# Patient Record
Sex: Female | Born: 1955
Health system: Southern US, Community
[De-identification: ages and names within clinical notes are randomized; demographics above are authoritative.]

## PROBLEM LIST (undated history)

## (undated) DIAGNOSIS — J40 Bronchitis, not specified as acute or chronic: Secondary | ICD-10-CM

## (undated) DIAGNOSIS — I10 Essential (primary) hypertension: Secondary | ICD-10-CM

## (undated) DIAGNOSIS — C3490 Malignant neoplasm of unspecified part of unspecified bronchus or lung: Secondary | ICD-10-CM

## (undated) DIAGNOSIS — M19042 Primary osteoarthritis, left hand: Secondary | ICD-10-CM

---

## 2001-12-25 ENCOUNTER — Ambulatory Visit (HOSPITAL_COMMUNITY): Admission: RE | Admit: 2001-12-25 | Discharge: 2001-12-25 | Payer: Self-pay | Admitting: Obstetrics and Gynecology

## 2001-12-25 ENCOUNTER — Encounter: Payer: Self-pay | Admitting: Obstetrics and Gynecology

## 2001-12-27 ENCOUNTER — Encounter: Admission: RE | Admit: 2001-12-27 | Discharge: 2001-12-27 | Payer: Self-pay | Admitting: Obstetrics and Gynecology

## 2001-12-27 ENCOUNTER — Encounter: Payer: Self-pay | Admitting: Obstetrics and Gynecology

## 2002-01-23 ENCOUNTER — Other Ambulatory Visit: Admission: RE | Admit: 2002-01-23 | Discharge: 2002-01-23 | Payer: Self-pay | Admitting: Obstetrics and Gynecology

## 2003-02-20 ENCOUNTER — Other Ambulatory Visit: Admission: RE | Admit: 2003-02-20 | Discharge: 2003-02-20 | Payer: Self-pay | Admitting: Obstetrics and Gynecology

## 2003-04-28 ENCOUNTER — Encounter: Admission: RE | Admit: 2003-04-28 | Discharge: 2003-04-28 | Payer: Self-pay | Admitting: Obstetrics and Gynecology

## 2004-06-16 ENCOUNTER — Other Ambulatory Visit: Admission: RE | Admit: 2004-06-16 | Discharge: 2004-06-16 | Payer: Self-pay | Admitting: Obstetrics and Gynecology

## 2004-06-17 ENCOUNTER — Ambulatory Visit: Payer: Self-pay | Admitting: Family Medicine

## 2004-06-24 ENCOUNTER — Ambulatory Visit: Payer: Self-pay | Admitting: Family Medicine

## 2004-08-06 ENCOUNTER — Encounter: Admission: RE | Admit: 2004-08-06 | Discharge: 2004-08-06 | Payer: Self-pay | Admitting: Obstetrics and Gynecology

## 2005-07-04 ENCOUNTER — Other Ambulatory Visit: Admission: RE | Admit: 2005-07-04 | Discharge: 2005-07-04 | Payer: Self-pay | Admitting: Obstetrics and Gynecology

## 2005-07-21 ENCOUNTER — Ambulatory Visit: Payer: Self-pay | Admitting: Cardiology

## 2005-08-02 ENCOUNTER — Ambulatory Visit: Payer: Self-pay | Admitting: Cardiology

## 2005-09-15 ENCOUNTER — Ambulatory Visit: Payer: Self-pay | Admitting: Cardiology

## 2005-11-15 ENCOUNTER — Ambulatory Visit: Payer: Self-pay | Admitting: Cardiology

## 2008-05-29 ENCOUNTER — Ambulatory Visit: Payer: Self-pay | Admitting: Cardiology

## 2008-05-29 LAB — CONVERTED CEMR LAB
ALT: 20 units/L (ref 0–35)
AST: 24 units/L (ref 0–37)
Albumin: 4 g/dL (ref 3.5–5.2)
Alkaline Phosphatase: 95 units/L (ref 39–117)
BUN: 13 mg/dL (ref 6–23)
Bilirubin, Direct: 0.1 mg/dL (ref 0.0–0.3)
CO2: 0 meq/L — CL (ref 19–32)
Calcium: 9.7 mg/dL (ref 8.4–10.5)
Chloride: 103 meq/L (ref 96–112)
Cholesterol: 266 mg/dL (ref 0–200)
Creatinine, Ser: 0.6 mg/dL (ref 0.4–1.2)
Direct LDL: 130.1 mg/dL
GFR calc Af Amer: 135 mL/min
GFR calc non Af Amer: 112 mL/min
Glucose, Bld: 98 mg/dL (ref 70–99)
HDL: 120.2 mg/dL (ref >39.0)
Potassium: 4.1 meq/L (ref 3.5–5.1)
Sodium: 140 meq/L (ref 135–145)
TSH: 1.07 microintl units/mL (ref 0.35–5.50)
Total Bilirubin: 0.6 mg/dL (ref 0.3–1.2)
Total CHOL/HDL Ratio: 2.2
Total Protein: 7.3 g/dL (ref 6.0–8.3)
Triglycerides: 70 mg/dL (ref 0–149)
VLDL: 14 mg/dL (ref 0–40)

## 2008-08-07 ENCOUNTER — Telehealth (INDEPENDENT_AMBULATORY_CARE_PROVIDER_SITE_OTHER): Payer: Self-pay | Admitting: *Deleted

## 2008-11-12 ENCOUNTER — Encounter: Payer: Self-pay | Admitting: Cardiology

## 2008-11-20 DIAGNOSIS — F17200 Nicotine dependence, unspecified, uncomplicated: Secondary | ICD-10-CM | POA: Insufficient documentation

## 2009-04-08 ENCOUNTER — Encounter (INDEPENDENT_AMBULATORY_CARE_PROVIDER_SITE_OTHER): Payer: Self-pay | Admitting: *Deleted

## 2009-04-08 DIAGNOSIS — E7800 Pure hypercholesterolemia, unspecified: Secondary | ICD-10-CM | POA: Insufficient documentation

## 2009-04-08 DIAGNOSIS — I10 Essential (primary) hypertension: Secondary | ICD-10-CM | POA: Insufficient documentation

## 2009-04-14 ENCOUNTER — Ambulatory Visit: Payer: Self-pay | Admitting: Cardiology

## 2010-05-25 NOTE — Progress Notes (Signed)
   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

## 2010-05-25 NOTE — Letter (Signed)
Summary: [facility] Note  [facility] Note   Imported By: Roderic Ovens 12/11/2008 12:10:18  _____________________________________________________________________  External Attachment:    Type:   Image     Comment:   External Document  Appended Document: [facility] Note  Reviewed Juanito Doom, MD

## 2010-09-07 NOTE — Assessment & Plan Note (Signed)
[facility]                            CARDIOLOGY OFFICE NOTE   [name], [name]                        MRN:          [id number]  DATE:05/29/2008                            DOB:          09/21/1955    [name] returns today for followup.   CANCELED DICTATION.     Thomas C. Daleen Squibb, MD, [facility]     TCW/MedQ  DD: 05/29/2008  DT: 05/30/2008  Job #: [id number]

## 2010-09-07 NOTE — Assessment & Plan Note (Signed)
[facility]                            CARDIOLOGY OFFICE NOTE   EDITH, GROLEAU                        MRN:          [id number]  DATE:05/29/2008                            DOB:          1955-12-01    Ms. Fujikawa returns to the office today for followup.  The last time I  saw her was on November 15, 2005.   She was seen initially by Dr. Arelia Sneddon at [facility] for  hypertension and cardiovascular risk factors.   She says that her blood pressure usually run around 120/80 by her  husband who checks it with a manual cuff.  Unfortunately every time she  comes in here and goes into the office, it is elevated.  I am concerned  that other stresses in her life are doing the same thing.   She continues to smoke about a half a pack of cigarettes a day.  She  does stay active walking and fishing.   She also has hyperlipidemia though her HDL is very high at 91.  Her  total cholesterol in the past has been 261, triglycerides were normal,  LDL 151.  With her total to HDL ratio being so low, I chose not to treat  her back in 2007.   He is currently on calcium each day, multivitamin daily, Norvasc 10 mg  per day.   She had an intolerance to HYDROCHLOROTHIAZIDE causing back pain and  cramping in the past.  This was combined with lisinopril which I doubt  was the culprit.  She may need this in the future.   PHYSICAL EXAMINATION:  VITAL SIGNS:  Her blood pressure is 170/102 in  the left arm.  I rechecked it and it was 160 both by auscultation and by  palpation.  Her diastolic is running about 80-90.  Her weight is 136.  Heart rate is 68.  EKG is normal.  HEENT:  Sclerae are injected.  She looks tired.  She has a tan from a  tanning bed.  Rest of her HEENT is unremarkable.  NECK:  Carotid upstrokes were equal bilaterally without bruits.  Thyroid  is not enlarged.  Trachea is midline.  NECK:  Supple.  CHEST:  Lungs are clear to auscultation and  percussion.  HEART:  Normal S1 and S2.  PMI is nondisplaced.  ABDOMEN:  Soft.  No midline bruit.  No pulsatile mass.  EXTREMITIES:  There were no cyanosis, clubbing, or edema.  Pulses are  present.  NEUROLOGIC:  Intact.  SKIN:  Unremarkable.   I have had a long talk with Ms. Scheiderer today.  I am very concerned that  she has labile hypertension and that everyday stresses are causing a  significant variation in her systolic pressure not to mention her  diastolic pressure.  She is on Norvasc 10 mg a day and seems to be  compliant.  She took it this morning.  I have also talked to her at  length about how nicotine will drive up her blood pressure acutely and  she really needs to stop smoking.  I spent at  least 5-10 minutes on  this.   We will repeat her lipids, TSH, as well as a CMP.  If her HDL is still  very high, she is blessed and we will not treat her.   She will call me if her blood pressure is running consistently above 130-  140 systolic or greater than 90 diastolic.  I will plan on seeing her  back again in a year.     Thomas C. Daleen Squibb, MD, [facility]  Electronically Signed    TCW/MedQ  DD: 05/29/2008  DT: 05/29/2008  Job #: [id number]   cc:   Juluis Mire, M.D.

## 2010-09-10 NOTE — Assessment & Plan Note (Signed)
[facility]                              CARDIOLOGY OFFICE NOTE   EMUNAH, TEXIDOR                        MRN:          [id number]  DATE:11/15/2005                            DOB:          16-Apr-1956    Ms. Verdis Frederickson returns today for management of her hypertension.  Her blood  pressure has been running about 130/80.  She just got back from [facility] for 2 weeks.  She has been smoking a little bit, but has cut way  back.   She is on Norvasc or amlodipine 10 mg p.o. q. day.  She was intolerant of  the diuretic because of back pain and cramping.  It was probably the  diuretic component.   PHYSICAL EXAMINATION:  Her blood pressure is 140/90, pulse is 63, weight is  139.  The rest of the exam is unchanged.  She has no edema.   I have asked Ms. Lipton to continue with the Norvasc 10 mg a day.  I wrote  her a prescription for a year's worth of refills.  She checks her blood  pressure, and I told her that her goal was 130/80.  If it starts to  increase, she is to let us know.  We will see her back in a year.                               Thomas C. Daleen Squibb, MD, [facility]    TCW/MedQ  DD:  11/15/2005  DT:  11/16/2005  Job #:  [id number]   cc:   Juluis Mire, MD

## 2011-01-20 ENCOUNTER — Other Ambulatory Visit: Payer: Self-pay | Admitting: *Deleted

## 2011-01-20 MED ORDER — AMLODIPINE BESYLATE 10 MG PO TABS: 10.0000 mg | ORAL_TABLET | Freq: Every day | ORAL | Status: DC

## 2011-02-23 ENCOUNTER — Other Ambulatory Visit: Payer: Self-pay | Admitting: Cardiology

## 2011-08-07 ENCOUNTER — Emergency Department (HOSPITAL_BASED_OUTPATIENT_CLINIC_OR_DEPARTMENT_OTHER)
Admission: EM | Admit: 2011-08-07 | Discharge: 2011-08-07 | Disposition: A | Discharge status: Home / Self Care | Payer: BC Managed Care – PPO | Attending: Emergency Medicine | Admitting: Emergency Medicine

## 2011-08-07 ENCOUNTER — Encounter (HOSPITAL_BASED_OUTPATIENT_CLINIC_OR_DEPARTMENT_OTHER): Payer: Self-pay | Admitting: *Deleted

## 2011-08-07 DIAGNOSIS — J029 Acute pharyngitis, unspecified: Secondary | ICD-10-CM

## 2011-08-07 DIAGNOSIS — I10 Essential (primary) hypertension: Secondary | ICD-10-CM | POA: Insufficient documentation

## 2011-08-07 DIAGNOSIS — R070 Pain in throat: Secondary | ICD-10-CM | POA: Insufficient documentation

## 2011-08-07 DIAGNOSIS — F17200 Nicotine dependence, unspecified, uncomplicated: Secondary | ICD-10-CM | POA: Insufficient documentation

## 2011-08-07 HISTORY — DX: Essential (primary) hypertension: I10

## 2011-08-07 LAB — RAPID STREP SCREEN (MED CTR MEBANE ONLY): Streptococcus, Group A Screen (Direct): NEGATIVE

## 2011-08-07 MED ORDER — OXYCODONE-ACETAMINOPHEN 5-325 MG PO TABS
1.0000 | ORAL_TABLET | ORAL | Status: AC | PRN
Start: 2011-08-07 — End: 2011-08-17

## 2011-08-07 NOTE — Discharge Instructions (Signed)
Antibiotic Nonuse  Your caregiver felt that the infection or problem was not one that would be helped with an antibiotic. Infections may be caused by viruses or bacteria. Only a caregiver can tell which one of these is the likely cause of an illness. A cold is the most common cause of infection in both adults and children. A cold is a virus. Antibiotic treatment will have no effect on a viral infection. Viruses can lead to many lost days of work caring for sick children and many missed days of school. Children may catch as many as 10 "colds" or "flus" per year during which they can be tearful, cranky, and uncomfortable. The goal of treating a virus is aimed at keeping the ill person comfortable. Antibiotics are medications used to help the body fight bacterial infections. There are relatively few types of bacteria that cause infections but there are hundreds of viruses. While both viruses and bacteria cause infection they are very different types of germs. A viral infection will typically go away by itself within 7 to 10 days. Bacterial infections may spread or get worse without antibiotic treatment. Examples of bacterial infections are:  Sore throats (like strep throat or tonsillitis).   Infection in the lung (pneumonia).   Ear and skin infections.  Examples of viral infections are:  Colds or flus.   Most coughs and bronchitis.   Sore throats not caused by Strep.   Runny noses.  It is often best not to take an antibiotic when a viral infection is the cause of the problem. Antibiotics can kill off the helpful bacteria that we have inside our body and allow harmful bacteria to start growing. Antibiotics can cause side effects such as allergies, nausea, and diarrhea without helping to improve the symptoms of the viral infection. Additionally, repeated uses of antibiotics can cause bacteria inside of our body to become resistant. That resistance can be passed onto harmful bacterial. The next time  you have an infection it may be harder to treat if antibiotics are used when they are not needed. Not treating with antibiotics allows our own immune system to develop and take care of infections more efficiently. Also, antibiotics will work better for [place] when they are prescribed for bacterial infections. Treatments for a child that is ill may include:  Give extra fluids throughout the day to stay hydrated.   Get plenty of rest.   Only give your child over-the-counter or prescription medicines for pain, discomfort, or fever as directed by your caregiver.   The use of a cool mist humidifier may help stuffy noses.   Cold medications if suggested by your caregiver.  Your caregiver may decide to start you on an antibiotic if:  The problem you were seen for today continues for a longer length of time than expected.   You develop a secondary bacterial infection.  SEEK MEDICAL CARE IF:  Fever lasts longer than 5 days.   Symptoms continue to get worse after 5 to 7 days or become severe.   Difficulty in breathing develops.   Signs of dehydration develop (poor drinking, rare urinating, dark colored urine).   Changes in behavior or worsening tiredness (listlessness or lethargy).  Document Released: 06/20/2001 Document Revised: 03/31/2011 Document Reviewed: 12/17/2008 [facility] Patient Information 2012 [place], [place].Sore Throat Sore throats may be caused by bacteria and viruses. They may also be caused by:  Smoking.   Pollution.   Allergies.  If a sore throat is due to strep infection (a bacterial infection),  you may need:  A throat swab.   A culture test to verify the strep infection.  You will need one of these:  An antibiotic shot.   Oral medicine for a full 10 days.  Strep infection is very contagious. A doctor should check any close contacts who have a sore throat or fever. A sore throat caused by a virus infection will usually last only 3-4 days. Antibiotics will not treat a  viral sore throat.  Infectious mononucleosis (a viral disease), however, can cause a sore throat that lasts for up to 3 weeks. Mononucleosis can be diagnosed with blood tests. You must have been sick for at least 1 week in order for the test to give accurate results. HOME CARE INSTRUCTIONS   To treat a sore throat, take mild pain medicine.   Increase your fluids.   Eat a soft diet.   Do not smoke.   Gargling with warm water or salt water (1 tsp. salt in 8 oz. water) can be helpful.   Try throat sprays or lozenges or sucking on hard candy to ease the symptoms.  Call your doctor if your sore throat lasts longer than 1 week.  SEEK IMMEDIATE MEDICAL CARE IF:  You have difficulty breathing.   You have increased swelling in the throat.   You have pain so severe that you are unable to swallow fluids or your saliva.   You have a severe headache, a high fever, vomiting, or a red rash.  Document Released: 05/19/2004 Document Revised: 03/31/2011 Document Reviewed: 03/29/2007 [facility] Patient Information 2012 [place], [place].

## 2011-08-07 NOTE — ED Notes (Signed)
Patient states that she has had a sore throat since last night.

## 2011-08-07 NOTE — ED Provider Notes (Signed)
History     CSN: [id number]  Arrival date & time 08/07/11  5284   First MD Initiated Contact with Patient 08/07/11 [phone]      Chief Complaint  Patient presents with  . Sore Throat    (Consider location/radiation/quality/duration/timing/severity/associated sxs/prior treatment) HPI Comments: Patient with sore throat for several days.  Subjective fever yesterday.  Some congest she thinks is secondary to seasonal allergies.  No cough, vomiting or diarrhea.    Patient is a 56 y.o. female presenting with pharyngitis. The history is provided by the patient.  Sore Throat This is a new problem. The current episode started 2 days ago. The problem occurs constantly. The problem has not changed since onset.Pertinent negatives include no chest pain, no abdominal pain, no headaches and no shortness of breath. The symptoms are aggravated by swallowing. The symptoms are relieved by nothing. The treatment provided no relief.    Past Medical History  Diagnosis Date  . Hypertension     History reviewed. No pertinent past surgical history.  No family history on file.  History  Substance Use Topics  . Smoking status: Current Everyday Smoker  . Smokeless tobacco: Not on file  . Alcohol Use:     OB History    Grav Para Term Preterm Abortions TAB SAB Ect Mult Living                  Review of Systems  Respiratory: Negative for shortness of breath.   Cardiovascular: Negative for chest pain.  Gastrointestinal: Negative for abdominal pain.  Neurological: Negative for headaches.  All other systems reviewed and are negative.    Allergies  Codeine; Hydrochlorothiazide; and Lisinopril  Home Medications   Current Outpatient Rx  Name Route Sig Dispense Refill  . AMLODIPINE BESYLATE 10 MG PO TABS  TAKE 1 TABLET (10 MG TOTAL) BY MOUTH DAILY. 30 tablet 11    BP 156/89  Pulse 84  Temp(Src) 98.3 F (36.8 C) (Oral)  Resp 18  SpO2 98%  Physical Exam  Nursing note and vitals  reviewed. Constitutional: She is oriented to person, place, and time. She appears well-developed and well-nourished.  HENT:  Head: Normocephalic and atraumatic.  Right Ear: External ear normal.  Left Ear: External ear normal.  Nose: Nose normal.       Moderate erythema of pharynx, no swelling or exudate  Eyes: Conjunctivae and EOM are normal. Pupils are equal, round, and reactive to light.  Neck: Normal range of motion. Neck supple.  Cardiovascular: Normal rate, regular rhythm, normal heart sounds and intact distal pulses.   Pulmonary/Chest: Effort normal and breath sounds normal.  Abdominal: Soft. Bowel sounds are normal.  Musculoskeletal: Normal range of motion.  Neurological: She is alert and oriented to person, place, and time. She has normal reflexes.  Skin: Skin is warm and dry.  Psychiatric: She has a normal mood and affect. Her behavior is normal. Thought content normal.    ED Course  Procedures (including critical care time)   Labs Reviewed  RAPID STREP SCREEN   No results found.   No diagnosis found.    MDM  Strep negative.  Patient states she can take percocet.  She will be given rx for strep-discussed that she is able to take percocet but not codeine.       Hilario Quarry, MD 08/07/11 [phone]

## 2012-04-13 ENCOUNTER — Other Ambulatory Visit: Payer: Self-pay

## 2012-05-19 ENCOUNTER — Emergency Department (HOSPITAL_BASED_OUTPATIENT_CLINIC_OR_DEPARTMENT_OTHER)
Admission: EM | Admit: 2012-05-19 | Discharge: 2012-05-19 | Disposition: A | Discharge status: Home / Self Care | Payer: BC Managed Care – PPO | Attending: Emergency Medicine | Admitting: Emergency Medicine

## 2012-05-19 ENCOUNTER — Encounter (HOSPITAL_BASED_OUTPATIENT_CLINIC_OR_DEPARTMENT_OTHER): Payer: Self-pay | Admitting: *Deleted

## 2012-05-19 ENCOUNTER — Emergency Department (HOSPITAL_BASED_OUTPATIENT_CLINIC_OR_DEPARTMENT_OTHER): Payer: BC Managed Care – PPO

## 2012-05-19 DIAGNOSIS — Z79899 Other long term (current) drug therapy: Secondary | ICD-10-CM | POA: Insufficient documentation

## 2012-05-19 DIAGNOSIS — M7918 Myalgia, other site: Secondary | ICD-10-CM

## 2012-05-19 DIAGNOSIS — Y9389 Activity, other specified: Secondary | ICD-10-CM | POA: Insufficient documentation

## 2012-05-19 DIAGNOSIS — S298XXA Other specified injuries of thorax, initial encounter: Secondary | ICD-10-CM | POA: Insufficient documentation

## 2012-05-19 DIAGNOSIS — I10 Essential (primary) hypertension: Secondary | ICD-10-CM | POA: Insufficient documentation

## 2012-05-19 DIAGNOSIS — F17200 Nicotine dependence, unspecified, uncomplicated: Secondary | ICD-10-CM | POA: Insufficient documentation

## 2012-05-19 DIAGNOSIS — IMO0002 Reserved for concepts with insufficient information to code with codable children: Secondary | ICD-10-CM | POA: Insufficient documentation

## 2012-05-19 DIAGNOSIS — Y929 Unspecified place or not applicable: Secondary | ICD-10-CM | POA: Insufficient documentation

## 2012-05-19 MED ORDER — DIAZEPAM 5 MG PO TABS: 5.0000 mg | ORAL_TABLET | Freq: Two times a day (BID) | ORAL | Status: DC

## 2012-05-19 MED ORDER — CYCLOBENZAPRINE HCL 10 MG PO TABS: 10.0000 mg | ORAL_TABLET | Freq: Two times a day (BID) | ORAL | Status: DC | PRN

## 2012-05-19 MED ORDER — KETOROLAC TROMETHAMINE 60 MG/2ML IM SOLN
60.0000 mg | Freq: Once | INTRAMUSCULAR | Status: AC
  Administered 2012-05-19: 60 mg via INTRAMUSCULAR
  Filled 2012-05-19: qty 2

## 2012-05-19 NOTE — ED Provider Notes (Signed)
History/physical exam/procedure(s) were performed by non-physician practitioner and as supervising physician I was immediately available for consultation/collaboration. I have reviewed all notes and am in agreement with care and plan.   Elo Marmolejos S Nell Schrack, MD 05/19/12 1509 

## 2012-05-19 NOTE — ED Notes (Signed)
Patient transported to X-ray 

## 2012-05-19 NOTE — ED Notes (Signed)
Patient fell Tuesday and hit the trash can catching her in the ribs in her right side. Not feeling any better.

## 2012-05-19 NOTE — ED Provider Notes (Signed)
History     CSN: [id number]  Arrival date & time 05/19/12  1149   First MD Initiated Contact with Patient 05/19/12 1205      Chief Complaint  Patient presents with  . Muscle Pain    (Consider location/radiation/quality/duration/timing/severity/associated sxs/prior treatment) HPI Comments: Patient is a 57 year old female who presents with right rib pain for the past 5 days. The pain started suddenly after leaning into an industrial trash can to get something she accidentally threw away. She felt her right ribs "catch" on the edge of the trash can. She reports aching and severe pain that does not radiate. She has tried ibuprofen for pain which does no provide any relief. Pain is worse with movement and palpation. No associated SOB or chest pain.    Past Medical History  Diagnosis Date  . Hypertension     History reviewed. No pertinent past surgical history.  No family history on file.  History  Substance Use Topics  . Smoking status: Current Every Day Smoker  . Smokeless tobacco: Not on file  . Alcohol Use: No    OB History    Grav Para Term Preterm Abortions TAB SAB Ect Mult Living                  Review of Systems  Musculoskeletal: Positive for myalgias.  All other systems reviewed and are negative.    Allergies  Codeine; Hydrochlorothiazide; and Lisinopril  Home Medications   Current Outpatient Rx  Name  Route  Sig  Dispense  Refill  . AMLODIPINE BESYLATE 10 MG PO TABS      TAKE 1 TABLET (10 MG TOTAL) BY MOUTH DAILY.   30 tablet   11     BP 161/102  Pulse 73  Temp 98.1 F (36.7 C) (Oral)  Resp 18  Ht 5\' 6"  (1.676 m)  Wt 140 lb (63.504 kg)  BMI 22.60 kg/m2  SpO2 100%  Physical Exam  Nursing note and vitals reviewed. Constitutional: She is oriented to person, place, and time. She appears well-developed and well-nourished. No distress.  HENT:  Head: Normocephalic and atraumatic.  Eyes: Conjunctivae normal and EOM are normal. Pupils are equal,  round, and reactive to light.  Neck: Normal range of motion. Neck supple.  Cardiovascular: Normal rate and regular rhythm.  Exam reveals no gallop and no friction rub.   No murmur heard. Pulmonary/Chest: Effort normal and breath sounds normal. She has no wheezes. She has no rales. She exhibits no tenderness.  Abdominal: Soft. There is no tenderness.  Musculoskeletal: Normal range of motion.       Tender to palpation of anterior right lower ribs.   Neurological: She is alert and oriented to person, place, and time.       Speech is goal-oriented. Moves limbs without ataxia.   Skin: Skin is warm and dry.  Psychiatric: She has a normal mood and affect. Her behavior is normal.    ED Course  Procedures (including critical care time)  Labs Reviewed - No data to display Dg Ribs Unilateral W/chest Right  05/19/2012  *RADIOLOGY REPORT*  Clinical Data: Pain post fall  RIGHT RIBS AND CHEST - 3+ VIEW  Comparison: None.  Findings: Three views right ribs submitted.  The cardiomediastinal silhouette is unremarkable.  No acute infiltrate or pulmonary edema.  No right rib fracture is identified.  No diagnostic pneumothorax.  IMPRESSION: .  No right rib fracture.  No diagnostic pneumothorax.   Original Report Authenticated By: Lang Snow  Pop, M.D.      1. Musculoskeletal pain       MDM  12:42 PM Chest xray pending. Patient will have toradol for pain here.   1:41 PM Xray unremarkale. Patient likely has musculoskeletal pain. I will discharge her with Flexeril and valium. No further evaluation needed at this time. Vitals stable for discharge.     Emilia Beck, PA-C 05/19/12 1431

## 2013-01-07 ENCOUNTER — Encounter (HOSPITAL_BASED_OUTPATIENT_CLINIC_OR_DEPARTMENT_OTHER): Payer: Self-pay | Admitting: *Deleted

## 2013-01-07 ENCOUNTER — Emergency Department (HOSPITAL_BASED_OUTPATIENT_CLINIC_OR_DEPARTMENT_OTHER)
Admission: EM | Admit: 2013-01-07 | Discharge: 2013-01-07 | Disposition: A | Discharge status: Home / Self Care | Payer: BC Managed Care – PPO | Attending: Emergency Medicine | Admitting: Emergency Medicine

## 2013-01-07 ENCOUNTER — Emergency Department (HOSPITAL_BASED_OUTPATIENT_CLINIC_OR_DEPARTMENT_OTHER): Payer: BC Managed Care – PPO

## 2013-01-07 DIAGNOSIS — I10 Essential (primary) hypertension: Secondary | ICD-10-CM | POA: Insufficient documentation

## 2013-01-07 DIAGNOSIS — R079 Chest pain, unspecified: Secondary | ICD-10-CM

## 2013-01-07 DIAGNOSIS — R0789 Other chest pain: Secondary | ICD-10-CM | POA: Insufficient documentation

## 2013-01-07 DIAGNOSIS — Z79899 Other long term (current) drug therapy: Secondary | ICD-10-CM | POA: Insufficient documentation

## 2013-01-07 DIAGNOSIS — R0602 Shortness of breath: Secondary | ICD-10-CM | POA: Insufficient documentation

## 2013-01-07 DIAGNOSIS — F17200 Nicotine dependence, unspecified, uncomplicated: Secondary | ICD-10-CM | POA: Insufficient documentation

## 2013-01-07 LAB — CBC WITH DIFFERENTIAL/PLATELET
Basophils Absolute: 0 10*3/uL (ref 0.0–0.1)
Basophils Relative: 0 % (ref 0–1)
Eosinophils Absolute: 0 10*3/uL (ref 0.0–0.7)
Eosinophils Relative: 0 % (ref 0–5)
HCT: 44.6 % (ref 36.0–46.0)
Hemoglobin: 15.5 g/dL — ABNORMAL HIGH (ref 12.0–15.0)
Lymphocytes Relative: 27 % (ref 12–46)
Lymphs Abs: 2.1 10*3/uL (ref 0.7–4.0)
MCH: 32.7 pg (ref 26.0–34.0)
MCHC: 34.8 g/dL (ref 30.0–36.0)
MCV: 94.1 fL (ref 78.0–100.0)
Monocytes Absolute: 0.5 10*3/uL (ref 0.1–1.0)
Monocytes Relative: 7 % (ref 3–12)
Neutro Abs: 4.9 10*3/uL (ref 1.7–7.7)
Neutrophils Relative %: 65 % (ref 43–77)
Platelets: 269 10*3/uL (ref 150–400)
RBC: 4.74 MIL/uL (ref 3.87–5.11)
RDW: 12.1 % (ref 11.5–15.5)
WBC: 7.6 10*3/uL (ref 4.0–10.5)

## 2013-01-07 LAB — BASIC METABOLIC PANEL
BUN: 12 mg/dL (ref 6–23)
CO2: 27 mEq/L (ref 19–32)
Calcium: 10.2 mg/dL (ref 8.4–10.5)
Chloride: 102 mEq/L (ref 96–112)
Creatinine, Ser: 0.6 mg/dL (ref 0.50–1.10)
GFR calc Af Amer: >90 mL/min (ref >90)
GFR calc non Af Amer: >90 mL/min (ref >90)
Glucose, Bld: 98 mg/dL (ref 70–99)
Potassium: 4.2 mEq/L (ref 3.5–5.1)
Sodium: 141 mEq/L (ref 135–145)

## 2013-01-07 LAB — TROPONIN I
Troponin I: <0.3 ng/mL (ref <0.30)
Troponin I: <0.3 ng/mL (ref <0.30)

## 2013-01-07 LAB — D-DIMER, QUANTITATIVE: D-Dimer, Quant: <0.27 ug/mL-FEU (ref 0.00–0.48)

## 2013-01-07 NOTE — ED Provider Notes (Signed)
CSN: [id number]     Arrival date & time 01/07/13  0900 History   First MD Initiated Contact with Patient 01/07/13 0913     Chief Complaint  Patient presents with  . chest heaviness x 2 weeks   . Shortness of Breath   (Consider location/radiation/quality/duration/timing/severity/associated sxs/prior Treatment) HPI Comments: Pt states that she has had left sided chest pressure and sob over the last 3 weeks with it worsening in the last couple of VHQ:IONGEXBM are worse with movement and any type of activity:pt has not taken anything for the sensation:denies n/v/d, fever, cough, diaphoresis:pt states that she has not been taking her bp in the last 10 days because she has been traveling:pt states that she saw cardiology a couple of years ago for sob and nothing was found:pt states that this is very different then the last episode  The history is provided by the patient. No language interpreter was used.    Past Medical History  Diagnosis Date  . Hypertension    History reviewed. No pertinent past surgical history. History reviewed. No pertinent family history. History  Substance Use Topics  . Smoking status: Current Every Day Smoker  . Smokeless tobacco: Not on file  . Alcohol Use: 1.2 oz/week    2 Cans of beer per week     Comment: 2 beers per day   OB History   Grav Para Term Preterm Abortions TAB SAB Ect Mult Living                 Review of Systems  Constitutional: Negative.   Respiratory: Negative.     Allergies  Codeine; Hydrochlorothiazide; and Lisinopril  Home Medications   Current Outpatient Rx  Name  Route  Sig  Dispense  Refill  . amLODipine (NORVASC) 10 MG tablet      TAKE 1 TABLET (10 MG TOTAL) BY MOUTH DAILY.   30 tablet   11   . cyclobenzaprine (FLEXERIL) 10 MG tablet   Oral   Take 1 tablet (10 mg total) by mouth 2 (two) times daily as needed for muscle spasms.   20 tablet   0   . diazepam (VALIUM) 5 MG tablet   Oral   Take 1 tablet (5 mg total) by  mouth 2 (two) times daily.   10 tablet   0    BP 180/87  Pulse 72  Temp(Src) 98 F (36.7 C) (Oral)  Resp 20  SpO2 98% Physical Exam  Nursing note and vitals reviewed. Constitutional: She is oriented to person, place, and time. She appears well-developed and well-nourished.  HENT:  Head: Normocephalic and atraumatic.  Eyes: Conjunctivae and EOM are normal.  Neck: Neck supple.  Cardiovascular: Normal rate and regular rhythm.   Pulmonary/Chest: Effort normal and breath sounds normal. She exhibits tenderness.  Abdominal: Soft. Bowel sounds are normal.  Musculoskeletal: Normal range of motion.  Neurological: She is alert and oriented to person, place, and time.  Skin: Skin is warm and dry.  Psychiatric: She has a normal mood and affect.    ED Course  Procedures (including critical care time) Labs Review Labs Reviewed  CBC WITH DIFFERENTIAL - Abnormal; Notable for the following:    Hemoglobin 15.5 (*)    All other components within normal limits  BASIC METABOLIC PANEL  TROPONIN I  D-DIMER, QUANTITATIVE    Date: 01/07/2013  Rate: 65  Rhythm: normal sinus rhythm  QRS Axis: normal  Intervals: normal  ST/T Wave abnormalities: normal  Conduction Disutrbances: none  Narrative Interpretation:   Old EKG Reviewed: no old ekg    Imaging Review Dg Chest 2 View  01/07/2013   *RADIOLOGY REPORT*  Clinical Data: Chest pain and shortness of breath  CHEST - 2 VIEW  Comparison: 05/19/2012  Findings: Chronically prominent interstitial lung markings compatible with emphysema again noted, with hyperinflation.  Heart size is normal.  No pleural effusion.  No pneumothorax.  No acute osseous finding.  IMPRESSION: Hyperinflation suggestive of emphysema again noted.  No acute cardiopulmonary process.   Original Report Authenticated By: Christiana Pellant, M.D.    MDM   1. Chest pain   2. Hypertension    Second set of cardiac markers normal:pt has appointment with [facility] in 4 days:pr is  comfortable at this time:pt instructed to continue her bp medication    Teressa Lower, NP 01/07/13 1410

## 2013-01-07 NOTE — ED Provider Notes (Signed)
[place] of anterior chest pressure for 2 weeks, constant. Worse with exertion, improved with rest, pleuritic in nature, discomfort is minimal at present. Symptoms accompanied by shortness of breath no nausea no sweatiness presently discomfort is minimal. Cardiac risk factors hypertension smoking, father had open heart surgery in his 26s. Eye exam no distress lungs clear auscultation heart regular rate and rhythm abdomen nondistended nontender extremities no edema.  Doug Sou, MD 01/07/13 1510

## 2013-01-07 NOTE — ED Notes (Signed)
Pt reports 2 week history of chest feeling heavy in the last 3-4 days has also been having shortness of breath that gets worse with activity.

## 2013-01-07 NOTE — ED Provider Notes (Signed)
Medical screening examination/treatment/procedure(s) were conducted as a shared visit with non-physician practitioner(s) and myself.  I personally evaluated the patient during the encounter  Doug Sou, MD 01/07/13 1511

## 2013-01-11 ENCOUNTER — Encounter: Payer: Self-pay | Admitting: Internal Medicine

## 2013-01-11 ENCOUNTER — Ambulatory Visit (INDEPENDENT_AMBULATORY_CARE_PROVIDER_SITE_OTHER): Payer: BC Managed Care – PPO | Admitting: Internal Medicine

## 2013-01-11 VITALS — BP 168/96 | HR 69 | Ht 66.0 in | Wt 141.2 lb

## 2013-01-11 DIAGNOSIS — R079 Chest pain, unspecified: Secondary | ICD-10-CM

## 2013-01-11 DIAGNOSIS — F17200 Nicotine dependence, unspecified, uncomplicated: Secondary | ICD-10-CM

## 2013-01-11 DIAGNOSIS — R0989 Other specified symptoms and signs involving the circulatory and respiratory systems: Secondary | ICD-10-CM

## 2013-01-11 DIAGNOSIS — R0600 Dyspnea, unspecified: Secondary | ICD-10-CM

## 2013-01-11 DIAGNOSIS — O162 Unspecified maternal hypertension, second trimester: Secondary | ICD-10-CM

## 2013-01-11 DIAGNOSIS — Z789 Other specified health status: Secondary | ICD-10-CM

## 2013-01-11 DIAGNOSIS — Z7289 Other problems related to lifestyle: Secondary | ICD-10-CM

## 2013-01-11 DIAGNOSIS — I10 Essential (primary) hypertension: Secondary | ICD-10-CM

## 2013-01-11 DIAGNOSIS — E7800 Pure hypercholesterolemia, unspecified: Secondary | ICD-10-CM

## 2013-01-11 DIAGNOSIS — R0609 Other forms of dyspnea: Secondary | ICD-10-CM

## 2013-01-11 MED ORDER — IRBESARTAN-HYDROCHLOROTHIAZIDE 150-12.5 MG PO TABS: 1.0000 | ORAL_TABLET | Freq: Every day | ORAL | Status: DC

## 2013-01-11 NOTE — Progress Notes (Signed)
OFFICE NOTE  Chief Complaint:  Chest pain, DOE, HTN  Primary Care Physician: Judy Mire, MD  HPI:  [name] is a pleasant 57 year old female with little past medical history other than hypertension. She was seen by [name] in 2010 and placed on Norvasc. This is generally worked well with her blood pressure control recently her blood pressures were noted to be much higher in the 160s systolic. Over the past few weeks she's been under increasing amount of stress and has noted increased shortness of breath with exertion that is relieved at rest. This is associated with chest heaviness and prompted an emergency department visit. In the emergency department she ruled out for MI and was encouraged to followup with a cardiologist. Since [name] is not seeing general cardiology patient's at this time, she presented to our practice. Of note, there is a strong family history of heart disease with her father who has heart disease, however the onset was in his 63s and he had bypass surgery 4 years ago. She also is a smoker but just a few cigarettes a day.  Her shortness of breath has become significant enough that it is difficult for her to walk several hundred feet without becoming short of breath.  PMHx:  Past Medical History  Diagnosis Date  . Hypertension     History reviewed. No pertinent past surgical history.  FAMHx:  Family History  Problem Relation Age of Onset  . CAD Father     CABG  . Cancer Brother     lymphoma    SOCHx:   reports that she has been smoking.  She has never used smokeless tobacco. She reports that she does not drink alcohol or use illicit drugs.  ALLERGIES:  Allergies  Allergen Reactions  . Codeine     ROS: A comprehensive review of systems was negative except for: Respiratory: positive for dyspnea on exertion Cardiovascular: positive for chest pain  HOME MEDS: Current Outpatient Prescriptions  Medication Sig Dispense Refill  . amLODipine  (NORVASC) 10 MG tablet TAKE 1 TABLET (10 MG TOTAL) BY MOUTH DAILY.  30 tablet  11  . aspirin 81 MG tablet Take 81 mg by mouth daily.      . irbesartan-hydrochlorothiazide (AVALIDE) 150-12.5 MG per tablet Take 1 tablet by mouth daily.  30 tablet  11   No current facility-administered medications for this visit.    LABS/IMAGING: No results found for this or any previous visit (from the past 48 hour(s)). No results found.  VITALS: BP 168/96  Pulse 69  Ht 5\' 6"  (1.676 m)  Wt 141 lb 3.2 oz (64.048 kg)  BMI 22.8 kg/m2  EXAM: General appearance: alert and no distress Neck: no adenopathy, no carotid bruit, no JVD, supple, symmetrical, trachea midline and thyroid not enlarged, symmetric, no tenderness/mass/nodules Lungs: clear to auscultation bilaterally Heart: regular rate and rhythm, S1, S2 normal, no murmur, click, rub or gallop Abdomen: soft, non-tender; bowel sounds normal; no masses,  no organomegaly Extremities: extremities normal, atraumatic, no cyanosis or edema Pulses: 2+ and symmetric Skin: Skin color, texture, turgor normal. No rashes or lesions Neurologic: Grossly normal  EKG: Normal sinus rhythm at 69, no ischemic changes  ASSESSMENT: 1. Chest heaviness and dyspnea on exertion, concerning for unstable angina 2. Hypertension-uncontrolled 3. Family history of coronary disease 4. Unable to exercise on treadmill  PLAN: 1.   [name] has been having pressure in her chest and shortness of breath with exertion over the past several weeks. This  is limited her activities and is entirely new for her. She's also been under a lot more stress which may be contributing. Her blood pressure is uncontrolled and I think we should work on that first. I recommend we start her on a low-dose ARB/HCTZ combination. She can continue this in addition to amlodipine. I would also recommend starting low-dose aspirin 81 mg daily. In addition I will schedule her for a LexiScan nuclear stress test, as  she is unable to exercise on a treadmill due to marked exertional dyspnea.  I've asked to keep track of her blood pressures on a daily basis and bring the record. We'll plan to see her back in a few weeks to discuss the results of her stress test.  Judy Nose, MD, [facility] Attending Cardiologist [facility]  [name] 01/11/2013, 3:24 PM

## 2013-01-11 NOTE — Patient Instructions (Addendum)
Start Irbesartan-HCT 150-12.5mg  daily.  Start aspirin 81mg  daily.  Your physician has requested that you have a lexiscan myoview. For further information please visit [email or web address] Please follow instruction sheet, as given.  Please schedule a follow up visit with Dr. Rennis Golden after your stress test.

## 2013-01-16 ENCOUNTER — Telehealth: Payer: Self-pay | Admitting: Internal Medicine

## 2013-01-16 NOTE — Telephone Encounter (Signed)
Returned call.  Pt stated Dr. Rennis Golden rx'd her a new BP med last Friday.  Stated she has been taking it since then and it is not working.  Stated it has been making her back hurt bad.  Stated she stopped taking it this morning and took Norvasc.  Stated she hasn't had any pain w/o taking the new med.  Pt wants to know if she should continue taking Norvasc or if she needs something else.  Stated her previous doctor rx'd the same medication and she didn't realize it, but she had the exact same SEs.  Stated the pain comes within a hour or so of taking it and takes several hours to go away.  Pt informed Dr. Rennis Golden will be notified for further instructions.  Pt verbalized understanding and agreed w/ plan.  Message forwarded to Dr. Rennis Golden.

## 2013-01-16 NOTE — Telephone Encounter (Signed)
Patient has question about the new blood pressure medication that she was given on Friday of last week.

## 2013-01-20 NOTE — Telephone Encounter (Signed)
The blood pressure medication does not cause back pain. We could consider another blood pressure medicine in addition to Norvasc. Have her take Norvasc for now, get her stress test and we'll discuss an alternative when she comes back to discuss the stress test results.  -Dr. Rennis Golden

## 2013-01-21 NOTE — Telephone Encounter (Signed)
Returned call.  Left message w/ advice on pt-identified voicemail and to call back before 4pm if questions.

## 2013-01-22 ENCOUNTER — Ambulatory Visit (HOSPITAL_COMMUNITY)
Admission: RE | Admit: 2013-01-22 | Discharge: 2013-01-22 | Disposition: A | Discharge status: Home / Self Care | Payer: BC Managed Care – PPO | Source: Ambulatory Visit | Attending: Internal Medicine | Admitting: Internal Medicine

## 2013-01-22 ENCOUNTER — Other Ambulatory Visit: Payer: Self-pay | Admitting: *Deleted

## 2013-01-22 DIAGNOSIS — R0609 Other forms of dyspnea: Secondary | ICD-10-CM | POA: Insufficient documentation

## 2013-01-22 DIAGNOSIS — R0600 Dyspnea, unspecified: Secondary | ICD-10-CM

## 2013-01-22 DIAGNOSIS — R079 Chest pain, unspecified: Secondary | ICD-10-CM | POA: Insufficient documentation

## 2013-01-22 DIAGNOSIS — I10 Essential (primary) hypertension: Secondary | ICD-10-CM | POA: Insufficient documentation

## 2013-01-22 DIAGNOSIS — R0989 Other specified symptoms and signs involving the circulatory and respiratory systems: Secondary | ICD-10-CM | POA: Insufficient documentation

## 2013-01-22 DIAGNOSIS — Z789 Other specified health status: Secondary | ICD-10-CM

## 2013-01-22 MED ORDER — REGADENOSON 0.4 MG/5ML IV SOLN: 0.4000 mg | Freq: Once | INTRAVENOUS | Status: DC

## 2013-01-22 MED ORDER — REGADENOSON 0.4 MG/5ML IV SOLN
0.4000 mg | Freq: Once | INTRAVENOUS | Status: AC
  Administered 2013-01-22: 0.4 mg via INTRAVENOUS

## 2013-01-22 MED ORDER — AMINOPHYLLINE 25 MG/ML IV SOLN
125.0000 mg | Freq: Once | INTRAVENOUS | Status: AC
  Administered 2013-01-22: 125 mg via INTRAVENOUS

## 2013-01-22 MED ORDER — AMLODIPINE BESYLATE 10 MG PO TABS: ORAL_TABLET | ORAL | Status: DC

## 2013-01-22 MED ORDER — TECHNETIUM TC 99M SESTAMIBI GENERIC - CARDIOLITE: 30.0000 | Freq: Once | INTRAVENOUS | Status: AC | PRN

## 2013-01-22 MED ORDER — TECHNETIUM TC 99M SESTAMIBI GENERIC - CARDIOLITE
31.0000 | Freq: Once | INTRAVENOUS | Status: AC | PRN
  Administered 2013-01-22: 31 via INTRAVENOUS

## 2013-01-22 MED ORDER — TECHNETIUM TC 99M SESTAMIBI GENERIC - CARDIOLITE
10.5000 | Freq: Once | INTRAVENOUS | Status: AC | PRN
  Administered 2013-01-22: 11 via INTRAVENOUS

## 2013-01-22 MED ORDER — TECHNETIUM TC 99M SESTAMIBI GENERIC - CARDIOLITE: 10.0000 | Freq: Once | INTRAVENOUS | Status: AC | PRN

## 2013-01-22 NOTE — Procedures (Addendum)
[facility] [address] [place] [address] [phone]  Cardiology Nuclear Med Study  [name] is [name] 57 y.o. female     MRN : [id number]     DOB: 1955/05/16  Procedure Date: 01/22/2013  Nuclear Med Background Indication for Stress Test:  Evaluation for Ischemia History:  No prior history reported. Cardiac Risk Factors: Family History - CAD, Hypertension and Smoker  Symptoms:  Chest Pain and DOE   Nuclear Pre-Procedure Caffeine/Decaff Intake:  7:00pm NPO After: 5:00am   IV Site: R Hand  IV 0.9% NS with Angio Cath:  22g  Chest Size (in):  n/[name] IV Started by: Emmit Pomfret, RN  Height: 5\' 6"  (1.676 m)  Cup Size: B  BMI:  Body mass index is 22.77 kg/(m^2). Weight:  141 lb (63.957 kg)   Tech Comments:  n/[name]    Nuclear Med Study 1 or 2 day study: 1 day  Stress Test Type:  Lexiscan  Order Authorizing Provider:  Zoila Shutter, MD    Resting Radionuclide: Technetium 78m Sestamibi  Resting Radionuclide Dose: 10.5 mCi   Stress Radionuclide:  Technetium 79m Sestamibi  Stress Radionuclide Dose: 31.0 mCi           Stress Protocol Rest HR: 64 Stress HR: 110  Rest BP: 181/103 Stress BP: 171/107  Exercise Time (min): n/[name] METS: n/[name]   Predicted Max HR: 163 bpm % Max HR: 67.48 bpm Rate Pressure Product: 25366  Dose of Adenosine (mg):  n/[name] Dose of Lexiscan: 0.4 mg  Dose of Atropine (mg): n/[name] Dose of Dobutamine: n/[name] mcg/kg/min (at max HR)  Stress Test Technologist: Esperanza Sheets, CCT Nuclear Technologist: Gonzella Lex, CNMT   Rest Procedure:  Myocardial perfusion imaging was performed at rest 45 minutes following the intravenous administration of Technetium 42m Sestamibi. Stress Procedure:  The patient received IV Lexiscan 0.4 mg over 15-seconds.  Technetium 50m Sestamibi injected at 30-seconds.  The patient experienced SOB and Panic, 125 mg IV Aminophylline was given with resolution of symptoms.  There were no significant  changes with Lexiscan.  Quantitative spect images were obtained after [name] 45 minute delay.  Transient Ischemic Dilatation (Normal <1.22):  1.06 Lung/Heart Ratio (Normal <0.45):  0.23 QGS EDV:  78 ml QGS ESV:  27 ml LV Ejection Fraction: 65%  Signed by      Rest ECG: NSR - Normal EKG; small nondiagnostic Q waves  Stress ECG: No significant change from baseline ECG  QPS Raw Data Images:  Normal; no motion artifact; normal heart/lung ratio. Stress Images:  Normal homogeneous uptake in all areas of the myocardium. Rest Images:  Normal homogeneous uptake in all areas of the myocardium. Subtraction (SDS):  Normal  Impression Exercise Capacity:  Lexiscan with no exercise. BP Response:  Hypertensive at rest Clinical Symptoms:  Shortness of breath ECG Impression:  No significant ST segment change suggestive of ischemia. Comparison with Prior Nuclear Study: No images to compare  Overall Impression:  Normal stress nuclear study.  LV Wall Motion:  NL LV Function; NL Wall Motion   Judy Guadalajara A, MD  01/22/2013 1:47 PM

## 2013-01-22 NOTE — Telephone Encounter (Signed)
Rx was sent to pharmacy electronically. 

## 2013-01-31 ENCOUNTER — Ambulatory Visit (INDEPENDENT_AMBULATORY_CARE_PROVIDER_SITE_OTHER): Payer: BC Managed Care – PPO | Admitting: Internal Medicine

## 2013-01-31 ENCOUNTER — Encounter: Payer: Self-pay | Admitting: Internal Medicine

## 2013-01-31 VITALS — BP 170/92 | HR 76 | Ht 67.0 in | Wt 143.6 lb

## 2013-01-31 DIAGNOSIS — I10 Essential (primary) hypertension: Secondary | ICD-10-CM

## 2013-01-31 DIAGNOSIS — F17200 Nicotine dependence, unspecified, uncomplicated: Secondary | ICD-10-CM

## 2013-01-31 DIAGNOSIS — E7800 Pure hypercholesterolemia, unspecified: Secondary | ICD-10-CM

## 2013-01-31 DIAGNOSIS — R079 Chest pain, unspecified: Secondary | ICD-10-CM

## 2013-01-31 NOTE — Patient Instructions (Signed)
Keep track of your blood pressures at least twice daily. Return in 2-3 weeks for bp follow-up.

## 2013-01-31 NOTE — Progress Notes (Signed)
OFFICE NOTE  Chief Complaint:  Chest pain, DOE, HTN  Primary Care Physician: Juluis Mire, MD  HPI:  [name] is a pleasant 57 year old female with little past medical history other than hypertension. She was seen by Dr. wall in 2010 and placed on Norvasc. This is generally worked well with her blood pressure control recently her blood pressures were noted to be much higher in the 160s systolic. Over the past few weeks she's been under increasing amount of stress and has noted increased shortness of breath with exertion that is relieved at rest. This is associated with chest heaviness and prompted an emergency department visit. In the emergency department she ruled out for MI and was encouraged to followup with a cardiologist. Since Dr. wall is not seeing general cardiology patient's at this time, she presented to our practice. Of note, there is a strong family history of heart disease with her father who has heart disease, however the onset was in his 5s and he had bypass surgery 4 years ago. She also is a smoker but just a few cigarettes a day.  Her shortness of breath has become significant enough that it is difficult for her to walk several hundred feet without becoming short of breath.  Mrs. Dade underwent a nuclear stress test on 01/22/2013.  This was negative for ischemia and showed a preserved ejection fraction. At her last visit I started her on her irbesartan HCTZ. She took 3 doses of the medication and noted after each dose had extreme back pain across her back and both sides. She then discontinued the medication and the symptoms improved. She said she had a similar reaction with lisinopril HCTZ in the past. Not sure if this is due to the thiazide component or not.  I will list this is a medication intolerance.   PMHx:  Past Medical History  Diagnosis Date  . Hypertension     History reviewed. No pertinent past surgical history.  FAMHx:  Family History  Problem  Relation Age of Onset  . CAD Father     CABG  . Cancer Brother     lymphoma    SOCHx:   reports that she has been smoking.  She has never used smokeless tobacco. She reports that she does not drink alcohol or use illicit drugs.  ALLERGIES:  Allergies  Allergen Reactions  . Codeine     ROS: A comprehensive review of systems was negative except for: Respiratory: positive for dyspnea on exertion Cardiovascular: positive for chest pain  HOME MEDS: Current Outpatient Prescriptions  Medication Sig Dispense Refill  . amLODipine (NORVASC) 10 MG tablet TAKE 1 TABLET (10 MG TOTAL) BY MOUTH DAILY.  30 tablet  2  . aspirin 81 MG tablet Take 81 mg by mouth daily.       No current facility-administered medications for this visit.    LABS/IMAGING: No results found for this or any previous visit (from the past 48 hour(s)). No results found.  VITALS: BP 170/92  Pulse 76  Ht 5\' 7"  (1.702 m)  Wt 143 lb 9.6 oz (65.137 kg)  BMI 22.49 kg/m2  EXAM: General appearance: alert and no distress Neck: no adenopathy, no carotid bruit, no JVD, supple, symmetrical, trachea midline and thyroid not enlarged, symmetric, no tenderness/mass/nodules Lungs: clear to auscultation bilaterally Heart: regular rate and rhythm, S1, S2 normal, no murmur, click, rub or gallop Abdomen: soft, non-tender; bowel sounds normal; no masses,  no organomegaly Extremities: extremities normal, atraumatic, no cyanosis  or edema Pulses: 2+ and symmetric Skin: Skin color, texture, turgor normal. No rashes or lesions Neurologic: Grossly normal  EKG: Normal sinus rhythm at 69, no ischemic changes  ASSESSMENT: 1. Chest heaviness and dyspnea on exertion, concerning for unstable angina 2. Hypertension-uncontrolled 3. Family history of coronary disease 4. Unable to exercise on treadmill  PLAN: 1.   Mrs. Kuba had a negative nuclear stress test with no evidence for ischemia. This is reassuring, and her symptoms have  improved  Her blood pressure again today is markedly elevated at 170/92. I suspect that there may be some element of whitecoat hypertension as she reports her blood pressures are generally better control at home. Therefore recommended she take twice-daily blood pressures over the next couple of weeks and bring that record back to me. If her blood pressure continues to be elevated there may be a neuropathic component to her elevated blood pressure, and I would recommend starting a nonselective beta blocker in addition to amlodipine. I'll plan to see her back in a few weeks to review results of her blood pressure readings.  Chrystie Nose, MD, [facility] Attending Cardiologist [facility]  Reshaun Briseno C 01/31/2013, 6:13 PM

## 2013-02-21 ENCOUNTER — Ambulatory Visit (INDEPENDENT_AMBULATORY_CARE_PROVIDER_SITE_OTHER): Payer: BC Managed Care – PPO | Admitting: Internal Medicine

## 2013-02-21 ENCOUNTER — Encounter: Payer: Self-pay | Admitting: Internal Medicine

## 2013-02-21 VITALS — BP 150/100 | HR 76 | Ht 67.0 in | Wt 143.4 lb

## 2013-02-21 DIAGNOSIS — I10 Essential (primary) hypertension: Secondary | ICD-10-CM

## 2013-02-21 MED ORDER — NEBIVOLOL HCL 5 MG PO TABS: 5.0000 mg | ORAL_TABLET | Freq: Every day | ORAL | Status: DC

## 2013-02-21 NOTE — Patient Instructions (Signed)
Start Bystolic 5mg  daily.  Please schedule a blood pressure check with [name] in about 2-3 weeks.

## 2013-02-21 NOTE — Progress Notes (Signed)
OFFICE NOTE  Chief Complaint:  Chest pain, DOE, HTN  Primary Care Physician: Judy Mire, MD  HPI:  [name] is a pleasant 57 year old female with little past medical history other than hypertension. She was seen by [name] in 2010 and placed on Norvasc. This is generally worked well with her blood pressure control recently her blood pressures were noted to be much higher in the 160s systolic. Over the past few weeks she's been under increasing amount of stress and has noted increased shortness of breath with exertion that is relieved at rest. This is associated with chest heaviness and prompted an emergency department visit. In the emergency department she ruled out for MI and was encouraged to followup with a cardiologist. Since [name] is not seeing general cardiology patient's at this time, she presented to our practice. Of note, there is a strong family history of heart disease with her father who has heart disease, however the onset was in his 69s and he had bypass surgery 4 years ago. She also is a smoker but just a few cigarettes a day.  Her shortness of breath has become significant enough that it is difficult for her to walk several hundred feet without becoming short of breath.  [name] underwent a nuclear stress test on 01/22/2013.  This was negative for ischemia and showed a preserved ejection fraction. At her last visit I started her on her irbesartan HCTZ. She took 3 doses of the medication and noted after each dose had extreme back pain across her back and both sides. She then discontinued the medication and the symptoms improved. She said she had a similar reaction with lisinopril HCTZ in the past. Not sure if this is due to the thiazide component or not.  I will list this is a medication intolerance.   I had her keep track of her blood pressures at home and she noted that in the mornings typically after taking her amlodipine at 6 in the morning, her blood pressure  was running fairly well in the 130s over 80s. However she noted later in the afternoon the blood pressure started creeping up to the 150-160 systolic range. It seems like she may be having waning effect of the amlodipine. We did discuss an option or possibly a beta blocker at her last visit given the fact that she was intolerant to lisinopril HCTZ.    PMHx:  Past Medical History  Diagnosis Date  . Hypertension     History reviewed. No pertinent past surgical history.  FAMHx:  Family History  Problem Relation Age of Onset  . CAD Father     CABG  . Cancer Brother     lymphoma    SOCHx:   reports that she has been smoking.  She has never used smokeless tobacco. She reports that she does not drink alcohol or use illicit drugs.  ALLERGIES:  Allergies  Allergen Reactions  . Codeine     ROS: A comprehensive review of systems was negative except for: Respiratory: positive for dyspnea on exertion Cardiovascular: positive for chest pain  HOME MEDS: Current Outpatient Prescriptions  Medication Sig Dispense Refill  . amLODipine (NORVASC) 10 MG tablet TAKE 1 TABLET (10 MG TOTAL) BY MOUTH DAILY.  30 tablet  2  . aspirin 81 MG tablet Take 81 mg by mouth daily.      . nebivolol (BYSTOLIC) 5 MG tablet Take 1 tablet (5 mg total) by mouth daily.  30 tablet  6  No current facility-administered medications for this visit.    LABS/IMAGING: No results found for this or any previous visit (from the past 48 hour(s)). No results found.  VITALS: BP 150/100  Pulse 76  Ht 5\' 7"  (1.702 m)  Wt 143 lb 6.4 oz (65.046 kg)  BMI 22.45 kg/m2  EXAM: deferred  EKG: deferred  ASSESSMENT: 1. Chest heaviness and dyspnea on exertion - resolved. Low risk stress test. 2. Hypertension-uncontrolled 3. Family history of coronary disease 4. Unable to exercise on treadmill  PLAN: 1.   [name] continues to have uncontrolled hypertension, mostly later in the day. This may have to do with the  waning effect of the amlodipine. Unfortunately she was intolerant to lisinopril and HCTZ which causes dose-dependent back pain. I listened this is an intolerance and recommended trying bystolic, medication with low likelihood of side effects. She can take this in the afternoon or early evening to minimize any fatigue and maybe associated with it.  I will have her followup with [name] our pharmacists in 2-3 weeks with adjustments in her medications as necessary.  Judy Nose, MD, [facility] Attending Cardiologist [facility]  [name],[name] 02/21/2013, 3:17 PM

## 2013-03-25 ENCOUNTER — Ambulatory Visit (INDEPENDENT_AMBULATORY_CARE_PROVIDER_SITE_OTHER): Payer: BC Managed Care – PPO | Admitting: Pharmacist Clinician (PhC)/ Clinical Pharmacy Specialist

## 2013-03-25 ENCOUNTER — Encounter: Payer: Self-pay | Admitting: Pharmacist Clinician (PhC)/ Clinical Pharmacy Specialist

## 2013-03-25 VITALS — BP 156/88 | HR 56

## 2013-03-25 DIAGNOSIS — I10 Essential (primary) hypertension: Secondary | ICD-10-CM

## 2013-03-25 NOTE — Progress Notes (Signed)
     03/25/2013 [name] 1955/09/05 [id number]   HPI:  [name] is a 57 y.o. female patient of Dr [name], with a PMH below who presents today for a blood pressure check.  She has been on amlodipine for many years, and has tried both irbesartan hctz and lisinopril hctz, both caused her to have severe lower back pains.  It is not known which of the ingredients caused her the problem.  She saw Dr. Rennis [name] in October and was started on Bystolic 5mg  each evening in addition to her morning amlodipine.   She is a current smoker, aproximately 1 pack per week.  She states this is a huge improvement from her past, when she would smoke 1-2 ppd.  She does not drink alcohol.  She only consumes a small amount of caffeine, and does add salt to her food while cooking.  She states no problems with the addition of Bystolic.  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  She has checked her blood pressure at home a few times and states that it is always in the 110-120/70s    Current Outpatient Prescriptions  Medication Sig Dispense Refill  . amLODipine (NORVASC) 10 MG tablet TAKE 1 TABLET (10 MG TOTAL) BY MOUTH DAILY.  30 tablet  2  . aspirin 81 MG tablet Take 81 mg by mouth daily.      . nebivolol (BYSTOLIC) 5 MG tablet Take 1 tablet (5 mg total) by mouth daily.  28 tablet  0   No current facility-administered medications for this visit.    Allergies  Allergen Reactions  . Codeine   . Lisinopril-Hydrochlorothiazide Other (See Comments)    Back pain    Past Medical History  Diagnosis Date  . Hypertension     Blood pressure 156/88, pulse 56.   ASSESSMENT AND PLAN:  Although she believes her BP is good at home, it is still elevated in the office.  Her heartrate has decreased some with the addition of Bystolic 5mg , going from 70's to 56.  I have asked her to check her home BP several times per week and keep a log of the  readings.  She will need to bring the BP cuff with her to her next appointment as well as the readings.  I have asked, also, that she record the heartrates, so we can determine if today's heartrate is lower due to the addition of Bystolic or if it still runs in the 60-70 range.  I have asked her to return in another 6 weeks for follow up.  If at that time her pressures are still elevated, we might have to try the addition of an ARB without HCTZ or low dose spironolactone.  I'm not sure we would be able to increase the Bystolic, unless her home heart rates are higher.    [name] PharmD CPP [facility]

## 2013-03-25 NOTE — Patient Instructions (Signed)
Return for BP follow up in 6 weeks  Your blood pressure today is elevated at 156/88  Check your blood pressure at home every other day and keep record of the readings.  Take your BP meds as follows:  AM: amlodipine 10mg    PM: Bystolic 5mg   Bring all of your meds, your BP cuff and your record of home blood pressures to your next appointment.  Exercise as you're able, try to walk approximately 30 minutes per day.  Keep salt intake to a minimum, especially watch canned and prepared boxed foods.  Eat more fresh fruits and vegetables and fewer canned items.  Avoid eating in fast food restaurants.   HOW TO TAKE YOUR BLOOD PRESSURE:   Rest 5 minutes before taking your blood pressure.    Don't smoke or drink caffeinated beverages for at least 30 minutes before.   Take your blood pressure before (not after) you eat.   Sit comfortably with your back supported and both feet on the floor (don't cross your legs).   Elevate your arm to heart level on a table or a desk.   Use the proper sized cuff. It should fit smoothly and snugly around your bare upper arm. There should be enough room to slip a fingertip under the cuff. The bottom edge of the cuff should be 1 inch above the crease of the elbow.   Ideally, take 3 measurements at one sitting and record the average.

## 2013-04-09 ENCOUNTER — Encounter: Payer: Self-pay | Admitting: Pharmacist Clinician (PhC)/ Clinical Pharmacy Specialist

## 2013-04-19 ENCOUNTER — Other Ambulatory Visit: Payer: Self-pay | Admitting: Internal Medicine

## 2013-04-22 NOTE — Telephone Encounter (Signed)
Rx was sent to pharmacy electronically. 

## 2013-05-06 ENCOUNTER — Encounter: Payer: Self-pay | Admitting: Pharmacist Clinician (PhC)/ Clinical Pharmacy Specialist

## 2013-05-06 ENCOUNTER — Ambulatory Visit (INDEPENDENT_AMBULATORY_CARE_PROVIDER_SITE_OTHER): Payer: BC Managed Care – PPO | Admitting: Pharmacist Clinician (PhC)/ Clinical Pharmacy Specialist

## 2013-05-06 VITALS — BP 164/90 | HR 64 | Ht 67.0 in | Wt 144.2 lb

## 2013-05-06 DIAGNOSIS — I10 Essential (primary) hypertension: Secondary | ICD-10-CM

## 2013-05-06 MED ORDER — NEBIVOLOL HCL 10 MG PO TABS: 10.0000 mg | ORAL_TABLET | Freq: Every day | ORAL | Status: DC

## 2013-05-06 NOTE — Patient Instructions (Signed)
Come back in 1 month for a blood pressure check  Your blood pressure today is elevated at 164/90  Check your blood pressure at home as able and keep record of the readings.  Take your BP meds as follows:  AM: amlodipine 10mg    PM: Bystolic 10mg  (take 2 of the 5mg  tabs until gone)  Exercise as you're able, try to walk approximately 30 minutes per day.  Keep salt intake to a minimum, especially watch canned and prepared boxed foods.  Eat more fresh fruits and vegetables and fewer canned items.  Avoid eating in fast food restaurants.   HOW TO TAKE YOUR BLOOD PRESSURE:   Rest 5 minutes before taking your blood pressure.    Don't smoke or drink caffeinated beverages for at least 30 minutes before.   Take your blood pressure before (not after) you eat.   Sit comfortably with your back supported and both feet on the floor (don't cross your legs).   Elevate your arm to heart level on a table or a desk.   Use the proper sized cuff. It should fit smoothly and snugly around your bare upper arm. There should be enough room to slip a fingertip under the cuff. The bottom edge of the cuff should be 1 inch above the crease of the elbow.   Ideally, take 3 measurements at one sitting and record the average.

## 2013-05-06 NOTE — Progress Notes (Signed)
     05/06/2013 [name] [birth year] [id number]    HPI:  [name] is a 58 y.o. female patient of Dr Debara Pickett, with a PMH below who presents today for a blood pressure check.  I last saw her about 6 weeks ago and at that time her pressure was at 156/88.  At that time she reported that her home readings were mostly 903-833X systolic.  She currently takes amlodipine 10mg  and Bystolic 5mg .  She has an intolerance to lisinopril hct that caused her to have severe back pains, but as it was a combination med, I don't know which of the 2 products she actually reacted to.  She currently smokes 1 ppw which she states is an improvement from her past, when she would smoke 1-2 ppd. She does not drink alcohol. She only consumes a small amount of caffeine, and does add salt to her food while cooking.  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  Today she comes in with BP readings still in the 120s and low 832N [id number]/04-59X diastolic.  She came without her BP cuff for the appointment, but actually went home and came back later in the afternoon so we could confirm her BP cuff accuracy.     Current Outpatient Prescriptions  Medication Sig Dispense Refill  . amLODipine (NORVASC) 10 MG tablet TAKE 1 TABLET EVERY DAY  30 tablet  9  . aspirin 81 MG tablet Take 81 mg by mouth daily.      . nebivolol (BYSTOLIC) 5 MG tablet Take 1 tablet (5 mg total) by mouth daily.  28 tablet  0   No current facility-administered medications for this visit.    Allergies  Allergen Reactions  . Codeine   . Lisinopril-Hydrochlorothiazide Other (See Comments)    Back pain    Past Medical History  Diagnosis Date  . Hypertension     Blood pressure 164/90, pulse 64, height 5\' 7"  (1.702 m), weight 144 lb 3.2 oz (65.409 kg).   ASSESSMENT AND PLAN:  [name] returned this afternoon with her BP cuff so that we could verify its accuracy before  making any medication changes.  Her cuff is a sphygmomanometer, not electronic, and her husband does the checks for her.  Unfortunately he is much larger than she is and the cuff is too large for her arms.  This is why her home readings are looking so much better than office ones.  At this point we will increase her Bystolic to 10mg  daily and I have asked her to check her pressure at the pharmacy when she is able.  As she just filled the 5mg  prescription, I will have her double that until the tablets are gone, then start on the 10mg  tablets.  I will see her back in one month for a follow up.    Tommy Medal PharmD CPP [facility]

## 2013-06-06 ENCOUNTER — Encounter: Payer: BC Managed Care – PPO | Admitting: Pharmacist Clinician (PhC)/ Clinical Pharmacy Specialist

## 2013-06-10 ENCOUNTER — Encounter: Payer: Self-pay | Admitting: Pharmacist Clinician (PhC)/ Clinical Pharmacy Specialist

## 2013-06-10 ENCOUNTER — Ambulatory Visit (INDEPENDENT_AMBULATORY_CARE_PROVIDER_SITE_OTHER): Payer: BC Managed Care – PPO | Admitting: Pharmacist Clinician (PhC)/ Clinical Pharmacy Specialist

## 2013-06-10 ENCOUNTER — Encounter: Payer: BC Managed Care – PPO | Admitting: Pharmacist Clinician (PhC)/ Clinical Pharmacy Specialist

## 2013-06-10 VITALS — BP 156/84 | HR 64 | Wt 144.6 lb

## 2013-06-10 DIAGNOSIS — I10 Essential (primary) hypertension: Secondary | ICD-10-CM

## 2013-06-10 MED ORDER — NEBIVOLOL HCL 10 MG PO TABS: 10.0000 mg | ORAL_TABLET | Freq: Every day | ORAL | Status: DC

## 2013-06-10 NOTE — Progress Notes (Signed)
     06/10/2013 [name] 12-26-1955 [id number]   HPI:  [name] is a 58 y.o. female patient of Dr Debara Pickett, with a PMH below who presents today for a blood pressure check. I  last saw her about 6 weeks ago and at that time her pressure was still elevated at 164/90.  She currently takes amlodipine 10mg  and Bystolic 10mg . She has an intolerance to lisinopril hct that caused her to have severe back pains, but as it was a combination med, I don't know which of the 2 products she actually reacted to. She currently smokes 1 ppw which she states is an improvement from her past, when she would smoke 1-2 ppd. She does not drink alcohol. She only consumes a small amount of caffeine, and does add salt to her food while cooking. 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 Today she states her home readings are still running in the 125-130/74-82.  I asked if she had bought an appropriate sized cuff, but she is still using the extra large one that fits her husband.  So the fact that her home readings are good doesn't really mean anything to [place].      Current Outpatient Prescriptions  Medication Sig Dispense Refill  . amLODipine (NORVASC) 10 MG tablet TAKE 1 TABLET EVERY DAY  30 tablet  9  . aspirin 81 MG tablet Take 81 mg by mouth daily.      . nebivolol (BYSTOLIC) 10 MG tablet Take 1 tablet (10 mg total) by mouth daily.  30 tablet  6   No current facility-administered medications for this visit.    Allergies  Allergen Reactions  . Codeine   . Lisinopril-Hydrochlorothiazide Other (See Comments)    Back pain    Past Medical History  Diagnosis Date  . Hypertension     Blood pressure 156/84, pulse 64, weight 144 lb 9.6 oz (65.59 kg).   ASSESSMENT AND PLAN:  Today her blood pressure is essentially unchanged from the last visit.  I will increase her Bystolic to 20mg  daily and have given her some samples to help out.   I will contact the manufacturer for a copay assistance card to help further bring down her costs.  She has tried both ACE and ARB with HCTZ and had reactions, which leaves a lot of medications out.  If her pressure is still elevated at her next visit, I would probably add hydralazine.  I will see her again in another 6 weeks.Tommy Medal PharmD CPP [place]

## 2013-06-10 NOTE — Patient Instructions (Signed)
Your blood pressure today is high at 003/49  Increase Bystolic to 20mg  each night  Check your blood pressure at home daily (if able) and keep record of the readings.  Take your BP meds as follows:  AM: amlodipine 10mg    PM: Bystolic 20mg    Bring all of your meds, your BP cuff and your record of home blood pressures to your next appointment.  Exercise as you're able, try to walk approximately 30 minutes per day.  Keep salt intake to a minimum, especially watch canned and prepared boxed foods.  Eat more fresh fruits and vegetables and fewer canned items.  Avoid eating in fast food restaurants.     HOW TO TAKE YOUR BLOOD PRESSURE:   Rest 5 minutes before taking your blood pressure.    Don't smoke or drink caffeinated beverages for at least 30 minutes before.   Take your blood pressure before (not after) you eat.   Sit comfortably with your back supported and both feet on the floor (don't cross your legs).   Elevate your arm to heart level on a table or a desk.   Use the proper sized cuff. It should fit smoothly and snugly around your bare upper arm. There should be enough room to slip a fingertip under the cuff. The bottom edge of the cuff should be 1 inch above the crease of the elbow.   Ideally, take 3 measurements at one sitting and record the average.

## 2013-06-25 ENCOUNTER — Telehealth: Payer: Self-pay | Admitting: Pharmacist Clinician (PhC)/ Clinical Pharmacy Specialist

## 2013-06-25 NOTE — Telephone Encounter (Signed)
Pt LMOM to return call, has question about Bystolic

## 2013-07-03 NOTE — Telephone Encounter (Signed)
Pt quit bystolic last week after 10 pound weight gain.  Has been bruising on stomach with itching.  Bruising starting to resolve, itching gone, now back down 6 lbs.  Checked BP 2 days ago 120/70, HR 59.  Was checked at [facility], home cuff is too large for pt.  Also pt double amlodipine to 10mg  bid.  Advised her to drop amlodipine back to 10mg  qd.  Continue daily or every other day BP checks at [facility], keep appt on 3/30.  Pt voiced understanding

## 2013-07-22 ENCOUNTER — Ambulatory Visit (INDEPENDENT_AMBULATORY_CARE_PROVIDER_SITE_OTHER): Payer: BC Managed Care – PPO | Admitting: Pharmacist Clinician (PhC)/ Clinical Pharmacy Specialist

## 2013-07-22 VITALS — BP 168/92 | Ht 67.0 in | Wt 144.0 lb

## 2013-07-22 DIAGNOSIS — I10 Essential (primary) hypertension: Secondary | ICD-10-CM

## 2013-07-22 MED ORDER — HYDRALAZINE HCL 10 MG PO TABS: 10.0000 mg | ORAL_TABLET | Freq: Two times a day (BID) | ORAL | Status: DC

## 2013-07-22 NOTE — Patient Instructions (Signed)
Return for a a follow up appointment in 6 wks   Your blood pressure today is 168/92   Check your blood pressure at home daily (if able) and keep record of the readings.  Take your BP meds as follows - continue amlodipine 10mg  and start hydralazine 10mg  twice daily  Bring all of your meds, your BP cuff and your record of home blood pressures to your next appointment.  Exercise as you're able, try to walk approximately 30 minutes per day.  Keep salt intake to a minimum, especially watch canned and prepared boxed foods.  Eat more fresh fruits and vegetables and fewer canned items.  Avoid eating in fast food restaurants.    HOW TO TAKE YOUR BLOOD PRESSURE:   Rest 5 minutes before taking your blood pressure.    Don't smoke or drink caffeinated beverages for at least 30 minutes before.   Take your blood pressure before (not after) you eat.   Sit comfortably with your back supported and both feet on the floor (don't cross your legs).   Elevate your arm to heart level on a table or a desk.   Use the proper sized cuff. It should fit smoothly and snugly around your bare upper arm. There should be enough room to slip a fingertip under the cuff. The bottom edge of the cuff should be 1 inch above the crease of the elbow.   Ideally, take 3 measurements at one sitting and record the average.  BP cuff brands - Omron is best, but many store brands work well.

## 2013-07-23 ENCOUNTER — Encounter: Payer: Self-pay | Admitting: Pharmacist Clinician (PhC)/ Clinical Pharmacy Specialist

## 2013-07-23 NOTE — Progress Notes (Signed)
     07/23/2013 [name] 1956/02/13 [id number]   HPI:  [name] is a 58 y.o. female patient of Dr Debara Pickett, with a PMH below who presents today for a blood pressure check. I  last saw her about 6 weeks ago and at that time her pressure was still consistently elevated at 160/94  At that visit I increased her Bystolic from 10 to 20mg  daily.  After about 1 week, pt called reporting a 10 lb weight gain, itching and bruising.   The Bystolic was held for several days and her weight dropped by 6 pounds and the itching resolved.  At that time Bystolic was discontinued.  She continued with amlodipine 10mg .  She has an intolerance to lisinopril hct that caused her to have severe back pains, but as it was a combination med, I don't know which of the 2 products she actually reacted to. She currently smokes 1 ppw which she states is an improvement from her past, when she would smoke 1-2 ppd. She does not drink alcohol. She only consumes a small amount of caffeine, and does add salt to her food while cooking. 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 Today she states her home readings (taken at the nearby [facility]) are still running in the 120-130/70-90 range.  She had previously used a home cuff belonging to her husband, but as the cuff was too large for her arm the readings were all inaccurate.     Current Outpatient Prescriptions  Medication Sig Dispense Refill  . amLODipine (NORVASC) 10 MG tablet TAKE 1 TABLET EVERY DAY  30 tablet  9  . aspirin 81 MG tablet Take 81 mg by mouth daily.      . hydrALAZINE (APRESOLINE) 10 MG tablet Take 1 tablet (10 mg total) by mouth 2 (two) times daily.  60 tablet  3   No current facility-administered medications for this visit.    Allergies  Allergen Reactions  . Codeine   . Lisinopril-Hydrochlorothiazide Other (See Comments)    Back pain    Past Medical History  Diagnosis  Date  . Hypertension     Blood pressure 168/92, height 5\' 7"  (1.702 m), weight 144 lb (65.318 kg). Standing 160/94   ASSESSMENT AND PLAN:  Unfortunately she always seems to have good numbers at home that we cannot duplicate or verify.  Today I suggested that we needed to either put her on a 24 hour monitor or have her buy a home meter of the correct size for herself.  After discussing her options, she has decided to purchase a home meter.  I am also going to start her on hydralazine 10mg  twice daily.  I have asked that she check her BP daily and keep record of the readings.  I will see her back in 1 month, with her cuff.  Hopefully she will be able to tolerate the hydralazine and we can increase the dose without problem   Tommy Medal PharmD CPP [place]

## 2013-09-02 ENCOUNTER — Encounter: Payer: Self-pay | Admitting: Pharmacist Clinician (PhC)/ Clinical Pharmacy Specialist

## 2013-09-02 ENCOUNTER — Ambulatory Visit (INDEPENDENT_AMBULATORY_CARE_PROVIDER_SITE_OTHER): Payer: BC Managed Care – PPO | Admitting: Pharmacist Clinician (PhC)/ Clinical Pharmacy Specialist

## 2013-09-02 VITALS — BP 140/80 | HR 64 | Ht 67.0 in | Wt 144.0 lb

## 2013-09-02 DIAGNOSIS — I10 Essential (primary) hypertension: Secondary | ICD-10-CM

## 2013-09-02 MED ORDER — HYDRALAZINE HCL 25 MG PO TABS: 25.0000 mg | ORAL_TABLET | Freq: Two times a day (BID) | ORAL | Status: DC

## 2013-09-02 NOTE — Progress Notes (Signed)
        09/02/2013 [name] 05-Sep-1955 [id number]   HPI:  [name] is a 58 y.o. female patient of Dr Debara Pickett, with a PMH below who presents today for a blood pressure check. I  last saw her about 6 weeks ago.  Her BP has been very slow to respond, but for the past 6 weeks has been mostly in the 150s/90s, with only one reading >409 systolic.  Her diastolic however, still runs from 80-110.  At her last visit I added hydralazine 10mg , a low dose, but I was concerned about her apparent sensitivities to other BP meds.   Yesterday, in frustration at her readings, she took a 10mg  bystolic tablet that she found in her medicine cabinet.  Today her BP was 1286/83 on her home machine before coming into the office.  She had previously stopped the Bystolic due to weight gain and itching. She has also previously had problems with  Lisinopril hct that caused her to have back pains, but as it was a combination med, I don't know which of the 2 products she actually reacted to. She currently smokes 1 ppw which she states is an improvement from her past, when she would smoke 1-2 ppd. She does not drink alcohol. She only consumes a small amount of caffeine, and does add salt to her food while cooking. 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 She bought a new BP cuff about 1 month ago, this with a cuff that is the proper size for her arm.  She brings it in with her today for comparison.     Current Outpatient Prescriptions  Medication Sig Dispense Refill  . amLODipine (NORVASC) 10 MG tablet TAKE 1 TABLET EVERY DAY  30 tablet  9  . aspirin 81 MG tablet Take 81 mg by mouth daily.      . hydrALAZINE (APRESOLINE) 10 MG tablet Take 1 tablet (10 mg total) by mouth 2 (two) times daily.  60 tablet  3   No current facility-administered medications for this visit.    Allergies  Allergen Reactions  . Codeine   .  Lisinopril-Hydrochlorothiazide Other (See Comments)    Back pain    Past Medical History  Diagnosis Date  . Hypertension     Blood pressure 140/80, pulse 64, height 5\' 7"  (1.702 m), weight 144 lb (65.318 kg). Standing 160/94   ASSESSMENT AND PLAN: Her BP is much better controlled today, although I don't know how much of that is the Bystolic dose from last night.  Overall her home readings are improving, but still not quite to goal.  I have asked that she increase the hydralazine to 25mg  twice daily and call me in about 2 weeks with her readings.  At that time, if she is still hypertensive, I will consider adding the Bystolic back in, as she has tablets at home and is willing to re-try the medication.   Her home BP cuff, a new Omron, read to within 10 points.    Tommy Medal PharmD CPP [facility]

## 2013-09-02 NOTE — Patient Instructions (Signed)
Your blood pressure today is better at 140/80  Check your blood pressure at home daily (if able) and keep record of the readings.  Take your BP meds as follows - continue with amlodipine 10mg  each morning, increase hydralazine to 2 tablets (20mg ) twice daily until bottle empty, then get new prescription for 25mg  tablets  Call in about 2 weeks to let me know how your BP is doing.  Bring all of your meds, your BP cuff and your record of home blood pressures to your next appointment.  Exercise as you're able, try to walk approximately 30 minutes per day.  Keep salt intake to a minimum, especially watch canned and prepared boxed foods.  Eat more fresh fruits and vegetables and fewer canned items.  Avoid eating in fast food restaurants.    HOW TO TAKE YOUR BLOOD PRESSURE:   Rest 5 minutes before taking your blood pressure.    Don't smoke or drink caffeinated beverages for at least 30 minutes before.   Take your blood pressure before (not after) you eat.   Sit comfortably with your back supported and both feet on the floor (don't cross your legs).   Elevate your arm to heart level on a table or a desk.   Use the proper sized cuff. It should fit smoothly and snugly around your bare upper arm. There should be enough room to slip a fingertip under the cuff. The bottom edge of the cuff should be 1 inch above the crease of the elbow.   Ideally, take 3 measurements at one sitting and record the average.

## 2013-10-10 DIAGNOSIS — M79669 Pain in unspecified lower leg: Secondary | ICD-10-CM | POA: Insufficient documentation

## 2013-10-10 DIAGNOSIS — M79673 Pain in unspecified foot: Secondary | ICD-10-CM | POA: Insufficient documentation

## 2014-02-04 ENCOUNTER — Other Ambulatory Visit: Payer: Self-pay | Admitting: Internal Medicine

## 2014-02-04 NOTE — Telephone Encounter (Signed)
Rx was sent to pharmacy electronically. 

## 2014-03-07 ENCOUNTER — Other Ambulatory Visit: Payer: Self-pay | Admitting: Internal Medicine

## 2014-03-07 NOTE — Telephone Encounter (Signed)
Rx refill sent to patient pharmacy   

## 2014-04-03 ENCOUNTER — Other Ambulatory Visit: Payer: Self-pay | Admitting: Internal Medicine

## 2014-04-03 NOTE — Telephone Encounter (Signed)
Rx was sent to pharmacy electronically. 

## 2014-04-15 ENCOUNTER — Other Ambulatory Visit: Payer: Self-pay | Admitting: Internal Medicine

## 2014-04-15 NOTE — Telephone Encounter (Signed)
Rx(s) sent to pharmacy electronically. OV 05/20/2014

## 2014-05-18 ENCOUNTER — Other Ambulatory Visit: Payer: Self-pay | Admitting: Internal Medicine

## 2014-05-20 ENCOUNTER — Ambulatory Visit: Payer: BC Managed Care – PPO | Admitting: Internal Medicine

## 2014-05-20 NOTE — Telephone Encounter (Signed)
Rx(s) sent to pharmacy electronically. OV 06/05/14

## 2014-06-05 ENCOUNTER — Encounter: Payer: Self-pay | Admitting: Internal Medicine

## 2014-06-05 ENCOUNTER — Ambulatory Visit (INDEPENDENT_AMBULATORY_CARE_PROVIDER_SITE_OTHER): Payer: BLUE CROSS/BLUE SHIELD | Admitting: Internal Medicine

## 2014-06-05 VITALS — BP 138/80 | HR 71 | Ht 66.0 in | Wt 145.1 lb

## 2014-06-05 DIAGNOSIS — R0789 Other chest pain: Secondary | ICD-10-CM

## 2014-06-05 DIAGNOSIS — E7800 Pure hypercholesterolemia, unspecified: Secondary | ICD-10-CM

## 2014-06-05 DIAGNOSIS — I10 Essential (primary) hypertension: Secondary | ICD-10-CM

## 2014-06-05 MED ORDER — HYDRALAZINE HCL 25 MG PO TABS: 25.0000 mg | ORAL_TABLET | Freq: Two times a day (BID) | ORAL | Status: DC

## 2014-06-05 MED ORDER — AMLODIPINE BESYLATE 10 MG PO TABS: 10.0000 mg | ORAL_TABLET | Freq: Every day | ORAL | Status: DC

## 2014-06-05 NOTE — Patient Instructions (Signed)
Your physician wants you to follow-up in: 1 Year. You will receive a reminder letter in the mail two months in advance. If you don't receive a letter, please call our office to schedule the follow-up appointment.  

## 2014-06-05 NOTE — Progress Notes (Signed)
OFFICE NOTE  Chief Complaint:  No complaints  Primary Care Physician: Darlyn Chamber, MD  HPI:  [name] is a pleasant 59 year old female with little past medical history other than hypertension. She was seen by Dr. wall in 2010 and placed on Norvasc. This is generally worked well with her blood pressure control recently her blood pressures were noted to be much higher in the 034V systolic. Over the past few weeks she's been under increasing amount of stress and has noted increased shortness of breath with exertion that is relieved at rest. This is associated with chest heaviness and prompted an emergency department visit. In the emergency department she ruled out for MI and was encouraged to followup with a cardiologist. Since Dr. wall is not seeing general cardiology patient's at this time, she presented to our practice. Of note, there is a strong family history of heart disease with her father who has heart disease, however the onset was in his 41s and he had bypass surgery 4 years ago. She also is a smoker but just a few cigarettes a day.  Her shortness of breath has become significant enough that it is difficult for her to walk several hundred feet without becoming short of breath.  [name] underwent a nuclear stress test on 01/22/2013.  This was negative for ischemia and showed a preserved ejection fraction. At her last visit I started her on her irbesartan HCTZ. She took 3 doses of the medication and noted after each dose had extreme back pain across her back and both sides. She then discontinued the medication and the symptoms improved. She said she had a similar reaction with lisinopril HCTZ in the past. Not sure if this is due to the thiazide component or not.  I will list this is a medication intolerance.   I had her keep track of her blood pressures at home and she noted that in the mornings typically after taking her amlodipine at 6 in the morning, her blood pressure was  running fairly well in the 130s over 80s. However she noted later in the afternoon the blood pressure started creeping up to the 425-956 systolic range. It seems like she may be having waning effect of the amlodipine. We did discuss an option or possibly a beta blocker at her last visit given the fact that she was intolerant to lisinopril HCTZ.  I saw [name] back in the office today. It seems that we've finally been able to get her blood pressures under control. She's currently on amlodipine 10 mg daily and hydralazine 25 mg twice daily. She had significant intolerance to lisinopril and those listed is an intolerance. She denies any chest pain or worsening shortness of breath.  PMHx:  Past Medical History  Diagnosis Date  . Hypertension     No past surgical history on file.  FAMHx:  Family History  Problem Relation Age of Onset  . CAD Father     CABG  . Cancer Brother     lymphoma    SOCHx:   reports that she has been smoking Cigarettes.  She has a 12.5 pack-year smoking history. She has never used smokeless tobacco. She reports that she does not drink alcohol or use illicit drugs.  ALLERGIES:  Allergies  Allergen Reactions  . Codeine   . Lisinopril-Hydrochlorothiazide Other (See Comments)    Back pain    ROS: A comprehensive review of systems was negative.  HOME MEDS: Current Outpatient Prescriptions  Medication Sig  Dispense Refill  . amLODipine (NORVASC) 10 MG tablet Take 1 tablet (10 mg total) by mouth daily. 90 tablet 3  . aspirin 81 MG tablet Take 81 mg by mouth daily.    . hydrALAZINE (APRESOLINE) 25 MG tablet Take 1 tablet (25 mg total) by mouth 2 (two) times daily. 180 tablet 3   No current facility-administered medications for this visit.    LABS/IMAGING: No results found for this or any previous visit (from the past 48 hour(s)). No results found.  VITALS: BP 138/80 mmHg  Pulse 71  Ht 5\' 6"  (1.676 m)  Wt 145 lb 1.6 oz (65.817 kg)  BMI 23.43  kg/m2  EXAM: deferred  EKG: deferred  ASSESSMENT: 1. Hypertension-at goal 2. Family history of coronary disease 3. Negative nuclear stress test 12/2012  PLAN: 1.   [name] is doing really well. Her blood pressures finally controlled and she feels well. She has no side effects from the amlodipine. She needs to continue work on exercise and physical activity. Overall her stress test last year was negative. We'll continue risk factor modification. Plan to see her back annually or sooner as necessary.  Pixie Casino, MD, [facility] Attending Cardiologist [facility] [place] [facility] 06/05/2014, 4:35 PM

## 2015-05-26 DIAGNOSIS — M1812 Unilateral primary osteoarthritis of first carpometacarpal joint, left hand: Secondary | ICD-10-CM | POA: Insufficient documentation

## 2015-06-05 ENCOUNTER — Ambulatory Visit (INDEPENDENT_AMBULATORY_CARE_PROVIDER_SITE_OTHER): Payer: BLUE CROSS/BLUE SHIELD | Admitting: Internal Medicine

## 2015-06-05 ENCOUNTER — Encounter: Payer: Self-pay | Admitting: Internal Medicine

## 2015-06-05 VITALS — BP 136/84 | HR 67 | Ht 65.0 in | Wt 142.6 lb

## 2015-06-05 DIAGNOSIS — F17200 Nicotine dependence, unspecified, uncomplicated: Secondary | ICD-10-CM

## 2015-06-05 DIAGNOSIS — I10 Essential (primary) hypertension: Secondary | ICD-10-CM | POA: Diagnosis not present

## 2015-06-05 DIAGNOSIS — E7800 Pure hypercholesterolemia, unspecified: Secondary | ICD-10-CM

## 2015-06-05 NOTE — Progress Notes (Signed)
OFFICE NOTE  Chief Complaint:  No complaints  Primary Care Physician: Darlyn Chamber, MD  HPI:  [name] is a pleasant 60 year old female with little past medical history other than hypertension. She was seen by Dr. wall in 2010 and placed on Norvasc. This is generally worked well with her blood pressure control recently her blood pressures were noted to be much higher in the 960A systolic. Over the past few weeks she's been under increasing amount of stress and has noted increased shortness of breath with exertion that is relieved at rest. This is associated with chest heaviness and prompted an emergency department visit. In the emergency department she ruled out for MI and was encouraged to followup with a cardiologist. Since Dr. wall is not seeing general cardiology patient's at this time, she presented to our practice. Of note, there is a strong family history of heart disease with her father who has heart disease, however the onset was in his 38s and he had bypass surgery 4 years ago. She also is a smoker but just a few cigarettes a day.  Her shortness of breath has become significant enough that it is difficult for her to walk several hundred feet without becoming short of breath.  [name] underwent a nuclear stress test on 01/22/2013.  This was negative for ischemia and showed a preserved ejection fraction. At her last visit I started her on her irbesartan HCTZ. She took 3 doses of the medication and noted after each dose had extreme back pain across her back and both sides. She then discontinued the medication and the symptoms improved. She said she had a similar reaction with lisinopril HCTZ in the past. Not sure if this is due to the thiazide component or not.  I will list this is a medication intolerance.   I had her keep track of her blood pressures at home and she noted that in the mornings typically after taking her amlodipine at 6 in the morning, her blood pressure was  running fairly well in the 130s over 80s. However she noted later in the afternoon the blood pressure started creeping up to the 540-981 systolic range. It seems like she may be having waning effect of the amlodipine. We did discuss an option or possibly a beta blocker at her last visit given the fact that she was intolerant to lisinopril HCTZ.  I saw [name] back in the office today. It seems that we've finally been able to get her blood pressures under control. She's currently on amlodipine 10 mg daily and hydralazine 25 mg twice daily. She had significant intolerance to lisinopril and those listed is an intolerance. She denies any chest pain or worsening shortness of breath.   [name] returns today for follow-up. She denies any worsening shortness of breath or chest pain.  Blood pressure is well-controlled on her current regimen. She is on daily aspirin. As mentioned she had a stress test in 2014 which was negative for ischemia. A comprehensive review systems was negative.  PMHx:  Past Medical History  Diagnosis Date  . Hypertension     No past surgical history on file.  FAMHx:  Family History  Problem Relation Age of Onset  . CAD Father     CABG  . Cancer Brother     lymphoma    SOCHx:   reports that she has been smoking Cigarettes.  She has a 12.5 pack-year smoking history. She has never used smokeless tobacco. She reports  that she does not drink alcohol or use illicit drugs.  ALLERGIES:  Allergies  Allergen Reactions  . Codeine   . Lisinopril-Hydrochlorothiazide Other (See Comments)    Back pain    ROS: A comprehensive review of systems was negative.  HOME MEDS: Current Outpatient Prescriptions  Medication Sig Dispense Refill  . amLODipine (NORVASC) 10 MG tablet Take 1 tablet (10 mg total) by mouth daily. 90 tablet 3  . aspirin 81 MG tablet Take 81 mg by mouth daily.    . hydrALAZINE (APRESOLINE) 25 MG tablet Take 1 tablet (25 mg total) by mouth 2 (two) times  daily. 180 tablet 3   No current facility-administered medications for this visit.    LABS/IMAGING: No results found for this or any previous visit (from the past 48 hour(s)). No results found.  VITALS: BP 136/84 mmHg  Pulse 67  Ht '5\' 5"'$  (1.651 m)  Wt 142 lb 9.6 oz (64.683 kg)  BMI 23.73 kg/m2  EXAM:  GEN: Awake, NAD HEENT : PERRLA, EOMI Cardio vascular:  Regular rate and rhythm, no murmurs Abdomen: soft, nontender  extremities: No edema  neurologic: grossly nonfocal  psych: Pleasant  EKG: deferred  ASSESSMENT: 1. Hypertension-at goal 2. Family history of coronary disease 3. Negative nuclear stress test 12/2012  PLAN: 1.   [name] is doing really well. Her blood pressures finally controlled and she feels well. She has no side effects from the amlodipine. She needs to continue work on exercise and physical activity. Overall her stress test in 2014 was negative. We'll continue risk factor modification. Plan to see her back annually or sooner as necessary.  Pixie Casino, MD, [facility] Attending Cardiologist [place] C Alishah Schulte 06/05/2015, 9:53 AM

## 2015-06-05 NOTE — Patient Instructions (Signed)
Your physician wants you to follow-up in:  1 year with Dr. Debara Pickett.  You will receive a reminder letter in the mail two months in advance. If you don't receive a letter, please call our office to schedule the follow-up appointment.  Your physician recommends that you continue on your current medications as directed. Please refer to the Current Medication list given to you today.

## 2015-06-25 ENCOUNTER — Other Ambulatory Visit: Payer: Self-pay | Admitting: Internal Medicine

## 2015-06-25 NOTE — Telephone Encounter (Signed)
Rx request sent to pharmacy.  

## 2016-05-24 ENCOUNTER — Other Ambulatory Visit: Payer: Self-pay | Admitting: *Deleted

## 2016-05-24 MED ORDER — HYDRALAZINE HCL 25 MG PO TABS: 25.0000 mg | ORAL_TABLET | Freq: Two times a day (BID) | ORAL | 0 refills | Status: DC

## 2016-06-13 ENCOUNTER — Encounter: Payer: Self-pay | Admitting: Internal Medicine

## 2016-06-13 ENCOUNTER — Ambulatory Visit (INDEPENDENT_AMBULATORY_CARE_PROVIDER_SITE_OTHER): Payer: Self-pay | Admitting: Internal Medicine

## 2016-06-13 VITALS — BP 146/80 | HR 80 | Ht 65.0 in | Wt 137.0 lb

## 2016-06-13 DIAGNOSIS — F6381 Intermittent explosive disorder: Secondary | ICD-10-CM | POA: Insufficient documentation

## 2016-06-13 DIAGNOSIS — I10 Essential (primary) hypertension: Secondary | ICD-10-CM

## 2016-06-13 MED ORDER — FLUOXETINE HCL 20 MG PO TABS: 20.0000 mg | ORAL_TABLET | Freq: Every day | ORAL | 3 refills | Status: DC

## 2016-06-13 NOTE — Progress Notes (Signed)
OFFICE NOTE  Chief Complaint:  "Moody, frequent outbursts"  Primary Care Physician: Darlyn Chamber, MD  HPI:  [name] is a pleasant 61 year old female with little past medical history other than hypertension. She was seen by Dr. wall in 2010 and placed on Norvasc. This is generally worked well with her blood pressure control recently her blood pressures were noted to be much higher in the 253G systolic. Over the past few weeks she's been under increasing amount of stress and has noted increased shortness of breath with exertion that is relieved at rest. This is associated with chest heaviness and prompted an emergency department visit. In the emergency department she ruled out for MI and was encouraged to followup with a cardiologist. Since Dr. wall is not seeing general cardiology patient's at this time, she presented to our practice. Of note, there is a strong family history of heart disease with her father who has heart disease, however the onset was in his 66s and he had bypass surgery 4 years ago. She also is a smoker but just a few cigarettes a day.  Her shortness of breath has become significant enough that it is difficult for her to walk several hundred feet without becoming short of breath.  [name] underwent a nuclear stress test on 01/22/2013.  This was negative for ischemia and showed a preserved ejection fraction. At her last visit I started her on her irbesartan HCTZ. She took 3 doses of the medication and noted after each dose had extreme back pain across her back and both sides. She then discontinued the medication and the symptoms improved. She said she had a similar reaction with lisinopril HCTZ in the past. Not sure if this is due to the thiazide component or not.  I will list this is a medication intolerance.   I had her keep track of her blood pressures at home and she noted that in the mornings typically after taking her amlodipine at 6 in the morning, her blood  pressure was running fairly well in the 130s over 80s. However she noted later in the afternoon the blood pressure started creeping up to the 644-034 systolic range. It seems like she may be having waning effect of the amlodipine. We did discuss an option or possibly a beta blocker at her last visit given the fact that she was intolerant to lisinopril HCTZ.  I saw [name] back in the office today. It seems that we've finally been able to get her blood pressures under control. She's currently on amlodipine 10 mg daily and hydralazine 25 mg twice daily. She had significant intolerance to lisinopril and those listed is an intolerance. She denies any chest pain or worsening shortness of breath.   [name] returns today for follow-up. She denies any worsening shortness of breath or chest pain.  Blood pressure is well-controlled on her current regimen. She is on daily aspirin. As mentioned she had a stress test in 2014 which was negative for ischemia. A comprehensive review systems was negative.  05/26/2016  [name] was seen today in follow-up. Blood pressure appears to be well-controlled. She is on amlodipine and hydralazine 25 twice a day. She denies any chest pain or worsening shortness of breath. EKG shows normal sinus rhythm at 80. She did mention today that she was concerned about recent moodiness. She reported her husband noted that she often is very edgy and can outbursts without any good reason. She is also very irritable to her  colleagues, which she considers his friends and her coworkers for which she is very "short" with. She recognizes this is abnormal but recently has been told by other family members including her husband that this is becoming somewhat of a problem. She had an issue with this in the past and was treated with Xanax and ultimately it resolved. She denies any depressive symptoms or worsening anxiety.  PMHx:  Past Medical History:  Diagnosis Date  . Hypertension      History reviewed. No pertinent surgical history.  FAMHx:  Family History  Problem Relation Age of Onset  . CAD Father     CABG  . Cancer Brother     lymphoma    SOCHx:   reports that she has been smoking Cigarettes.  She has a 12.50 pack-year smoking history. She has never used smokeless tobacco. She reports that she does not drink alcohol or use drugs.  ALLERGIES:  Allergies  Allergen Reactions  . Codeine   . Lisinopril-Hydrochlorothiazide Other (See Comments)    Back pain    ROS: Pertinent items noted in HPI and remainder of comprehensive ROS otherwise negative.  HOME MEDS: Current Outpatient Prescriptions  Medication Sig Dispense Refill  . amLODipine (NORVASC) 10 MG tablet TAKE 1 TABLET BY MOUTH EVERY DAY 90 tablet 3  . aspirin 81 MG tablet Take 81 mg by mouth daily.    . hydrALAZINE (APRESOLINE) 25 MG tablet Take 1 tablet (25 mg total) by mouth 2 (two) times daily. 60 tablet 0  . FLUoxetine (PROZAC) 20 MG tablet Take 1 tablet (20 mg total) by mouth daily. 30 tablet 3   No current facility-administered medications for this visit.     LABS/IMAGING: No results found for this or any previous visit (from the past 48 hour(s)). No results found.  VITALS: BP (!) 146/80   Pulse 80   Ht '5\' 5"'$  (1.651 m)   Wt 137 lb (62.1 kg)   BMI 22.80 kg/m   EXAM:  GEN: Awake, NAD HEENT : PERRLA, EOMI Cardio vascular:  Regular rate and rhythm, no murmurs Abdomen: soft, nontender  extremities: No edema  neurologic: grossly nonfocal  psych: Pleasant  EKG: Sinus rhythm at 80.  ASSESSMENT: 1. Intermittent explosive disorder 2. Hypertension-at goal 3. Family history of coronary disease 4. Negative nuclear stress test 12/2012  PLAN: 1.   [name] is good control of her blood pressure and no cardiac findings. Unfortunately recently she's had some self-described irritability and intermittent symptoms concerning for intermittent explosive disorder. She's had some similar  symptoms in the past however it seemed to resolved with treatment on benzodiazepines. More appropriate treatment would be using Prozac. There is data on low-dose 20 mg treatment in this population. I would start with that and see her back in 3-4 weeks to see if we need to further titrate the dose based on response.  Pixie Casino, MD, [facility] Attending Cardiologist [place] 06/13/2016, 7:12 PM

## 2016-06-13 NOTE — Patient Instructions (Addendum)
Your physician recommends that you schedule a follow-up appointment in: [place] with Dr. Debara Pickett  Your physician has recommended you make the following change in your medication: START fluoxetine (Prozac) 20 mg daily  [place]: [facility]: [phone]

## 2016-06-22 ENCOUNTER — Other Ambulatory Visit: Payer: Self-pay | Admitting: Internal Medicine

## 2016-07-13 ENCOUNTER — Other Ambulatory Visit: Payer: Self-pay | Admitting: Internal Medicine

## 2016-09-23 DIAGNOSIS — S32810A Multiple fractures of pelvis with stable disruption of pelvic ring, initial encounter for closed fracture: Secondary | ICD-10-CM

## 2016-09-23 HISTORY — DX: Multiple fractures of pelvis with stable disruption of pelvic ring, initial encounter for closed fracture: S32.810A

## 2016-10-17 DIAGNOSIS — S32810A Multiple fractures of pelvis with stable disruption of pelvic ring, initial encounter for closed fracture: Secondary | ICD-10-CM | POA: Insufficient documentation

## 2017-08-11 ENCOUNTER — Other Ambulatory Visit: Payer: Self-pay | Admitting: Internal Medicine

## 2017-09-03 ENCOUNTER — Other Ambulatory Visit: Payer: Self-pay | Admitting: Internal Medicine

## 2017-09-04 NOTE — Telephone Encounter (Signed)
REFILL 

## 2017-09-10 ENCOUNTER — Other Ambulatory Visit: Payer: Self-pay | Admitting: Internal Medicine

## 2017-09-11 NOTE — Telephone Encounter (Signed)
Rx has been sent to the pharmacy electronically. ° °

## 2017-12-12 ENCOUNTER — Other Ambulatory Visit: Payer: Self-pay | Admitting: Internal Medicine

## 2018-03-09 ENCOUNTER — Other Ambulatory Visit: Payer: Self-pay | Admitting: Internal Medicine

## 2018-04-24 DIAGNOSIS — L728 Other follicular cysts of the skin and subcutaneous tissue: Secondary | ICD-10-CM | POA: Diagnosis not present

## 2018-05-20 ENCOUNTER — Other Ambulatory Visit: Payer: Self-pay | Admitting: Internal Medicine

## 2018-06-08 ENCOUNTER — Other Ambulatory Visit: Payer: Self-pay | Admitting: Internal Medicine

## 2018-06-13 ENCOUNTER — Other Ambulatory Visit: Payer: Self-pay | Admitting: Internal Medicine

## 2018-07-07 ENCOUNTER — Other Ambulatory Visit: Payer: Self-pay | Admitting: Internal Medicine

## 2018-07-13 ENCOUNTER — Other Ambulatory Visit: Payer: Self-pay | Admitting: Internal Medicine

## 2018-07-28 ENCOUNTER — Other Ambulatory Visit: Payer: Self-pay | Admitting: Internal Medicine

## 2018-07-30 NOTE — Telephone Encounter (Signed)
Amlodipine refilled.

## 2018-08-06 ENCOUNTER — Other Ambulatory Visit: Payer: Self-pay | Admitting: Internal Medicine

## 2018-08-17 ENCOUNTER — Other Ambulatory Visit: Payer: Self-pay | Admitting: Internal Medicine

## 2018-08-30 ENCOUNTER — Other Ambulatory Visit: Payer: Self-pay | Admitting: Internal Medicine

## 2018-08-31 MED ORDER — AMLODIPINE BESYLATE 10 MG PO TABS: ORAL_TABLET | ORAL | 0 refills | Status: DC

## 2018-08-31 NOTE — Addendum Note (Signed)
Addended byBarry Brunner on: 08/31/2018 10:40 AM   Modules accepted: Orders

## 2018-09-01 ENCOUNTER — Other Ambulatory Visit: Payer: Self-pay | Admitting: Internal Medicine

## 2018-09-02 NOTE — Telephone Encounter (Signed)
Hydralazine refilled.

## 2018-09-04 ENCOUNTER — Telehealth: Payer: Self-pay | Admitting: Internal Medicine

## 2018-09-04 NOTE — Telephone Encounter (Signed)
Due to the recent COVID-19 pandemic, we are transitioning in-person office visits to tele-medicine visits in an effort to decrease unnecessary exposure to our patients, their families, and staff. These visits are billed to your insurance just like a normal visit is. We also encourage you to sign up for MyChart if you have not already done so. You will need a smartphone if possible. For patients that do not have this, we can still complete the visit using a regular telephone but do prefer a smartphone to enable video when possible. You may have a family member that lives with you that can help. If possible, we also ask that you have a blood pressure cuff and scale at home to measure your blood pressure, heart rate and weight prior to your scheduled appointment. Patients with clinical needs that need an in-person evaluation and testing will still be able to come to the office if absolutely necessary. If you have any questions, feel free to call our office.    YOUR PROVIDER WILL BE USING DOXIMITY or [email or web address] - The staff will give you instructions on receiving your link to join the meeting the day of your visit.    THE DAY OF YOUR APPOINTMENT  Approximately 15 minutes prior to your scheduled appointment, you will receive a telephone call from one of [facility] team - your caller ID may say "Unknown caller."  Our staff will confirm medications, vital signs for the day and any symptoms you may be experiencing. Please have this information available prior to the time of visit start. It may also be helpful for you to have a pad of paper and pen handy for any instructions given during your visit. They will also walk you through joining the smartphone meeting if this is a video visit.    CONSENT FOR TELE-HEALTH VISIT - PLEASE REVIEW  I hereby voluntarily request, consent and authorize [facility] and its employed or contracted physicians, physician assistants, nurse practitioners or other licensed health care  professionals (the Practitioner), to provide me with telemedicine health care services (the "Services") as deemed necessary by the treating Practitioner. I acknowledge and consent to receive the Services by the Practitioner via telemedicine. I understand that the telemedicine visit will involve communicating with the Practitioner through live audiovisual communication technology and the disclosure of certain medical information by electronic transmission. I acknowledge that I have been given the opportunity to request an in-person assessment or other available alternative prior to the telemedicine visit and am voluntarily participating in the telemedicine visit.   I understand that I have the right to withhold or withdraw my consent to the use of telemedicine in the course of my care at any time, without affecting my right to future care or treatment, and that the Practitioner or I may terminate the telemedicine visit at any time. I understand that I have the right to inspect all information obtained and/or recorded in the course of the telemedicine visit and may receive copies of available information for a reasonable fee.  I understand that some of the potential risks of receiving the Services via telemedicine include:   Delay or interruption in medical evaluation due to technological equipment failure or disruption;  Information transmitted may not be sufficient (e.g. poor resolution of images) to allow for appropriate medical decision making by the Practitioner; and/or  In rare instances, security protocols could fail, causing a breach of personal health information.   Furthermore, I acknowledge that it is my responsibility to provide information about my  medical history, conditions and care that is complete and accurate to the best of my ability. I acknowledge that Practitioner's advice, recommendations, and/or decision may be based on factors not within their control, such as incomplete or  inaccurate data provided by me or distortions of diagnostic images or specimens that may result from electronic transmissions. I understand that the practice of medicine is not an exact science and that Practitioner makes no warranties or guarantees regarding treatment outcomes. I acknowledge that I will receive a copy of this consent concurrently upon execution via email to the email address I last provided but may also request a printed copy by calling the office of [place].     I understand that my insurance will be billed for this visit.   I have read or had this consent read to me.  I understand the contents of this consent, which adequately explains the benefits and risks of the Services being provided via telemedicine.  I have been provided ample opportunity to ask questions regarding this consent and the Services and have had my questions answered to my satisfaction.  I give my informed consent for the services to be provided through the use of telemedicine in my medical care   By participating in this telemedicine visit I agree to the above.

## 2018-09-04 NOTE — Telephone Encounter (Signed)
Mychart, smartphone, consent, pre reg complete 09/04/18 AF

## 2018-09-05 ENCOUNTER — Telehealth (INDEPENDENT_AMBULATORY_CARE_PROVIDER_SITE_OTHER): Payer: BLUE CROSS/BLUE SHIELD | Admitting: Internal Medicine

## 2018-09-05 ENCOUNTER — Encounter: Payer: Self-pay | Admitting: Internal Medicine

## 2018-09-05 VITALS — BP 150/84 | Ht 66.0 in | Wt 140.0 lb

## 2018-09-05 DIAGNOSIS — I10 Essential (primary) hypertension: Secondary | ICD-10-CM

## 2018-09-05 DIAGNOSIS — E7800 Pure hypercholesterolemia, unspecified: Secondary | ICD-10-CM

## 2018-09-05 DIAGNOSIS — E785 Hyperlipidemia, unspecified: Secondary | ICD-10-CM | POA: Diagnosis not present

## 2018-09-05 MED ORDER — HYDRALAZINE HCL 25 MG PO TABS: 25.0000 mg | ORAL_TABLET | Freq: Three times a day (TID) | ORAL | 1 refills | Status: DC

## 2018-09-05 NOTE — Progress Notes (Signed)
Virtual Visit via Video Note   This visit type was conducted due to national recommendations for restrictions regarding the COVID-19 Pandemic (e.g. social distancing) in an effort to limit this patient's exposure and mitigate transmission in our community.  Due to her co-morbid illnesses, this patient is at least at moderate risk for complications without adequate follow up.  This format is felt to be most appropriate for this patient at this time.  All issues noted in this document were discussed and addressed.  A limited physical exam was performed with this format.  Please refer to the patient's chart for her consent to telehealth for [facility].   Evaluation Performed: Doximity video visit  Date:  09/05/2018   ID:  [name], DOB 06-21-1955, MRN [id number]  Patient Location:  [place] [address]  Provider location:   [address] [place], [place] [address]  PCP:  [name], MD  Cardiologist:  No primary care provider on file. Electrophysiologist:  None   Chief Complaint: Stress  History of Present Illness:    [name] is a 63 y.o. female who presents via audio/video conferencing for a telehealth visit today.  [name] was seen today in video visit follow-up.  She reports being under stress but otherwise denies any chest pain or worsening shortness of breath.  She was concerned about intermittent explosive episodes and outbursts of her personality a year and a half ago and I had recommended adding low-dose Prozac.  She said it did not seem to help after taking it for about a month and then discontinued it.  She is still on amlodipine and hydralazine 25 mg twice daily for hypertension.  Her blood pressure generally runs between 829 and 562 systolic which it was today.  She has not had any recent lipid testing.  There is a strong family history of heart disease.  The patient does not have symptoms concerning for COVID-19 infection (fever, chills,  cough, or new SHORTNESS OF BREATH).    Prior CV studies:   The following studies were reviewed today:  Chart review Lab work  PMHx:  Past Medical History:  Diagnosis Date  . Hypertension     No past surgical history on file.  FAMHx:  Family History  Problem Relation Age of Onset  . CAD Father        CABG  . Cancer Brother        lymphoma    SOCHx:   reports that she has been smoking cigarettes. She has a 12.50 pack-year smoking history. She has never used smokeless tobacco. She reports that she does not drink alcohol or use drugs.  ALLERGIES:  Allergies  Allergen Reactions  . Codeine   . Lisinopril-Hydrochlorothiazide Other (See Comments)    Back pain    MEDS:  Current Meds  Medication Sig  . amLODipine (NORVASC) 10 MG tablet TAKE 1 TABLET (10 MG TOTAL) BY MOUTH DAILY. NEED OFFICE VISIT  . aspirin 81 MG tablet Take 81 mg by mouth daily.  . hydrALAZINE (APRESOLINE) 25 MG tablet TAKE 1 TABLET BY MOUTH TWICE A DAY     ROS: Pertinent items noted in HPI and remainder of comprehensive ROS otherwise negative.  Labs/Other Tests and Data Reviewed:    Recent Labs: No results found for requested labs within last 8760 hours.   Recent Lipid Panel Lab Results  Component Value Date/Time   CHOL 266 (HH) 05/29/2008 11:15 AM   TRIG 70 05/29/2008 11:15 AM  HDL 120.2 05/29/2008 11:15 AM   CHOLHDL 2.2 CALC 05/29/2008 11:15 AM   LDLDIRECT 130.1 05/29/2008 11:15 AM    Wt Readings from Last 3 Encounters:  09/05/18 140 lb (63.5 kg)  06/13/16 137 lb (62.1 kg)  06/05/15 142 lb 9.6 oz (64.7 kg)     Exam:    Vital Signs:  BP (!) 150/84   Ht 5\' 6"  (1.676 m)   Wt 140 lb (63.5 kg)   BMI 22.60 kg/m    General appearance: alert and no distress Lungs: clear to auscultation bilaterally Abdomen: Scaphoid Extremities: extremities normal, atraumatic, no cyanosis or edema Skin: Skin color, texture, turgor normal. No rashes or lesions Neurologic: Mental status: Alert,  oriented, thought content appropriate Pleasant  ASSESSMENT & PLAN:    1. Hypertension 2. Family history of coronary disease 3. Negative nuclear stress test 2014  [name] has suboptimal control of her blood pressure.  She notes some midday elevations in blood pressure which could be due to waning of her hydralazine.  I recommended we increase it to 25 mg 3 times daily.  She will remain on amlodipine 10 mg daily and low-dose aspirin.  We will also need to repeat lab work including metabolic profile and lipid profile which is overdue.  I recommend treatment to at least an LDL less than 100.  COVID-19 Education: The signs and symptoms of COVID-19 were discussed with the patient and how to seek care for testing (follow up with PCP or arrange E-visit).  The importance of social distancing was discussed today.  Patient Risk:   After full review of this patients clinical status, I feel that they are at least moderate risk at this time.  Time:   Today, I have spent 25 minutes with the patient with telehealth technology discussing blood pressure, mood, family history.     Medication Adjustments/Labs and Tests Ordered: Current medicines are reviewed at length with the patient today.  Concerns regarding medicines are outlined above.   Tests Ordered: No orders of the defined types were placed in this encounter.   Medication Changes: No orders of the defined types were placed in this encounter.   Disposition:  in 1 year(s)  Pixie Casino, MD, [facility], [place] Director of the [facility] Lipid [facility] &  Cardiovascular [facility] Diplomate of the American Board of Clinical Lipidology Attending Cardiologist  Direct Dial: [phone]  Fax: [phone]  Website:  [email or web address]  Pixie Casino, MD  09/05/2018 1:50 PM

## 2018-09-05 NOTE — Patient Instructions (Addendum)
Medication Instructions:   INCREASE hydralazine to 25 mg 3 times a day   If you need a refill on your cardiac medications before your next appointment, please call your pharmacy.   Lab work:  You will need to come into the office this week for labs (blood work) to be drawn: CMET Fasting Lipid  If you have labs (blood work) drawn today and your tests are completely normal, you will receive your results only by: Marland Kitchen MyChart Message (if you have MyChart) OR . A paper copy in the mail If you have any lab test that is abnormal or we need to change your treatment, we will call you to review the results.  Testing/Procedures:  NONE ordered at this time of appointment  Follow-Up: At [facility], you and your health needs are our priority.  As part of our continuing mission to provide you with exceptional heart care, we have created designated Provider Care Teams.  These Care Teams include your primary Cardiologist (physician) and Advanced Practice Providers (APPs -  Physician Assistants and Nurse Practitioners) who all work together to provide you with the care you need, when you need it. You will need a follow up appointment in 12 months.  Please call our office 2 months in advance to schedule this appointment.  You may see Pixie Casino, MD or one of the following Advanced Practice Providers on your designated Care Team: [place], [place] . Fabian Sharp, PA-C  Any Other Special Instructions Will Be Listed Below (If Applicable).

## 2018-12-11 DIAGNOSIS — N644 Mastodynia: Secondary | ICD-10-CM | POA: Diagnosis not present

## 2018-12-12 ENCOUNTER — Other Ambulatory Visit: Payer: Self-pay | Admitting: Obstetrics and Gynecology

## 2018-12-12 DIAGNOSIS — N644 Mastodynia: Secondary | ICD-10-CM

## 2018-12-18 ENCOUNTER — Ambulatory Visit
Admission: RE | Admit: 2018-12-18 | Discharge: 2018-12-18 | Disposition: A | Discharge status: Home / Self Care | Payer: BLUE CROSS/BLUE SHIELD | Source: Ambulatory Visit | Attending: Obstetrics and Gynecology | Admitting: Obstetrics and Gynecology

## 2018-12-18 ENCOUNTER — Other Ambulatory Visit: Payer: Self-pay

## 2018-12-18 DIAGNOSIS — N644 Mastodynia: Secondary | ICD-10-CM | POA: Diagnosis not present

## 2018-12-18 DIAGNOSIS — R928 Other abnormal and inconclusive findings on diagnostic imaging of breast: Secondary | ICD-10-CM | POA: Diagnosis not present

## 2019-01-14 DIAGNOSIS — M25551 Pain in right hip: Secondary | ICD-10-CM | POA: Diagnosis not present

## 2019-01-24 DIAGNOSIS — M25551 Pain in right hip: Secondary | ICD-10-CM | POA: Diagnosis not present

## 2019-01-28 DIAGNOSIS — M25551 Pain in right hip: Secondary | ICD-10-CM | POA: Diagnosis not present

## 2019-02-06 ENCOUNTER — Other Ambulatory Visit: Payer: Self-pay | Admitting: Internal Medicine

## 2019-02-18 DIAGNOSIS — M25551 Pain in right hip: Secondary | ICD-10-CM | POA: Diagnosis not present

## 2019-02-28 ENCOUNTER — Other Ambulatory Visit: Payer: Self-pay | Admitting: Internal Medicine

## 2019-03-12 DIAGNOSIS — M546 Pain in thoracic spine: Secondary | ICD-10-CM | POA: Diagnosis not present

## 2019-03-12 DIAGNOSIS — M549 Dorsalgia, unspecified: Secondary | ICD-10-CM | POA: Diagnosis not present

## 2019-03-12 DIAGNOSIS — M791 Myalgia, unspecified site: Secondary | ICD-10-CM | POA: Diagnosis not present

## 2019-03-18 DIAGNOSIS — M25512 Pain in left shoulder: Secondary | ICD-10-CM | POA: Diagnosis not present

## 2019-03-19 DIAGNOSIS — M5414 Radiculopathy, thoracic region: Secondary | ICD-10-CM | POA: Diagnosis not present

## 2019-03-19 DIAGNOSIS — M545 Low back pain: Secondary | ICD-10-CM | POA: Diagnosis not present

## 2019-03-19 DIAGNOSIS — M47894 Other spondylosis, thoracic region: Secondary | ICD-10-CM | POA: Diagnosis not present

## 2019-04-01 ENCOUNTER — Encounter (HOSPITAL_BASED_OUTPATIENT_CLINIC_OR_DEPARTMENT_OTHER): Payer: Self-pay

## 2019-04-01 ENCOUNTER — Other Ambulatory Visit: Payer: Self-pay

## 2019-04-01 ENCOUNTER — Emergency Department (HOSPITAL_BASED_OUTPATIENT_CLINIC_OR_DEPARTMENT_OTHER): Payer: BC Managed Care – PPO

## 2019-04-01 ENCOUNTER — Telehealth: Payer: Self-pay

## 2019-04-01 ENCOUNTER — Inpatient Hospital Stay (HOSPITAL_BASED_OUTPATIENT_CLINIC_OR_DEPARTMENT_OTHER)
Admission: EM | Admit: 2019-04-01 | Discharge: 2019-04-13 | DRG: 025 | Disposition: A | Discharge status: Rehabilitation Facility | Payer: BC Managed Care – PPO | Attending: Neurosurgery | Admitting: Neurosurgery

## 2019-04-01 DIAGNOSIS — I10 Essential (primary) hypertension: Secondary | ICD-10-CM | POA: Diagnosis present

## 2019-04-01 DIAGNOSIS — Z7901 Long term (current) use of anticoagulants: Secondary | ICD-10-CM | POA: Diagnosis not present

## 2019-04-01 DIAGNOSIS — Z7189 Other specified counseling: Secondary | ICD-10-CM | POA: Diagnosis not present

## 2019-04-01 DIAGNOSIS — C7931 Secondary malignant neoplasm of brain: Principal | ICD-10-CM | POA: Diagnosis present

## 2019-04-01 DIAGNOSIS — G8191 Hemiplegia, unspecified affecting right dominant side: Secondary | ICD-10-CM | POA: Diagnosis present

## 2019-04-01 DIAGNOSIS — C7951 Secondary malignant neoplasm of bone: Secondary | ICD-10-CM | POA: Diagnosis present

## 2019-04-01 DIAGNOSIS — R269 Unspecified abnormalities of gait and mobility: Secondary | ICD-10-CM | POA: Diagnosis not present

## 2019-04-01 DIAGNOSIS — Z86718 Personal history of other venous thrombosis and embolism: Secondary | ICD-10-CM | POA: Diagnosis not present

## 2019-04-01 DIAGNOSIS — F17210 Nicotine dependence, cigarettes, uncomplicated: Secondary | ICD-10-CM | POA: Diagnosis not present

## 2019-04-01 DIAGNOSIS — E7800 Pure hypercholesterolemia, unspecified: Secondary | ICD-10-CM | POA: Diagnosis not present

## 2019-04-01 DIAGNOSIS — G9389 Other specified disorders of brain: Secondary | ICD-10-CM | POA: Diagnosis not present

## 2019-04-01 DIAGNOSIS — R252 Cramp and spasm: Secondary | ICD-10-CM

## 2019-04-01 DIAGNOSIS — Z8249 Family history of ischemic heart disease and other diseases of the circulatory system: Secondary | ICD-10-CM | POA: Diagnosis not present

## 2019-04-01 DIAGNOSIS — G939 Disorder of brain, unspecified: Secondary | ICD-10-CM | POA: Diagnosis not present

## 2019-04-01 DIAGNOSIS — I82401 Acute embolism and thrombosis of unspecified deep veins of right lower extremity: Secondary | ICD-10-CM | POA: Diagnosis not present

## 2019-04-01 DIAGNOSIS — I82551 Chronic embolism and thrombosis of right peroneal vein: Secondary | ICD-10-CM

## 2019-04-01 DIAGNOSIS — Z7982 Long term (current) use of aspirin: Secondary | ICD-10-CM | POA: Diagnosis not present

## 2019-04-01 DIAGNOSIS — G8918 Other acute postprocedural pain: Secondary | ICD-10-CM | POA: Diagnosis not present

## 2019-04-01 DIAGNOSIS — Z9889 Other specified postprocedural states: Secondary | ICD-10-CM | POA: Diagnosis not present

## 2019-04-01 DIAGNOSIS — I82461 Acute embolism and thrombosis of right calf muscular vein: Secondary | ICD-10-CM | POA: Diagnosis not present

## 2019-04-01 DIAGNOSIS — M7989 Other specified soft tissue disorders: Secondary | ICD-10-CM | POA: Diagnosis not present

## 2019-04-01 DIAGNOSIS — Z888 Allergy status to other drugs, medicaments and biological substances status: Secondary | ICD-10-CM

## 2019-04-01 DIAGNOSIS — Z515 Encounter for palliative care: Secondary | ICD-10-CM | POA: Diagnosis not present

## 2019-04-01 DIAGNOSIS — K551 Chronic vascular disorders of intestine: Secondary | ICD-10-CM | POA: Diagnosis present

## 2019-04-01 DIAGNOSIS — D72823 Leukemoid reaction: Secondary | ICD-10-CM | POA: Diagnosis not present

## 2019-04-01 DIAGNOSIS — R609 Edema, unspecified: Secondary | ICD-10-CM | POA: Diagnosis not present

## 2019-04-01 DIAGNOSIS — C719 Malignant neoplasm of brain, unspecified: Secondary | ICD-10-CM | POA: Diagnosis not present

## 2019-04-01 DIAGNOSIS — I82411 Acute embolism and thrombosis of right femoral vein: Secondary | ICD-10-CM | POA: Diagnosis not present

## 2019-04-01 DIAGNOSIS — R29898 Other symptoms and signs involving the musculoskeletal system: Secondary | ICD-10-CM | POA: Diagnosis not present

## 2019-04-01 DIAGNOSIS — Z85118 Personal history of other malignant neoplasm of bronchus and lung: Secondary | ICD-10-CM | POA: Diagnosis not present

## 2019-04-01 DIAGNOSIS — G893 Neoplasm related pain (acute) (chronic): Secondary | ICD-10-CM | POA: Diagnosis not present

## 2019-04-01 DIAGNOSIS — R531 Weakness: Secondary | ICD-10-CM | POA: Diagnosis not present

## 2019-04-01 DIAGNOSIS — M21371 Foot drop, right foot: Secondary | ICD-10-CM | POA: Diagnosis not present

## 2019-04-01 DIAGNOSIS — E46 Unspecified protein-calorie malnutrition: Secondary | ICD-10-CM | POA: Diagnosis not present

## 2019-04-01 DIAGNOSIS — Q043 Other reduction deformities of brain: Secondary | ICD-10-CM | POA: Diagnosis not present

## 2019-04-01 DIAGNOSIS — D72828 Other elevated white blood cell count: Secondary | ICD-10-CM | POA: Diagnosis not present

## 2019-04-01 DIAGNOSIS — C801 Malignant (primary) neoplasm, unspecified: Secondary | ICD-10-CM | POA: Diagnosis not present

## 2019-04-01 DIAGNOSIS — R2689 Other abnormalities of gait and mobility: Secondary | ICD-10-CM | POA: Diagnosis not present

## 2019-04-01 DIAGNOSIS — C778 Secondary and unspecified malignant neoplasm of lymph nodes of multiple regions: Secondary | ICD-10-CM | POA: Diagnosis not present

## 2019-04-01 DIAGNOSIS — C3411 Malignant neoplasm of upper lobe, right bronchus or lung: Secondary | ICD-10-CM | POA: Diagnosis not present

## 2019-04-01 DIAGNOSIS — M419 Scoliosis, unspecified: Secondary | ICD-10-CM | POA: Diagnosis not present

## 2019-04-01 DIAGNOSIS — C3490 Malignant neoplasm of unspecified part of unspecified bronchus or lung: Secondary | ICD-10-CM | POA: Diagnosis not present

## 2019-04-01 DIAGNOSIS — G936 Cerebral edema: Secondary | ICD-10-CM | POA: Diagnosis present

## 2019-04-01 DIAGNOSIS — R52 Pain, unspecified: Secondary | ICD-10-CM | POA: Diagnosis not present

## 2019-04-01 DIAGNOSIS — Z20828 Contact with and (suspected) exposure to other viral communicable diseases: Secondary | ICD-10-CM | POA: Diagnosis not present

## 2019-04-01 DIAGNOSIS — C7802 Secondary malignant neoplasm of left lung: Secondary | ICD-10-CM | POA: Diagnosis not present

## 2019-04-01 DIAGNOSIS — Z885 Allergy status to narcotic agent status: Secondary | ICD-10-CM

## 2019-04-01 DIAGNOSIS — K5900 Constipation, unspecified: Secondary | ICD-10-CM | POA: Diagnosis not present

## 2019-04-01 DIAGNOSIS — D496 Neoplasm of unspecified behavior of brain: Secondary | ICD-10-CM | POA: Diagnosis not present

## 2019-04-01 DIAGNOSIS — R4182 Altered mental status, unspecified: Secondary | ICD-10-CM | POA: Diagnosis not present

## 2019-04-01 DIAGNOSIS — Z807 Family history of other malignant neoplasms of lymphoid, hematopoietic and related tissues: Secondary | ICD-10-CM | POA: Diagnosis not present

## 2019-04-01 DIAGNOSIS — M6281 Muscle weakness (generalized): Secondary | ICD-10-CM | POA: Diagnosis not present

## 2019-04-01 DIAGNOSIS — M25512 Pain in left shoulder: Secondary | ICD-10-CM | POA: Diagnosis not present

## 2019-04-01 DIAGNOSIS — J9811 Atelectasis: Secondary | ICD-10-CM | POA: Diagnosis not present

## 2019-04-01 DIAGNOSIS — C7801 Secondary malignant neoplasm of right lung: Secondary | ICD-10-CM | POA: Diagnosis not present

## 2019-04-01 DIAGNOSIS — E8809 Other disorders of plasma-protein metabolism, not elsewhere classified: Secondary | ICD-10-CM | POA: Diagnosis not present

## 2019-04-01 DIAGNOSIS — Z79899 Other long term (current) drug therapy: Secondary | ICD-10-CM | POA: Diagnosis not present

## 2019-04-01 DIAGNOSIS — K7689 Other specified diseases of liver: Secondary | ICD-10-CM | POA: Diagnosis not present

## 2019-04-01 HISTORY — DX: Bronchitis, not specified as acute or chronic: J40

## 2019-04-01 HISTORY — DX: Primary osteoarthritis, left hand: M19.042

## 2019-04-01 LAB — DIFFERENTIAL
Abs Immature Granulocytes: 0.04 10*3/uL (ref 0.00–0.07)
Basophils Absolute: 0 10*3/uL (ref 0.0–0.1)
Basophils Relative: 0 %
Eosinophils Absolute: 0.1 10*3/uL (ref 0.0–0.5)
Eosinophils Relative: 1 %
Immature Granulocytes: 0 %
Lymphocytes Relative: 20 %
Lymphs Abs: 1.8 10*3/uL (ref 0.7–4.0)
Monocytes Absolute: 0.5 10*3/uL (ref 0.1–1.0)
Monocytes Relative: 6 %
Neutro Abs: 6.8 10*3/uL (ref 1.7–7.7)
Neutrophils Relative %: 73 %

## 2019-04-01 LAB — RAPID URINE DRUG SCREEN, HOSP PERFORMED
Amphetamines: NOT DETECTED
Barbiturates: NOT DETECTED
Benzodiazepines: NOT DETECTED
Cocaine: NOT DETECTED
Opiates: NOT DETECTED
Tetrahydrocannabinol: NOT DETECTED

## 2019-04-01 LAB — URINALYSIS, ROUTINE W REFLEX MICROSCOPIC
Bilirubin Urine: NEGATIVE
Glucose, UA: NEGATIVE mg/dL
Hgb urine dipstick: NEGATIVE
Ketones, ur: NEGATIVE mg/dL
Nitrite: NEGATIVE
Protein, ur: NEGATIVE mg/dL
Specific Gravity, Urine: <1.005 — ABNORMAL LOW (ref 1.005–1.030)
pH: 6 (ref 5.0–8.0)

## 2019-04-01 LAB — CBC
HCT: 41 % (ref 36.0–46.0)
Hemoglobin: 13.6 g/dL (ref 12.0–15.0)
MCH: 30.8 pg (ref 26.0–34.0)
MCHC: 33.2 g/dL (ref 30.0–36.0)
MCV: 92.8 fL (ref 80.0–100.0)
Platelets: 407 10*3/uL — ABNORMAL HIGH (ref 150–400)
RBC: 4.42 MIL/uL (ref 3.87–5.11)
RDW: 12.1 % (ref 11.5–15.5)
WBC: 9.3 10*3/uL (ref 4.0–10.5)
nRBC: 0 % (ref 0.0–0.2)

## 2019-04-01 LAB — COMPREHENSIVE METABOLIC PANEL
ALT: 12 U/L (ref 0–44)
AST: 15 U/L (ref 15–41)
Albumin: 3.4 g/dL — ABNORMAL LOW (ref 3.5–5.0)
Alkaline Phosphatase: 108 U/L (ref 38–126)
Anion gap: 11 (ref 5–15)
BUN: 16 mg/dL (ref 8–23)
CO2: 23 mmol/L (ref 22–32)
Calcium: 9.3 mg/dL (ref 8.9–10.3)
Chloride: 107 mmol/L (ref 98–111)
Creatinine, Ser: 0.76 mg/dL (ref 0.44–1.00)
GFR calc Af Amer: >60 mL/min (ref >60)
GFR calc non Af Amer: >60 mL/min (ref >60)
Glucose, Bld: 118 mg/dL — ABNORMAL HIGH (ref 70–99)
Potassium: 3.8 mmol/L (ref 3.5–5.1)
Sodium: 141 mmol/L (ref 135–145)
Total Bilirubin: 0.3 mg/dL (ref 0.3–1.2)
Total Protein: 7 g/dL (ref 6.5–8.1)

## 2019-04-01 LAB — PROTIME-INR
INR: 1 (ref 0.8–1.2)
Prothrombin Time: 12.9 seconds (ref 11.4–15.2)

## 2019-04-01 LAB — URINALYSIS, MICROSCOPIC (REFLEX): RBC / HPF: NONE SEEN RBC/hpf (ref 0–5)

## 2019-04-01 LAB — ETHANOL: Alcohol, Ethyl (B): <10 mg/dL (ref <10)

## 2019-04-01 LAB — APTT: aPTT: 27 seconds (ref 24–36)

## 2019-04-01 LAB — SARS CORONAVIRUS 2 (TAT 6-24 HRS): SARS Coronavirus 2: NEGATIVE

## 2019-04-01 MED ORDER — ASPIRIN EC 81 MG PO TBEC
81.0000 mg | DELAYED_RELEASE_TABLET | Freq: Every day | ORAL | Status: DC
  Administered 2019-04-02 – 2019-04-04 (×3): 81 mg via ORAL
  Filled 2019-04-01 (×3): qty 1

## 2019-04-01 MED ORDER — GABAPENTIN 300 MG PO CAPS
300.0000 mg | ORAL_CAPSULE | Freq: Three times a day (TID) | ORAL | Status: DC | PRN
  Administered 2019-04-02 – 2019-04-11 (×5): 300 mg via ORAL
  Filled 2019-04-01 (×6): qty 1

## 2019-04-01 MED ORDER — AMLODIPINE BESYLATE 10 MG PO TABS
10.0000 mg | ORAL_TABLET | Freq: Every day | ORAL | Status: DC
  Administered 2019-04-02 – 2019-04-04 (×3): 10 mg via ORAL
  Filled 2019-04-01 (×3): qty 1

## 2019-04-01 MED ORDER — LEVETIRACETAM 500 MG PO TABS
500.0000 mg | ORAL_TABLET | Freq: Two times a day (BID) | ORAL | Status: DC
  Administered 2019-04-01 – 2019-04-04 (×8): 500 mg via ORAL
  Filled 2019-04-01 (×8): qty 1

## 2019-04-01 MED ORDER — HYDRALAZINE HCL 25 MG PO TABS
25.0000 mg | ORAL_TABLET | Freq: Two times a day (BID) | ORAL | Status: DC
  Administered 2019-04-01 – 2019-04-04 (×7): 25 mg via ORAL
  Filled 2019-04-01 (×7): qty 1

## 2019-04-01 NOTE — ED Notes (Signed)
[facility] notified (Tara) - patient ready for transport 

## 2019-04-01 NOTE — ED Triage Notes (Addendum)
Pt c/o decreased movement to right UE and right LE x 1 week-denies injury-states today she was prompted to come to ED because she wrote a check that looked like a child's hand writing-pt NAD-steady gait

## 2019-04-01 NOTE — ED Provider Notes (Signed)
[facility] EMERGENCY DEPARTMENT Provider Note   CSN: [id number] Arrival date & time: 04/01/19  1331     History   Chief Complaint Chief Complaint  Patient presents with  . Leg Problem  . Arm Problem    HPI [name] is a 63 y.o. female.  She has no significant past medical history.  She is complaining of weakness in her right arm and her right leg that began about a week ago although worsened today.  She is noticed that she is dragging her right foot when she is walking and her writing looks like a child her handwriting.  No visual symptoms no facial droop no trouble swallowing or speaking.  No recent illness symptoms.  No headache or chest pain.     The history is provided by the patient.  Cerebrovascular Accident This is a new problem. The current episode started more than 2 days ago. The problem occurs constantly. The problem has been gradually worsening. Pertinent negatives include no chest pain, no abdominal pain, no headaches and no shortness of breath. Nothing aggravates the symptoms. Nothing relieves the symptoms. She has tried nothing for the symptoms. The treatment provided no relief.    Past Medical History:  Diagnosis Date  . Hypertension     Patient Active Problem List   Diagnosis Date Noted  . Intermittent explosive disorder 06/13/2016  . Chest pain 01/11/2013  . HYPERCHOLESTEROLEMIA WITH HIGH HDL 04/08/2009  . Essential hypertension 04/08/2009  . TOBACCO ABUSE 11/20/2008    History reviewed. No pertinent surgical history.   OB History   No obstetric history on file.      Home Medications    Prior to Admission medications   Medication Sig Start Date End Date Taking? Authorizing Provider  amLODipine (NORVASC) 10 MG tablet TAKE 1 TABLET (10 MG TOTAL) BY MOUTH DAILY. NEED OFFICE VISIT 02/06/19   Pixie Casino, MD  aspirin 81 MG tablet Take 81 mg by mouth daily.    [provider]  hydrALAZINE (APRESOLINE) 25 MG tablet TAKE 1  TABLET BY MOUTH TWICE A DAY 02/28/19   Hilty, Nadean Corwin, MD    Family History Family History  Problem Relation Age of Onset  . CAD Father        CABG  . Cancer Brother        lymphoma    Social History Social History   Tobacco Use  . Smoking status: Current Every Day Smoker    Packs/day: 0.50    Years: 25.00    Pack years: 12.50    Types: Cigarettes  . Smokeless tobacco: Never Used  Substance Use Topics  . Alcohol use: No    Alcohol/week: 0.0 standard drinks  . Drug use: No     Allergies   Codeine and Lisinopril-hydrochlorothiazide   Review of Systems Review of Systems  Constitutional: Negative for fever.  HENT: Negative for sore throat.   Eyes: Negative for visual disturbance.  Respiratory: Negative for shortness of breath.   Cardiovascular: Negative for chest pain.  Gastrointestinal: Negative for abdominal pain.  Genitourinary: Negative for dysuria.  Musculoskeletal: Negative for neck pain.  Skin: Negative for rash.  Neurological: Positive for weakness. Negative for syncope, speech difficulty, numbness and headaches.     Physical Exam Updated Vital Signs BP 127/83 (BP Location: Left Arm)   Pulse 86   Temp 98.6 F (37 C) (Oral)   Resp 18   Ht 5' 7" (1.702 m)   Wt 60.8 kg  SpO2 98%   BMI 20.99 kg/m   Physical Exam Vitals signs and nursing note reviewed.  Constitutional:      General: She is not in acute distress.    Appearance: She is well-developed.  HENT:     Head: Normocephalic and atraumatic.  Eyes:     Conjunctiva/sclera: Conjunctivae normal.  Neck:     Musculoskeletal: Neck supple.  Cardiovascular:     Rate and Rhythm: Normal rate and regular rhythm.     Heart sounds: No murmur.  Pulmonary:     Effort: Pulmonary effort is normal. No respiratory distress.     Breath sounds: Normal breath sounds.  Abdominal:     Palpations: Abdomen is soft.     Tenderness: There is no abdominal tenderness.  Musculoskeletal: Normal range of motion.         General: No deformity or signs of injury.  Skin:    General: Skin is warm and dry.     Capillary Refill: Capillary refill takes less than 2 seconds.  Neurological:     Mental Status: She is alert and oriented to person, place, and time.     Cranial Nerves: No cranial nerve deficit.     Sensory: No sensory deficit.     Motor: Weakness present.     Comments: She has 4-5 strength of her right upper and lower extremity.  She is got her right pronator drift.  She is slower with rapid alternating movements on the right side.      ED Treatments / Results  Labs (all labs ordered are listed, but only abnormal results are displayed) Labs Reviewed  CBC - Abnormal; Notable for the following components:      Result Value   Platelets 407 (*)    All other components within normal limits  COMPREHENSIVE METABOLIC PANEL - Abnormal; Notable for the following components:   Glucose, Bld 118 (*)    Albumin 3.4 (*)    All other components within normal limits  URINALYSIS, ROUTINE W REFLEX MICROSCOPIC - Abnormal; Notable for the following components:   Specific Gravity, Urine <1.005 (*)    Leukocytes,Ua TRACE (*)    All other components within normal limits  URINALYSIS, MICROSCOPIC (REFLEX) - Abnormal; Notable for the following components:   Bacteria, UA FEW (*)    All other components within normal limits  SARS CORONAVIRUS 2 (TAT 6-24 HRS)  ETHANOL  PROTIME-INR  APTT  DIFFERENTIAL  RAPID URINE DRUG SCREEN, HOSP PERFORMED  HIV ANTIBODY (ROUTINE TESTING W REFLEX)    EKG EKG Interpretation  Date/Time:  Monday April 01 2019 15:33:23 EST Ventricular Rate:  78 PR Interval:    QRS Duration: 92 QT Interval:  361 QTC Calculation: 412 R Axis:   40 Text Interpretation: Sinus rhythm similar to prior 9/14 Confirmed by Aletta Edouard [phone]) on 04/01/2019 3:37:17 PM   Radiology Ct Head Wo Contrast  Result Date: 04/01/2019 CLINICAL DATA:  Onset decreased movement in the right upper and  lower extremities 1 week ago. Altered handwriting. EXAM: CT HEAD WITHOUT CONTRAST TECHNIQUE: Contiguous axial images were obtained from the base of the skull through the vertex without intravenous contrast. COMPARISON:  None. FINDINGS: Brain: There is a large area vasogenic edema in the high left frontal lobe. Hypoattenuation is seen in the basal ganglia bilaterally. No hemorrhage, midline shift or evidence of acute infarct is identified. Vascular: Atherosclerosis noted.  No hyperdense vessel. Skull: Intact.  No focal lesion. Sinuses/Orbits: Negative. Other: None. IMPRESSION: Large area vasogenic edema in  the high left frontal lobe highly suspicious for neoplasm, likely metastatic disease. Brain MRI with and without contrast is recommended for further evaluation. Chest x-ray is also recommended. Hypoattenuation in the basal ganglia bilaterally is likely due to prior lacunar infarctions. These results were called by telephone at the time of interpretation on 04/01/2019 at 3:52 pm to provider [facility] , who verbally acknowledged these results. Electronically Signed   By: Inge Rise M.D.   On: 04/01/2019 15:56   Dg Chest Port 1 View  Result Date: 04/01/2019 CLINICAL DATA:  Decreased right upper extremity and lower extremity movement EXAM: PORTABLE CHEST 1 VIEW COMPARISON:  2014 FINDINGS: Chronic interstitial prominence. Patchy perihilar densities. No pleural effusion or pneumothorax. Normal heart size. Increased right paratracheal density. IMPRESSION: Mild patchy perihilar atelectasis/consolidation. Increased right paratracheal density, which could reflect mediastinal soft tissue. Electronically Signed   By: Macy Mis M.D.   On: 04/01/2019 16:32    Procedures .Critical Care Performed by: Hayden Rasmussen, MD Authorized by: Hayden Rasmussen, MD   Critical care provider statement:    Critical care time (minutes):  45   Critical care time was exclusive of:  Separately billable procedures  and treating other patients   Critical care was necessary to treat or prevent imminent or life-threatening deterioration of the following conditions:  CNS failure or compromise   Critical care was time spent personally by me on the following activities:  Discussions with consultants, evaluation of patient's response to treatment, examination of patient, ordering and performing treatments and interventions, ordering and review of laboratory studies, ordering and review of radiographic studies, pulse oximetry, re-evaluation of patient's condition, obtaining history from patient or surrogate, review of old charts and development of treatment plan with patient or surrogate   I assumed direction of critical care for this patient from another provider in my specialty: no     (including critical care time)  Medications Ordered in ED Medications  levETIRAcetam (KEPPRA) tablet 500 mg (has no administration in time range)     Initial Impression / Assessment and Plan / ED Course  I have reviewed the triage vital signs and the nursing notes.  Pertinent labs & imaging results that were available during my care of the patient were reviewed by me and considered in my medical decision making (see chart for details).  Clinical Course as of Apr 01 1703  Mon Apr 01, 7015  6626 63 year old female no medical history here with right arm and right leg deficits started over a week ago although have worsened today.  Differential includes stroke, demyelinating disease, bleed, peripheral neuropathy.  I have ordered a telestroke consult along with CT head.   [MB]  0940 Patient CT interpreted by me as acute stroke.  Awaiting radiology reading.   [MB]  7680 Received call from radiology that they are calling her CT abnormal, they feel this is a brain tumor possible met.   [MB]  1608 I informed the patient of the results and the CT.  Placed a call into neurosurgery for consult.   [MB]  8811 I interpreted the patient's  chest x-ray as 2 nodules noted.  Awaiting radiology reading.   [MB]  1636 Discussed with Dr. Annette Stable from neurosurgery.  He asked if I could admit the patient to his service at [facility] on a MedSurg bed.   [MB]  0315 Patient's husband in the room and patient asked that I updated him on the test results.  He said that she has been  complaining of some lumps on her chest wall for 1 month plus and the doctor's been slow on ordering her any testing for this.   [MB]  1700 Teleneuro evaluated the patient and are recommending the patient go on Keppra 500 mg twice daily.  Agree with current plan of neurosurgical evaluation for MRI.   [MB]  1701 Patient's lab work fairly unrevealing other than a mild elevation of platelets at 407.  Chest x-ray commenting upon some patchy hilar atelectasis versus consolidation.   [MB]    Clinical Course User Index [MB] Hayden Rasmussen, MD        Final Clinical Impressions(s) / ED Diagnoses   Final diagnoses:  Brain mass  Right arm weakness  Right leg weakness    ED Discharge Orders    None       Hayden Rasmussen, MD 04/02/19 1120

## 2019-04-01 NOTE — ED Notes (Signed)
ED Provider at bedside. 

## 2019-04-01 NOTE — Consult Note (Signed)
[facility] TeleNeurology Consult Services  Stat Consult  Date of Service:   04/01/2019 15:37:18  Impression:     .  C71.1 - Frontal lobe  Comments/Sign-Out: Patient presenting with right sided weakness with CT head showing L front lobe edema with concern for underlying metastatic lesion. Recommend seizure prophylaxis and neurosurgical consult.  CT HEAD: Reviewed Left frontal vasogenic edema concerning for underlying mass  Metrics: [facility] Notification Time: 04/01/2019 15:35:18 Stamp Time: 04/01/2019 15:37:18 Callback Response Time: 04/01/2019 15:38:05 Video Start Time: 04/01/2019 16:45:32  Our recommendations are outlined below.  Recommendations:     .  Start Keppra 500 mg BID     .  Neurosurgical evaluation  Imaging Studies:     .  MRI Head with and Without Contrast  Disposition: Sign Off  Sign Out:     .  Discussed with Emergency Department Provider  ----------------------------------------------------------------------------------------------------  Chief Complaint: right sided weakness  History of Present Illness: Patient is a 63 year old Female.  63 yo F who is presenting with 1 week of RUE and RLE weakness with worsening today. Patient states that her right side got numb a week ago and she started having some trouble with handwriting. She can't lift her right leg anymore to get in the car either. Patient had a stress fracture in her hip a month ago. She has been waiting to get an MRI due to "swelling" of her ribs. She has been on gabapentin for pain. No episodes of loss of consciousness.   Past Medical History:     . Hypertension  Anticoagulant use:  No  Antiplatelet use: No    Examination: BP(117/77), Pulse(73), Blood Glucose(118)  Neuro Exam:  General: Alert,Awake, Oriented to Time, Place, Person  Speech: Fluent:  Language: Intact:  Face: Symmetric:  Facial Sensation: Intact:  Visual Fields:  Intact:  Extraocular Movements: Intact:  Motor Exam: Drift: RUE, RLE  Sensation: Intact:  Coordination: Intact:    Patient/Family was informed the Neurology Consult would happen via TeleHealth consult by way of interactive audio and video telecommunications and consented to receiving care in this manner.  Due to the immediate potential for life-threatening deterioration due to underlying acute neurologic illness, I spent 25 minutes providing critical care. This time includes time for face to face visit via telemedicine, review of medical records, imaging studies and discussion of findings with providers, the patient and/or family.   Dr Carolin Sicks   [facility] [phone]   Case [id number]

## 2019-04-01 NOTE — ED Notes (Signed)
Urine completed per chart.

## 2019-04-01 NOTE — Telephone Encounter (Signed)
Copied from [place] [id number]. Topic: Appointment Scheduling - New Patient >> Apr 01, 2019  9:08 AM Loma [place] wrote: New patient has been scheduled for your office. Provider: Nani Ravens Date of Appointment: 12/18  Route to department's PEC pool.

## 2019-04-01 NOTE — ED Notes (Signed)
Family at bedside. 

## 2019-04-02 ENCOUNTER — Inpatient Hospital Stay (HOSPITAL_COMMUNITY): Payer: BC Managed Care – PPO

## 2019-04-02 ENCOUNTER — Encounter (HOSPITAL_COMMUNITY): Payer: Self-pay | Admitting: Oncology

## 2019-04-02 LAB — HIV ANTIBODY (ROUTINE TESTING W REFLEX): HIV Screen 4th Generation wRfx: NONREACTIVE

## 2019-04-02 MED ORDER — ACETAMINOPHEN 325 MG PO TABS: 650.0000 mg | ORAL_TABLET | Freq: Four times a day (QID) | ORAL | Status: DC | PRN

## 2019-04-02 MED ORDER — MORPHINE SULFATE (PF) 4 MG/ML IV SOLN
4.0000 mg | Freq: Once | INTRAVENOUS | Status: AC
  Administered 2019-04-02: 4 mg via INTRAVENOUS
  Filled 2019-04-02: qty 1

## 2019-04-02 MED ORDER — TRAMADOL HCL 50 MG PO TABS
50.0000 mg | ORAL_TABLET | Freq: Four times a day (QID) | ORAL | Status: DC | PRN
  Administered 2019-04-02 (×2): 50 mg via ORAL
  Filled 2019-04-02 (×2): qty 1

## 2019-04-02 MED ORDER — ONDANSETRON HCL 4 MG PO TABS: 4.0000 mg | ORAL_TABLET | Freq: Three times a day (TID) | ORAL | Status: DC | PRN

## 2019-04-02 MED ORDER — OXYCODONE-ACETAMINOPHEN 5-325 MG PO TABS
1.0000 | ORAL_TABLET | Freq: Four times a day (QID) | ORAL | Status: DC | PRN
  Administered 2019-04-02 – 2019-04-04 (×8): 2 via ORAL
  Filled 2019-04-02 (×8): qty 2

## 2019-04-02 MED ORDER — ONDANSETRON HCL 4 MG/2ML IJ SOLN
4.0000 mg | Freq: Three times a day (TID) | INTRAMUSCULAR | Status: DC | PRN
  Administered 2019-04-05: 4 mg via INTRAVENOUS

## 2019-04-02 MED ORDER — IOHEXOL 300 MG/ML  SOLN
100.0000 mL | Freq: Once | INTRAMUSCULAR | Status: AC | PRN
  Administered 2019-04-02: 100 mL via INTRAVENOUS

## 2019-04-02 MED ORDER — DEXAMETHASONE SODIUM PHOSPHATE 10 MG/ML IJ SOLN
10.0000 mg | Freq: Once | INTRAMUSCULAR | Status: AC
  Administered 2019-04-02: 10 mg via INTRAVENOUS
  Filled 2019-04-02: qty 1

## 2019-04-02 MED ORDER — ACETAMINOPHEN 650 MG RE SUPP: 650.0000 mg | Freq: Four times a day (QID) | RECTAL | Status: DC | PRN

## 2019-04-02 MED ORDER — FENTANYL CITRATE (PF) 100 MCG/2ML IJ SOLN
50.0000 ug | Freq: Once | INTRAMUSCULAR | Status: AC
  Administered 2019-04-02: 50 ug via INTRAVENOUS
  Filled 2019-04-02: qty 2

## 2019-04-02 MED ORDER — POLYETHYLENE GLYCOL 3350 17 G PO PACK: 17.0000 g | PACK | Freq: Every day | ORAL | Status: DC | PRN

## 2019-04-02 MED ORDER — DEXAMETHASONE SODIUM PHOSPHATE 4 MG/ML IJ SOLN
4.0000 mg | Freq: Four times a day (QID) | INTRAMUSCULAR | Status: DC
  Administered 2019-04-02 – 2019-04-05 (×12): 4 mg via INTRAVENOUS
  Filled 2019-04-02 (×12): qty 1

## 2019-04-02 MED ORDER — ONDANSETRON HCL 4 MG/2ML IJ SOLN
4.0000 mg | Freq: Once | INTRAMUSCULAR | Status: AC
  Administered 2019-04-02: 4 mg via INTRAVENOUS
  Filled 2019-04-02: qty 2

## 2019-04-02 NOTE — ED Provider Notes (Signed)
3:00 AM  Pt is a 63 year old female complaining of right arm and leg weakness that started a week ago.  CT of the head concerning for metastatic brain lesion.  No known primary source.  Admitted by Dr. Trenton Gammon with neurosurgery.  It does not appear patient has received Decadron.  Will give IV steroids in the emergency department.  She is on Keppra currently.  Patient complaining of pain in her ribs and shoulder that has been ongoing for weeks.  Provided with fentanyl without much relief.  Will give morphine.  Has a bed available at [facility] but awaiting transport at this time.   Lida Berkery, Delice Bison, DO 04/02/19 [phone]

## 2019-04-02 NOTE — Plan of Care (Signed)
  Problem: Activity: Goal: Risk for activity intolerance will decrease Outcome: Progressing   

## 2019-04-02 NOTE — Consult Note (Addendum)
[place]  [phone] [phone]   MEDICAL ONCOLOGY - INITIAL CONSULTATION  Referral MD: Dr. Earnie Larsson  Reason for Referral: Vasogenic edema in the left frontal lobe of the brain highly suspicious for neoplasm, right medial lobe lung mass, and L3 bone lesion  HPI: Ms. Harlacher is a 63 year old female with a past medical history significant for hypertension.  She presented to the emergency room with complaints of right-sided hemiparesis x1 week.  She has been dragging her right foot when walking and having difficulty lifting her right arm.  He is also having difficulty with fine motor skills in the right hand.  The patient had a CT of the head without contrast which showed a large area of vasogenic edema in the high left frontal lobe highly suspicious for neoplasm, likely metastatic disease.  She had a CT of the chest, abdomen, pelvis with contrast performed earlier today which showed a 3.6 cm spiculated mass in the medial right upper lobe, compatible with primary bronchogenic neoplasm, bilateral pulmonary metastases, associated thoracic nodal metastases including a 10 mm short axis left supraclavicular nodal metastasis which may be amenable to tissue sampling/resection, destructive lytic osseous metastasis along the anterior aspect of the L3 vertebral body.  The patient has been started on dexamethasone 4 mg every 6 hours for vasogenic edema.  An MRI of the brain with and without contrast has been ordered to follow-up on the CT of the head and is currently pending.  When seen today, the patient reports that she is waiting for MRI.  She is hoping to go home later tonight after the MRI results.  She reports ongoing right-sided weakness.  It is really unchanged from admission.  She tells me that she has been having pain in her left breast and left shoulder for several months.  She has noticed some thickening in her left breast.  She had a diagnostic mammogram and  ultrasound of the left breast performed on 12/18/2018.  There was no mammographic evidence for malignancy in either breast.  The tender area of concern was felt to be in the left upper abdomen/lower rib cage and was not within the field of view on the mammogram.  Her gynecologist then referred her to a spine specialist.  Further work-up was planned, but she developed worsening right-sided weakness and presented to the emergency room at [facility].  The patient has not been having any issues with headaches or vision changes.  Denies confusion and seizure-like activity.  No falls.  Denies fevers and chills.  She has not had any anorexia, weight loss, or night sweats.  She denies chest pain, shortness of breath, cough, hemoptysis.  Denies abdominal pain, nausea, vomiting, constipation, diarrhea.  She reports ongoing left shoulder pain, but no back or hip pain.  Denies lower extremity edema.  Denies bleeding.  The patient is married and lives in [place], [place].  She has no children.  She takes care of both of her elderly parents who live with her.  Denies alcohol use.  She currently smokes 1/2 pack of cigarettes per day and reports that she has smoked for 25 to 30 years.  Reports family history of a father with CAD and a brother who died from [place].  Medical oncology was asked see the patient to make recommendations regarding her lung mass, bone lesions, and probable brain metastasis.   Past Medical History:  Diagnosis Date  . Hypertension   :  History reviewed. No pertinent surgical  history.:  Current Facility-Administered Medications  Medication Dose Route Frequency Provider Last Rate Last Dose  . acetaminophen (TYLENOL) tablet 650 mg  650 mg Oral Q6H PRN Viona [name] D, NP       Or  . acetaminophen (TYLENOL) suppository 650 mg  650 mg Rectal Q6H PRN Bergman, Meghan D, NP      . amLODipine (NORVASC) tablet 10 mg  10 mg Oral Daily Bergman, Meghan D, NP   10 mg at 04/02/19 0902   . aspirin EC tablet 81 mg  81 mg Oral Daily Viona [name] D, NP   81 mg at 04/02/19 0903  . dexamethasone (DECADRON) injection 4 mg  4 mg Intravenous Q6H Bergman, Meghan D, NP   4 mg at 04/02/19 1233  . gabapentin (NEURONTIN) capsule 300 mg  300 mg Oral TID PRN Viona [name] D, NP      . hydrALAZINE (APRESOLINE) tablet 25 mg  25 mg Oral BID Viona [name] D, NP   25 mg at 04/02/19 0903  . levETIRAcetam (KEPPRA) tablet 500 mg  500 mg Oral BID Viona [name] D, NP   500 mg at 04/02/19 0903  . ondansetron (ZOFRAN) injection 4 mg  4 mg Intravenous Q8H PRN Bergman, Meghan D, NP       Or  . ondansetron (ZOFRAN) tablet 4 mg  4 mg Oral Q8H PRN Bergman, Meghan D, NP      . oxyCODONE-acetaminophen (PERCOCET/ROXICET) 5-325 MG per tablet 1-2 tablet  1-2 tablet Oral Q6H PRN Viona [name] D, NP   2 tablet at 04/02/19 1009  . polyethylene glycol (MIRALAX / GLYCOLAX) packet 17 g  17 g Oral Daily PRN Bergman, Meghan D, NP      . traMADol (ULTRAM) tablet 50 mg  50 mg Oral Q6H PRN Viona [name] D, NP   50 mg at 04/02/19 0903     Allergies  Allergen Reactions  . Codeine   . Lisinopril-Hydrochlorothiazide Other (See Comments)    Back pain  :  Family History  Problem Relation Age of Onset  . CAD Father        CABG  . Cancer Brother        lymphoma  :  Social History   Socioeconomic History  . Marital status: Married    Spouse name: Not on file  . Number of children: Not on file  . Years of education: Not on file  . Highest education level: Not on file  Occupational History  . Not on file  Social Needs  . Financial resource strain: Not on file  . Food insecurity    Worry: Not on file    Inability: Not on file  . Transportation needs    Medical: Not on file    Non-medical: Not on file  Tobacco Use  . Smoking status: Current Every Day Smoker    Packs/day: 0.50    Years: 25.00    Pack years: 12.50    Types: Cigarettes  . Smokeless tobacco: Never Used  Substance and Sexual  Activity  . Alcohol use: No    Alcohol/week: 0.0 standard drinks  . Drug use: No  . Sexual activity: Yes    Birth control/protection: Post-menopausal  Lifestyle  . Physical activity    Days per week: Not on file    Minutes per session: Not on file  . Stress: Not on file  Relationships  . Social Herbalist on phone: Not on file    Gets together: Not on  file    Attends religious service: Not on file    Active member of club or organization: Not on file    Attends meetings of clubs or organizations: Not on file    Relationship status: Not on file  . Intimate partner violence    Fear of current or ex partner: Not on file    Emotionally abused: Not on file    Physically abused: Not on file    Forced sexual activity: Not on file  Other Topics Concern  . Not on file  Social History Narrative  . Not on file  :  Review of Systems: A comprehensive 14 point review of systems was negative except as noted in the HPI.  Exam: Patient Vitals for the past 24 hrs:  BP Temp Temp src Pulse Resp SpO2 Height Weight  04/02/19 1235 134/82 (!) 97.4 F (36.3 C) Oral - 14 100 % - -  04/02/19 0858 126/83 (!) 97.4 F (36.3 C) Oral 73 12 97 % - -  04/02/19 0558 129/76 97.7 F (36.5 C) Oral 66 16 97 % - -  04/02/19 0430 - - - 61 12 93 % - -  04/02/19 0400 118/66 - - 61 11 90 % - -  04/02/19 0200 113/73 - - 65 17 93 % - -  04/02/19 0142 (!) 118/96 - - 71 (!) 24 95 % - -  04/01/19 2300 115/89 - - 63 19 92 % - -  04/01/19 2200 125/73 - - 68 16 93 % - -  04/01/19 2142 114/73 98.4 F (36.9 C) Oral 68 19 98 % - -  04/01/19 1907 - - - 77 18 94 % - -  04/01/19 1600 - - - 81 18 95 % - -  04/01/19 1546 118/79 - - - - - - -  04/01/19 1535 117/77 - - 73 11 97 % - -  04/01/19 1350 127/83 98.6 F (37 C) Oral 86 18 98 % 5' 7" (1.702 m) 134 lb (60.8 kg)    General:  well-nourished in no acute distress.   Eyes: EOMI.  PERRL.  No scleral icterus.   ENT:  There were no oropharyngeal lesions.    Neck was without thyromegaly.   Lymphatics:  Negative cervical, supraclavicular or axillary adenopathy.  Breasts: No palpable masses in the right breast.  Left breast with thickening lower half of the breast, ?  Tender area on rib cage Respiratory: lungs were clear bilaterally without wheezing or crackles.   Cardiovascular:  Regular rate and rhythm, S1/S2, without murmur, rub or gallop.  There was no pedal edema.   GI:  abdomen was soft, flat, nontender, nondistended, without organomegaly.   Musculoskeletal: Tenderness noted to the left scapula.  No spinal tenderness with palpation. Skin exam was without echymosis, petichae.   Neuro: Patient was alert and oriented.  Attention was good.   Language was appropriate.  Mood was normal without depression.  Speech was not pressured.  Thought content was not tangential.  The patient had decreased general strength in the right upper and right lower extremity.  Sensation intact.   Lab Results  Component Value Date   WBC 9.3 04/01/2019   HGB 13.6 04/01/2019   HCT 41.0 04/01/2019   PLT 407 (H) 04/01/2019   GLUCOSE 118 (H) 04/01/2019   CHOL 266 (HH) 05/29/2008   TRIG 70 05/29/2008   HDL 120.2 05/29/2008   LDLDIRECT 130.1 05/29/2008   ALT 12 04/01/2019   AST 15 04/01/2019  NA 141 04/01/2019   K 3.8 04/01/2019   CL 107 04/01/2019   CREATININE 0.76 04/01/2019   BUN 16 04/01/2019   CO2 23 04/01/2019    Ct Head Wo Contrast  Result Date: 04/01/2019 CLINICAL DATA:  Onset decreased movement in the right upper and lower extremities 1 week ago. Altered handwriting. EXAM: CT HEAD WITHOUT CONTRAST TECHNIQUE: Contiguous axial images were obtained from the base of the skull through the vertex without intravenous contrast. COMPARISON:  None. FINDINGS: Brain: There is a large area vasogenic edema in the high left frontal lobe. Hypoattenuation is seen in the basal ganglia bilaterally. No hemorrhage, midline shift or evidence of acute infarct is identified.  Vascular: Atherosclerosis noted.  No hyperdense vessel. Skull: Intact.  No focal lesion. Sinuses/Orbits: Negative. Other: None. IMPRESSION: Large area vasogenic edema in the high left frontal lobe highly suspicious for neoplasm, likely metastatic disease. Brain MRI with and without contrast is recommended for further evaluation. Chest x-ray is also recommended. Hypoattenuation in the basal ganglia bilaterally is likely due to prior lacunar infarctions. These results were called by telephone at the time of interpretation on 04/01/2019 at 3:52 pm to provider [facility] , who verbally acknowledged these results. Electronically Signed   By: Inge Rise M.D.   On: 04/01/2019 15:56   Ct Chest W Contrast  Result Date: 04/02/2019 CLINICAL DATA:  Cancer screening EXAM: CT CHEST, ABDOMEN, AND PELVIS WITH CONTRAST TECHNIQUE: Multidetector CT imaging of the chest, abdomen and pelvis was performed following the standard protocol during bolus administration of intravenous contrast. CONTRAST:  173m OMNIPAQUE IOHEXOL 300 MG/ML  SOLN COMPARISON:  Chest radiograph dated 04/01/2019 FINDINGS: CT CHEST FINDINGS Cardiovascular: The heart is normal in size. No pericardial effusion. No evidence of thoracic aortic aneurysm. Atherosclerotic calcifications of the aortic arch. Three vessel coronary atherosclerosis. Mediastinum/Nodes: Thoracic lymphadenopathy, including: --10 mm short axis left supraclavicular node (series 3/image 12) --3.3 cm short axis high right paratracheal node (series 3/image 18) --2.1 cm short axis anterior mediastinal node (series 3/image 20) --2.9 cm short axis low right paratracheal node (series 3/image 23) --1.7 cm short axis mid parasymphyseal node (series 3/image 32) Visualized thyroid is unremarkable. Lungs/Pleura: 3.0 x 2.4 x 3.6 cm spiculated mass in the medial right upper lobe (series 5/image 38), compatible with primary bronchogenic neoplasm. Additional bilateral pulmonary metastases in the left  upper lobe and bilateral lower lobes, approximately 20-25 in number, including: --17 mm left upper lobe nodule (series 5/image 69) --15 mm right upper lobe nodule (series 5/image 75) --16 mm medial left lower lobe nodule (series 5/image 102) Mild centrilobular and paraseptal emphysematous changes, upper lung predominant. No focal consolidation. No pleural effusion or pneumothorax. Musculoskeletal: Subtle sclerosis involving the superior endplate of TF75and TZ02(sagittal images 82 and 86), nonspecific. CT ABDOMEN PELVIS FINDINGS Hepatobiliary: 8 mm cyst in segment 8 (series 3/image 51). Additional 7 mm hypoenhancing lesion in segment 8 (series 3/image 84), indeterminate. 15 mm lesion at the junction of segment 5/6 (series 3/image 89), with suspected peripheral nodular discontinuous enhancement, suggesting a benign hemangioma. Technically, this remains incompletely characterized. Gallbladder is unremarkable. No intrahepatic or extrahepatic ductal dilatation. Pancreas: Within normal limits. Spleen: Within normal limits. Adrenals/Urinary Tract: Mild thickening of the left adrenal gland, without discrete mass. Right adrenal gland is within normal limits. 8 mm medial left upper pole renal cyst (series 8/image 7). Right kidney is within normal limits. No hydronephrosis. Bladder is within normal limits. Stomach/Bowel: Stomach is within normal limits. No evidence of bowel obstruction.  Appendix is not discretely visualized. Vascular/Lymphatic: No evidence of abdominal aortic aneurysm. Atherosclerotic calcifications of the abdominal aorta and branch vessels. No suspicious abdominopelvic lymphadenopathy. Reproductive: Uterine fibroids. Bilateral ovaries are within normal limits. Other: No abdominopelvic ascites. Musculoskeletal: Destructive lytic metastasis along the anterior aspect of the L3 vertebral body (sagittal image 77). IMPRESSION: 3.6 cm spiculated mass in the medial right upper lobe, compatible with primary  bronchogenic neoplasm. Bilateral pulmonary metastases. Associated thoracic nodal metastases, including a 10 mm short axis left supraclavicular nodal metastasis which may be amenable to tissue sampling/resection. Destructive lytic osseous metastasis along the anterior aspect of the L3 vertebral body. Electronically Signed   By: Julian Hy M.D.   On: 04/02/2019 12:18   Ct Abdomen Pelvis W Contrast  Result Date: 04/02/2019 CLINICAL DATA:  Cancer screening EXAM: CT CHEST, ABDOMEN, AND PELVIS WITH CONTRAST TECHNIQUE: Multidetector CT imaging of the chest, abdomen and pelvis was performed following the standard protocol during bolus administration of intravenous contrast. CONTRAST:  169m OMNIPAQUE IOHEXOL 300 MG/ML  SOLN COMPARISON:  Chest radiograph dated 04/01/2019 FINDINGS: CT CHEST FINDINGS Cardiovascular: The heart is normal in size. No pericardial effusion. No evidence of thoracic aortic aneurysm. Atherosclerotic calcifications of the aortic arch. Three vessel coronary atherosclerosis. Mediastinum/Nodes: Thoracic lymphadenopathy, including: --10 mm short axis left supraclavicular node (series 3/image 12) --3.3 cm short axis high right paratracheal node (series 3/image 18) --2.1 cm short axis anterior mediastinal node (series 3/image 20) --2.9 cm short axis low right paratracheal node (series 3/image 23) --1.7 cm short axis mid parasymphyseal node (series 3/image 32) Visualized thyroid is unremarkable. Lungs/Pleura: 3.0 x 2.4 x 3.6 cm spiculated mass in the medial right upper lobe (series 5/image 38), compatible with primary bronchogenic neoplasm. Additional bilateral pulmonary metastases in the left upper lobe and bilateral lower lobes, approximately 20-25 in number, including: --17 mm left upper lobe nodule (series 5/image 69) --15 mm right upper lobe nodule (series 5/image 75) --16 mm medial left lower lobe nodule (series 5/image 102) Mild centrilobular and paraseptal emphysematous changes, upper lung  predominant. No focal consolidation. No pleural effusion or pneumothorax. Musculoskeletal: Subtle sclerosis involving the superior endplate of TE07and TH21(sagittal images 82 and 86), nonspecific. CT ABDOMEN PELVIS FINDINGS Hepatobiliary: 8 mm cyst in segment 8 (series 3/image 51). Additional 7 mm hypoenhancing lesion in segment 8 (series 3/image 84), indeterminate. 15 mm lesion at the junction of segment 5/6 (series 3/image 89), with suspected peripheral nodular discontinuous enhancement, suggesting a benign hemangioma. Technically, this remains incompletely characterized. Gallbladder is unremarkable. No intrahepatic or extrahepatic ductal dilatation. Pancreas: Within normal limits. Spleen: Within normal limits. Adrenals/Urinary Tract: Mild thickening of the left adrenal gland, without discrete mass. Right adrenal gland is within normal limits. 8 mm medial left upper pole renal cyst (series 8/image 7). Right kidney is within normal limits. No hydronephrosis. Bladder is within normal limits. Stomach/Bowel: Stomach is within normal limits. No evidence of bowel obstruction. Appendix is not discretely visualized. Vascular/Lymphatic: No evidence of abdominal aortic aneurysm. Atherosclerotic calcifications of the abdominal aorta and branch vessels. No suspicious abdominopelvic lymphadenopathy. Reproductive: Uterine fibroids. Bilateral ovaries are within normal limits. Other: No abdominopelvic ascites. Musculoskeletal: Destructive lytic metastasis along the anterior aspect of the L3 vertebral body (sagittal image 77). IMPRESSION: 3.6 cm spiculated mass in the medial right upper lobe, compatible with primary bronchogenic neoplasm. Bilateral pulmonary metastases. Associated thoracic nodal metastases, including a 10 mm short axis left supraclavicular nodal metastasis which may be amenable to tissue sampling/resection. Destructive lytic osseous metastasis along  the anterior aspect of the L3 vertebral body. Electronically  Signed   By: Julian Hy M.D.   On: 04/02/2019 12:18   Dg Chest Port 1 View  Result Date: 04/01/2019 CLINICAL DATA:  Decreased right upper extremity and lower extremity movement EXAM: PORTABLE CHEST 1 VIEW COMPARISON:  2014 FINDINGS: Chronic interstitial prominence. Patchy perihilar densities. No pleural effusion or pneumothorax. Normal heart size. Increased right paratracheal density. IMPRESSION: Mild patchy perihilar atelectasis/consolidation. Increased right paratracheal density, which could reflect mediastinal soft tissue. Electronically Signed   By: Macy Mis M.D.   On: 04/01/2019 16:32     Ct Head Wo Contrast  Result Date: 04/01/2019 CLINICAL DATA:  Onset decreased movement in the right upper and lower extremities 1 week ago. Altered handwriting. EXAM: CT HEAD WITHOUT CONTRAST TECHNIQUE: Contiguous axial images were obtained from the base of the skull through the vertex without intravenous contrast. COMPARISON:  None. FINDINGS: Brain: There is a large area vasogenic edema in the high left frontal lobe. Hypoattenuation is seen in the basal ganglia bilaterally. No hemorrhage, midline shift or evidence of acute infarct is identified. Vascular: Atherosclerosis noted.  No hyperdense vessel. Skull: Intact.  No focal lesion. Sinuses/Orbits: Negative. Other: None. IMPRESSION: Large area vasogenic edema in the high left frontal lobe highly suspicious for neoplasm, likely metastatic disease. Brain MRI with and without contrast is recommended for further evaluation. Chest x-ray is also recommended. Hypoattenuation in the basal ganglia bilaterally is likely due to prior lacunar infarctions. These results were called by telephone at the time of interpretation on 04/01/2019 at 3:52 pm to provider [facility] , who verbally acknowledged these results. Electronically Signed   By: Inge Rise M.D.   On: 04/01/2019 15:56   Ct Chest W Contrast  Result Date: 04/02/2019 CLINICAL DATA:  Cancer  screening EXAM: CT CHEST, ABDOMEN, AND PELVIS WITH CONTRAST TECHNIQUE: Multidetector CT imaging of the chest, abdomen and pelvis was performed following the standard protocol during bolus administration of intravenous contrast. CONTRAST:  141m OMNIPAQUE IOHEXOL 300 MG/ML  SOLN COMPARISON:  Chest radiograph dated 04/01/2019 FINDINGS: CT CHEST FINDINGS Cardiovascular: The heart is normal in size. No pericardial effusion. No evidence of thoracic aortic aneurysm. Atherosclerotic calcifications of the aortic arch. Three vessel coronary atherosclerosis. Mediastinum/Nodes: Thoracic lymphadenopathy, including: --10 mm short axis left supraclavicular node (series 3/image 12) --3.3 cm short axis high right paratracheal node (series 3/image 18) --2.1 cm short axis anterior mediastinal node (series 3/image 20) --2.9 cm short axis low right paratracheal node (series 3/image 23) --1.7 cm short axis mid parasymphyseal node (series 3/image 32) Visualized thyroid is unremarkable. Lungs/Pleura: 3.0 x 2.4 x 3.6 cm spiculated mass in the medial right upper lobe (series 5/image 38), compatible with primary bronchogenic neoplasm. Additional bilateral pulmonary metastases in the left upper lobe and bilateral lower lobes, approximately 20-25 in number, including: --17 mm left upper lobe nodule (series 5/image 69) --15 mm right upper lobe nodule (series 5/image 75) --16 mm medial left lower lobe nodule (series 5/image 102) Mild centrilobular and paraseptal emphysematous changes, upper lung predominant. No focal consolidation. No pleural effusion or pneumothorax. Musculoskeletal: Subtle sclerosis involving the superior endplate of TZ12and TW58(sagittal images 82 and 86), nonspecific. CT ABDOMEN PELVIS FINDINGS Hepatobiliary: 8 mm cyst in segment 8 (series 3/image 51). Additional 7 mm hypoenhancing lesion in segment 8 (series 3/image 84), indeterminate. 15 mm lesion at the junction of segment 5/6 (series 3/image 89), with suspected  peripheral nodular discontinuous enhancement, suggesting a benign hemangioma. Technically, this  remains incompletely characterized. Gallbladder is unremarkable. No intrahepatic or extrahepatic ductal dilatation. Pancreas: Within normal limits. Spleen: Within normal limits. Adrenals/Urinary Tract: Mild thickening of the left adrenal gland, without discrete mass. Right adrenal gland is within normal limits. 8 mm medial left upper pole renal cyst (series 8/image 7). Right kidney is within normal limits. No hydronephrosis. Bladder is within normal limits. Stomach/Bowel: Stomach is within normal limits. No evidence of bowel obstruction. Appendix is not discretely visualized. Vascular/Lymphatic: No evidence of abdominal aortic aneurysm. Atherosclerotic calcifications of the abdominal aorta and branch vessels. No suspicious abdominopelvic lymphadenopathy. Reproductive: Uterine fibroids. Bilateral ovaries are within normal limits. Other: No abdominopelvic ascites. Musculoskeletal: Destructive lytic metastasis along the anterior aspect of the L3 vertebral body (sagittal image 77). IMPRESSION: 3.6 cm spiculated mass in the medial right upper lobe, compatible with primary bronchogenic neoplasm. Bilateral pulmonary metastases. Associated thoracic nodal metastases, including a 10 mm short axis left supraclavicular nodal metastasis which may be amenable to tissue sampling/resection. Destructive lytic osseous metastasis along the anterior aspect of the L3 vertebral body. Electronically Signed   By: Julian Hy M.D.   On: 04/02/2019 12:18   Ct Abdomen Pelvis W Contrast  Result Date: 04/02/2019 CLINICAL DATA:  Cancer screening EXAM: CT CHEST, ABDOMEN, AND PELVIS WITH CONTRAST TECHNIQUE: Multidetector CT imaging of the chest, abdomen and pelvis was performed following the standard protocol during bolus administration of intravenous contrast. CONTRAST:  155m OMNIPAQUE IOHEXOL 300 MG/ML  SOLN COMPARISON:  Chest radiograph  dated 04/01/2019 FINDINGS: CT CHEST FINDINGS Cardiovascular: The heart is normal in size. No pericardial effusion. No evidence of thoracic aortic aneurysm. Atherosclerotic calcifications of the aortic arch. Three vessel coronary atherosclerosis. Mediastinum/Nodes: Thoracic lymphadenopathy, including: --10 mm short axis left supraclavicular node (series 3/image 12) --3.3 cm short axis high right paratracheal node (series 3/image 18) --2.1 cm short axis anterior mediastinal node (series 3/image 20) --2.9 cm short axis low right paratracheal node (series 3/image 23) --1.7 cm short axis mid parasymphyseal node (series 3/image 32) Visualized thyroid is unremarkable. Lungs/Pleura: 3.0 x 2.4 x 3.6 cm spiculated mass in the medial right upper lobe (series 5/image 38), compatible with primary bronchogenic neoplasm. Additional bilateral pulmonary metastases in the left upper lobe and bilateral lower lobes, approximately 20-25 in number, including: --17 mm left upper lobe nodule (series 5/image 69) --15 mm right upper lobe nodule (series 5/image 75) --16 mm medial left lower lobe nodule (series 5/image 102) Mild centrilobular and paraseptal emphysematous changes, upper lung predominant. No focal consolidation. No pleural effusion or pneumothorax. Musculoskeletal: Subtle sclerosis involving the superior endplate of TY92and TK46(sagittal images 82 and 86), nonspecific. CT ABDOMEN PELVIS FINDINGS Hepatobiliary: 8 mm cyst in segment 8 (series 3/image 51). Additional 7 mm hypoenhancing lesion in segment 8 (series 3/image 84), indeterminate. 15 mm lesion at the junction of segment 5/6 (series 3/image 89), with suspected peripheral nodular discontinuous enhancement, suggesting a benign hemangioma. Technically, this remains incompletely characterized. Gallbladder is unremarkable. No intrahepatic or extrahepatic ductal dilatation. Pancreas: Within normal limits. Spleen: Within normal limits. Adrenals/Urinary Tract: Mild thickening of  the left adrenal gland, without discrete mass. Right adrenal gland is within normal limits. 8 mm medial left upper pole renal cyst (series 8/image 7). Right kidney is within normal limits. No hydronephrosis. Bladder is within normal limits. Stomach/Bowel: Stomach is within normal limits. No evidence of bowel obstruction. Appendix is not discretely visualized. Vascular/Lymphatic: No evidence of abdominal aortic aneurysm. Atherosclerotic calcifications of the abdominal aorta and branch vessels. No suspicious abdominopelvic lymphadenopathy.  Reproductive: Uterine fibroids. Bilateral ovaries are within normal limits. Other: No abdominopelvic ascites. Musculoskeletal: Destructive lytic metastasis along the anterior aspect of the L3 vertebral body (sagittal image 77). IMPRESSION: 3.6 cm spiculated mass in the medial right upper lobe, compatible with primary bronchogenic neoplasm. Bilateral pulmonary metastases. Associated thoracic nodal metastases, including a 10 mm short axis left supraclavicular nodal metastasis which may be amenable to tissue sampling/resection. Destructive lytic osseous metastasis along the anterior aspect of the L3 vertebral body. Electronically Signed   By: Julian Hy M.D.   On: 04/02/2019 12:18   Dg Chest Port 1 View  Result Date: 04/01/2019 CLINICAL DATA:  Decreased right upper extremity and lower extremity movement EXAM: PORTABLE CHEST 1 VIEW COMPARISON:  2014 FINDINGS: Chronic interstitial prominence. Patchy perihilar densities. No pleural effusion or pneumothorax. Normal heart size. Increased right paratracheal density. IMPRESSION: Mild patchy perihilar atelectasis/consolidation. Increased right paratracheal density, which could reflect mediastinal soft tissue. Electronically Signed   By: Macy Mis M.D.   On: 04/01/2019 16:32   Assessment and Plan:  Vasogenic edema in the brain suspicious for brain metastasis, right upper lobe lung mass, and L3 bone lesion highly suspicious  for metastatic lung cancer (non-small cell versus small cell) - I have discussed the CT findings with the patient and that her results are concerning for metastatic lung cancer.  We discussed that we need additional information including the MRI of the brain with and without contrast which will hopefully be performed later today.  We also need to obtain a biopsy to confirm the diagnosis.   -We are awaiting further recommendations from neurosurgery as to whether surgery is indicated.  If they do not plan to perform any surgical intervention, we can ask interventional radiology to biopsy the left supraclavicular nodal metastasis.   -Once we have a formal diagnosis, we will have a more detailed discussion with the patient regarding treatment options.   -We will also need to get radiation oncology involved for consideration of SRS versus whole brain radiation. -Continue dexamethasone 4 mg every 6 hours. -The patient lives in [place], [place] and prefers to be followed at [facility] which is very close to her home.  We will make these arrangements.  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   Left breast pain and thickening, ? rib lesion  -No breast abnormality noted on recent mammogram in August 2020. -Unsure if this area is related to her metastatic cancer  Hypertension -Blood pressure currently controlled with amlodipine and hydralazine  Thank you for this referral.   Mikey Bussing, DNP, AGPCNP-BC, AOCNP  ADDENDUM: I saw and examined Ms. Riche.  She is very nice.  It was certainly wonderful talking with her.  This clearly is stage IV bronchogenic carcinoma.  We just do not know the histology of the carcinoma.  I would favor, if possible, removing the solitary brain met.  This is causing a lot of symptoms for her.  She has a lot of edema because of this.  If we get out the brain metastasis, we  would have a lot of material to be able to run our molecular studies which is critical for bronchogenic carcinoma.  By the appearance on the CT scan of the chest, this might be small cell lung cancer.  I do not think we need to do any other radiologic studies.  A PET scan would certainly be helpful but this can only be done  as an outpatient.  I think if surgery for removal of the solitary brain metastasis is not possible, then bronchoscopy with biopsies would certainly be reasonable and should be able to get material for our molecular studies.  I talked to her at length.  I think she had a good understanding of what we were talking about.  I told her that we are dealing with stage IV lung cancer.  This is treatable but not curable.  I think that if she is not a candidate for surgery for the brain, then I would think stereotactic radiosurgery would be a reasonable way to go.  We will follow her along.  Again, the issue is tissue at this point.  We need to get something so that we can know what we are dealing with.  If this is not small cell lung cancer, then I would think squamous cell carcinoma would be the next most likely possibility.    This is all about quality of life at this point.  She has to take care of her elderly parents.  She does not have any children.  She does have siblings but I am not sure of the living this area.  She is originally from [place], [place].  Lattie Haw, MD  Oswaldo Milian 7:14

## 2019-04-02 NOTE — ED Notes (Signed)
Pt removed all monitoring equipment.

## 2019-04-02 NOTE — ED Notes (Signed)
Pt's O2 sat noted to be 91% on RA w uniform pleth.  2L O2 via Ruth placed on pt w/ improvement in O2 sat to 96%.

## 2019-04-02 NOTE — H&P (Signed)
[name] is an 63 y.o. female.   Chief Complaint: Right-sided hemiparesis HPI: Patient with a history of  Hypertension. She reports a one week history of significant right-sided weakness. She has been dragging her right foot when walking and having difficulty lifting her right arm. She notes difficulty with fine motor skills on her right hand. She denies difficulty with speech, changes in vision, or facial droop. She reports she is being followed by a spine doctor for scoliosis. She also has had an issue of left-sided pain and inflammation just below her left breast. Her provider was going to order imaging for this.  Past Medical History:  Diagnosis Date  . Hypertension     History reviewed. No pertinent surgical history.  Family History  Problem Relation Age of Onset  . CAD Father        CABG  . Cancer Brother        lymphoma   Social History:  reports that she has been smoking cigarettes. She has a 12.50 pack-year smoking history. She has never used smokeless tobacco. She reports that she does not drink alcohol or use drugs.  Allergies:  Allergies  Allergen Reactions  . Codeine   . Lisinopril-Hydrochlorothiazide Other (See Comments)    Back pain    Medications Prior to Admission  Medication Sig Dispense Refill  . amLODipine (NORVASC) 10 MG tablet TAKE 1 TABLET (10 MG TOTAL) BY MOUTH DAILY. NEED OFFICE VISIT 90 tablet 0  . aspirin 81 MG tablet Take 81 mg by mouth daily.    Marland Kitchen gabapentin (NEURONTIN) 300 MG capsule Take 300 mg by mouth 3 (three) times daily as needed.    . hydrALAZINE (APRESOLINE) 25 MG tablet TAKE 1 TABLET BY MOUTH TWICE A DAY 60 tablet 12    Results for orders placed or performed during the hospital encounter of 04/01/19 (from the past 48 hour(s))  Protime-INR     Status: None   Collection Time: 04/01/19  3:33 PM  Result Value Ref Range   Prothrombin Time 12.9 11.4 - 15.2 seconds   INR 1.0 0.8 - 1.2    Comment: (NOTE) INR goal varies based on device  and disease states. Performed at [facility], [address] [place], [place] [address]   APTT     Status: None   Collection Time: 04/01/19  3:33 PM  Result Value Ref Range   aPTT 27 24 - 36 seconds    Comment: Performed at [facility], [address] [place], [place] [address]  CBC     Status: Abnormal   Collection Time: 04/01/19  3:33 PM  Result Value Ref Range   WBC 9.3 4.0 - 10.5 K/uL   RBC 4.42 3.87 - 5.11 MIL/uL   Hemoglobin 13.6 12.0 - 15.0 g/dL   HCT 41.0 36.0 - 46.0 %   MCV 92.8 80.0 - 100.0 fL   MCH 30.8 26.0 - 34.0 pg   MCHC 33.2 30.0 - 36.0 g/dL   RDW 12.1 11.5 - 15.5 %   Platelets 407 (H) 150 - 400 K/uL   nRBC 0.0 0.0 - 0.2 %    Comment: Performed at [facility], [address] [place], [place] [address]  Differential     Status: None   Collection Time: 04/01/19  3:33 PM  Result Value Ref Range   Neutrophils Relative % 73 %   Neutro Abs 6.8 1.7 - 7.7 K/uL   Lymphocytes Relative 20 %  Lymphs Abs 1.8 0.7 - 4.0 K/uL   Monocytes Relative 6 %   Monocytes Absolute 0.5 0.1 - 1.0 K/uL   Eosinophils Relative 1 %   Eosinophils Absolute 0.1 0.0 - 0.5 K/uL   Basophils Relative 0 %   Basophils Absolute 0.0 0.0 - 0.1 K/uL   Immature Granulocytes 0 %   Abs Immature Granulocytes 0.04 0.00 - 0.07 K/uL    Comment: Performed at [facility], [address] [place], [place] [address]  Comprehensive metabolic panel     Status: Abnormal   Collection Time: 04/01/19  3:33 PM  Result Value Ref Range   Sodium 141 135 - 145 mmol/L   Potassium 3.8 3.5 - 5.1 mmol/L   Chloride 107 98 - 111 mmol/L   CO2 23 22 - 32 mmol/L   Glucose, Bld 118 (H) 70 - 99 mg/dL   BUN 16 8 - 23 mg/dL   Creatinine, Ser 0.76 0.44 - 1.00 mg/dL   Calcium 9.3 8.9 - 10.3 mg/dL   Total Protein 7.0 6.5 - 8.1 g/dL   Albumin 3.4 (L) 3.5 - 5.0 g/dL   AST 15 15 - 41 U/L   ALT 12 0 - 44 U/L   Alkaline Phosphatase 108 38 - 126 U/L   Total Bilirubin 0.3 0.3 - 1.2  mg/dL   GFR calc non Af Amer >60 >60 mL/min   GFR calc Af Amer >60 >60 mL/min   Anion gap 11 5 - 15    Comment: Performed at [facility], [address] [place], [place] [address]  Ethanol     Status: None   Collection Time: 04/01/19  4:15 PM  Result Value Ref Range   Alcohol, Ethyl (B) <10 <10 mg/dL    Comment: (NOTE) Lowest detectable limit for serum alcohol is 10 mg/dL. For medical purposes only. Performed at [facility], [address] [place], [place] [address]   SARS CORONAVIRUS 2 (TAT 6-24 HRS) Nasopharyngeal Nasopharyngeal Swab     Status: None   Collection Time: 04/01/19  4:15 PM   Specimen: Nasopharyngeal Swab  Result Value Ref Range   SARS Coronavirus 2 NEGATIVE NEGATIVE    Comment: (NOTE) SARS-CoV-2 target nucleic acids are NOT DETECTED. The SARS-CoV-2 RNA is generally detectable in upper and lower respiratory specimens during the acute phase of infection. Negative results do not preclude SARS-CoV-2 infection, do not rule out co-infections with other pathogens, and should not be used as the sole basis for treatment or other patient management decisions. Negative results must be combined with clinical observations, patient history, and epidemiological information. The expected result is Negative. Fact Sheet for Patients: SugarRoll.be Fact Sheet for Healthcare Providers: https://www.woods-mathews.com/ This test is not yet approved or cleared by the [place] FDA and  has been authorized for detection and/or diagnosis of SARS-CoV-2 by FDA under an Emergency Use Authorization (EUA). This EUA will remain  in effect (meaning this test can be used) for the duration of the COVID-19 declaration under Section 56 4(b)(1) of the Act, 21 U.S.C. section 360bbb-3(b)(1), unless the authorization is terminated or revoked sooner. Performed at [facility], [place] [address], [place], [place] [address]    Urine rapid drug screen (hosp performed)     Status: None   Collection Time: 04/01/19  5:10 PM  Result Value Ref Range   Opiates NONE DETECTED NONE DETECTED   Cocaine NONE DETECTED NONE DETECTED   Benzodiazepines NONE DETECTED NONE DETECTED  Amphetamines NONE DETECTED NONE DETECTED   Tetrahydrocannabinol NONE DETECTED NONE DETECTED   Barbiturates NONE DETECTED NONE DETECTED    Comment: (NOTE) DRUG SCREEN FOR MEDICAL PURPOSES ONLY.  IF CONFIRMATION IS NEEDED FOR ANY PURPOSE, NOTIFY LAB WITHIN 5 DAYS. LOWEST DETECTABLE LIMITS FOR URINE DRUG SCREEN Drug Class                     Cutoff (ng/mL) Amphetamine and metabolites    1000 Barbiturate and metabolites    200 Benzodiazepine                 798 Tricyclics and metabolites     300 Opiates and metabolites        300 Cocaine and metabolites        300 THC                            50 Performed at [facility], [address] [place], [place] [address]   Urinalysis, Routine w reflex microscopic     Status: Abnormal   Collection Time: 04/01/19  5:10 PM  Result Value Ref Range   Color, Urine YELLOW YELLOW   APPearance CLEAR CLEAR   Specific Gravity, Urine <1.005 (L) 1.005 - 1.030   pH 6.0 5.0 - 8.0   Glucose, UA NEGATIVE NEGATIVE mg/dL   Hgb urine dipstick NEGATIVE NEGATIVE   Bilirubin Urine NEGATIVE NEGATIVE   Ketones, ur NEGATIVE NEGATIVE mg/dL   Protein, ur NEGATIVE NEGATIVE mg/dL   Nitrite NEGATIVE NEGATIVE   Leukocytes,Ua TRACE (A) NEGATIVE    Comment: Performed at [facility], [address] [place], [place] [address]  Urinalysis, Microscopic (reflex)     Status: Abnormal   Collection Time: 04/01/19  5:10 PM  Result Value Ref Range   RBC / HPF NONE SEEN 0 - 5 RBC/hpf   WBC, UA 6-10 0 - 5 WBC/hpf   Bacteria, UA FEW (A) NONE SEEN   Squamous Epithelial / LPF 0-5 0 - 5    Comment: Performed at [facility], [address] [place], [place] [address]   Ct Head Wo Contrast  Result  Date: 04/01/2019 CLINICAL DATA:  Onset decreased movement in the right upper and lower extremities 1 week ago. Altered handwriting. EXAM: CT HEAD WITHOUT CONTRAST TECHNIQUE: Contiguous axial images were obtained from the base of the skull through the vertex without intravenous contrast. COMPARISON:  None. FINDINGS: Brain: There is a large area vasogenic edema in the high left frontal lobe. Hypoattenuation is seen in the basal ganglia bilaterally. No hemorrhage, midline shift or evidence of acute infarct is identified. Vascular: Atherosclerosis noted.  No hyperdense vessel. Skull: Intact.  No focal lesion. Sinuses/Orbits: Negative. Other: None. IMPRESSION: Large area vasogenic edema in the high left frontal lobe highly suspicious for neoplasm, likely metastatic disease. Brain MRI with and without contrast is recommended for further evaluation. Chest x-ray is also recommended. Hypoattenuation in the basal ganglia bilaterally is likely due to prior lacunar infarctions. These results were called by telephone at the time of interpretation on 04/01/2019 at 3:52 pm to provider [facility] , who verbally acknowledged these results. Electronically Signed   By: Inge Rise M.D.   On: 04/01/2019 15:56   Dg Chest Port 1 View  Result Date: 04/01/2019 CLINICAL DATA:  Decreased right upper extremity and lower extremity movement EXAM: PORTABLE CHEST 1 VIEW COMPARISON:  2014 FINDINGS: Chronic  interstitial prominence. Patchy perihilar densities. No pleural effusion or pneumothorax. Normal heart size. Increased right paratracheal density. IMPRESSION: Mild patchy perihilar atelectasis/consolidation. Increased right paratracheal density, which could reflect mediastinal soft tissue. Electronically Signed   By: Macy Mis M.D.   On: 04/01/2019 16:32    Review of Systems  Constitutional: Negative for chills, fever, malaise/fatigue and weight loss.  HENT: Negative for congestion, ear pain, hearing loss and sinus pain.    Eyes: Negative for blurred vision, double vision and photophobia.  Respiratory: Negative for cough, hemoptysis, sputum production, shortness of breath and wheezing.   Cardiovascular: Negative for chest pain, palpitations, orthopnea, claudication and leg swelling.  Gastrointestinal: Negative for abdominal pain, blood in stool, constipation, diarrhea, heartburn, melena, nausea and vomiting.  Genitourinary: Negative for dysuria, flank pain, frequency, hematuria and urgency.  Musculoskeletal: Positive for back pain. Negative for falls, joint pain, myalgias and neck pain.  Skin: Negative.   Neurological: Positive for focal weakness. Negative for dizziness, tingling, tremors, sensory change, speech change, seizures, loss of consciousness and headaches.  Endo/Heme/Allergies: Negative for environmental allergies and polydipsia. Does not bruise/bleed easily.  Psychiatric/Behavioral: Negative for depression, hallucinations, memory loss, substance abuse and suicidal ideas. The patient is not nervous/anxious and does not have insomnia.     Blood pressure 129/76, pulse 66, temperature 97.7 F (36.5 C), temperature source Oral, resp. rate 16, height 5\' 7"  (1.702 m), weight 60.8 kg, SpO2 97 %. Physical Exam  Constitutional: She is oriented to person, place, and time. She appears well-developed and well-nourished.  HENT:  Head: Normocephalic and atraumatic.  Eyes: Pupils are equal, round, and reactive to light. Conjunctivae and EOM are normal.  Neck: Normal range of motion. Neck supple.  Cardiovascular: Normal rate and regular rhythm.  Respiratory: Effort normal and breath sounds normal. No respiratory distress.  GI: Soft. Bowel sounds are normal. She exhibits no distension.    Musculoskeletal:     Right shoulder: She exhibits decreased strength.     Right hip: She exhibits decreased strength.  Neurological: She is alert and oriented to person, place, and time. No sensory deficit. GCS eye subscore is 4.  GCS verbal subscore is 5. GCS motor subscore is 6.  Patient with decreased generalized strength in RUE and RLE. Decreased fine motor in RUE. Sensation intact.  Speech fluent  Skin: Skin is warm and dry.  Psychiatric: She has a normal mood and affect. Her speech is normal and behavior is normal. Judgment and thought content normal. Cognition and memory are normal.     Assessment/Plan Patient with brain imaging concerning for metastatic disease. MRI brain with and without contrast, CT scan of chest, and CT scan of abdomen ordered for further work-up. Decadron 4 mg ordered. Patient updated on plan of care.  Patricia Nettle, NP 04/02/2019, 7:37 AM

## 2019-04-03 ENCOUNTER — Ambulatory Visit
Admit: 2019-04-03 | Discharge: 2019-04-03 | Disposition: A | Discharge status: Home / Self Care | Payer: BC Managed Care – PPO | Attending: Radiation Oncology | Admitting: Radiation Oncology

## 2019-04-03 ENCOUNTER — Inpatient Hospital Stay (HOSPITAL_COMMUNITY): Payer: BC Managed Care – PPO

## 2019-04-03 ENCOUNTER — Other Ambulatory Visit: Payer: Self-pay | Admitting: Neurosurgery

## 2019-04-03 ENCOUNTER — Other Ambulatory Visit: Payer: Self-pay | Admitting: Radiation Therapy

## 2019-04-03 ENCOUNTER — Encounter (HOSPITAL_COMMUNITY): Payer: Self-pay | Admitting: *Deleted

## 2019-04-03 DIAGNOSIS — I10 Essential (primary) hypertension: Secondary | ICD-10-CM

## 2019-04-03 DIAGNOSIS — R2689 Other abnormalities of gait and mobility: Secondary | ICD-10-CM

## 2019-04-03 DIAGNOSIS — C7931 Secondary malignant neoplasm of brain: Principal | ICD-10-CM

## 2019-04-03 DIAGNOSIS — Z79899 Other long term (current) drug therapy: Secondary | ICD-10-CM

## 2019-04-03 DIAGNOSIS — C7951 Secondary malignant neoplasm of bone: Secondary | ICD-10-CM | POA: Insufficient documentation

## 2019-04-03 DIAGNOSIS — G8191 Hemiplegia, unspecified affecting right dominant side: Secondary | ICD-10-CM

## 2019-04-03 DIAGNOSIS — F17210 Nicotine dependence, cigarettes, uncomplicated: Secondary | ICD-10-CM

## 2019-04-03 DIAGNOSIS — G9389 Other specified disorders of brain: Secondary | ICD-10-CM

## 2019-04-03 DIAGNOSIS — C3411 Malignant neoplasm of upper lobe, right bronchus or lung: Secondary | ICD-10-CM

## 2019-04-03 DIAGNOSIS — G936 Cerebral edema: Secondary | ICD-10-CM

## 2019-04-03 DIAGNOSIS — Z807 Family history of other malignant neoplasms of lymphoid, hematopoietic and related tissues: Secondary | ICD-10-CM

## 2019-04-03 MED ORDER — GADOBUTROL 1 MMOL/ML IV SOLN
6.0000 mL | Freq: Once | INTRAVENOUS | Status: AC | PRN
  Administered 2019-04-03: 6 mL via INTRAVENOUS

## 2019-04-03 NOTE — Progress Notes (Signed)
Studies reviewed.  MRI scan done early this morning reveals solitary focus of almost certainly metastatic disease in her left parasagittal posterior frontal region.  The tumor itself is reasonably small about 1 cm in diameter.  It extends to the surface.  There is marked vasogenic edema surrounding it.  There is no other areas of suspicious findings for metastatic disease elsewhere.  CT scan of her chest demonstrate a likely primary bronchogenic carcinoma in her right lung field.  There are multiple areas of lymphadenopathy.  There is also a metastatic focus within her left T5 transverse process and proximal rib which is likely causing her neuropathic pain into her left chest wall.  There is no obvious disease in her abdomen aside from some equivocal liver findings.  Overall patient still has significant right-sided weakness albeit somewhat improved with IV steroids.  Vital signs are stable otherwise.  She has no other complaints.  Patient with newly discovered stage IV cancer likely bronchogenic in origin.  At this point I think her best option is to move forward with stereotactically guided craniotomy and resection of her posterior frontal tumor both for symptom improvement and for tissue diagnosis.  Dr. Marin Olp on board for medical oncology.  We will consult radiation oncology for treatment of her chest wall lesion to improve with neuropathic pain and for adjunctive SRS treatment to her operative bed postoperatively.  Tentatively plan for surgery on Friday.

## 2019-04-03 NOTE — Consult Note (Signed)
Radiation Oncology         [phone] ________________________________  Initial inpatient Consultation - Conducted via telephone due to current COVID-19 concerns for limiting patient exposure  Name: [name] MRN: [id number]  Date of Service: 04/01/2019 DOB: 02/28/1956  CC:Judy Nigh, MD  Earnie Larsson, MD  Burney Gauze, MD  REFERRING PHYSICIAN: Dr. Earnie [name]  DIAGNOSIS: 63 y.o. female with widely metastatic disease involving the lungs, brain and bones, felt most likely secondary to primary small cell lung cancer but tissue diagnosis is pending.    ICD-10-CM   1. Brain mass  G93.89   2. Right arm weakness  R29.898   3. Right leg weakness  R29.898     HISTORY OF PRESENT ILLNESS: [name] is a 63 y.o. female seen at the request of Dr. Annette [name].  She recently presented to the ED at [facility] due to progressive right sided weakness in the upper and lower extremities ongoing for the past week as well as severe left-sided chest wall pain.  A CT head on admission showed a large area of vasogenic edema in the left frontal lobe, highly suspicious for neoplasm, likely metastatic disease.  She was started on dexamethasone 4 mg p.o. every 6 hours to help manage the vasogenic edema and she reports noticing some improvement in the right sided weakness since starting this medication.  A CT C/A/P was performed on 04/02/19 for further evaluation and potential identification of a primary lesion and this did show a 3.6 cm spiculated mass in the medial right upper lobe, compatible with primary bronchogenic neoplasm.  Additionally, there were multiple bilateral pulmonary nodules, associated thoracic nodal metastases including a 10 mm left supraclavicular node which may be an amenable to biopsy/tissue sampling, and destructive lytic osseous metastasis along the anterior aspect of the L3 vertebral body, as well as lesions in the transverse process of T5 and a left proximal rib.  An MRI  of the brain with and without contrast was performed today and confirms a solitary 1.2 cm high left posterior frontal enhancing mass along the cortex with associated moderate vasogenic edema. Neurosurgery was consulted and Dr. Annette [name] met with the patient on 04/02/2019 to discuss treatment options and the current plan is to proceed with a stereotactic guided craniotomy and resection of the posterior frontal brain tumor for both therapeutic and diagnostic purposes.  This procedure is tentatively scheduled for Friday, 04/05/2019.  She has also met with Dr. Marin [name] who will be following her from a medical oncology standpoint and will make recommendations for systemic management once we have tissue confirmation.  We have been asked to consult the patient today for consideration of palliative radiotherapy to the left chest wall to help with her pain management as well as discussion regarding adjuvant radiotherapy to the brain postoperatively.  PREVIOUS RADIATION THERAPY: No  PAST MEDICAL HISTORY:  Past Medical History:  Diagnosis Date   Hypertension       PAST SURGICAL HISTORY:History reviewed. No pertinent surgical history.  FAMILY HISTORY:  Family History  Problem Relation Age of Onset   CAD Father        CABG   Cancer Brother        lymphoma    SOCIAL HISTORY:  Social History   Socioeconomic History   Marital status: Married    Spouse name: Not on file   Number of children: Not on file   Years of education: Not on file   Highest education level: Not on  file  Occupational History   Not on file  Social Needs   Financial resource strain: Not on file   Food insecurity    Worry: Not on file    Inability: Not on file   Transportation needs    Medical: Not on file    Non-medical: Not on file  Tobacco Use   Smoking status: Current Every Day Smoker    Packs/day: 0.50    Years: 25.00    Pack years: 12.50    Types: Cigarettes   Smokeless tobacco: Never Used  Substance  and Sexual Activity   Alcohol use: No    Alcohol/week: 0.0 standard drinks   Drug use: No   Sexual activity: Yes    Birth control/protection: Post-menopausal  Lifestyle   Physical activity    Days per week: Not on file    Minutes per session: Not on file   Stress: Not on file  Relationships   Social connections    Talks on phone: Not on file    Gets together: Not on file    Attends religious service: Not on file    Active member of club or organization: Not on file    Attends meetings of clubs or organizations: Not on file    Relationship status: Not on file   Intimate partner violence    Fear of current or ex partner: Not on file    Emotionally abused: Not on file    Physically abused: Not on file    Forced sexual activity: Not on file  Other Topics Concern   Not on file  Social History Narrative   Not on file    ALLERGIES: Codeine and Lisinopril-hydrochlorothiazide  MEDICATIONS:  Current Facility-Administered Medications  Medication Dose Route Frequency Provider Last Rate Last Dose   acetaminophen (TYLENOL) tablet 650 mg  650 mg Oral Q6H PRN Viona [name] D, NP       Or   acetaminophen (TYLENOL) suppository 650 mg  650 mg Rectal Q6H PRN Viona [name] D, NP       amLODipine (NORVASC) tablet 10 mg  10 mg Oral Daily Bergman, Meghan D, NP   10 mg at 04/03/19 0916   aspirin EC tablet 81 mg  81 mg Oral Daily Bergman, Meghan D, NP   81 mg at 04/03/19 0916   dexamethasone (DECADRON) injection 4 mg  4 mg Intravenous Q6H Bergman, Meghan D, NP   4 mg at 04/03/19 1718   gabapentin (NEURONTIN) capsule 300 mg  300 mg Oral TID PRN Viona [name] D, NP   300 mg at 04/02/19 1523   hydrALAZINE (APRESOLINE) tablet 25 mg  25 mg Oral BID Viona [name] D, NP   25 mg at 04/03/19 0916   levETIRAcetam (KEPPRA) tablet 500 mg  500 mg Oral BID Viona [name] D, NP   500 mg at 04/03/19 0916   ondansetron (ZOFRAN) injection 4 mg  4 mg Intravenous Q8H PRN Bergman, Meghan D,  NP       Or   ondansetron (ZOFRAN) tablet 4 mg  4 mg Oral Q8H PRN Bergman, Meghan D, NP       oxyCODONE-acetaminophen (PERCOCET/ROXICET) 5-325 MG per tablet 1-2 tablet  1-2 tablet Oral Q6H PRN Viona [name] D, NP   2 tablet at 04/03/19 1726   polyethylene glycol (MIRALAX / GLYCOLAX) packet 17 g  17 g Oral Daily PRN Viona [name] D, NP       traMADol (ULTRAM) tablet 50 mg  50 mg Oral Q6H PRN Bergman,  Meghan D, NP   50 mg at 04/02/19 1524    REVIEW OF SYSTEMS:  On review of systems, the patient reports that she is doing well overall and has noted some slight improvement in the right sided weakness since the time of admission and starting oral steroids.  Prior to admission, she reports noticing a change in her handwriting resembling that of a child as well as difficulty with fine motor skill in the right hand.  She also felt like she was dragging her right foot when walking.  She continues with significant left chest wall pain which requires narcotic pain medication for management.  The pain does improve with medication but only temporarily.  She denies shortness of breath, cough, fevers, chills, night sweats, or unintended weight changes.  She denies any bowel or bladder disturbances, and denies abdominal pain, nausea or vomiting.  She reports having pain in the left shoulder and chest wall for several months now and progressively worsening.  She denies any recent injuries or trauma and has not noted any numbness or tingling in the right upper or lower extremities.  She denies recent headaches, visual changes, dysphasia, difficulty with word finding, tinnitus or seizure activity.  She denies back pain or other sites of focal musculoskeletal pain.  A complete review of systems is obtained and is otherwise negative.  PHYSICAL EXAM:  Wt Readings from Last 3 Encounters:  04/01/19 134 lb (60.8 kg)  09/05/18 140 lb (63.5 kg)  06/13/16 137 lb (62.1 kg)   Temp Readings from Last 3 Encounters:    04/03/19 (!) 97.5 F (36.4 C) (Oral)  01/07/13 98 F (36.7 C) (Oral)  05/19/12 98.1 F (36.7 C) (Oral)   BP Readings from Last 3 Encounters:  04/03/19 120/73  09/05/18 (!) 150/84  06/13/16 (!) 146/80   Pulse Readings from Last 3 Encounters:  04/03/19 67  06/13/16 80  06/05/15 67   Pain Assessment Pain Score: 6 /10 Unable to assess due to telephone consult visit format.  KPS = 70  100 - Normal; no complaints; no evidence of disease. 90   - Able to carry on normal activity; minor signs or symptoms of disease. 80   - Normal activity with effort; some signs or symptoms of disease. 51   - Cares for self; unable to carry on normal activity or to do active work. 60   - Requires occasional assistance, but is able to care for most of his personal needs. 50   - Requires considerable assistance and frequent medical care. 41   - Disabled; requires special care and assistance. 58   - Severely disabled; hospital admission is indicated although death not imminent. 29   - Very sick; hospital admission necessary; active supportive treatment necessary. 10   - Moribund; fatal processes progressing rapidly. 0     - Dead  Karnofsky DA, Abelmann [place], Craver LS and Burchenal [facility] [phone]) The use of the nitrogen mustards in the palliative treatment of carcinoma: with particular reference to bronchogenic carcinoma Cancer 1 [id number]  LABORATORY DATA:  Lab Results  Component Value Date   WBC 9.3 04/01/2019   HGB 13.6 04/01/2019   HCT 41.0 04/01/2019   MCV 92.8 04/01/2019   PLT 407 (H) 04/01/2019   Lab Results  Component Value Date   NA 141 04/01/2019   K 3.8 04/01/2019   CL 107 04/01/2019   CO2 23 04/01/2019   Lab Results  Component Value Date   ALT 12 04/01/2019   AST 15  04/01/2019   ALKPHOS 108 04/01/2019   BILITOT 0.3 04/01/2019     RADIOGRAPHY: Ct Head Wo Contrast  Result Date: 04/01/2019 CLINICAL DATA:  Onset decreased movement in the right upper and lower extremities 1 week ago.  Altered handwriting. EXAM: CT HEAD WITHOUT CONTRAST TECHNIQUE: Contiguous axial images were obtained from the base of the skull through the vertex without intravenous contrast. COMPARISON:  None. FINDINGS: Brain: There is a large area vasogenic edema in the high left frontal lobe. Hypoattenuation is seen in the basal ganglia bilaterally. No hemorrhage, midline shift or evidence of acute infarct is identified. Vascular: Atherosclerosis noted.  No hyperdense vessel. Skull: Intact.  No focal lesion. Sinuses/Orbits: Negative. Other: None. IMPRESSION: Large area vasogenic edema in the high left frontal lobe highly suspicious for neoplasm, likely metastatic disease. Brain MRI with and without contrast is recommended for further evaluation. Chest x-ray is also recommended. Hypoattenuation in the basal ganglia bilaterally is likely due to prior lacunar infarctions. These results were called by telephone at the time of interpretation on 04/01/2019 at 3:52 pm to provider [facility] , who verbally acknowledged these results. Electronically Signed   By: Inge Rise M.D.   On: 04/01/2019 15:56   Ct Chest W Contrast  Result Date: 04/02/2019 CLINICAL DATA:  Cancer screening EXAM: CT CHEST, ABDOMEN, AND PELVIS WITH CONTRAST TECHNIQUE: Multidetector CT imaging of the chest, abdomen and pelvis was performed following the standard protocol during bolus administration of intravenous contrast. CONTRAST:  172m OMNIPAQUE IOHEXOL 300 MG/ML  SOLN COMPARISON:  Chest radiograph dated 04/01/2019 FINDINGS: CT CHEST FINDINGS Cardiovascular: The heart is normal in size. No pericardial effusion. No evidence of thoracic aortic aneurysm. Atherosclerotic calcifications of the aortic arch. Three vessel coronary atherosclerosis. Mediastinum/Nodes: Thoracic lymphadenopathy, including: --10 mm short axis left supraclavicular node (series 3/image 12) --3.3 cm short axis high right paratracheal node (series 3/image 18) --2.1 cm short axis  anterior mediastinal node (series 3/image 20) --2.9 cm short axis low right paratracheal node (series 3/image 23) --1.7 cm short axis mid parasymphyseal node (series 3/image 32) Visualized thyroid is unremarkable. Lungs/Pleura: 3.0 x 2.4 x 3.6 cm spiculated mass in the medial right upper lobe (series 5/image 38), compatible with primary bronchogenic neoplasm. Additional bilateral pulmonary metastases in the left upper lobe and bilateral lower lobes, approximately 20-25 in number, including: --17 mm left upper lobe nodule (series 5/image 69) --15 mm right upper lobe nodule (series 5/image 75) --16 mm medial left lower lobe nodule (series 5/image 102) Mild centrilobular and paraseptal emphysematous changes, upper lung predominant. No focal consolidation. No pleural effusion or pneumothorax. Musculoskeletal: Subtle sclerosis involving the superior endplate of TQ30and TS92(sagittal images 82 and 86), nonspecific. CT ABDOMEN PELVIS FINDINGS Hepatobiliary: 8 mm cyst in segment 8 (series 3/image 51). Additional 7 mm hypoenhancing lesion in segment 8 (series 3/image 84), indeterminate. 15 mm lesion at the junction of segment 5/6 (series 3/image 89), with suspected peripheral nodular discontinuous enhancement, suggesting a benign hemangioma. Technically, this remains incompletely characterized. Gallbladder is unremarkable. No intrahepatic or extrahepatic ductal dilatation. Pancreas: Within normal limits. Spleen: Within normal limits. Adrenals/Urinary Tract: Mild thickening of the left adrenal gland, without discrete mass. Right adrenal gland is within normal limits. 8 mm medial left upper pole renal cyst (series 8/image 7). Right kidney is within normal limits. No hydronephrosis. Bladder is within normal limits. Stomach/Bowel: Stomach is within normal limits. No evidence of bowel obstruction. Appendix is not discretely visualized. Vascular/Lymphatic: No evidence of abdominal aortic aneurysm. Atherosclerotic calcifications of  the abdominal aorta and branch vessels. No suspicious abdominopelvic lymphadenopathy. Reproductive: Uterine fibroids. Bilateral ovaries are within normal limits. Other: No abdominopelvic ascites. Musculoskeletal: Destructive lytic metastasis along the anterior aspect of the L3 vertebral body (sagittal image 77). IMPRESSION: 3.6 cm spiculated mass in the medial right upper lobe, compatible with primary bronchogenic neoplasm. Bilateral pulmonary metastases. Associated thoracic nodal metastases, including a 10 mm short axis left supraclavicular nodal metastasis which may be amenable to tissue sampling/resection. Destructive lytic osseous metastasis along the anterior aspect of the L3 vertebral body. Electronically Signed   By: Julian Hy M.D.   On: 04/02/2019 12:18   Mr Jeri Cos WU Contrast  Result Date: 04/03/2019 CLINICAL DATA:  Metastatic neoplasm suspected. Right-sided hemiparesis EXAM: MRI HEAD WITHOUT AND WITH CONTRAST TECHNIQUE: Multiplanar, multiecho pulse sequences of the brain and surrounding structures were obtained without and with intravenous contrast. CONTRAST:  69m GADAVIST GADOBUTROL 1 MMOL/ML IV SOLN COMPARISON:  Head CT from 2 days ago FINDINGS: Brain: 1.2 Cm high left posterior frontal enhancing mass along the cortex with moderate vasogenic edema. Chronic small vessel ischemia throughout the cerebral white matter with remote lacunar infarcts in the bilateral basal ganglia and brainstem. Scattered chronic blood products could be related to small vessel disease but are fairly peripheral. No acute infarct, acute hemorrhage, hydrocephalus, or collection. Vascular: Normal flow voids and vascular enhancements Skull and upper cervical spine: Normal marrow signal. Sinuses/Orbits: Negative IMPRESSION: 1. Solitary presumed metastasis in the posterior left frontal lobe with moderate vasogenic edema. 2. Extensive chronic small vessel ischemia. Electronically Signed   By: JMonte FantasiaM.D.   On:  04/03/2019 06:48   Ct Abdomen Pelvis W Contrast  Result Date: 04/02/2019 CLINICAL DATA:  Cancer screening EXAM: CT CHEST, ABDOMEN, AND PELVIS WITH CONTRAST TECHNIQUE: Multidetector CT imaging of the chest, abdomen and pelvis was performed following the standard protocol during bolus administration of intravenous contrast. CONTRAST:  1058mOMNIPAQUE IOHEXOL 300 MG/ML  SOLN COMPARISON:  Chest radiograph dated 04/01/2019 FINDINGS: CT CHEST FINDINGS Cardiovascular: The heart is normal in size. No pericardial effusion. No evidence of thoracic aortic aneurysm. Atherosclerotic calcifications of the aortic arch. Three vessel coronary atherosclerosis. Mediastinum/Nodes: Thoracic lymphadenopathy, including: --10 mm short axis left supraclavicular node (series 3/image 12) --3.3 cm short axis high right paratracheal node (series 3/image 18) --2.1 cm short axis anterior mediastinal node (series 3/image 20) --2.9 cm short axis low right paratracheal node (series 3/image 23) --1.7 cm short axis mid parasymphyseal node (series 3/image 32) Visualized thyroid is unremarkable. Lungs/Pleura: 3.0 x 2.4 x 3.6 cm spiculated mass in the medial right upper lobe (series 5/image 38), compatible with primary bronchogenic neoplasm. Additional bilateral pulmonary metastases in the left upper lobe and bilateral lower lobes, approximately 20-25 in number, including: --17 mm left upper lobe nodule (series 5/image 69) --15 mm right upper lobe nodule (series 5/image 75) --16 mm medial left lower lobe nodule (series 5/image 102) Mild centrilobular and paraseptal emphysematous changes, upper lung predominant. No focal consolidation. No pleural effusion or pneumothorax. Musculoskeletal: Subtle sclerosis involving the superior endplate of T1J81nd T1X91sagittal images 82 and 86), nonspecific. CT ABDOMEN PELVIS FINDINGS Hepatobiliary: 8 mm cyst in segment 8 (series 3/image 51). Additional 7 mm hypoenhancing lesion in segment 8 (series 3/image 84),  indeterminate. 15 mm lesion at the junction of segment 5/6 (series 3/image 89), with suspected peripheral nodular discontinuous enhancement, suggesting a benign hemangioma. Technically, this remains incompletely characterized. Gallbladder is unremarkable. No intrahepatic or extrahepatic ductal dilatation. Pancreas: Within normal  limits. Spleen: Within normal limits. Adrenals/Urinary Tract: Mild thickening of the left adrenal gland, without discrete mass. Right adrenal gland is within normal limits. 8 mm medial left upper pole renal cyst (series 8/image 7). Right kidney is within normal limits. No hydronephrosis. Bladder is within normal limits. Stomach/Bowel: Stomach is within normal limits. No evidence of bowel obstruction. Appendix is not discretely visualized. Vascular/Lymphatic: No evidence of abdominal aortic aneurysm. Atherosclerotic calcifications of the abdominal aorta and branch vessels. No suspicious abdominopelvic lymphadenopathy. Reproductive: Uterine fibroids. Bilateral ovaries are within normal limits. Other: No abdominopelvic ascites. Musculoskeletal: Destructive lytic metastasis along the anterior aspect of the L3 vertebral body (sagittal image 77). IMPRESSION: 3.6 cm spiculated mass in the medial right upper lobe, compatible with primary bronchogenic neoplasm. Bilateral pulmonary metastases. Associated thoracic nodal metastases, including a 10 mm short axis left supraclavicular nodal metastasis which may be amenable to tissue sampling/resection. Destructive lytic osseous metastasis along the anterior aspect of the L3 vertebral body. Electronically Signed   By: Julian Hy M.D.   On: 04/02/2019 12:18   Dg Chest Port 1 View  Result Date: 04/01/2019 CLINICAL DATA:  Decreased right upper extremity and lower extremity movement EXAM: PORTABLE CHEST 1 VIEW COMPARISON:  2014 FINDINGS: Chronic interstitial prominence. Patchy perihilar densities. No pleural effusion or pneumothorax. Normal heart  size. Increased right paratracheal density. IMPRESSION: Mild patchy perihilar atelectasis/consolidation. Increased right paratracheal density, which could reflect mediastinal soft tissue. Electronically Signed   By: Macy Mis M.D.   On: 04/01/2019 16:32      IMPRESSION/PLAN:  This visit was conducted via telephone to spare the patient unnecessary potential exposure in the healthcare setting during the current COVID-19 pandemic.   11. 63 y.o. female with widely metastatic disease involving the lungs, brain and bones, felt most likely secondary to primary small cell lung cancer but tissue diagnosis is pending. Her case has been discussed with and imaging personally reviewed by Dr. Tammi Klippel. Today, I talked to the patient about the findings and workup thus far. We discussed the natural history of metastatic carcinoma and general treatment, highlighting the role of radiotherapy in the management. We discussed the need for tissue diagnosis to help inform more formal treatment recommendations and we briefly reviewed the available radiation techniques.  If biopsy proves small cell carcinoma, our recommendation would be to proceed with WBRT approximately 2-3 weeks postoperative to allow for healing.  If this is proven to be a NSCLC, we would likely offer post-operative SRS in a single fraction, approximately 2 weeks postoperative.  We would also offer a 2 week course of palliative radiotherapy to the painful sites of metastases in the upper left chest wall.rib and transverse process of T5.  The patient was encouraged to ask questions that were answered to her satisfaction.  She understands that we plan to reconnect with her following review of final pathology and will confirm at that time our more formal treatment recommendations.  She has requested that I call and share this information with her husband, [name] which I am of course, more than happy to do.  She suggested that I call him in the morning on his cell  phone [phone]) which is my plan.   In a visit lasting 40 minutes, greater than 50% of that time was spent in telephone conversation, discussing her case and coordinating her care.   Nicholos Johns, PA-C    Tyler Pita, MD  [facility] Direct Dial: [phone]   Fax: [phone] [email or web address]  Skype   LinkedIn

## 2019-04-04 ENCOUNTER — Inpatient Hospital Stay (HOSPITAL_COMMUNITY): Payer: BC Managed Care – PPO

## 2019-04-04 DIAGNOSIS — G936 Cerebral edema: Secondary | ICD-10-CM | POA: Diagnosis not present

## 2019-04-04 DIAGNOSIS — C3490 Malignant neoplasm of unspecified part of unspecified bronchus or lung: Secondary | ICD-10-CM

## 2019-04-04 DIAGNOSIS — R531 Weakness: Secondary | ICD-10-CM

## 2019-04-04 DIAGNOSIS — K5900 Constipation, unspecified: Secondary | ICD-10-CM | POA: Diagnosis not present

## 2019-04-04 DIAGNOSIS — C7931 Secondary malignant neoplasm of brain: Secondary | ICD-10-CM | POA: Diagnosis not present

## 2019-04-04 LAB — MRSA PCR SCREENING: MRSA by PCR: NEGATIVE

## 2019-04-04 LAB — LACTATE DEHYDROGENASE: LDH: 152 U/L (ref 98–192)

## 2019-04-04 MED ORDER — LORAZEPAM 2 MG/ML IJ SOLN
1.0000 mg | Freq: Once | INTRAMUSCULAR | Status: AC
  Administered 2019-04-04: 1 mg via INTRAVENOUS

## 2019-04-04 MED ORDER — LACTULOSE 10 GM/15ML PO SOLN
20.0000 g | Freq: Three times a day (TID) | ORAL | Status: DC
  Administered 2019-04-04 (×2): 20 g via ORAL
  Filled 2019-04-04 (×3): qty 30

## 2019-04-04 MED ORDER — LORAZEPAM 2 MG/ML IJ SOLN
INTRAMUSCULAR | Status: AC
  Filled 2019-04-04: qty 1

## 2019-04-04 MED ORDER — GADOBUTROL 1 MMOL/ML IV SOLN
6.5000 mL | Freq: Once | INTRAVENOUS | Status: AC | PRN
  Administered 2019-04-04: 6.5 mL via INTRAVENOUS

## 2019-04-04 MED ORDER — GADOBUTROL 1 MMOL/ML IV SOLN: 6.5000 mL | Freq: Once | INTRAVENOUS | Status: DC | PRN

## 2019-04-04 NOTE — Progress Notes (Signed)
Looks like [name] is going to have surgery to resect the solitary brain met from the left parietal frontal region tomorrow.  I greatly appreciate Dr. Marchelle Folks assistance with this.  As always, I am sure that his technical expertise will easily be able to resect out this tumor.  By the MRI, it certainly is not a large tumor.  There is a lot of swelling associated with this.  I really do think that removing it will help with her symptoms since we are dealing with quality of life issues.  She has not had a bowel movement since admission.  I will have her on lactulose so that she will go.  She has had no problems with nausea or vomiting.  She still has weakness with the right side.  Her LDH is minimally elevated.  This sent off a neuron-specific enolase study which could help [place] if this is small cell lung cancer.  I think she was seen by radiation oncology.  I am sure they will be on board for radiation therapy post resection of the brain met.  I would make sure we get some lab studies on her tomorrow.  As far systemic therapy is concerned, we really have to await the pathology report.  If this is small cell lung cancer, the treatment options are relatively straightforward.  If this is a nonsmall cell lung cancer, we will have to see if this is squamous cell carcinoma or adenocarcinoma and then send off molecular studies to see if there are any actionable mutations.  She is in good spirits.  She is worried about her parents.  She wants to go home today for a couple hours just to talk to her parents.  I am unsure of she will be allowed to go home today.  I do appreciate everybody's help with Ms. Shimon.  She is in great shape.  Hopefully, she will do well with systemic therapy.  I know she will recover quickly from surgery and may be even go home this weekend.  Lattie Haw, MD  Oswaldo Milian 9:6

## 2019-04-04 NOTE — Progress Notes (Signed)
Overall stable.  No headache.  She states that her right lower extremity is a little bit more easy to control but she still having difficulty with the right hand.  She is having no speech or language problems.  She is ambulating with assistance reasonably well.  Patient is awake and alert.  She is oriented and appropriate.  Her speech is fluent.  Judgment insight are intact.  She has mild right-sided hemiparesis with pronator drift on the right side and some clumsiness with fine motor movement in the right hand.  She has some mild weakness of her right lower extremity grading out of 4+/5.  She is hyperreflexic on the right side.  Patient with a small focus of metastatic disease in her left posterior frontal parasagittal region.  Plan stereotactic craniotomy and resection tomorrow.  Risks and benefits of been explained.  Patient wishes to proceed.

## 2019-04-05 ENCOUNTER — Encounter (HOSPITAL_COMMUNITY): Payer: Self-pay | Admitting: Neurosurgery

## 2019-04-05 ENCOUNTER — Encounter (HOSPITAL_COMMUNITY)
Admission: EM | Disposition: A | Discharge status: Rehabilitation Facility | Payer: Self-pay | Source: Home / Self Care | Attending: Neurosurgery

## 2019-04-05 ENCOUNTER — Other Ambulatory Visit: Payer: Self-pay

## 2019-04-05 ENCOUNTER — Inpatient Hospital Stay (HOSPITAL_COMMUNITY): Payer: BC Managed Care – PPO

## 2019-04-05 DIAGNOSIS — Z85118 Personal history of other malignant neoplasm of bronchus and lung: Secondary | ICD-10-CM | POA: Diagnosis not present

## 2019-04-05 DIAGNOSIS — Z9889 Other specified postprocedural states: Secondary | ICD-10-CM

## 2019-04-05 DIAGNOSIS — E7800 Pure hypercholesterolemia, unspecified: Secondary | ICD-10-CM | POA: Diagnosis not present

## 2019-04-05 DIAGNOSIS — C7931 Secondary malignant neoplasm of brain: Secondary | ICD-10-CM | POA: Diagnosis not present

## 2019-04-05 DIAGNOSIS — D496 Neoplasm of unspecified behavior of brain: Secondary | ICD-10-CM | POA: Diagnosis not present

## 2019-04-05 DIAGNOSIS — C3490 Malignant neoplasm of unspecified part of unspecified bronchus or lung: Secondary | ICD-10-CM | POA: Diagnosis not present

## 2019-04-05 DIAGNOSIS — G8191 Hemiplegia, unspecified affecting right dominant side: Secondary | ICD-10-CM | POA: Diagnosis not present

## 2019-04-05 DIAGNOSIS — C719 Malignant neoplasm of brain, unspecified: Secondary | ICD-10-CM | POA: Diagnosis not present

## 2019-04-05 DIAGNOSIS — I10 Essential (primary) hypertension: Secondary | ICD-10-CM | POA: Diagnosis not present

## 2019-04-05 HISTORY — PX: CRANIOTOMY: SHX93

## 2019-04-05 HISTORY — PX: APPLICATION OF CRANIAL NAVIGATION: SHX6578

## 2019-04-05 LAB — BASIC METABOLIC PANEL
Anion gap: 11 (ref 5–15)
BUN: 13 mg/dL (ref 8–23)
CO2: 25 mmol/L (ref 22–32)
Calcium: 9.4 mg/dL (ref 8.9–10.3)
Chloride: 106 mmol/L (ref 98–111)
Creatinine, Ser: 0.76 mg/dL (ref 0.44–1.00)
GFR calc Af Amer: >60 mL/min (ref >60)
GFR calc non Af Amer: >60 mL/min (ref >60)
Glucose, Bld: 104 mg/dL — ABNORMAL HIGH (ref 70–99)
Potassium: 4.4 mmol/L (ref 3.5–5.1)
Sodium: 142 mmol/L (ref 135–145)

## 2019-04-05 LAB — CBC WITH DIFFERENTIAL/PLATELET
Abs Immature Granulocytes: 0.11 10*3/uL — ABNORMAL HIGH (ref 0.00–0.07)
Abs Immature Granulocytes: 0.05 10*3/uL (ref 0.00–0.07)
Basophils Absolute: 0 10*3/uL (ref 0.0–0.1)
Basophils Absolute: 0 10*3/uL (ref 0.0–0.1)
Basophils Relative: 0 %
Basophils Relative: 0 %
Eosinophils Absolute: 0 10*3/uL (ref 0.0–0.5)
Eosinophils Absolute: 0 10*3/uL (ref 0.0–0.5)
Eosinophils Relative: 0 %
Eosinophils Relative: 0 %
HCT: 41.6 % (ref 36.0–46.0)
HCT: 43.1 % (ref 36.0–46.0)
Hemoglobin: 13.9 g/dL (ref 12.0–15.0)
Hemoglobin: 14.3 g/dL (ref 12.0–15.0)
Immature Granulocytes: 1 %
Immature Granulocytes: 1 %
Lymphocytes Relative: 11 %
Lymphocytes Relative: 11 %
Lymphs Abs: 1.5 10*3/uL (ref 0.7–4.0)
Lymphs Abs: 1.2 10*3/uL (ref 0.7–4.0)
MCH: 30.6 pg (ref 26.0–34.0)
MCH: 30.7 pg (ref 26.0–34.0)
MCHC: 33.4 g/dL (ref 30.0–36.0)
MCHC: 33.2 g/dL (ref 30.0–36.0)
MCV: 91.6 fL (ref 80.0–100.0)
MCV: 92.5 fL (ref 80.0–100.0)
Monocytes Absolute: 0.8 10*3/uL (ref 0.1–1.0)
Monocytes Absolute: 0.4 10*3/uL (ref 0.1–1.0)
Monocytes Relative: 6 %
Monocytes Relative: 4 %
Neutro Abs: 11.3 10*3/uL — ABNORMAL HIGH (ref 1.7–7.7)
Neutro Abs: 9.2 10*3/uL — ABNORMAL HIGH (ref 1.7–7.7)
Neutrophils Relative %: 82 %
Neutrophils Relative %: 84 %
Platelets: 458 10*3/uL — ABNORMAL HIGH (ref 150–400)
Platelets: 443 10*3/uL — ABNORMAL HIGH (ref 150–400)
RBC: 4.54 MIL/uL (ref 3.87–5.11)
RBC: 4.66 MIL/uL (ref 3.87–5.11)
RDW: 12.2 % (ref 11.5–15.5)
RDW: 12.3 % (ref 11.5–15.5)
WBC: 13.8 10*3/uL — ABNORMAL HIGH (ref 4.0–10.5)
WBC: 10.8 10*3/uL — ABNORMAL HIGH (ref 4.0–10.5)
nRBC: 0 % (ref 0.0–0.2)
nRBC: 0 % (ref 0.0–0.2)

## 2019-04-05 LAB — COMPREHENSIVE METABOLIC PANEL
ALT: 12 U/L (ref 0–44)
AST: 11 U/L — ABNORMAL LOW (ref 15–41)
Albumin: 2.9 g/dL — ABNORMAL LOW (ref 3.5–5.0)
Alkaline Phosphatase: 90 U/L (ref 38–126)
Anion gap: 8 (ref 5–15)
BUN: 13 mg/dL (ref 8–23)
CO2: 25 mmol/L (ref 22–32)
Calcium: 9.2 mg/dL (ref 8.9–10.3)
Chloride: 106 mmol/L (ref 98–111)
Creatinine, Ser: 0.63 mg/dL (ref 0.44–1.00)
GFR calc Af Amer: >60 mL/min (ref >60)
GFR calc non Af Amer: >60 mL/min (ref >60)
Glucose, Bld: 110 mg/dL — ABNORMAL HIGH (ref 70–99)
Potassium: 3.8 mmol/L (ref 3.5–5.1)
Sodium: 139 mmol/L (ref 135–145)
Total Bilirubin: 0.3 mg/dL (ref 0.3–1.2)
Total Protein: 6.1 g/dL — ABNORMAL LOW (ref 6.5–8.1)

## 2019-04-05 LAB — TYPE AND SCREEN
ABO/RH(D): B NEG
Antibody Screen: NEGATIVE

## 2019-04-05 LAB — LACTATE DEHYDROGENASE: LDH: 166 U/L (ref 98–192)

## 2019-04-05 LAB — ABO/RH: ABO/RH(D): B NEG

## 2019-04-05 SURGERY — CRANIOTOMY TUMOR EXCISION
Anesthesia: General | Laterality: Left

## 2019-04-05 MED ORDER — LIDOCAINE-EPINEPHRINE 1 %-1:100000 IJ SOLN
INTRAMUSCULAR | Status: DC | PRN
  Administered 2019-04-05: 10 mL

## 2019-04-05 MED ORDER — PROPOFOL 10 MG/ML IV BOLUS
INTRAVENOUS | Status: DC | PRN
  Administered 2019-04-05: 200 mg via INTRAVENOUS
  Administered 2019-04-05: 50 mg via INTRAVENOUS

## 2019-04-05 MED ORDER — FENTANYL CITRATE (PF) 250 MCG/5ML IJ SOLN
INTRAMUSCULAR | Status: AC
  Filled 2019-04-05: qty 5

## 2019-04-05 MED ORDER — LEVETIRACETAM IN NACL 500 MG/100ML IV SOLN
500.0000 mg | Freq: Two times a day (BID) | INTRAVENOUS | Status: DC
  Administered 2019-04-05 – 2019-04-06 (×2): 500 mg via INTRAVENOUS
  Filled 2019-04-05 (×2): qty 100

## 2019-04-05 MED ORDER — PANTOPRAZOLE SODIUM 40 MG IV SOLR
40.0000 mg | Freq: Every day | INTRAVENOUS | Status: DC
  Administered 2019-04-05: 40 mg via INTRAVENOUS
  Filled 2019-04-05: qty 40

## 2019-04-05 MED ORDER — MIDAZOLAM HCL 2 MG/2ML IJ SOLN
INTRAMUSCULAR | Status: AC
  Filled 2019-04-05: qty 2

## 2019-04-05 MED ORDER — HYDROCODONE-ACETAMINOPHEN 5-325 MG PO TABS
1.0000 | ORAL_TABLET | ORAL | Status: DC | PRN
  Administered 2019-04-05 – 2019-04-13 (×22): 1 via ORAL
  Filled 2019-04-05 (×23): qty 1

## 2019-04-05 MED ORDER — SODIUM CHLORIDE 0.9 % IV SOLN
INTRAVENOUS | Status: DC
  Administered 2019-04-05: 11:00:00 via INTRAVENOUS

## 2019-04-05 MED ORDER — ONDANSETRON HCL 4 MG/2ML IJ SOLN
4.0000 mg | INTRAMUSCULAR | Status: DC | PRN
  Administered 2019-04-06: 4 mg via INTRAVENOUS
  Filled 2019-04-05: qty 2

## 2019-04-05 MED ORDER — ACETAMINOPHEN 325 MG PO TABS
650.0000 mg | ORAL_TABLET | ORAL | Status: DC | PRN
  Administered 2019-04-06 – 2019-04-07 (×3): 650 mg via ORAL
  Filled 2019-04-05 (×3): qty 2

## 2019-04-05 MED ORDER — THROMBIN 20000 UNITS EX SOLR
CUTANEOUS | Status: AC
  Filled 2019-04-05: qty 20000

## 2019-04-05 MED ORDER — PROPOFOL 10 MG/ML IV BOLUS
INTRAVENOUS | Status: AC
  Filled 2019-04-05: qty 20

## 2019-04-05 MED ORDER — MIDAZOLAM HCL 2 MG/2ML IJ SOLN
INTRAMUSCULAR | Status: DC | PRN
  Administered 2019-04-05: 2 mg via INTRAVENOUS

## 2019-04-05 MED ORDER — DEXAMETHASONE 4 MG PO TABS
4.0000 mg | ORAL_TABLET | Freq: Four times a day (QID) | ORAL | Status: AC
  Administered 2019-04-06 – 2019-04-07 (×4): 4 mg via ORAL
  Filled 2019-04-05 (×5): qty 1

## 2019-04-05 MED ORDER — CEFAZOLIN SODIUM-DEXTROSE 2-4 GM/100ML-% IV SOLN
2.0000 g | INTRAVENOUS | Status: AC
  Administered 2019-04-05: 2 g via INTRAVENOUS
  Filled 2019-04-05 (×2): qty 100

## 2019-04-05 MED ORDER — SODIUM CHLORIDE 0.9 % IV SOLN
INTRAVENOUS | Status: DC
  Administered 2019-04-05: 16:00:00 via INTRAVENOUS

## 2019-04-05 MED ORDER — PHENYLEPHRINE HCL-NACL 10-0.9 MG/250ML-% IV SOLN
INTRAVENOUS | Status: DC | PRN
  Administered 2019-04-05: 25 ug/min via INTRAVENOUS

## 2019-04-05 MED ORDER — SODIUM CHLORIDE 0.9 % IV SOLN
INTRAVENOUS | Status: DC | PRN
  Administered 2019-04-05: 1000 mg via INTRAVENOUS

## 2019-04-05 MED ORDER — DEXAMETHASONE 4 MG PO TABS
4.0000 mg | ORAL_TABLET | Freq: Three times a day (TID) | ORAL | Status: DC
  Administered 2019-04-07 – 2019-04-10 (×8): 4 mg via ORAL
  Filled 2019-04-05 (×9): qty 1

## 2019-04-05 MED ORDER — SENNOSIDES-DOCUSATE SODIUM 8.6-50 MG PO TABS: 1.0000 | ORAL_TABLET | Freq: Every evening | ORAL | Status: DC | PRN

## 2019-04-05 MED ORDER — BACITRACIN ZINC 500 UNIT/GM EX OINT
TOPICAL_OINTMENT | CUTANEOUS | Status: DC | PRN
  Administered 2019-04-05 (×2): 1 via TOPICAL

## 2019-04-05 MED ORDER — ROCURONIUM BROMIDE 10 MG/ML (PF) SYRINGE
PREFILLED_SYRINGE | INTRAVENOUS | Status: DC | PRN
  Administered 2019-04-05: 60 mg via INTRAVENOUS
  Administered 2019-04-05: 40 mg via INTRAVENOUS

## 2019-04-05 MED ORDER — CHLORHEXIDINE GLUCONATE CLOTH 2 % EX PADS
6.0000 | MEDICATED_PAD | Freq: Every day | CUTANEOUS | Status: DC
  Administered 2019-04-05: 6 via TOPICAL

## 2019-04-05 MED ORDER — ACETAMINOPHEN 650 MG RE SUPP: 650.0000 mg | RECTAL | Status: DC | PRN

## 2019-04-05 MED ORDER — OXYCODONE-ACETAMINOPHEN 5-325 MG PO TABS
ORAL_TABLET | ORAL | Status: AC
  Filled 2019-04-05: qty 1

## 2019-04-05 MED ORDER — SODIUM CHLORIDE 0.9 % IV SOLN
INTRAVENOUS | Status: DC | PRN
  Administered 2019-04-05: 500 mL

## 2019-04-05 MED ORDER — LIDOCAINE 2% (20 MG/ML) 5 ML SYRINGE
INTRAMUSCULAR | Status: DC | PRN
  Administered 2019-04-05: 60 mg via INTRAVENOUS

## 2019-04-05 MED ORDER — CHLORHEXIDINE GLUCONATE CLOTH 2 % EX PADS
6.0000 | MEDICATED_PAD | Freq: Once | CUTANEOUS | Status: AC
  Administered 2019-04-05: 6 via TOPICAL

## 2019-04-05 MED ORDER — 0.9 % SODIUM CHLORIDE (POUR BTL) OPTIME
TOPICAL | Status: DC | PRN
  Administered 2019-04-05: 3000 mL

## 2019-04-05 MED ORDER — DEXAMETHASONE SODIUM PHOSPHATE 10 MG/ML IJ SOLN
10.0000 mg | Freq: Once | INTRAMUSCULAR | Status: AC
  Administered 2019-04-05: 10 mg via INTRAVENOUS
  Filled 2019-04-05: qty 1

## 2019-04-05 MED ORDER — CHLORHEXIDINE GLUCONATE CLOTH 2 % EX PADS: 6.0000 | MEDICATED_PAD | Freq: Once | CUTANEOUS | Status: DC

## 2019-04-05 MED ORDER — FENTANYL CITRATE (PF) 250 MCG/5ML IJ SOLN
INTRAMUSCULAR | Status: DC | PRN
  Administered 2019-04-05: 150 ug via INTRAVENOUS
  Administered 2019-04-05 (×2): 50 ug via INTRAVENOUS

## 2019-04-05 MED ORDER — METOPROLOL TARTRATE 5 MG/5ML IV SOLN
INTRAVENOUS | Status: AC
  Filled 2019-04-05: qty 5

## 2019-04-05 MED ORDER — CEFAZOLIN SODIUM-DEXTROSE 2-4 GM/100ML-% IV SOLN
2.0000 g | Freq: Three times a day (TID) | INTRAVENOUS | Status: AC
  Administered 2019-04-05 – 2019-04-06 (×2): 2 g via INTRAVENOUS
  Filled 2019-04-05 (×2): qty 100

## 2019-04-05 MED ORDER — ONDANSETRON HCL 4 MG PO TABS: 4.0000 mg | ORAL_TABLET | ORAL | Status: DC | PRN

## 2019-04-05 MED ORDER — LABETALOL HCL 5 MG/ML IV SOLN
10.0000 mg | INTRAVENOUS | Status: DC | PRN
  Administered 2019-04-05 – 2019-04-06 (×2): 20 mg via INTRAVENOUS
  Filled 2019-04-05: qty 4

## 2019-04-05 MED ORDER — BACITRACIN ZINC 500 UNIT/GM EX OINT
TOPICAL_OINTMENT | CUTANEOUS | Status: AC
  Filled 2019-04-05: qty 28.35

## 2019-04-05 MED ORDER — DEXAMETHASONE 6 MG PO TABS
6.0000 mg | ORAL_TABLET | Freq: Four times a day (QID) | ORAL | Status: AC
  Administered 2019-04-05 – 2019-04-06 (×4): 6 mg via ORAL
  Filled 2019-04-05 (×4): qty 1

## 2019-04-05 MED ORDER — SUGAMMADEX SODIUM 200 MG/2ML IV SOLN
INTRAVENOUS | Status: DC | PRN
  Administered 2019-04-05: 200 mg via INTRAVENOUS

## 2019-04-05 MED ORDER — PROMETHAZINE HCL 25 MG PO TABS: 12.5000 mg | ORAL_TABLET | ORAL | Status: DC | PRN

## 2019-04-05 MED ORDER — EPHEDRINE SULFATE-NACL 50-0.9 MG/10ML-% IV SOSY
PREFILLED_SYRINGE | INTRAVENOUS | Status: DC | PRN
  Administered 2019-04-05 (×2): 5 mg via INTRAVENOUS

## 2019-04-05 MED ORDER — THROMBIN 20000 UNITS EX SOLR
CUTANEOUS | Status: DC | PRN
  Administered 2019-04-05: 20 mL via TOPICAL

## 2019-04-05 MED ORDER — NALOXONE HCL 0.4 MG/ML IJ SOLN: 0.0800 mg | INTRAMUSCULAR | Status: DC | PRN

## 2019-04-05 MED ORDER — DEXAMETHASONE SODIUM PHOSPHATE 10 MG/ML IJ SOLN
INTRAMUSCULAR | Status: DC | PRN
  Administered 2019-04-05: 10 mg via INTRAVENOUS

## 2019-04-05 MED ORDER — LIDOCAINE-EPINEPHRINE 1 %-1:100000 IJ SOLN
INTRAMUSCULAR | Status: AC
  Filled 2019-04-05: qty 1

## 2019-04-05 MED ORDER — HYDROMORPHONE HCL 1 MG/ML IJ SOLN
0.5000 mg | INTRAMUSCULAR | Status: DC | PRN
  Administered 2019-04-05 – 2019-04-12 (×17): 1 mg via INTRAVENOUS
  Filled 2019-04-05 (×17): qty 1

## 2019-04-05 SURGICAL SUPPLY — 63 items
BAG DECANTER FOR FLEXI CONT (MISCELLANEOUS) ×3 IMPLANT
BLADE CLIPPER SURG (BLADE) IMPLANT
BNDG COHESIVE 4X5 TAN STRL (GAUZE/BANDAGES/DRESSINGS) IMPLANT
BUR ACORN 6.0 PRECISION (BURR) ×2 IMPLANT
BUR ACORN 6.0MM PRECISION (BURR) ×1
BUR SPIRAL ROUTER 2.3 (BUR) IMPLANT
BUR SPIRAL ROUTER 2.3MM (BUR)
CANISTER SUCT 3000ML PPV (MISCELLANEOUS) ×6 IMPLANT
CARTRIDGE OIL MAESTRO DRILL (MISCELLANEOUS) ×1 IMPLANT
CLIP VESOCCLUDE MED 6/CT (CLIP) IMPLANT
CONT SPEC 4OZ CLIKSEAL STRL BL (MISCELLANEOUS) ×6 IMPLANT
COVER WAND RF STERILE (DRAPES) IMPLANT
DIFFUSER DRILL AIR PNEUMATIC (MISCELLANEOUS) ×3 IMPLANT
DRAPE CAMERA VIDEO/LASER (DRAPES) IMPLANT
DRAPE MICROSCOPE LEICA (MISCELLANEOUS) IMPLANT
DRAPE NEUROLOGICAL W/INCISE (DRAPES) ×3 IMPLANT
DRAPE STERI IOBAN 125X83 (DRAPES) IMPLANT
DRAPE SURG 17X23 STRL (DRAPES) IMPLANT
DRAPE WARM FLUID 44X44 (DRAPES) ×3 IMPLANT
ELECT CAUTERY BLADE 6.4 (BLADE) ×3 IMPLANT
ELECT REM PT RETURN 9FT ADLT (ELECTROSURGICAL) ×3
ELECTRODE REM PT RTRN 9FT ADLT (ELECTROSURGICAL) ×1 IMPLANT
GAUZE 4X4 16PLY RFD (DISPOSABLE) IMPLANT
GAUZE SPONGE 4X4 12PLY STRL (GAUZE/BANDAGES/DRESSINGS) ×3 IMPLANT
GLOVE ECLIPSE 9.0 STRL (GLOVE) ×3 IMPLANT
GLOVE EXAM NITRILE XL STR (GLOVE) IMPLANT
GOWN STRL REUS W/ TWL LRG LVL3 (GOWN DISPOSABLE) IMPLANT
GOWN STRL REUS W/ TWL XL LVL3 (GOWN DISPOSABLE) IMPLANT
GOWN STRL REUS W/TWL 2XL LVL3 (GOWN DISPOSABLE) IMPLANT
GOWN STRL REUS W/TWL LRG LVL3 (GOWN DISPOSABLE)
GOWN STRL REUS W/TWL XL LVL3 (GOWN DISPOSABLE)
HEMOSTAT SURGICEL 2X14 (HEMOSTASIS) IMPLANT
KIT BASIN OR (CUSTOM PROCEDURE TRAY) ×3 IMPLANT
KIT TURNOVER KIT B (KITS) ×3 IMPLANT
MARKER SPHERE PSV REFLC 13MM (MARKER) ×6 IMPLANT
NEEDLE HYPO 18GX1.5 BLUNT FILL (NEEDLE) IMPLANT
NEEDLE HYPO 25X1 1.5 SAFETY (NEEDLE) ×3 IMPLANT
NS IRRIG 1000ML POUR BTL (IV SOLUTION) ×9 IMPLANT
OIL CARTRIDGE MAESTRO DRILL (MISCELLANEOUS) ×3
PACK CRANIOTOMY CUSTOM (CUSTOM PROCEDURE TRAY) ×3 IMPLANT
PAD ARMBOARD 7.5X6 YLW CONV (MISCELLANEOUS) ×9 IMPLANT
PATTIES SURGICAL .25X.25 (GAUZE/BANDAGES/DRESSINGS) IMPLANT
PATTIES SURGICAL .5 X.5 (GAUZE/BANDAGES/DRESSINGS) IMPLANT
PATTIES SURGICAL .5 X3 (DISPOSABLE) IMPLANT
PATTIES SURGICAL 1X1 (DISPOSABLE) IMPLANT
PLATE 1.5/0.5 25MM BURR HOLE (Plate) ×3 IMPLANT
RUBBERBAND STERILE (MISCELLANEOUS) IMPLANT
SCREW SELF DRILL HT 1.5/4MM (Screw) ×9 IMPLANT
SPONGE NEURO XRAY DETECT 1X3 (DISPOSABLE) IMPLANT
SPONGE SURGIFOAM ABS GEL 100 (HEMOSTASIS) ×3 IMPLANT
STAPLER VISISTAT 35W (STAPLE) ×3 IMPLANT
STOCKINETTE 6  STRL (DRAPES) ×2
STOCKINETTE 6 STRL (DRAPES) ×1 IMPLANT
SUT NURALON 4 0 TR CR/8 (SUTURE) ×3 IMPLANT
SUT VIC AB 2-0 CT2 18 VCP726D (SUTURE) ×6 IMPLANT
SYR CONTROL 10ML LL (SYRINGE) ×3 IMPLANT
TOWEL GREEN STERILE (TOWEL DISPOSABLE) ×3 IMPLANT
TOWEL GREEN STERILE FF (TOWEL DISPOSABLE) ×3 IMPLANT
TRAY FOLEY MTR SLVR 16FR STAT (SET/KITS/TRAYS/PACK) ×3 IMPLANT
TUBE CONNECTING 20'X1/4 (TUBING) ×1
TUBE CONNECTING 20X1/4 (TUBING) ×2 IMPLANT
UNDERPAD 30X30 (UNDERPADS AND DIAPERS) ×3 IMPLANT
WATER STERILE IRR 1000ML POUR (IV SOLUTION) ×3 IMPLANT

## 2019-04-05 NOTE — Brief Op Note (Signed)
04/05/2019  12:49 PM  PATIENT:  [name]  63 y.o. female  PRE-OPERATIVE DIAGNOSIS:  Tumor  POST-OPERATIVE DIAGNOSIS:  Tumor  PROCEDURE:  Procedure(s) with comments: LEFT FRONTAL CRANIOTOMY TUMOR EXCISION w/Brain Lab (Left) - LEFT FRONTAL CRANIOTOMY TUMOR EXCISION w/Brain Lab APPLICATION OF CRANIAL NAVIGATION (Left)  SURGEON:  Surgeon(s) and Role:    Earnie Larsson, MD - Primary  PHYSICIAN ASSISTANT:   ASSISTANTS: bergman,NP   ANESTHESIA:   general  EBL:  50 mL   BLOOD ADMINISTERED:none  DRAINS: none   LOCAL MEDICATIONS USED:  LIDOCAINE   SPECIMEN:  Source of Specimen:  left frontal lobe  DISPOSITION OF SPECIMEN:  PATHOLOGY  COUNTS:  YES  TOURNIQUET:  * No tourniquets in log *  DICTATION: .Dragon Dictation  PLAN OF CARE: Admit to inpatient   PATIENT DISPOSITION:  PACU - hemodynamically stable.   Delay start of Pharmacological VTE agent (>24hrs) due to surgical blood loss or risk of bleeding: yes

## 2019-04-05 NOTE — Transfer of Care (Signed)
Immediate Anesthesia Transfer of Care Note  Patient: [name]  Procedure(s) Performed: LEFT FRONTAL CRANIOTOMY TUMOR EXCISION w/Brain Lab (Left ) APPLICATION OF CRANIAL NAVIGATION (Left )  Patient Location: PACU  Anesthesia Type:General  Level of Consciousness: awake, alert  and oriented  Airway & Oxygen Therapy: Patient Spontanous Breathing and Patient connected to face mask oxygen  Post-op Assessment: Report given to RN and Post -op Vital signs reviewed and stable  Post vital signs: Reviewed and stable  Last Vitals:  Vitals Value Taken Time  BP 178/87 04/05/19 1258  Temp    Pulse 59 04/05/19 1258  Resp 14 04/05/19 1258  SpO2 99 % 04/05/19 1258  Vitals shown include unvalidated device data.  Last Pain:  Vitals:   04/05/19 0844  TempSrc: Oral  PainSc:       Patients Stated Pain Goal: 0 (68/37/29 0211)  Complications: No apparent anesthesia complications

## 2019-04-05 NOTE — Progress Notes (Signed)
[name] had her surgery today.  As expected, Dr. Annette Stable did a fantastic job.  Sounds like he got the tumor out.  She does have some residual weakness on the right side.  I think that this should get better as postop edema resolves.  Otherwise, she is in good spirits.  She does not complain of any pain outside of that with the left shoulder.  She has had no cough or shortness of breath.  She has had no nausea or vomiting.  Her labs today look good.  Her white cell count was 10.8.  Hemoglobin 14.3.  Platelet count 443,000.  Her BUN is 13 creatinine 0.76.  Calcium is 9.4.  We will await the results of the excisional biopsy.  I would have to believe that this is going to be a nonsmall cell lung cancer.  However, it might be small cell lung cancer.  She clearly is going to need adjuvant radiation therapy to the brain.  We will have to get  Radiation Oncology involved.  Once we know  the pathology report.  Her vital signs all look good.  Temperature 97.5.  Pulse 57.  Blood pressure 139/72.  Her lungs sound relatively clear bilaterally.  I do not hear any wheezing.  Cardiac exam regular rate and rhythm.  Abdomen is soft.  Bowel sounds are present.  Neurological exam does show the weakness on the right arm and right leg.  She has a lot of faith.  We did have a very good prayer session.  She wanted me to pray with her.  I was very happy to do this.  Again I appreciate everybody's help with Ms. Dave.  Hopefully, she will be able to go home soon.  Lattie Haw, MD  Oswaldo Milian 9:6

## 2019-04-05 NOTE — Progress Notes (Signed)
Chaplain went to complete Advanced Directive, but there are no witnesses or volunteers at this time.  Chaplain was able to speak with [name] about the notary and witness issue and she stated that it was ok and that she we could complete it at a later time.  Unit chaplain will follow-up.

## 2019-04-05 NOTE — Anesthesia Preprocedure Evaluation (Addendum)
Anesthesia Evaluation  Patient identified by MRN, date of birth, ID band Patient awake    Reviewed: Allergy & Precautions, NPO status , Patient's Chart, lab work & pertinent test results  Airway Mallampati: II  TM Distance: >3 FB Neck ROM: Full    Dental  (+) Teeth Intact, Dental Advisory Given   Pulmonary Current Smoker,    breath sounds clear to auscultation       Cardiovascular hypertension, Pt. on medications  Rhythm:Regular Rate:Normal     Neuro/Psych PSYCHIATRIC DISORDERS    GI/Hepatic negative GI ROS, Neg liver ROS,   Endo/Other  negative endocrine ROS  Renal/GU negative Renal ROS     Musculoskeletal negative musculoskeletal ROS (+)   Abdominal Normal abdominal exam  (+)   Peds  Hematology negative hematology ROS (+)   Anesthesia Other Findings   Reproductive/Obstetrics negative OB ROS                            Anesthesia Physical Anesthesia Plan  ASA: II  Anesthesia Plan: General   Post-op Pain Management:    Induction: Intravenous  PONV Risk Score and Plan: 3 and Dexamethasone, Ondansetron and Midazolam  Airway Management Planned: Oral ETT  Additional Equipment: Arterial line  Intra-op Plan:   Post-operative Plan: Extubation in OR  Informed Consent: I have reviewed the patients History and Physical, chart, labs and discussed the procedure including the risks, benefits and alternatives for the proposed anesthesia with the patient or authorized representative who has indicated his/her understanding and acceptance.     Dental advisory given  Plan Discussed with: CRNA  Anesthesia Plan Comments:        Anesthesia Quick Evaluation

## 2019-04-05 NOTE — Anesthesia Procedure Notes (Signed)
Procedure Name: Intubation Date/Time: 04/05/2019 12:08 PM Performed by: Teressa Lower., CRNA Pre-anesthesia Checklist: Patient identified, Emergency Drugs available, Suction available and Patient being monitored Patient Re-evaluated:Patient Re-evaluated prior to induction Oxygen Delivery Method: Circle system utilized Preoxygenation: Pre-oxygenation with 100% oxygen Induction Type: IV induction Ventilation: Mask ventilation without difficulty Laryngoscope Size: Mac and 3 Grade View: Grade I Tube type: Oral Tube size: 7.0 mm Number of attempts: 1 Airway Equipment and Method: Stylet Placement Confirmation: ETT inserted through vocal cords under direct vision,  positive ETCO2 and breath sounds checked- equal and bilateral Secured at: 21 cm Tube secured with: Tape Dental Injury: Teeth and Oropharynx as per pre-operative assessment

## 2019-04-05 NOTE — Op Note (Signed)
Date of procedure: 04/05/2019  Date of dictation: Same  Service: Neurosurgery  Preoperative diagnosis: Left posterior frontal metastatic brain tumor, likely non-small cell carcinoma of the lung  Postoperative diagnosis: Same  Procedure Name: Left frontal craniotomy with resection of tumor, intraoperative stereotactic guidance using the BrainLab system for volumetric resection.  Surgeon:Leobardo Granlund A.Alonzo Owczarzak, M.D.  Asst. Surgeon: Reinaldo Meeker, NP  Anesthesia: General  Indication: 63 year old female who presented earlier this week with progressive right upper and lower extremity weakness.  Work-up demonstrates evidence of a focus of tumor in her posterior medial frontal lobe along the surface parasagittally.  Tumor with remarkable surrounding edema.  Patient also with a likely primary tumor within her right lung field with multiple areas of lymphadenopathy and a secondary area of metastasis to her right T5 transverse process and rib.  Patient presents now for resection of her brain lesion for hopeful symptom improvement, tissue diagnosis and establishment of long-term treatment plan.  Patient is aware that she will require adjunctive treatment following this resection both for her brain tumor but most especially for her other disease.  Operative note: After induction of anesthesia, patient positioned supine with her neck slightly flexed and held in place with a [place] pin headrest.  Patient's scalp was prepped and draped sterilely.  The BrainLab stereotactic system was attached and registered and used in conjunction with prior MRI scanning done of the patient's brain done preoperatively.  Incision point overlying the tumor was selected.  Because the tumor was so close to midline I opted to try initially to only do a large bur hole overlying the tumor and try to spare her a craniotomy crossing midline and possibly injuring her superior sagittal sinus.  I made an incision overlying the planned craniectomy site.   Wound was then retracted and held in place with a self-retaining retractor.  The BrainLab system was once again used for guidance.  A large bur hole was then made using high-speed drill.  The bur hole was widened slightly using Kerrison rongeurs.  The stereotactic system confirmed good approach to the underlying tumor.  The dura was then elevated and incised in a cruciate fashion.  The underlying cortex of the brain was very edematous and abnormal appearing.  I made a cortical incision carried into the left frontal gyrus.  The tissue there was abnormal but not clearly consistent with tumor.  I explored the margins of the cortical incision and sent for frozen section.  In the course of waiting for the frozen section I widened the craniectomy site slightly and extended the dural opening anteriorly and medially.  I found a more promising area of cortical abnormality.  I extended the cortical incision and found purpleish friable tissue much more consistent with metastatic tumor.  I collected tumor for pathologic studies.  I performed a gross total resection of the tumor bed.  I left no visible tumor.  Hemostasis was excellent.  Wound was then irrigated with antibiotic solution.  Gelfoam was placed over the dural opening.  A bur hole cover was placed over the craniectomy site.  Wound is then closed in layers of Vicryl sutures.  Staples were applied to the surface.  No apparent complications.  Patient tolerated procedure well and she returns to the recovery room postop.

## 2019-04-05 NOTE — Progress Notes (Signed)
Postop check.  Hemodynamically stable.  No headache.  She is awake and alert.  She is oriented and appropriate.  She has significant worsening of her right upper and right lower extremity weakness.  She has only flicker movement of her right upper extremity.  She has 2/5 strength in her right lower extremity with some increased tone.  I believe patient is likely suffering symptoms secondary to SMA irritation postoperatively.  Plan for follow-up MRI scan in morning.  Patient reassured that her weakness should resolve over time.

## 2019-04-05 NOTE — Anesthesia Procedure Notes (Signed)
Arterial Line Insertion Start/End12/02/2019 10:30 AM, 04/05/2019 10:35 AM Performed by: Teressa Lower., CRNA, CRNA  Patient location: Pre-op. Preanesthetic checklist: patient identified, IV checked, site marked, risks and benefits discussed, surgical consent, monitors and equipment checked, pre-op evaluation, timeout performed and anesthesia consent Lidocaine 1% used for infiltration Right, radial was placed Catheter size: 20 G Hand hygiene performed , maximum sterile barriers used  and Seldinger technique used Allen's test indicative of satisfactory collateral circulation Attempts: 1 Procedure performed using ultrasound guided technique. Ultrasound Notes:anatomy identified, needle tip was noted to be adjacent to the nerve/plexus identified and no ultrasound evidence of intravascular and/or intraneural injection Following insertion, Biopatch and dressing applied. Post procedure assessment: normal and unchanged  Patient tolerated the procedure well with no immediate complications.

## 2019-04-06 ENCOUNTER — Inpatient Hospital Stay (HOSPITAL_COMMUNITY): Payer: BC Managed Care – PPO

## 2019-04-06 MED ORDER — LEVETIRACETAM 500 MG PO TABS
500.0000 mg | ORAL_TABLET | Freq: Two times a day (BID) | ORAL | Status: DC
  Administered 2019-04-06 – 2019-04-13 (×14): 500 mg via ORAL
  Filled 2019-04-06 (×14): qty 1

## 2019-04-06 MED ORDER — NICOTINE 7 MG/24HR TD PT24
7.0000 mg | MEDICATED_PATCH | Freq: Every day | TRANSDERMAL | Status: DC
  Filled 2019-04-06 (×9): qty 1

## 2019-04-06 MED ORDER — GADOBUTROL 1 MMOL/ML IV SOLN
6.0000 mL | Freq: Once | INTRAVENOUS | Status: AC | PRN
  Administered 2019-04-06: 6 mL via INTRAVENOUS

## 2019-04-06 MED ORDER — PANTOPRAZOLE SODIUM 40 MG PO TBEC
40.0000 mg | DELAYED_RELEASE_TABLET | Freq: Every day | ORAL | Status: DC
  Administered 2019-04-06 – 2019-04-12 (×7): 40 mg via ORAL
  Filled 2019-04-06 (×7): qty 1

## 2019-04-06 NOTE — Progress Notes (Addendum)
Neurosurgery Service Progress Note  Subjective: No acute events overnight   Objective: Vitals:   04/06/19 0800 04/06/19 0900 04/06/19 1000 04/06/19 1100  BP: (!) 149/76 (!) 153/78 (!) 159/131 (!) 142/83  Pulse: (!) 53 (!) 51 70 60  Resp:      Temp: 97.9 F (36.6 C)     TempSrc: Oral     SpO2: 91% 93% 91% 92%  Weight:      Height:       Temp (24hrs), Avg:97.8 F (36.6 C), Min:97 F (36.1 C), Max:98.5 F (36.9 C)  CBC Latest Ref Rng & Units 04/05/2019 04/05/2019 04/01/2019  WBC 4.0 - 10.5 K/uL 10.8(H) 13.8(H) 9.3  Hemoglobin 12.0 - 15.0 g/dL 14.3 13.9 13.6  Hematocrit 36.0 - 46.0 % 43.1 41.6 41.0  Platelets 150 - 400 K/uL 443(H) 458(H) 407(H)   BMP Latest Ref Rng & Units 04/05/2019 04/05/2019 04/01/2019  Glucose 70 - 99 mg/dL 104(H) 110(H) 118(H)  BUN 8 - 23 mg/dL 13 13 16   Creatinine 0.44 - 1.00 mg/dL 0.76 0.63 0.76  Sodium 135 - 145 mmol/L 142 139 141  Potassium 3.5 - 5.1 mmol/L 4.4 3.8 3.8  Chloride 98 - 111 mmol/L 106 106 107  CO2 22 - 32 mmol/L 25 25 23   Calcium 8.9 - 10.3 mg/dL 9.4 9.2 9.3    Intake/Output Summary (Last 24 hours) at 04/06/2019 1216 Last data filed at 04/06/2019 1000 Gross per 24 hour  Intake 2718.12 ml  Output 925 ml  Net 1793.12 ml    Current Facility-Administered Medications:  .  0.9 %  sodium chloride infusion, , Intravenous, Continuous, Pool, Mallie Mussel, MD, Last Rate: 75 mL/hr at 04/06/19 1000, Rate Verify at 04/06/19 1000 .  acetaminophen (TYLENOL) tablet 650 mg, 650 mg, Oral, Q4H PRN, 650 mg at 04/06/19 1206 **OR** acetaminophen (TYLENOL) suppository 650 mg, 650 mg, Rectal, Q4H PRN, Earnie Larsson, MD .  Chlorhexidine Gluconate Cloth 2 % PADS 6 each, 6 each, Topical, Daily, Earnie Larsson, MD, 6 each at 04/05/19 1600 .  [COMPLETED] dexamethasone (DECADRON) tablet 6 mg, 6 mg, Oral, Q6H, 6 mg at 04/06/19 1206 **FOLLOWED BY** dexamethasone (DECADRON) tablet 4 mg, 4 mg, Oral, Q6H **FOLLOWED BY** [START ON 04/07/2019] dexamethasone (DECADRON) tablet 4 mg,  4 mg, Oral, Q8H, Pool, Henry, MD .  gabapentin (NEURONTIN) capsule 300 mg, 300 mg, Oral, TID PRN, Earnie Larsson, MD, 300 mg at 04/04/19 1048 .  HYDROcodone-acetaminophen (NORCO/VICODIN) 5-325 MG per tablet 1 tablet, 1 tablet, Oral, Q4H PRN, Earnie Larsson, MD, 1 tablet at 04/06/19 1205 .  HYDROmorphone (DILAUDID) injection 0.5-1 mg, 0.5-1 mg, Intravenous, Q2H PRN, Earnie Larsson, MD, 1 mg at 04/06/19 0954 .  labetalol (NORMODYNE) injection 10-40 mg, 10-40 mg, Intravenous, Q10 min PRN, Earnie Larsson, MD, 20 mg at 04/06/19 1030 .  levETIRAcetam (KEPPRA) IVPB 500 mg/100 mL premix, 500 mg, Intravenous, Q12H, Pool, Mallie Mussel, MD, Stopped at 04/06/19 0807 .  naloxone [facility]) injection 0.08 mg, 0.08 mg, Intravenous, PRN, Earnie Larsson, MD .  ondansetron (ZOFRAN) tablet 4 mg, 4 mg, Oral, Q4H PRN **OR** ondansetron (ZOFRAN) injection 4 mg, 4 mg, Intravenous, Q4H PRN, Earnie Larsson, MD, 4 mg at 04/06/19 1032 .  pantoprazole (PROTONIX) injection 40 mg, 40 mg, Intravenous, QHS, Pool, Mallie Mussel, MD, 40 mg at 04/05/19 2053 .  promethazine (PHENERGAN) tablet 12.5-25 mg, 12.5-25 mg, Oral, Q4H PRN, Earnie Larsson, MD .  senna-docusate (Senokot-S) tablet 1 tablet, 1 tablet, Oral, QHS PRN, Earnie Larsson, MD   Physical Exam: AOx3, speech fluent w/ normal content, PERRL,  EOMI, FS, Strength 5/5 on L, 1/5 in RUE, 3/5 in RLE, SILT on L, diffuse R sided numbness Incision c/d/i w/ minimal staining on dressing  Assessment & Plan: 63 y.o. woman s/p craniotomy for resection of likely metastatic tumor, post-op likely SMA syndrome.  -transfer to floor bed -d/c foley, d/c A-line -activity as tolerated -cont dex taper -on PPx keppra 500bid, will switch to po -PT/OT, likely be a good CIR candidate -SCDs/TEDs, hold SQH  Pt & husband updated at bedside. They asked about treatment of her primary / other metastatic foci. I said that we most likely would hold off on other Tx for a week or two while she recovers, then Dr. Marin Olp would likely reassess  KPS/etc to determine next steps / Tx plan.   [name]  04/06/19 12:16 PM

## 2019-04-06 NOTE — Plan of Care (Signed)
  Problem: Pain Managment: Goal: General experience of comfort will improve Outcome: Progressing   

## 2019-04-07 NOTE — Progress Notes (Signed)
2039: Paged on-call provider regarding pt's report of 10/10 pain in RT calf. Calf is swollen but no signs of redness or warmth. Received order for doppler. Pain med given. Will continue to monitor.

## 2019-04-07 NOTE — Progress Notes (Signed)
Neurosurgery Service Progress Note  Subjective: No acute events overnight   Objective: Vitals:   04/06/19 2322 04/07/19 0150 04/07/19 0341 04/07/19 0650  BP: (!) 183/87 (!) 175/98 (!) 182/85 (!) 170/90  Pulse: (!) 53 (!) 51  73  Resp: 13 10  (!) 21  Temp: 97.6 F (36.4 C)  97.6 F (36.4 C)   TempSrc: Oral  Oral   SpO2: 96% 95%    Weight:      Height:       Temp (24hrs), Avg:97.8 F (36.6 C), Min:97.5 F (36.4 C), Max:98.2 F (36.8 C)  CBC Latest Ref Rng & Units 04/05/2019 04/05/2019 04/01/2019  WBC 4.0 - 10.5 K/uL 10.8(H) 13.8(H) 9.3  Hemoglobin 12.0 - 15.0 g/dL 14.3 13.9 13.6  Hematocrit 36.0 - 46.0 % 43.1 41.6 41.0  Platelets 150 - 400 K/uL 443(H) 458(H) 407(H)   BMP Latest Ref Rng & Units 04/05/2019 04/05/2019 04/01/2019  Glucose 70 - 99 mg/dL 104(H) 110(H) 118(H)  BUN 8 - 23 mg/dL 13 13 16   Creatinine 0.44 - 1.00 mg/dL 0.76 0.63 0.76  Sodium 135 - 145 mmol/L 142 139 141  Potassium 3.5 - 5.1 mmol/L 4.4 3.8 3.8  Chloride 98 - 111 mmol/L 106 106 107  CO2 22 - 32 mmol/L 25 25 23   Calcium 8.9 - 10.3 mg/dL 9.4 9.2 9.3    Intake/Output Summary (Last 24 hours) at 04/07/2019 1116 Last data filed at 04/07/2019 1000 Gross per 24 hour  Intake 1435.76 ml  Output 900 ml  Net 535.76 ml    Current Facility-Administered Medications:  .  0.9 %  sodium chloride infusion, , Intravenous, Continuous, Earnie Larsson, MD, Stopped at 04/07/19 [phone] .  acetaminophen (TYLENOL) tablet 650 mg, 650 mg, Oral, Q4H PRN, 650 mg at 04/07/19 0943 **OR** acetaminophen (TYLENOL) suppository 650 mg, 650 mg, Rectal, Q4H PRN, Earnie Larsson, MD .  Margrett Rud dexamethasone (DECADRON) tablet 6 mg, 6 mg, Oral, Q6H, 6 mg at 04/06/19 1206 **FOLLOWED BY** dexamethasone (DECADRON) tablet 4 mg, 4 mg, Oral, Q6H, 4 mg at 04/07/19 0513 **FOLLOWED BY** dexamethasone (DECADRON) tablet 4 mg, 4 mg, Oral, Q8H, Pool, Henry, MD .  gabapentin (NEURONTIN) capsule 300 mg, 300 mg, Oral, TID PRN, Earnie Larsson, MD, 300 mg at  04/07/19 0008 .  HYDROcodone-acetaminophen (NORCO/VICODIN) 5-325 MG per tablet 1 tablet, 1 tablet, Oral, Q4H PRN, Earnie Larsson, MD, 1 tablet at 04/07/19 0943 .  HYDROmorphone (DILAUDID) injection 0.5-1 mg, 0.5-1 mg, Intravenous, Q2H PRN, Earnie Larsson, MD, 1 mg at 04/06/19 0954 .  labetalol (NORMODYNE) injection 10-40 mg, 10-40 mg, Intravenous, Q10 min PRN, Earnie Larsson, MD, 20 mg at 04/06/19 1030 .  levETIRAcetam (KEPPRA) tablet 500 mg, 500 mg, Oral, BID, Joshu Furukawa, Joyice Faster, MD, 500 mg at 04/07/19 0943 .  naloxone [facility]) injection 0.08 mg, 0.08 mg, Intravenous, PRN, Earnie Larsson, MD .  nicotine (NICODERM CQ - dosed in mg/24 hr) patch 7 mg, 7 mg, Transdermal, Daily, Kyliee Ortego A, MD .  ondansetron (ZOFRAN) tablet 4 mg, 4 mg, Oral, Q4H PRN **OR** ondansetron (ZOFRAN) injection 4 mg, 4 mg, Intravenous, Q4H PRN, Earnie Larsson, MD, 4 mg at 04/06/19 1032 .  pantoprazole (PROTONIX) EC tablet 40 mg, 40 mg, Oral, Q supper, Alvira Philips, , 40 mg at 04/06/19 1716 .  promethazine (PHENERGAN) tablet 12.5-25 mg, 12.5-25 mg, Oral, Q4H PRN, Earnie Larsson, MD .  senna-docusate (Senokot-S) tablet 1 tablet, 1 tablet, Oral, QHS PRN, Earnie Larsson, MD   Physical Exam: AOx3, speech fluent w/ normal content, PERRL,  EOMI, FS, Strength 5/5 on L, 1/5 in RUE, 3/5 in RLE, SILT on L, diffuse R sided numbness Incision c/d/i w/ minimal staining on dressing  Assessment & Plan: 63 y.o. woman s/p craniotomy for resection of likely metastatic tumor, post-op likely SMA syndrome.  -activity as tolerated -cont dex taper -on PPx keppra 500bid po  -PT/OT, likely be a good CIR candidate -okay to d/c dressing and shower / wash hair -SCDs/TEDs  Judith Part  04/07/19 11:16 AM

## 2019-04-08 ENCOUNTER — Encounter: Payer: Self-pay | Admitting: *Deleted

## 2019-04-08 ENCOUNTER — Inpatient Hospital Stay (HOSPITAL_COMMUNITY): Payer: BC Managed Care – PPO

## 2019-04-08 DIAGNOSIS — C3490 Malignant neoplasm of unspecified part of unspecified bronchus or lung: Secondary | ICD-10-CM | POA: Diagnosis not present

## 2019-04-08 DIAGNOSIS — E8809 Other disorders of plasma-protein metabolism, not elsewhere classified: Secondary | ICD-10-CM | POA: Diagnosis not present

## 2019-04-08 DIAGNOSIS — E46 Unspecified protein-calorie malnutrition: Secondary | ICD-10-CM | POA: Diagnosis not present

## 2019-04-08 DIAGNOSIS — R52 Pain, unspecified: Secondary | ICD-10-CM | POA: Diagnosis not present

## 2019-04-08 DIAGNOSIS — I82461 Acute embolism and thrombosis of right calf muscular vein: Secondary | ICD-10-CM | POA: Diagnosis not present

## 2019-04-08 DIAGNOSIS — G9389 Other specified disorders of brain: Secondary | ICD-10-CM | POA: Diagnosis not present

## 2019-04-08 DIAGNOSIS — G893 Neoplasm related pain (acute) (chronic): Secondary | ICD-10-CM | POA: Diagnosis not present

## 2019-04-08 DIAGNOSIS — D72828 Other elevated white blood cell count: Secondary | ICD-10-CM | POA: Diagnosis not present

## 2019-04-08 DIAGNOSIS — M7989 Other specified soft tissue disorders: Secondary | ICD-10-CM

## 2019-04-08 DIAGNOSIS — C3411 Malignant neoplasm of upper lobe, right bronchus or lung: Secondary | ICD-10-CM | POA: Diagnosis not present

## 2019-04-08 DIAGNOSIS — C7951 Secondary malignant neoplasm of bone: Secondary | ICD-10-CM | POA: Diagnosis not present

## 2019-04-08 DIAGNOSIS — R252 Cramp and spasm: Secondary | ICD-10-CM | POA: Diagnosis not present

## 2019-04-08 DIAGNOSIS — Z7901 Long term (current) use of anticoagulants: Secondary | ICD-10-CM | POA: Diagnosis not present

## 2019-04-08 DIAGNOSIS — C7931 Secondary malignant neoplasm of brain: Secondary | ICD-10-CM | POA: Diagnosis not present

## 2019-04-08 DIAGNOSIS — M25512 Pain in left shoulder: Secondary | ICD-10-CM | POA: Diagnosis not present

## 2019-04-08 DIAGNOSIS — D72823 Leukemoid reaction: Secondary | ICD-10-CM | POA: Diagnosis not present

## 2019-04-08 DIAGNOSIS — R531 Weakness: Secondary | ICD-10-CM | POA: Diagnosis not present

## 2019-04-08 DIAGNOSIS — R29898 Other symptoms and signs involving the musculoskeletal system: Secondary | ICD-10-CM | POA: Diagnosis not present

## 2019-04-08 DIAGNOSIS — G8918 Other acute postprocedural pain: Secondary | ICD-10-CM | POA: Diagnosis not present

## 2019-04-08 DIAGNOSIS — I82401 Acute embolism and thrombosis of unspecified deep veins of right lower extremity: Secondary | ICD-10-CM | POA: Diagnosis not present

## 2019-04-08 DIAGNOSIS — R609 Edema, unspecified: Secondary | ICD-10-CM | POA: Diagnosis not present

## 2019-04-08 MED ORDER — ENOXAPARIN SODIUM 60 MG/0.6ML ~~LOC~~ SOLN
1.0000 mg/kg | Freq: Two times a day (BID) | SUBCUTANEOUS | Status: DC
  Administered 2019-04-08 – 2019-04-13 (×11): 60 mg via SUBCUTANEOUS
  Filled 2019-04-08 (×11): qty 0.6

## 2019-04-08 NOTE — Evaluation (Signed)
Occupational Therapy Evaluation Patient Details Name: [name] MRN: [id number] DOB: [birth year] Today's Date: 04/08/2019    History of Present Illness 63 yo female admitted to ED on 12/7 with RLE and RUE weakness and altered sensation over the past 2-3 weeks. CT head revealed L frontal lobe edema with mass, s/p L frontal craniotomy with tumor resection on 12/11. Pt with suspected SMA irritation post-operatively with increased weakness RUE/LE. Per oncology, pt with suspected stage IV bronchogenic carcinoma with mets to bilateral lungs, L3 vertebral body, and L thoracic node. Hospital course complicatied by RLE DVT, now therapeutic to see. PMH includes HTN, tobacco use, hip stress fracture 1 year ago with recurrence 1 month ago.   Clinical Impression   PTA pt fully independent with increased weakness ~2-3 weeks prior to sx. At time of eval, pt is mod A +2 for bed mobility and functional transfers due to R sided weakness. Pt has trace contraction in RUE at deltoid, trap, and bicep but otherwise flaccid. R GH joint with 1 finger sublux, but approximates with weight bearing. While sitting EOB pt can don socks with mod A for support of RLE in figure 4. Pt able to take few small steps to chair with mod A +2. Vision is intact, cognition was functional for eval but will need further testing for higher level deficits. At this time recommend CIR. Will continue to follow per POC listed below.    Follow Up Recommendations  CIR;Supervision/Assistance - 24 hour    Equipment Recommendations  Other (comment)(TBD)    Recommendations for Other Services       Precautions / Restrictions Precautions Precautions: Fall Precaution Comments: craniotomy; pathologic fracture L3 Restrictions Weight Bearing Restrictions: No      Mobility Bed Mobility Overal bed mobility: Needs Assistance Bed Mobility: Supine to Sit     Supine to sit: Mod assist;HOB elevated;+2 for safety/equipment     General bed  mobility comments: mod assist for RLE and RUE management, light trunk elevation and steadying assist. Increased time and effort with use of bedrails to steady self once sitting EOB.  Transfers Overall transfer level: Needs assistance Equipment used: 2 person hand held assist Transfers: Sit to/from Stand Sit to Stand: Mod assist;From elevated surface;+2 physical assistance;+2 safety/equipment         General transfer comment: Mod assist +2 for power up, hip extension to neutral, and steadying. Pre-gait steps taken bilaterally with heavy RLE knee extension facilitation and guarding.    Balance Overall balance assessment: Needs assistance Sitting-balance support: Single extremity supported;Feet supported Sitting balance-Leahy Scale: Fair Sitting balance - Comments: able to sit EOB with use of LUE support, pt with posterior leaning with LE MMT testing Postural control: Posterior lean   Standing balance-Leahy Scale: Poor Standing balance comment: reliant on external support in standing                           ADL either performed or assessed with clinical judgement   ADL Overall ADL's : Needs assistance/impaired Eating/Feeding: Set up;Sitting   Grooming: Set up;Sitting   Upper Body Bathing: Moderate assistance;Sitting   Lower Body Bathing: Moderate assistance;Sit to/from stand;Sitting/lateral leans   Upper Body Dressing : Moderate assistance;Sitting   Lower Body Dressing: Moderate assistance;Sit to/from stand;Sitting/lateral leans Lower Body Dressing Details (indicate cue type and reason): mod A sitting EOB to don socks. Mod A to hold RLE in figure 4 and start sock donning process Toilet Transfer: Moderate assistance;Stand-pivot;BSC   Toileting-  Clothing Manipulation and Hygiene: Moderate assistance;Sit to/from stand       Functional mobility during ADLs: Moderate assistance;+2 for physical assistance;+2 for safety/equipment General ADL Comments: pt limited by R  sided weakness and decreased activity tolerance     Vision Baseline Vision/History: No visual deficits Patient Visual Report: No change from baseline Vision Assessment?: Yes Tracking/Visual Pursuits: Able to track stimulus in all quads without difficulty Convergence: Within functional limits Visual Fields: No apparent deficits     Perception     Praxis      Pertinent Vitals/Pain Pain Assessment: Faces Faces Pain Scale: Hurts even more Pain Location: RLE Pain Descriptors / Indicators: Sore;Discomfort Pain Intervention(s): Limited activity within patient's tolerance;Monitored during session;Patient requesting pain meds-RN notified     Hand Dominance Right   Extremity/Trunk Assessment Upper Extremity Assessment Upper Extremity Assessment: RUE deficits/detail;LUE deficits/detail RUE Deficits / Details: trace movement in trap, deltoid, and bicep. Decreased sensation throughout- needs more detailed testing LUE Deficits / Details: [facility]   Lower Extremity Assessment Lower Extremity Assessment: Overall WFL for tasks assessed;RLE deficits/detail RLE Deficits / Details: hip flexion 2-/5, knee extension 2+/5, knee flexion 1/5, DF/PF 0/5. Hypertonicity R PF RLE Sensation: WNL RLE Coordination: decreased gross motor;decreased fine motor   Cervical / Trunk Assessment Cervical / Trunk Assessment: Kyphotic   Communication Communication Communication: No difficulties   Cognition Arousal/Alertness: Awake/alert Behavior During Therapy: WFL for tasks assessed/performed Overall Cognitive Status: Within Functional Limits for tasks assessed                                 General Comments: Pt is emotional at times about current mobility/RUE and RLE function, but very motivated to return to PLOF   General Comments  VSS    Exercises     Shoulder Instructions      Home Living Family/patient expects to be discharged to:: Private residence Living Arrangements:  Spouse/significant other;Parent Available Help at Discharge: Family Type of Home: House Home Access: Ramped entrance     Home Layout: One level     Bathroom Shower/Tub: Hospital doctor Toilet: Handicapped height Bathroom Accessibility: Yes   Home Equipment: Environmental consultant - 2 wheels;Cane - single point          Prior Functioning/Environment Level of Independence: Independent        Comments: fully independent prior, weakness started ~2-3 weeks prior to sx        OT Problem List: Decreased strength;Decreased knowledge of use of DME or AE;Impaired tone;Decreased range of motion;Decreased coordination;Decreased activity tolerance;Impaired UE functional use;Impaired balance (sitting and/or standing);Impaired sensation;Pain      OT Treatment/Interventions: Self-care/ADL training;Therapeutic exercise;Patient/family education;Neuromuscular education;Balance training;Therapeutic activities;DME and/or AE instruction;Cognitive remediation/compensation    OT Goals(Current goals can be found in the care plan section) Acute Rehab OT Goals Patient Stated Goal: get back to my independence OT Goal Formulation: With patient Time For Goal Achievement: 04/22/19 Potential to Achieve Goals: Good  OT Frequency: Min 2X/week   Barriers to D/C:            Co-evaluation PT/OT/SLP Co-Evaluation/Treatment: Yes Reason for Co-Treatment: Complexity of the patient's impairments (multi-system involvement);For patient/therapist safety;To address functional/ADL transfers PT goals addressed during session: Mobility/safety with mobility;Balance OT goals addressed during session: ADL's and self-care;Strengthening/ROM      AM-PAC OT "6 Clicks" Daily Activity     Outcome Measure Help from another person eating meals?: A Little Help from another person taking care  of personal grooming?: A Little Help from another person toileting, which includes using toliet, bedpan, or urinal?: A Lot Help from  another person bathing (including washing, rinsing, drying)?: A Lot Help from another person to put on and taking off regular upper body clothing?: A Little Help from another person to put on and taking off regular lower body clothing?: A Lot 6 Click Score: 15   End of Session Equipment Utilized During Treatment: Gait belt Nurse Communication: Mobility status;Patient requests pain meds  Activity Tolerance: Patient tolerated treatment well Patient left: in chair;with call bell/phone within reach;with family/visitor present  OT Visit Diagnosis: Unsteadiness on feet (R26.81);Muscle weakness (generalized) (M62.81);Hemiplegia and hemiparesis;Other abnormalities of gait and mobility (R26.89) Hemiplegia - Right/Left: Right Hemiplegia - dominant/non-dominant: Dominant Hemiplegia - caused by: Other cerebrovascular disease                Time: 8889-1694 OT Time Calculation (min): 31 min Charges:  OT General Charges $OT Visit: 1 Visit OT Evaluation $OT Eval Moderate Complexity: 1 Mod  Zenovia Jarred, MSOT, OTR/L [place] OT/ Acute Relief OT [name] Office: [place] 04/08/2019, 6:10 PM

## 2019-04-08 NOTE — Evaluation (Signed)
Physical Therapy Evaluation Patient Details Name: [name] MRN: [id number] DOB: October 08, 1955 Today's Date: 04/08/2019   History of Present Illness  63 yo female admitted to ED on 12/7 with RLE and RUE weakness and altered sensation over the past 2-3 weeks. CT head revealed L frontal lobe edema with mass, s/p L frontal craniotomy with tumor resection on 12/11. Pt with suspected SMA irritation post-operatively with increased weakness RUE/LE. Per oncology, pt with suspected stage IV bronchogenic carcinoma with mets to bilateral lungs, L3 vertebral body, and L thoracic node. Hospital course complicatied by RLE DVT, now therapeutic to see. PMH includes HTN, tobacco use, hip stress fracture 1 year ago with recurrence 1 month ago.  Clinical Impression   Pt presents with significant R sided weakness RUE>RLE, altered sensation of RUE (see OT note), difficulty performing mobility tasks, impaired gait with significant RLE buckling in standing, and decreased activity tolerance due to pain and fatigue. Pt to benefit from acute PT to address deficits. Pt required mod assist +2 for bed mobility, transfer and taking ~4 strides to get to recliner. PT focused on weight shifting and progression of RLE with pt, with max RLE guarding during RLE stance phase of gait. Pt is extremely motivated to progress mobility, and is eager to return to independence as much as possible. Pt's supportive husband present during session, and states he takes care of pt's father who lives with them and will be able to provide 24/7 care to his wife as well. PT to progress mobility as tolerated, and will continue to follow acutely.      Follow Up Recommendations CIR    Equipment Recommendations  Other (comment)(defer to next venue)    Recommendations for Other Services Rehab consult     Precautions / Restrictions Precautions Precautions: Fall Precaution Comments: craniotomy; pathologic fracture L3 Restrictions Weight Bearing  Restrictions: No      Mobility  Bed Mobility Overal bed mobility: Needs Assistance Bed Mobility: Supine to Sit     Supine to sit: Mod assist;HOB elevated;+2 for safety/equipment     General bed mobility comments: mod assist for RLE and RUE management, light trunk elevation and steadying assist. Increased time and effort with use of bedrails to steady self once sitting EOB.  Transfers Overall transfer level: Needs assistance Equipment used: 2 person hand held assist Transfers: Sit to/from Stand Sit to Stand: Mod assist;From elevated surface;+2 physical assistance;+2 safety/equipment         General transfer comment: Mod assist +2 for power up, hip extension to neutral, and steadying. Pre-gait steps taken bilaterally with heavy RLE knee extension facilitation and guarding.  Ambulation/Gait Ambulation/Gait assistance: Mod assist;+2 safety/equipment;+2 physical assistance Gait Distance (Feet): 4 Feet Assistive device: 2 person hand held assist Gait Pattern/deviations: Step-to pattern;Decreased step length - right;Decreased weight shift to right;Drifts right/left;Narrow base of support Gait velocity: decr   General Gait Details: Mod assist +2 for trunk support, weight shifting L and R. Verbal cuing for weight shift L to progress RLE first with LLE vaulting noted to progress RLE, wait for PT RLE blocking then WB through RLE with progression of LLE. Steps x4 to get to recliner at bedside.  Stairs            Wheelchair Mobility    Modified Rankin (Stroke Patients Only)       Balance Overall balance assessment: Needs assistance Sitting-balance support: Single extremity supported;Feet supported Sitting balance-Leahy Scale: Fair Sitting balance - Comments: able to sit EOB with use of LUE support, pt  with posterior leaning with LE MMT testing Postural control: Posterior lean   Standing balance-Leahy Scale: Poor Standing balance comment: reliant on external support in  standing                             Pertinent Vitals/Pain Pain Assessment: Faces Faces Pain Scale: Hurts even more Pain Location: RLE Pain Descriptors / Indicators: Sore;Discomfort Pain Intervention(s): Limited activity within patient's tolerance;Monitored during session;Repositioned;Patient requesting pain meds-RN notified    Home Living Family/patient expects to be discharged to:: Private residence Living Arrangements: Spouse/significant other;Parent Available Help at Discharge: Family Type of Home: House Home Access: Ramped entrance     Home Layout: One level Home Equipment: Environmental consultant - 2 wheels;Cane - single point      Prior Function Level of Independence: Independent         Comments: fully independent prior, weakness started ~2-3 weeks prior to sx     Hand Dominance   Dominant Hand: Right    Extremity/Trunk Assessment   Upper Extremity Assessment Upper Extremity Assessment: Defer to OT evaluation    Lower Extremity Assessment Lower Extremity Assessment: Overall WFL for tasks assessed;RLE deficits/detail RLE Deficits / Details: hip flexion 2-/5, knee extension 2+/5, knee flexion 1/5, DF/PF 0/5. Hypertonicity R PF RLE Sensation: WNL RLE Coordination: decreased gross motor;decreased fine motor    Cervical / Trunk Assessment Cervical / Trunk Assessment: Kyphotic  Communication   Communication: No difficulties  Cognition Arousal/Alertness: Awake/alert Behavior During Therapy: WFL for tasks assessed/performed Overall Cognitive Status: Within Functional Limits for tasks assessed                                 General Comments: Pt is emotional at times about current mobility/RUE and RLE function, but very motivated to return to PLOF      General Comments General comments (skin integrity, edema, etc.): VSS    Exercises     Assessment/Plan    PT Assessment Patient needs continued PT services  PT Problem List Decreased  strength;Decreased mobility;Impaired tone;Decreased activity tolerance;Decreased balance;Decreased knowledge of use of DME;Pain;Impaired sensation;Decreased coordination       PT Treatment Interventions DME instruction;Therapeutic activities;Gait training;Therapeutic exercise;Patient/family education;Balance training;Functional mobility training    PT Goals (Current goals can be found in the Care Plan section)  Acute Rehab PT Goals Patient Stated Goal: get back to my independence PT Goal Formulation: With patient/family Time For Goal Achievement: 04/22/19 Potential to Achieve Goals: Good    Frequency Min 4X/week   Barriers to discharge        Co-evaluation PT/OT/SLP Co-Evaluation/Treatment: Yes Reason for Co-Treatment: Complexity of the patient's impairments (multi-system involvement);For patient/therapist safety;To address functional/ADL transfers PT goals addressed during session: Mobility/safety with mobility;Balance         AM-PAC PT "6 Clicks" Mobility  Outcome Measure Help needed turning from your back to your side while in a flat bed without using bedrails?: A Little Help needed moving from lying on your back to sitting on the side of a flat bed without using bedrails?: A Lot Help needed moving to and from a bed to a chair (including a wheelchair)?: A Lot Help needed standing up from a chair using your arms (e.g., wheelchair or bedside chair)?: A Lot Help needed to walk in hospital room?: A Lot Help needed climbing 3-5 steps with a railing? : Total 6 Click Score: 12  End of Session   Activity Tolerance: Patient limited by fatigue;Patient limited by pain Patient left: in chair;with chair alarm set;with call bell/phone within reach;with family/visitor present Nurse Communication: Mobility status;Patient requests pain meds PT Visit Diagnosis: Other symptoms and signs involving the nervous system (R29.898);Muscle weakness (generalized) (M62.81);Difficulty in walking,  not elsewhere classified (R26.2)    Time: 0962-8366 PT Time Calculation (min) (ACUTE ONLY): 32 min   Charges:   PT Evaluation $PT Eval Moderate Complexity: 1 Mod        Bobbi Kozakiewicz E, PT Acute Rehabilitation Services Pager [phone]  Office [phone]  Decker Cogdell D Elonda Husky 04/08/2019, 5:36 PM

## 2019-04-08 NOTE — Consult Note (Signed)
Physical Medicine and Rehabilitation Consult   Reason for Consult: Functional deficits due to metastatic cancer to the brain.  Referring Physician: Dr. Annette Stable   HPI: [name] is a 63 y.o. female with history of HTN, OA who  otherwise in good health who started developing right sided weakness with foot drag and difficulty lifting her arm as well as left sided chest pain under her left breast for about 2-3 weeks prior to showing up for evaluation in ED on 04/01/19. CT head done revealing large area of vasogenic edema in left frontal lobe concerning for metastatic disease. Neurology recommended MRI brain for work up and this revealed presumed solitary left frontal lobe mass with moderate vasogenic edema and extensive small vessel disease.  She was started on Keppra and IV decadron and transferred to [facility] for further management.   CT chest, abdomen and pelvis revealed 3.0 X2.4X 3.6 cm spiculated mass in medial right upper lobe compatible with primary bronchogenic neoplasm with additional bilateral pulmonary metastases in LUL and BLL, thoracic nodal metastases, left supraclavicular nodal metastasis, hepatic cyst and lytic osseous metastasis along anterior aspect of L3 vertebral body. Hem/onc--Dr. Marin Olp consulted for input and recommended stereotactic crani with resection of tumor for likely primary bronchogenic carcinoma RUL.  Radiation Onc consulted for palliiative Tx of left chest wall due to ongoing severe pain in back and left lower chest wall and plans are to start 2 week course treatment after pathology available. Dr. Annette Stable consulted and patient underwent left frontal crani with resection of tumor on 12/11.  Pathology positive for adenocarcinoma and neurosurgery recommends holding off on other treatment for a week. Treatment options to be discussed with tumor board 12/21.   She continues to have right sided weakness with numbness and steroids being weaned off. She developed severe right calf  pain on 12/13 pm and  BLE dopplers done yesterday revealing age indeterminate DVT involving right femoral, right gastrocnemius/popliteal and right popliteal veins. Dr. Marin Olp recommends continuing Lovenox for now and move to NOAC on outpatient basis? In one week?  PT/OT evaluation done yesterday revealing right sided weakness with sensory deficits affecting mobility and ADLs. She reports that she sat up in a chair for 30 minutes yesterday and nursing reports that they are taking her to BR for toileting as patient "very independent". CIR recommended due to recent functional decline.    Pt walked with therapy today 5-7 feet with 2 person assist- appeasred to be mod assist of 2, however was able to advance R leg as well as Left.  C/o severe pain in R calf- also developing a foot drop/PF contracture per PT already which she thinks is related. R calf hurts so bad it brings tears to her eyes, per pt.     Review of Systems  Constitutional: Negative for chills and fever.  HENT: Negative for hearing loss and tinnitus.   Eyes: Negative for blurred vision and double vision.  Respiratory: Positive for shortness of breath. Negative for cough.   Cardiovascular: Positive for chest pain (Left lower rib pain radiating to the back).  Gastrointestinal: Negative for heartburn, nausea and vomiting.  Genitourinary: Negative for dysuria and urgency.  Musculoskeletal: Positive for myalgias. Negative for falls.  Skin: Negative for itching and rash.  Neurological: Positive for sensory change, focal weakness and weakness. Negative for dizziness and headaches.  Psychiatric/Behavioral: The patient is nervous/anxious.   All other systems reviewed and are negative.    Past Medical History:  Diagnosis Date  . Bronchitis   .  Hypertension   . Osteoarthritis of metacarpophalangeal (MCP) joint of left thumb   . Pelvic ring fracture ([place]) 09/2016   left    Past Surgical History:  Procedure Laterality Date  .  APPLICATION OF CRANIAL NAVIGATION Left 04/05/2019   Procedure: APPLICATION OF CRANIAL NAVIGATION;  Surgeon: Earnie Larsson, MD;  Location: [place];  Service: Neurosurgery;  Laterality: Left;  . CRANIOTOMY Left 04/05/2019   Procedure: LEFT FRONTAL CRANIOTOMY TUMOR EXCISION w/Brain Lab;  Surgeon: Earnie Larsson, MD;  Location: [place];  Service: Neurosurgery;  Laterality: Left;  LEFT FRONTAL CRANIOTOMY TUMOR EXCISION w/Brain Lab    Family History  Problem Relation Age of Onset  . CAD Father        CABG  . Stroke Father   . Cancer Brother        lymphoma    Social History:  Married. Independent without AD PTA.  Works as a Geophysicist/field seismologist. Husband is a Forensic psychologist. She reports that she has been smoking cigarettes-- 1/2 PPD since her early 51's.  She has never used smokeless tobacco. She reports that she does not drink alcohol or use drugs.   Allergies  Allergen Reactions  . Codeine   . Lisinopril-Hydrochlorothiazide Other (See Comments)    Back pain    Medications Prior to Admission  Medication Sig Dispense Refill  . amLODipine (NORVASC) 10 MG tablet TAKE 1 TABLET (10 MG TOTAL) BY MOUTH DAILY. NEED OFFICE VISIT (Patient taking differently: Take 10 mg by mouth daily. ) 90 tablet 0  . aspirin 81 MG tablet Take 81 mg by mouth daily.    Marland Kitchen gabapentin (NEURONTIN) 300 MG capsule Take 300 mg by mouth 3 (three) times daily.     . hydrALAZINE (APRESOLINE) 25 MG tablet TAKE 1 TABLET BY MOUTH TWICE A DAY (Patient taking differently: Take 25 mg by mouth 2 (two) times daily. ) 60 tablet 12  . ibuprofen (ADVIL) 800 MG tablet Take 800 mg by mouth 3 (three) times daily as needed for moderate pain.       Home: Home Living Family/patient expects to be discharged to:: Private residence Living Arrangements: Spouse/significant other, Parent Available Help at Discharge: Family Type of Home: House Home Access: [place]: One level Bathroom Shower/Tub: Multimedia programmer:  Handicapped height Bathroom Accessibility: Yes Home Equipment: Environmental consultant - 2 wheels, [place] - single point  Functional History: Prior Function Level of Independence: Independent Comments: fully independent prior, weakness started ~2-3 weeks prior to sx Functional Status:  Mobility: Bed Mobility Overal bed mobility: Needs Assistance Bed Mobility: Supine to Sit Supine to sit: Mod assist, HOB elevated, +2 for safety/equipment General bed mobility comments: mod assist for RLE and RUE management, light trunk elevation and steadying assist. Increased time and effort with use of bedrails to steady self once sitting EOB. Transfers Overall transfer level: Needs assistance Equipment used: 2 person hand held assist Transfers: Sit to/from Stand Sit to Stand: Mod assist, From elevated surface, +2 physical assistance, +2 safety/equipment General transfer comment: Mod assist +2 for power up, hip extension to neutral, and steadying. Pre-gait steps taken bilaterally with heavy RLE knee extension facilitation and guarding. Ambulation/Gait Ambulation/Gait assistance: Mod assist, +2 safety/equipment, +2 physical assistance Gait Distance (Feet): 4 Feet Assistive device: 2 person hand held assist Gait Pattern/deviations: Step-to pattern, Decreased step length - right, Decreased weight shift to right, Drifts right/left, Narrow base of support General Gait Details: Mod assist +2 for trunk support, weight shifting L and R. Verbal cuing for  weight shift L to progress RLE first with LLE vaulting noted to progress RLE, wait for PT RLE blocking then WB through RLE with progression of LLE. Steps x4 to get to recliner at bedside. Gait velocity: decr    ADL: ADL Overall ADL's : Needs assistance/impaired Eating/Feeding: Set up, Sitting Grooming: Set up, Sitting Upper Body Bathing: Moderate assistance, Sitting Lower Body Bathing: Moderate assistance, Sit to/from stand, Sitting/lateral leans Upper Body Dressing :  Moderate assistance, Sitting Lower Body Dressing: Moderate assistance, Sit to/from stand, Sitting/lateral leans Lower Body Dressing Details (indicate cue type and reason): mod A sitting EOB to don socks. Mod A to hold RLE in figure 4 and start sock donning process Toilet Transfer: Moderate assistance, Stand-pivot, BSC Toileting- Clothing Manipulation and Hygiene: Moderate assistance, Sit to/from stand Functional mobility during ADLs: Moderate assistance, +2 for physical assistance, +2 for safety/equipment General ADL Comments: pt limited by R sided weakness and decreased activity tolerance  Cognition: Cognition Overall Cognitive Status: Within Functional Limits for tasks assessed Orientation Level: Oriented X4 Cognition Arousal/Alertness: Awake/alert Behavior During Therapy: WFL for tasks assessed/performed Overall Cognitive Status: Within Functional Limits for tasks assessed General Comments: Pt is emotional at times about current mobility/RUE and RLE function, but very motivated to return to PLOF   Blood pressure (!) 158/84, pulse (!) 54, temperature 97.6 F (36.4 C), temperature source Oral, resp. rate 20, height 5\' 7"  (1.702 m), weight 60.8 kg, SpO2 98 %. Physical Exam  Nursing note and vitals reviewed. Constitutional: She is oriented to person, place, and time. She appears well-developed and well-nourished.  Thin female. Sitting up in bed--NAD  Sitting up in bedside chair- saw her walk with PT of 2 people, attached to monitor, so couldn't go far, but did specifically advance her own R foot; NAD; hair disheveled.  HENT:  Mouth/Throat: Oropharynx is clear and moist. No oropharyngeal exudate.  Crani incision with staples and crusted with dried blood.  In middle of top of head.  No facial asymmetry seen- facial sensation intact  Eyes:  EOMI B/L Sclera not injected B/L  Neck: No tracheal deviation present.  Cardiovascular:  RRR  Respiratory: Effort normal.  CTA B/L  GI: She  exhibits no distension.  abd soft, NT, ND, (+)BS- hypoactive  Musculoskeletal:        General: No tenderness or edema.     Cervical back: Normal range of motion and neck supple.     Comments:  LUE biceps/triceps/WE/grip and finger abd 5/5 LLE- HF/KE/KF/DF/PF and EHL 5/5  RUE- 0/5 in all muscles tested as above RLE- HF 1/5, KE 2-/5, KF 2-/5, DF 0/5, PF 2/5, EHL 2-/5    Neurological: She is alert and oriented to person, place, and time.  Speech clear. Able to follow one and two step commands without difficulty.   R calf tight with slightly increased tone in R ankle- keeps foot in PF at rest   Skin:  No skin breakdown seen- didn't look at backside  Psychiatric: Her speech is normal. Her mood appears anxious.  Affect slightly anxious     Results for orders placed or performed during the hospital encounter of 04/01/19 (from the past 24 hour(s))  CBC     Status: Abnormal   Collection Time: 04/09/19  6:29 AM  Result Value Ref Range   WBC 15.8 (H) 4.0 - 10.5 K/uL   RBC 4.90 3.87 - 5.11 MIL/uL   Hemoglobin 15.1 (H) 12.0 - 15.0 g/dL   HCT 44.9 36.0 - 46.0 %  MCV 91.6 80.0 - 100.0 fL   MCH 30.8 26.0 - 34.0 pg   MCHC 33.6 30.0 - 36.0 g/dL   RDW 12.3 11.5 - 15.5 %   Platelets 359 150 - 400 K/uL   nRBC 0.0 0.0 - 0.2 %  Basic metabolic panel     Status: Abnormal   Collection Time: 04/09/19  6:29 AM  Result Value Ref Range   Sodium 138 135 - 145 mmol/L   Potassium 4.1 3.5 - 5.1 mmol/L   Chloride 105 98 - 111 mmol/L   CO2 22 22 - 32 mmol/L   Glucose, Bld 99 70 - 99 mg/dL   BUN 18 8 - 23 mg/dL   Creatinine, Ser 0.52 0.44 - 1.00 mg/dL   Calcium 8.7 (L) 8.9 - 10.3 mg/dL   GFR calc non Af Amer >60 >60 mL/min   GFR calc Af Amer >60 >60 mL/min   Anion gap 11 5 - 15   VAS [place] LOWER EXTREMITY VENOUS (DVT)  Result Date: 04/09/2019  Lower Venous Study Indications: Swelling, and Pain.  Risk Factors: Surgery left craniotomy tumor on 04/05/19. Comparison Study: No priors. Performing  Technologist: Oda Cogan RDMS, RVT  Examination Guidelines: A complete evaluation includes B-mode imaging, spectral Doppler, color Doppler, and power Doppler as needed of all accessible portions of each vessel. Bilateral testing is considered an integral part of a complete examination. Limited examinations for reoccurring indications may be performed as noted.  +---------+---------------+---------+-----------+----------+-----------------+ RIGHT    CompressibilityPhasicitySpontaneityPropertiesThrombus Aging    +---------+---------------+---------+-----------+----------+-----------------+ CFV      Full           Yes      Yes                                    +---------+---------------+---------+-----------+----------+-----------------+ SFJ      Full                                                           +---------+---------------+---------+-----------+----------+-----------------+ FV Prox  None                                         Age Indeterminate +---------+---------------+---------+-----------+----------+-----------------+ FV Mid   None                                         Age Indeterminate +---------+---------------+---------+-----------+----------+-----------------+ FV DistalNone                                         Age Indeterminate +---------+---------------+---------+-----------+----------+-----------------+ PFV      Full                                         Age Indeterminate +---------+---------------+---------+-----------+----------+-----------------+ POP      None           No  No                   Age Indeterminate +---------+---------------+---------+-----------+----------+-----------------+ PTV      None                                         Age Indeterminate +---------+---------------+---------+-----------+----------+-----------------+ PERO     None                                         Age Indeterminate  +---------+---------------+---------+-----------+----------+-----------------+ Gastroc  None                                                           +---------+---------------+---------+-----------+----------+-----------------+ Thrombus appears sub-acute.  +---------+---------------+---------+-----------+----------+--------------+ LEFT     CompressibilityPhasicitySpontaneityPropertiesThrombus Aging +---------+---------------+---------+-----------+----------+--------------+ CFV      Full           Yes      Yes                                 +---------+---------------+---------+-----------+----------+--------------+ SFJ      Full                                                        +---------+---------------+---------+-----------+----------+--------------+ FV Prox  Full                                                        +---------+---------------+---------+-----------+----------+--------------+ FV Mid   Full                                                        +---------+---------------+---------+-----------+----------+--------------+ FV DistalFull                                                        +---------+---------------+---------+-----------+----------+--------------+ PFV      Full                                                        +---------+---------------+---------+-----------+----------+--------------+ POP      Full           Yes      Yes                                 +---------+---------------+---------+-----------+----------+--------------+  PTV      Full                                                        +---------+---------------+---------+-----------+----------+--------------+ PERO     Full                                                        +---------+---------------+---------+-----------+----------+--------------+    Summary: Right: Findings consistent with age indeterminate deep vein thrombosis  involving the right femoral vein, right popliteal vein, right posterior tibial veins, right peroneal veins, and right gastrocnemius veins. Left: There is no evidence of deep vein thrombosis in the lower extremity.  *See table(s) above for measurements and observations. Electronically signed by Harold Barban MD on 04/09/2019 at 8:29:58 AM.    Final      Assessment/Plan: Diagnosis: CA and R hemiplegia due to mets to brain s/p craniotomy 1. Does the need for close, 24 hr/day medical supervision in concert with the patient's rehab needs make it unreasonable for this patient to be served in a less intensive setting? Yes 2. Co-Morbidities requiring supervision/potential complications: HTN, lung CA- need for palliative radiation, spasticity, PF contracture formation 3. Due to bowel management, safety, skin/wound care, disease management, medication administration, pain management and patient education, does the patient require 24 hr/day rehab nursing? Yes 4. Does the patient require coordinated care of a physician, rehab nurse, therapy disciplines of PT/OT and possibly SLP to address physical and functional deficits in the context of the above medical diagnosis(es)? Yes Addressing deficits in the following areas: balance, endurance, locomotion, strength, transferring, bathing, dressing, grooming, toileting, cognition and psychosocial support 5. Can the patient actively participate in an intensive therapy program of at least 3 hrs of therapy per day at least 5 days per week? Yes 6. The potential for patient to make measurable gains while on inpatient rehab is good 7. Anticipated functional outcomes upon discharge from inpatient rehab are supervision and min assist  with PT, supervision and min assist with OT, supervision with SLP. 8. Estimated rehab length of stay to reach the above functional goals is: 14-18 days 9. Anticipated discharge destination: Home 10. Overall Rehab/Functional Prognosis:  good  RECOMMENDATIONS: This patient's condition is appropriate for continued rehabilitative care in the following setting: CIR Patient has agreed to participate in recommended program. Yes Note that insurance prior authorization may be required for reimbursement for recommended care.  Comment:  1. Pt would benefit from a PRAFO that can be ordered via [employer] in orders- for RLE- to prevent foot drop and to be worn when in bed/at least nighttime to prevent Plantarflexion contracture  2. Could also benefit from Baclofen 5 mg TID for developing spasticity of RLE- that's probably cause of R calf pain- no guarantees, but likely the cause. Usually well tolerated at that low dose.     Bary Leriche, PA-C 04/09/2019

## 2019-04-08 NOTE — Progress Notes (Signed)
OT Cancellation Note  Patient Details Name: Aidel Davisson MRN: [id number] DOB: 10-Feb-1956   Cancelled Treatment:    Reason Eval/Treat Not Completed: Medical issues which prohibited therapy Pt awaiting LE dopplar to r/o DVT. OT will check back as schedule allows to continue OT POC.  Zenovia Jarred, MSOT, OTR/L Behavioral Health OT/ Acute Relief OT [facility] Office: [place] 04/08/2019, 9:45 AM

## 2019-04-08 NOTE — Progress Notes (Signed)
Chaplain visited with patient to briefly discuss the completion of an Advanced Directive.  The chaplain will follow-up when the patient's family arrives to attempt to complete the AD paperwork.  Brion Aliment Chaplain Resident For questions concerning this note please contact me by pager [phone]

## 2019-04-08 NOTE — Progress Notes (Deleted)
PT Cancellation Note  Patient Details Name: [name] MRN: [id number] DOB: March 25, 1956   Cancelled Treatment:    Reason Eval/Treat Not Completed: Pain limiting ability to participate;Medical issues which prohibited therapy - Pt received lovenox 3 hours ago for acute DVT, but pt in too much pain to mobilize per RN. Will check back tomorrow.  [place] Pager [phone]  Office [phone]    [place] 04/08/2019, 4:10 PM

## 2019-04-08 NOTE — Progress Notes (Signed)
There is a little bit of strength in the right arm.  Hopefully, this will continue to improve.  I am sure that she will need some physical therapy.  Hopefully, there is no blood clot in the right leg.  I know that she is at significant risk for thromboembolic disease.  The biopsy results not back yet.  They might be back today but more likely tomorrow.  She seems to be eating a little better.  She still having pain with the left shoulder.  I am not sure why she is having this pain.  While she is in the hospital, I would consider a bone scan on her.  She has not had labs since Friday.  We will see her back in some labs set up for tomorrow.  Hopefully, should be able to have some physical therapy.  I know this might be dictated by whether or not there is a thrombus in her right leg.   Lattie Haw, MD  Lurena Joiner 2:11

## 2019-04-08 NOTE — Progress Notes (Signed)
PT Cancellation Note  Patient Details Name: [name] MRN: [id number] DOB: 1955/06/10   Cancelled Treatment:    Reason Eval/Treat Not Completed: Medical issues which prohibited therapy - Pt awaiting LE dopplar to r/o DVT. PT to check back in afternoon, as schedule allows.  [place] Pager [phone]  Office [phone]    Roxine Caddy D Elonda Husky 04/08/2019, 9:42 AM

## 2019-04-08 NOTE — Progress Notes (Signed)
Postop day 3.  Patient newly no headache.  No new neurologic symptoms.  A little bit of improvement in her right upper and lower extremity strength.  Afebrile.  Vital signs are stable.  Awake and alert.  Oriented and appropriate.  Motor examination 2/5 right upper extremity 2-3/5 right lower extremity left upper and left lower extremity 5/5.  Wound clean and dry.  Patient with newly discovered right lower extremity proximal DVT.  Plan treatment with subcu Lovenox for now.  Will await 1 week postop before starting Eliquis.  Plan to get rehab consult

## 2019-04-08 NOTE — Progress Notes (Signed)
Venous duplex lower ext  has been completed. Refer to [facility] under chart review to view preliminary results. Positive results given to patient's nurse.  04/08/2019  10:25 AM Estell Dillinger, Bonnye Fava

## 2019-04-09 ENCOUNTER — Inpatient Hospital Stay (HOSPITAL_COMMUNITY): Payer: BC Managed Care – PPO

## 2019-04-09 ENCOUNTER — Encounter (HOSPITAL_COMMUNITY): Payer: Self-pay | Admitting: Neurosurgery

## 2019-04-09 DIAGNOSIS — G9389 Other specified disorders of brain: Secondary | ICD-10-CM

## 2019-04-09 DIAGNOSIS — C7951 Secondary malignant neoplasm of bone: Secondary | ICD-10-CM

## 2019-04-09 DIAGNOSIS — R29898 Other symptoms and signs involving the musculoskeletal system: Secondary | ICD-10-CM

## 2019-04-09 LAB — BASIC METABOLIC PANEL
Anion gap: 11 (ref 5–15)
BUN: 18 mg/dL (ref 8–23)
CO2: 22 mmol/L (ref 22–32)
Calcium: 8.7 mg/dL — ABNORMAL LOW (ref 8.9–10.3)
Chloride: 105 mmol/L (ref 98–111)
Creatinine, Ser: 0.52 mg/dL (ref 0.44–1.00)
GFR calc Af Amer: >60 mL/min (ref >60)
GFR calc non Af Amer: >60 mL/min (ref >60)
Glucose, Bld: 99 mg/dL (ref 70–99)
Potassium: 4.1 mmol/L (ref 3.5–5.1)
Sodium: 138 mmol/L (ref 135–145)

## 2019-04-09 LAB — CBC
HCT: 44.9 % (ref 36.0–46.0)
Hemoglobin: 15.1 g/dL — ABNORMAL HIGH (ref 12.0–15.0)
MCH: 30.8 pg (ref 26.0–34.0)
MCHC: 33.6 g/dL (ref 30.0–36.0)
MCV: 91.6 fL (ref 80.0–100.0)
Platelets: 359 10*3/uL (ref 150–400)
RBC: 4.9 MIL/uL (ref 3.87–5.11)
RDW: 12.3 % (ref 11.5–15.5)
WBC: 15.8 10*3/uL — ABNORMAL HIGH (ref 4.0–10.5)
nRBC: 0 % (ref 0.0–0.2)

## 2019-04-09 LAB — NEURON-SPECIFIC ENOLASE(NSE), BLOOD: Neuron Specific Enolase: 10.2 ng/mL (ref 0.0–12.5)

## 2019-04-09 LAB — SURGICAL PATHOLOGY

## 2019-04-09 MED ORDER — TECHNETIUM TC 99M MEDRONATE IV KIT
21.3000 | PACK | Freq: Once | INTRAVENOUS | Status: AC | PRN
  Administered 2019-04-09: 21.3 via INTRAVENOUS

## 2019-04-09 NOTE — Progress Notes (Signed)
I called and spoke with the patient and also spoke with her husband, [name], to inform them of the final pathology report and our plan going forward.  Final pathology confirms a non-small cell primary lung cancer so we will plan to move forward with a single fraction of stereotactic radiosurgery [facility]) to the resection cavity as discussed previously.  We again discussed this in detail today and they appear to have a good understanding of this treatment recommendations and are comfortable and in agreement with the plan. We will coordinate this procedure with her neurosurgeon, Dr. Annette Stable and will be back in touch with details as they are ironed out.  We also discussed the recommendation for a 2-week course of palliative radiotherapy directed to the left chest wall (left upper rib lesion and T5 pedicle) to help better manage her pain which has been persistent but currently manageable.  We will coordinate these treatments to begin at the same time as her postop SRS brain treatment, all of which can be delivered on an outpatient basis should she make significant progress with inpatient rehab and be ready for discharge home prior to the start of her treatments.  They understand that the timing of her discharge will be at the discretion of her inpatient rehab and medical team and upcoming radiation treatments will not have any bearing on that timing as these treatments can certainly be delivered on an outpatient basis.  I also informed Veria that should she have any progression in the left chest wall pain that begins to interfere with her rehab and/or is no longer well managed with positioning and/or pain medications, we could always consider starting her palliative radiotherapy to the left chest wall sooner than later.  They were both thankful for the follow-up call today and on board with the stated plan. We look forward to continuing to follow along in her progress and seeing her in the office soon for treatment  planning.  Nicholos Johns, MMS, PA-C [employer] at [place]: [phone]  Fax: [phone]

## 2019-04-09 NOTE — Progress Notes (Signed)
Providing Compassionate, Quality Care - Together   Subjective: Patient reports right lower extremity leg pain and restlessness that is worse at night. She continues to have right-sided weakness of her upper and lower extremities. She is anxious to begin inpatient rehabilitation.  Objective: Vital signs in last 24 hours: Temp:  [97.6 F (36.4 C)-98.6 F (37 C)] 97.6 F (36.4 C) (12/15 0410) Pulse Rate:  [49-60] 54 (12/15 0410) BP: (113-158)/(68-94) 158/84 (12/15 0410) SpO2:  [96 %-98 %] 98 % (12/15 0410)  Intake/Output from previous day: 12/14 0701 - 12/15 0700 In: 780 [P.O.:480; I.V.:300] Out: 100 [Urine:100] Intake/Output this shift: No intake/output data recorded.  Alert and oriented x 4 PERRLA CN II-XII grossly intact Motor examination: 2/5 right upper extremity 2-3/5 right lower extremity left upper and left lower extremity 5/5 Incision is clean, dry, and intact; staples in place   Lab Results: Recent Labs    04/09/19 0629  WBC 15.8*  HGB 15.1*  HCT 44.9  PLT 359   BMET Recent Labs    04/09/19 0629  NA 138  K 4.1  CL 105  CO2 22  GLUCOSE 99  BUN 18  CREATININE 0.52  CALCIUM 8.7*    Studies/Results: VAS [place] LOWER EXTREMITY VENOUS (DVT)  Result Date: 04/09/2019  Lower Venous Study Indications: Swelling, and Pain.  Risk Factors: Surgery left craniotomy tumor on 04/05/19. Comparison Study: No priors. Performing Technologist: Oda Cogan RDMS, RVT  Examination Guidelines: A complete evaluation includes B-mode imaging, spectral Doppler, color Doppler, and power Doppler as needed of all accessible portions of each vessel. Bilateral testing is considered an integral part of a complete examination. Limited examinations for reoccurring indications may be performed as noted.  +---------+---------------+---------+-----------+----------+-----------------+ RIGHT    CompressibilityPhasicitySpontaneityPropertiesThrombus Aging     +---------+---------------+---------+-----------+----------+-----------------+ CFV      Full           Yes      Yes                                    +---------+---------------+---------+-----------+----------+-----------------+ SFJ      Full                                                           +---------+---------------+---------+-----------+----------+-----------------+ FV Prox  None                                         Age Indeterminate +---------+---------------+---------+-----------+----------+-----------------+ FV Mid   None                                         Age Indeterminate +---------+---------------+---------+-----------+----------+-----------------+ FV DistalNone                                         Age Indeterminate +---------+---------------+---------+-----------+----------+-----------------+ PFV      Full  Age Indeterminate +---------+---------------+---------+-----------+----------+-----------------+ POP      None           No       No                   Age Indeterminate +---------+---------------+---------+-----------+----------+-----------------+ PTV      None                                         Age Indeterminate +---------+---------------+---------+-----------+----------+-----------------+ PERO     None                                         Age Indeterminate +---------+---------------+---------+-----------+----------+-----------------+ Gastroc  None                                                           +---------+---------------+---------+-----------+----------+-----------------+ Thrombus appears sub-acute.  +---------+---------------+---------+-----------+----------+--------------+ LEFT     CompressibilityPhasicitySpontaneityPropertiesThrombus Aging +---------+---------------+---------+-----------+----------+--------------+ CFV      Full           Yes       Yes                                 +---------+---------------+---------+-----------+----------+--------------+ SFJ      Full                                                        +---------+---------------+---------+-----------+----------+--------------+ FV Prox  Full                                                        +---------+---------------+---------+-----------+----------+--------------+ FV Mid   Full                                                        +---------+---------------+---------+-----------+----------+--------------+ FV DistalFull                                                        +---------+---------------+---------+-----------+----------+--------------+ PFV      Full                                                        +---------+---------------+---------+-----------+----------+--------------+ POP      Full  Yes      Yes                                 +---------+---------------+---------+-----------+----------+--------------+ PTV      Full                                                        +---------+---------------+---------+-----------+----------+--------------+ PERO     Full                                                        +---------+---------------+---------+-----------+----------+--------------+    Summary: Right: Findings consistent with age indeterminate deep vein thrombosis involving the right femoral vein, right popliteal vein, right posterior tibial veins, right peroneal veins, and right gastrocnemius veins. Left: There is no evidence of deep vein thrombosis in the lower extremity.  *See table(s) above for measurements and observations. Electronically signed by Harold Barban MD on 04/09/2019 at 8:29:58 AM.    Final     Assessment/Plan: Patient is four days status post left frontal craniotomy for tumor resection. The pathology came back as adenocarcinoma. Venous dopplers of her lower  extremities on 04/08/2019 revealed a right-sided femoral vein DVT. She is currently being treated with Lovenox with the plan to switch her over to Eliquis at one week post-op.   LOS: 8 days    -Oncology has ordered a bone scan for today -Staples can be removed on day 10 post-op -Continue subcutaneous Lovenox for anticoagulation until one week post-op -Continue mobilization with therapies  Viona [name], DNP, AGNP-C Nurse Practitioner  [facility] [place] [address], [place], [place] [address] P: [phone]    F: [phone]  04/09/2019, 10:09 AM

## 2019-04-09 NOTE — Care Management (Signed)
Per Lasandra Beech W/BCBSNC Co-pay amount for Eliquis 5mg . bid for a 30 day supply $25.00. No PA required No deductible Tier 2 medication Pt..may use any retail pharmacy. Ref# [id number].

## 2019-04-09 NOTE — PMR Pre-admission (Signed)
PMR Admission Coordinator Pre-Admission Assessment  Patient: [name] is an 63 y.o., female MRN: [id number] DOB: [birth year] Height: '5\' 7"'  (170.2 cm) Weight: 60.8 kg              Insurance Information HMO:     PPO: yes     PCP:      IPA:      80/20:      OTHER:  PRIMARY: [employer] [place]      Policy#: [id number]      Subscriber: Patient CM Name: Merrilyn Puma      Phone#: [phone]     Fax#: [phone] Pre-Cert#: [id number]      Employer:  [employer] provided by Ree Shay for admit to CIR Effective dates: 12/16 with clinical updates due 12/30. Updates due to (f): [phone] Benefits:  Phone #: [phone]     Name:  Eff. Date: 04/25/2018 - 04/25/2019     Deduct: $7,500 ($806.15 met)      Out of Pocket Max: $8,150 (if still active on 04/26/2019, will re-set, max includes deductible - $1,669.69 met)      Life Max: NA CIR: after deductible, 60% coverage, 40% co-insurance      SNF: after deductible, 60% coverage, 40% co-insurance; with a limit of 60 days/cal yr Outpatient: $115/visit co-pay; limited to 30 visits combined PT/OT rehabilitative, 30 visits combined PT/OT habilitative, 30 visits ST rehabilitative, 30 visits habilitative    Home Health: after deductible, 60% coverage, 40% co-insurance; limited by medical necessity     DME: after deductible, 60% coverage, 40% co-insurance      Providers:  SECONDARY: None      Policy#:       Subscriber:  CM Name:       Phone#:      Fax#:  Pre-Cert#:       Employer:  Benefits:  Phone #:      Name:  Eff. Date:      Deduct:       Out of Pocket Max:       Life Max CIR:       SNF:  Outpatient:      Co-Pay:  Home Health:       Co-Pay:  DME:      Co-Pay:   Medicaid Application Date:       Case Manager:  Disability Application Date:       Case Worker:   The "Data Collection Information Summary" for patients in Inpatient Rehabilitation Facilities with attached "[place] Records" was provided and verbally reviewed with:  N/A  Emergency Contact Information Contact Information    Name Relation Home Work Mobile   Fry,Eric Spouse   [phone]     Current Medical History  Patient Admitting Diagnosis: Cancer and right hemiplegia due to mets to brain s/p craniotomy.   History of Present Illness: [name] is a 63 y.o. female with history of HTN, OA who otherwise was in good health who started developing right sided weakness with foot drag and difficulty lifting her arm as well as left sided chest pain under her left breast for about 2-3 weeks prior to showing up for evaluation in ED on 04/01/19. CT head done revealing large area of vasogenic edema in left frontal lobe concerning for metastatic disease. Neurology recommended MRI brain for work up and this revealed presumed solitary left frontal lobe mass with moderate vasogenic edema and extensive small vessel disease.  She was started on Keppra and IV decadron and transferred to [facility] for  further management.   CT chest, abdomen and pelvis revealed 3.0 X2.4X 3.6 cm spiculated mass in medial right upper lobe compatible with primary bronchogenic neoplasm with additional bilateral pulmonary metastases in LUL and BLL, thoracic nodal metastases, left supraclavicular nodal metastasis, hepatic cyst and lytic osseous metastasis along anterior aspect of L3 vertebral body. Hem/onc--Dr. Marin Olp consulted for input and recommended stereotactic crani with resection of tumor for likely primary bronchogenic carcinoma RUL.  Radiation Onc consulted for palliiative Tx of left chest wall due to ongoing severe pain in back and left lower chest wall and plans are to start 2 week course treatment after pathology available. Dr. Annette Stable consulted and patient underwent left frontal crani with resection of tumor on 12/11.  Pathology positive for adenocarcinoma and neurosurgery recommends holding off on other treatment for a week. Treatment options to be discussed with tumor board 12/21.   She  continues to have right sided weakness with numbness and steroids being weaned off. She developed severe right calf pain on 12/13 pm and  BLE dopplers done revealing age indeterminate DVT involving right femoral, right gastrocnemius/popliteal and right popliteal veins. Dr. Marin Olp recommends continuing Lovenox for now. PT/OT evaluation done revealing right sided weakness with sensory deficits affecting mobility and ADLs. CIR recommended due to recent functional decline. Pt is to admit to CIR on 04/11/2019.     Complete NIHSS TOTAL: 10 Glasgow Coma Scale Score: 15  Past Medical History  Past Medical History:  Diagnosis Date  . Bronchitis   . Hypertension   . Osteoarthritis of metacarpophalangeal (MCP) joint of left thumb   . Pelvic ring fracture ([place]) 09/2016   left    Family History  family history includes CAD in her father; Cancer in her brother; Stroke in her father.  Prior Rehab/Hospitalizations:  Has the patient had prior rehab or hospitalizations prior to admission? No  Has the patient had major surgery during 100 days prior to admission? Yes  Current Medications   Current Facility-Administered Medications:  .  0.9 %  sodium chloride infusion, , Intravenous, Continuous, Pool, Mallie Mussel, MD, Last Rate: 75 mL/hr at 04/09/19 0418, New Bag at 04/09/19 0418 .  acetaminophen (TYLENOL) tablet 650 mg, 650 mg, Oral, Q4H PRN, 650 mg at 04/07/19 0943 **OR** acetaminophen (TYLENOL) suppository 650 mg, 650 mg, Rectal, Q4H PRN, Earnie Larsson, MD .  Derrill Memo ON 04/13/2019] dexamethasone (DECADRON) tablet 1 mg, 1 mg, Oral, Q8H, Bergman, Meghan D, NP .  [COMPLETED] dexamethasone (DECADRON) tablet 6 mg, 6 mg, Oral, Q6H, 6 mg at 04/06/19 1206 **FOLLOWED BY** [COMPLETED] dexamethasone (DECADRON) tablet 4 mg, 4 mg, Oral, Q6H, 4 mg at 04/07/19 1224 **FOLLOWED BY** dexamethasone (DECADRON) tablet 2 mg, 2 mg, Oral, Q8H, Bergman, Meghan D, NP, 2 mg at 04/11/19 0430 .  diphenhydrAMINE (BENADRYL) capsule 25 mg,  25 mg, Oral, Q8H PRN, Pool, Henry, MD .  enoxaparin (LOVENOX) injection 60 mg, 1 mg/kg, Subcutaneous, BID, Bergman, Meghan D, NP, 60 mg at 04/11/19 0951 .  feeding supplement (ENSURE ENLIVE) (ENSURE ENLIVE) liquid 237 mL, 237 mL, Oral, BID BM, Earnie Larsson, MD, 237 mL at 04/10/19 1827 .  gabapentin (NEURONTIN) capsule 300 mg, 300 mg, Oral, TID PRN, Earnie Larsson, MD, 300 mg at 04/09/19 0417 .  HYDROcodone-acetaminophen (NORCO/VICODIN) 5-325 MG per tablet 1 tablet, 1 tablet, Oral, Q4H PRN, Earnie Larsson, MD, 1 tablet at 04/11/19 0430 .  HYDROmorphone (DILAUDID) injection 0.5-1 mg, 0.5-1 mg, Intravenous, Q2H PRN, Earnie Larsson, MD, 1 mg at 04/11/19 0801 .  labetalol (NORMODYNE) injection 10-40  mg, 10-40 mg, Intravenous, Q10 min PRN, Earnie Larsson, MD, 20 mg at 04/06/19 1030 .  levETIRAcetam (KEPPRA) tablet 500 mg, 500 mg, Oral, BID, Ostergard, Joyice Faster, MD, 500 mg at 04/11/19 0951 .  naloxone [facility]) injection 0.08 mg, 0.08 mg, Intravenous, PRN, Earnie Larsson, MD .  nicotine (NICODERM CQ - dosed in mg/24 hr) patch 7 mg, 7 mg, Transdermal, Daily, Ostergard, Thomas A, MD .  ondansetron (ZOFRAN) tablet 4 mg, 4 mg, Oral, Q4H PRN **OR** ondansetron (ZOFRAN) injection 4 mg, 4 mg, Intravenous, Q4H PRN, Earnie Larsson, MD, 4 mg at 04/06/19 1032 .  pantoprazole (PROTONIX) EC tablet 40 mg, 40 mg, Oral, Q supper, Alvira Philips, [place], 40 mg at 04/10/19 1702 .  promethazine (PHENERGAN) tablet 12.5-25 mg, 12.5-25 mg, Oral, Q4H PRN, Earnie Larsson, MD .  senna-docusate (Senokot-S) tablet 1 tablet, 1 tablet, Oral, QHS PRN, Earnie Larsson, MD  Patients Current Diet:  Diet Order            Diet regular Room service appropriate? 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 No AD use PTA  Prior Functional Level Prior Function Level of Independence: Independent Comments: fully independent prior, weakness started ~2-3 weeks prior to sx  Self Care: Did the patient need help bathing, dressing, using the toilet or eating?  Independent  Indoor Mobility: Did the patient need assistance with walking from room to room (with or without device)? Independent  Stairs: Did the patient need assistance with internal or external stairs (with or without device)? Independent  Functional Cognition: Did the patient need help planning regular tasks such as shopping or remembering to take medications? Independent  Home Assistive Devices / Equipment Home Assistive Devices/Equipment: Eyeglasses Home Equipment: Walker - 2 wheels, Cane - single point  Prior Device Use: Indicate devices/aids used by the patient prior to current illness, exacerbation or injury? None of the above but because her parents live with her, she has a ramp for safe entry, vehicle lift for wc, hoyer lift, seat in shower, and a wheelchair accessible home.   Current Functional Level Cognition  Overall Cognitive Status: Within Functional Limits for tasks assessed Orientation Level: Oriented X4 General Comments: Pt states she had an emotional morning, but feels better now    Extremity Assessment (includes Sensation/Coordination)  Upper Extremity Assessment: RUE deficits/detail, LUE deficits/detail RUE Deficits / Details: trace movement in trap, deltoid, and bicep. Decreased sensation throughout- needs more detailed testing LUE Deficits / Details: WFL  Lower Extremity Assessment: Overall WFL for tasks assessed, RLE deficits/detail RLE Deficits / Details: hip flexion 2-/5, knee extension 2+/5, knee flexion 1/5, DF/PF 0/5. Hypertonicity R PF RLE Sensation:  WNL RLE Coordination: decreased gross motor, decreased fine motor    ADLs  Overall ADL's : Needs assistance/impaired Eating/Feeding: Set up, Sitting Grooming: Set up, Sitting Upper Body Bathing: Moderate assistance, Sitting Lower Body Bathing: Moderate assistance, Sit to/from stand, Sitting/lateral leans Upper Body Dressing : Moderate assistance, Sitting Lower Body Dressing: Moderate assistance, Sit to/from stand, Sitting/lateral leans Lower Body Dressing Details (indicate cue type and reason): mod A sitting EOB to  don socks. Mod A to hold RLE in figure 4 and start sock donning process Toilet Transfer: Moderate assistance, Stand-pivot, BSC Toileting- Clothing Manipulation and Hygiene: Moderate assistance, Sit to/from stand Functional mobility during ADLs: Moderate assistance, +2 for physical assistance, +2 for safety/equipment General ADL Comments: pt limited by R sided weakness and decreased activity tolerance    Mobility  Overal bed mobility: Needs Assistance Bed Mobility: Supine to Sit Supine to sit: Min assist Sit to supine: Min assist General bed mobility comments: min assist for RLE and RUE management, pt with increased time to scoot to EOB but no physical assist needed.    Transfers  Overall transfer level: Needs assistance Equipment used: 1 person hand held assist Transfers: Sit to/from Stand Sit to Stand: Mod assist, +2 safety/equipment, Min assist General transfer comment: Sit to stand x4 during session due to seated rest breaks during gait training. Initially mod assist for power up, steadying, and RLE blocking, required min assist with continued repetitions.    Ambulation / Gait / Stairs / Wheelchair Mobility  Ambulation/Gait Ambulation/Gait assistance: Mod assist, +2 safety/equipment Gait Distance (Feet): 5 Feet(3x5 ft) Assistive device: 1 person hand held assist Gait Pattern/deviations: Step-to pattern, Decreased step length - right, Drifts right/left, Narrow base of  support, Decreased weight shift to right, Step-through pattern, Decreased stance time - right General Gait Details: mod assist for trunk support, physical assist with weight shift L and R, RLE blocking with RLE stance phase, and RUE support in standing.3 bouts of 5 ft ambulation, seated rest break x2 minutes between each round ambulation. Gait velocity: decr    Posture / Balance Dynamic Sitting Balance Sitting balance - Comments: able to sit EOC without imbalance Balance Overall balance assessment: Needs assistance Sitting-balance support: Feet supported, No upper extremity supported Sitting balance-Leahy Scale: Fair Sitting balance - Comments: able to sit EOC without imbalance Postural control: Posterior lean Standing balance support: Single extremity supported Standing balance-Leahy Scale: Poor Standing balance comment: reliant on external support in standing    Special needs/care consideration BiPAP/CPAP: no CPM: no Continuous Drip IV: 0.9% sodium chloride infusion  Dialysis: no        Days: no Life Vest: no Oxygen: no Special Bed: no Trach Size: no Wound Vac (area): no      Location: no Skin: surgical incision to head                         Bowel mgmt: last BM 04/04/2019 Bladder mgmt: continent  Diabetic mgmt: no Behavioral consideration : no Chemo/radiation : Plans are to begin this in January.  Appointments per Radiation Oncology (spoke with Aldona Bar RN to confirm [phone]): -Discussion of pathology with Tumor Board on Dec 21st (pt is not present for this discussion)  -Jan 4 at 2pm schedule for MRI at [facility] -Jan 5th at [facility] 9:00am, 9:30AM, 10:00AM, 10:30AM.  -Jan 7th at [facility] 9:30AM (approx 45 min appointment)    Previous Home Environment (from acute therapy documentation) Living Arrangements: Spouse/significant other, Parent Available Help at Discharge: Family Type of Home: [place]: One level Home Access: Ramped entrance Bathroom  Shower/Tub: Multimedia programmer: Handicapped height [place]: No  Discharge [place] for Discharge Living Setting: Patient's home, Lives with (comment)(husband, her mom&dad, sister in law) Type of Home at Discharge: House Discharge Home Layout: One level Discharge Home Access: [place] entrance Discharge Bathroom Shower/Tub: Walk-in shower(with seat (has back but no arm  rests)) Discharge Bathroom Toilet: Handicapped height Discharge Bathroom Accessibility: Yes How Accessible: Accessible via wheelchair, Accessible via walker Does the patient have any problems obtaining your medications?: No  Social/Family/Support Systems Patient Roles: Spouse, Other (Comment)(owns own [place], spouse, close to parents) Contact Information: husband [name]): [phone] Anticipated Caregiver: husband and sister in law (both at home) Anticipated Caregiver's Contact Information: see above Ability/Limitations of Caregiver: Min A Caregiver Availability: 24/7 Discharge Plan Discussed with Primary Caregiver: Yes(with pt and husband) Is Caregiver In Agreement with Plan?: Yes Does Caregiver/Family have Issues with Lodging/Transportation while Pt is in Rehab?: No   Goals/Additional Needs Patient/Family Goal for Rehab: PT/OT: Supervision/Min A; SLP: NA Expected length of stay: 14-18 days Cultural Considerations: NA Dietary Needs: regular diet, thin liquids  Equipment Needs: TBD Special Service Needs: Stage 4 cancer, may be pending chemo and radiation  Pt/Family Agrees to Admission and willing to participate: Yes Program Orientation Provided & Reviewed with Pt/Caregiver Including Roles  & Responsibilities: Yes(pt and her husband)  Barriers to Discharge: Insurance for SNF coverage, Pending chemo/radiation   Decrease burden of Care through IP rehab admission: NA   Possible need for SNF placement upon discharge:Not anticipated; pt has  great social support at DC. Pt is motivated to return home with as much independence as possible and understands the plan is to return straight home after a CIR stay with assistance as needed. Pt has an accessible environment that can support a wheelchair if needed.    Patient Condition: This patient's medical and functional status has changed since the consult dated: 04/09/2019 in which the Rehabilitation Physician determined and documented that the patient's condition is appropriate for intensive rehabilitative care in an inpatient rehabilitation facility. See "History of Present Illness" (above) for medical update. Functional changes are: slight improvement in gait distance from 4 feet to 5 feet. Patient's medical and functional status update has been discussed with the Rehabilitation physician and patient remains appropriate for inpatient rehabilitation. Will admit to inpatient rehab today.  Preadmission Screen Completed By:  Raechel Ache, OT, 04/11/2019 10:37 AM ______________________________________________________________________   Discussed status with Dr. Ranell Patrick on 04/11/2019 at 10:06AM and received approval for admission today.  Admission Coordinator:  Raechel Ache, time 10:06AM/Date 04/11/2019

## 2019-04-09 NOTE — Progress Notes (Signed)
Inpatient Rehab Admissions:  Inpatient Rehab Consult received.  I met with pt and her husband at the bedside for rehabilitation assessment and to discuss goals and expectations of an inpatient rehab admission. Pt motivated to improve overall function and has very support family. Pt and her husband want to pursue CIR at this time. I await call back from attending service to see when pt might be ready for CIR. I will begin insurance authorization process once I hear back from attending service.   Jhonnie Garner, OTR/L  Rehab Admissions Coordinator  [phone] 04/09/2019 11:54 AM

## 2019-04-09 NOTE — Progress Notes (Signed)
Physical Therapy Treatment Patient Details Name: [name] MRN: [id number] DOB: Oct 24, 1955 Today's Date: 04/09/2019    History of Present Illness 63 yo female admitted to ED on 12/7 with RLE and RUE weakness and altered sensation over the past 2-3 weeks. CT head revealed L frontal lobe edema with mass, s/p L frontal craniotomy with tumor resection on 12/11. Pt with suspected SMA irritation post-operatively with increased weakness RUE/LE. Per oncology, pt with suspected stage IV bronchogenic carcinoma with mets to bilateral lungs, L3 vertebral body, and L thoracic node. Hospital course complicatied by RLE DVT, now therapeutic to see. PMH includes HTN, tobacco use, hip stress fracture 1 year ago with recurrence 1 month ago. 04/09/19 bone scan showed metastases: multiple ribs bilaterally, L3 vertebral body, and left acetabulum     PT Comments    Patient up in chair on arrival (up ~1.5 hours per nursing). Continues to require 2 person assist for safe ambulation and limited to short distances due to monitors and requires 2 person hands-on assist and cannot carry portable monitor while facilitating gait. Tolerated ambulation 5 ft x 2 (seated rest between) however with reports of incr RLE (calf) pain afterwards.    Follow Up Recommendations  CIR     Equipment Recommendations  Other (comment)(defer to next venue)    Recommendations for Other Services Rehab consult     Precautions / Restrictions Precautions Precautions: Fall Precaution Comments: craniotomy; pathologic fracture L3 Restrictions Weight Bearing Restrictions: No    Mobility  Bed Mobility Overal bed mobility: Needs Assistance Bed Mobility: Sit to Supine       Sit to supine: Min assist   General bed mobility comments: min assist to raise RLE onto bed  Transfers Overall transfer level: Needs assistance Equipment used: 2 person hand held assist Transfers: Sit to/from Stand Sit to Stand: Mod assist;+2 physical  assistance;+2 safety/equipment         General transfer comment: Mod assist +2 for power up, hip extension to neutral, and steadying.   Ambulation/Gait Ambulation/Gait assistance: Mod assist;+2 safety/equipment;+2 physical assistance Gait Distance (Feet): 5 Feet(seated rest, 5 ) Assistive device: 2 person hand held assist Gait Pattern/deviations: Step-to pattern;Decreased step length - right;Drifts right/left;Narrow base of support;Decreased weight shift to right Gait velocity: decr   General Gait Details: Mod assist +2 for trunk support, weight shifting L and R. Verbal cuing for weight shift L to progress RLE first with LLE vaulting noted to progress RLE, wait for PT RLE blocking then WB through RLE with progression of LLE. Chair brought to her after 5 feet   Stairs             Wheelchair Mobility    Modified Rankin (Stroke Patients Only)       Balance Overall balance assessment: Needs assistance Sitting-balance support: Feet supported;No upper extremity supported Sitting balance-Leahy Scale: Fair Sitting balance - Comments: able to sit EOC without imbalance Postural control: Posterior lean Standing balance support: Single extremity supported Standing balance-Leahy Scale: Poor Standing balance comment: reliant on external support in standing                            Cognition Arousal/Alertness: Awake/alert Behavior During Therapy: WFL for tasks assessed/performed Overall Cognitive Status: Within Functional Limits for tasks assessed  Exercises      General Comments General comments (skin integrity, edema, etc.): Pt reporting incr pain/spasms in rt calf and heel cord; noted to have ~20 degree plantarflexion contracture not reversed with prolonged stretch prior to ambulation      Pertinent Vitals/Pain Pain Assessment: 0-10 Faces Pain Scale: Hurts even more Pain Location: Rt calf; left chest  wall Pain Descriptors / Indicators: Sore;Discomfort Pain Intervention(s): Limited activity within patient's tolerance;Monitored during session;Repositioned    Home Living                      Prior Function            PT Goals (current goals can now be found in the care plan section) Acute Rehab PT Goals Patient Stated Goal: get back to my independence PT Goal Formulation: With patient/family Time For Goal Achievement: 04/22/19 Potential to Achieve Goals: Good Progress towards PT goals: Progressing toward goals    Frequency    Min 4X/week      PT Plan Current plan remains appropriate    Co-evaluation              AM-PAC PT "6 Clicks" Mobility   Outcome Measure  Help needed turning from your back to your side while in a flat bed without using bedrails?: A Little Help needed moving from lying on your back to sitting on the side of a flat bed without using bedrails?: A Lot Help needed moving to and from a bed to a chair (including a wheelchair)?: A Lot Help needed standing up from a chair using your arms (e.g., wheelchair or bedside chair)?: A Lot Help needed to walk in hospital room?: A Lot Help needed climbing 3-5 steps with a railing? : Total 6 Click Score: 12    End of Session Equipment Utilized During Treatment: Gait belt Activity Tolerance: Patient limited by fatigue;Patient limited by pain Patient left: with call bell/phone within reach;in bed;with bed alarm set Nurse Communication: Mobility status;Patient requests pain meds PT Visit Diagnosis: Other symptoms and signs involving the nervous system (R29.898);Muscle weakness (generalized) (M62.81);Difficulty in walking, not elsewhere classified (R26.2)     Time: 2863-8177 PT Time Calculation (min) (ACUTE ONLY): 31 min  Charges:  $Gait Training: 23-37 mins                      Arby Barrette, PT Pager [phone]    Rexanne Mano 04/09/2019, 5:23 PM

## 2019-04-09 NOTE — Progress Notes (Signed)
Patient noted to not have had a BM since 12-10. Patient offered laxative, refuses at this time due to night time. She says she wants to rest but may want in the morning. Fluids encouraged.

## 2019-04-09 NOTE — Progress Notes (Signed)
Unfortunately, looks like Ms. Mezo has a blood clot in her right leg.  She now is on Lovenox.  I think that we are probably going to have to keep her on Lovenox for a while.  Possibly, as an outpatient, we can change her over to one of the new oral anticoagulants.  Clearly, this blood clot is reflective of her malignancy.  The pathology came back as adenocarcinoma.  We will send off for molecular markers to see if there is any actionable mutation that we can utilize for treatment.  This will probably take a couple weeks before we can get the results back.  She is undergoing physical therapy.  I know that she is trying to do her best.  She still has weakness over on that right side.  Hopefully, this will come back over time.  Her labs show a white cell count of 15.8.  Hemoglobin 15.1 hematocrit 44.9 platelet count 359,000.  She probably will need a Port-A-Cath to be placed.  I suspect that we are probably going to have to utilize systemic chemotherapy for her malignancy.  She will need radiation therapy.  She will have her bone scan today.  We will see what is going on with the pain in the left shoulder.  We will continue to follow her along.  Again, this thromboembolic event is certainly going to complicate things.  Again I would just keep her on Lovenox for right now.  At some point, we can move her over to oral agents.  However, I think Lovenox probably is the best choice for her given that she has a malignancy that is probably "driving" her coagulability.  Lattie Haw, MD  1 Corinthians 12:9

## 2019-04-10 ENCOUNTER — Encounter: Payer: Self-pay | Admitting: *Deleted

## 2019-04-10 ENCOUNTER — Other Ambulatory Visit: Payer: Self-pay | Admitting: Radiation Therapy

## 2019-04-10 DIAGNOSIS — I82401 Acute embolism and thrombosis of unspecified deep veins of right lower extremity: Secondary | ICD-10-CM | POA: Diagnosis not present

## 2019-04-10 DIAGNOSIS — Q043 Other reduction deformities of brain: Secondary | ICD-10-CM | POA: Diagnosis not present

## 2019-04-10 DIAGNOSIS — M25512 Pain in left shoulder: Secondary | ICD-10-CM

## 2019-04-10 DIAGNOSIS — C3411 Malignant neoplasm of upper lobe, right bronchus or lung: Secondary | ICD-10-CM | POA: Diagnosis not present

## 2019-04-10 DIAGNOSIS — C7931 Secondary malignant neoplasm of brain: Secondary | ICD-10-CM | POA: Diagnosis not present

## 2019-04-10 DIAGNOSIS — C7949 Secondary malignant neoplasm of other parts of nervous system: Secondary | ICD-10-CM

## 2019-04-10 DIAGNOSIS — Z7901 Long term (current) use of anticoagulants: Secondary | ICD-10-CM

## 2019-04-10 MED ORDER — DIPHENHYDRAMINE HCL 25 MG PO CAPS: 25.0000 mg | ORAL_CAPSULE | Freq: Three times a day (TID) | ORAL | Status: DC | PRN

## 2019-04-10 MED ORDER — DEXAMETHASONE 4 MG PO TABS
2.0000 mg | ORAL_TABLET | Freq: Three times a day (TID) | ORAL | Status: DC
  Administered 2019-04-10 – 2019-04-13 (×9): 2 mg via ORAL
  Filled 2019-04-10 (×9): qty 1

## 2019-04-10 MED ORDER — ENSURE ENLIVE PO LIQD
237.0000 mL | Freq: Two times a day (BID) | ORAL | Status: DC
  Administered 2019-04-10 – 2019-04-12 (×4): 237 mL via ORAL

## 2019-04-10 MED ORDER — DEXAMETHASONE 0.5 MG PO TABS
1.0000 mg | ORAL_TABLET | Freq: Three times a day (TID) | ORAL | Status: DC
  Filled 2019-04-10 (×2): qty 2

## 2019-04-10 MED ORDER — ZOLEDRONIC ACID 4 MG/5ML IV CONC
4.0000 mg | Freq: Once | INTRAVENOUS | Status: AC
  Administered 2019-04-10: 4 mg via INTRAVENOUS
  Filled 2019-04-10: qty 5

## 2019-04-10 NOTE — Progress Notes (Signed)
IR consulted for Port-A-Cath placement. Patient is on therapeutic doses of lovenox for acute DVT.   Spoke with Dr. Antonieta Pert office.  Hold on Port-A-Cath placement for now.   Brynda Greathouse, MS RD PA-C

## 2019-04-10 NOTE — Progress Notes (Signed)
I faxed [facility] paperwork to [phone].

## 2019-04-10 NOTE — Progress Notes (Signed)
Inpatient Rehabilitation-Admissions Coordinator   I have received insurance approval for CIR. However, we do not have a bed available for this patient today. Will continue to follow for possible admit to IP Rehab and will update once a bed becomes available.   Jhonnie Garner, OTR/L  Rehab Admissions Coordinator  [phone] 04/10/2019 11:15 AM

## 2019-04-10 NOTE — Progress Notes (Addendum)
   Providing Compassionate, Quality Care - Together   Subjective: Patient reports no issues overnight. Her husband is at the bedside. She feels like she is starting to get stronger on her right side. She spoke with Dr. Marin Olp this morning and Ashlyn of radiation oncology yesterday afternoon. She understands her diagnosis and the recommended treatment plan.  Objective: Vital signs in last 24 hours: Temp:  [97.6 F (36.4 C)-98.4 F (36.9 C)] 98.4 F (36.9 C) (12/16 0802) Pulse Rate:  [56-68] 58 (12/16 0802) Resp:  [11-21] 16 (12/16 0802) BP: (126-163)/(75-88) 143/86 (12/16 0802) SpO2:  [97 %-100 %] 100 % (12/16 0802)  Intake/Output from previous day: 12/15 0701 - 12/16 0700 In: 600 [P.O.:600] Out: -  Intake/Output this shift: No intake/output data recorded.  Alert and oriented x 4 PERRLA CN II-XII grossly intact Speech fluent Motor examination: 2/5 right upper extremity 2-3/5 right lower extremity left upper and left lower extremity 5/5 Sensation intact Incision is clean, dry, and intact; staples in place  Lab Results: Recent Labs    04/09/19 0629  WBC 15.8*  HGB 15.1*  HCT 44.9  PLT 359   BMET Recent Labs    04/09/19 0629  NA 138  K 4.1  CL 105  CO2 22  GLUCOSE 99  BUN 18  CREATININE 0.52  CALCIUM 8.7*    Studies/Results: NM Bone Scan Whole Body  Result Date: 04/09/2019 CLINICAL DATA:  Non-small-cell lung cancer. Severe left shoulder pain. EXAM: NUCLEAR MEDICINE WHOLE BODY BONE SCAN TECHNIQUE: Whole body anterior and posterior images were obtained approximately 3 hours after intravenous injection of radiopharmaceutical. RADIOPHARMACEUTICALS:  21.3 mCi Technetium-61m MDP IV COMPARISON:  CT 04/02/2019.  Chest x-ray 04/01/2019. FINDINGS: Bilateral renal function and excretion. Focal areas of increased activity noted over multiple ribs bilaterally, L3 vertebral body, and left acetabular region. These findings are consistent with metastatic disease. No focal  abnormalities noted about the left shoulder. IMPRESSION: Findings consistent with bony metastatic disease as above. Left shoulder region is unremarkable. Electronically Signed   By: Marcello Moores  Register   On: 04/09/2019 11:21    Assessment/Plan: Patient is four days status post left frontal craniotomy for tumor resection. The pathology came back as adenocarcinoma. Venous dopplers of her lower extremities on 04/08/2019 revealed a right-sided femoral vein DVT. She is currently being treated with Lovenox and will be maintained on this as opposed to transitioning to Eliquis per oncology's recommendation.   LOS: 9 days    -CIR admission pending insurance approval -Decadron order changed to begin titration off of medication -Spoke with Interventional Radiology regarding Port-a-Cath placement. Per Dr. Marin Olp, this is not urgent, but will be needed in the next three weeks for treatment. -Continue to mobilize   Viona [name], DNP, AGNP-C Nurse Practitioner  [facility] [place]. [address], [place], [place] [address] P: [phone]    F: [phone]  04/10/2019, 10:57 AM

## 2019-04-10 NOTE — Progress Notes (Signed)
Initial Nutrition Assessment  DOCUMENTATION CODES:   Not applicable  INTERVENTION:  Provide Ensure Enlive po BID, each supplement provides 350 kcal and 20 grams of protein.  Encourage adequate PO intake.   NUTRITION DIAGNOSIS:   Increased nutrient needs related to chronic illness(cancer) as evidenced by estimated needs.  GOAL:   Patient will meet greater than or equal to 90% of their needs  MONITOR:   PO intake, Supplement acceptance, Skin, Weight trends, Labs, I & O's  REASON FOR ASSESSMENT:   Consult Assessment of nutrition requirement/status  ASSESSMENT:   63 y.o. female with history of HTN, OA who  otherwise in good health who started developing right sided weakness with foot drag and difficulty lifting her arm as well as left sided chest pain under her left breast for about 2-3 weeks prior to showing up for evaluation in ED on 04/01/19. CT head done revealing large area of vasogenic edema in left frontal lobe concerning for metastatic disease. MRI brain for work up and this revealed presumed solitary left frontal lobe mass with moderate vasogenic edema and extensive small vessel disease. Pathology revealed adenocarcinoma.  PROCEDURE (12/11):   LEFT FRONTAL CRANIOTOMY TUMOR EXCISION w/Brain Lab (Left) - LEFT FRONTAL CRANIOTOMY TUMOR EXCISION   Meal completion has been varied from 20-75%. Pt reports having a good appetite with no difficulties. Pt reports usually consuming at least 2 meals a day at home and ensure to have a high protein diet. Pt with no significant weight loss per weight records. RD to order nutritional supplements to aid in caloric and protein needs. Pt encouraged to eat her food at meals and to drink her supplements.   NUTRITION - FOCUSED PHYSICAL EXAM:    Most Recent Value  Orbital Region  Unable to assess  Upper Arm Region  No depletion  Thoracic and Lumbar Region  No depletion  Buccal Region  Unable to assess  Temple Region  Unable to assess   Clavicle Bone Region  Moderate depletion  Clavicle and Acromion Bone Region  Moderate depletion  Scapular Bone Region  Unable to assess  Dorsal Hand  Unable to assess  Patellar Region  No depletion  Anterior Thigh Region  No depletion  Posterior Calf Region  No depletion  Edema (RD Assessment)  Mild  Hair  Reviewed  Eyes  Reviewed  Mouth  Reviewed  Skin  Reviewed  Nails  Reviewed      Labs and medications reviewed.   Diet Order:   Diet Order            Diet regular Room service appropriate? Yes; Fluid consistency: Thin  Diet effective now              EDUCATION NEEDS:   Not appropriate for education at this time  Skin:  Skin Assessment: Skin Integrity Issues: Skin Integrity Issues:: Incisions Incisions: head  Last BM:  12/10  Height:   Ht Readings from Last 1 Encounters:  04/05/19 5\' 7"  (1.702 m)    Weight:   Wt Readings from Last 1 Encounters:  04/05/19 60.8 kg    Ideal Body Weight:  61.36 kg  BMI:  Body mass index is 20.99 kg/m.  Estimated Nutritional Needs:   Kcal:  4081-4481  Protein:  85-95 grams  Fluid:  >/= 1.7 L/day    Corrin Parker, MS, RD, LDN Pager # [phone] After hours/ weekend pager # [phone]

## 2019-04-10 NOTE — Progress Notes (Signed)
Physical Therapy Treatment Patient Details Name: [name] MRN: [id number] DOB: 1955/10/09 Today's Date: 04/10/2019    History of Present Illness 63 yo female admitted to ED on 12/7 with RLE and RUE weakness and altered sensation over the past 2-3 weeks. CT head revealed L frontal lobe edema with mass, s/p L frontal craniotomy with tumor resection on 12/11. Pt with suspected SMA irritation post-operatively with increased weakness RUE/LE. Per oncology, pt with suspected stage IV bronchogenic carcinoma with mets to bilateral lungs, L3 vertebral body, and L thoracic node. Hospital course complicatied by RLE DVT, now therapeutic to see. PMH includes HTN, tobacco use, hip stress fracture 1 year ago with recurrence 1 month ago. 04/09/19 bone scan showed metastases: multiple ribs bilaterally, L3 vertebral body, and left acetabulum     PT Comments    Pt motivated to participate in PT this session. Pt with improved gait training today, requiring min-mod verbal cuing for weight shifting and PT still giving max RLE blocking due to buckling in WB. Pt tolerated 3x5 ft gait training with mod assist from PT with standby assist from husband in room. Pt complaining of continued R calf pain, pt instructed and guided through gentle heel cord stretching with gait belt. Pt continues to be a strong candidate for CIR, will continue to follow acutely.    Follow Up Recommendations  CIR     Equipment Recommendations  Other (comment)(defer to next venue)    Recommendations for Other Services       Precautions / Restrictions Precautions Precautions: Fall Precaution Comments: craniotomy; pathologic fracture L3 Restrictions Weight Bearing Restrictions: No    Mobility  Bed Mobility Overal bed mobility: Needs Assistance Bed Mobility: Supine to Sit     Supine to sit: Min assist     General bed mobility comments: min assist for RLE and RUE management, pt with increased time to scoot to EOB but no physical  assist needed.  Transfers Overall transfer level: Needs assistance Equipment used: 1 person hand held assist Transfers: Sit to/from Stand Sit to Stand: Mod assist;+2 safety/equipment;Min assist         General transfer comment: Sit to stand x4 during session due to seated rest breaks during gait training. Initially mod assist for power up, steadying, and RLE blocking, required min assist with continued repetitions.  Ambulation/Gait Ambulation/Gait assistance: Mod assist;+2 safety/equipment Gait Distance (Feet): 5 Feet(3x5 ft) Assistive device: 1 person hand held assist Gait Pattern/deviations: Step-to pattern;Decreased step length - right;Drifts right/left;Narrow base of support;Decreased weight shift to right;Step-through pattern;Decreased stance time - right Gait velocity: decr   General Gait Details: mod assist for trunk support, physical assist with weight shift L and R, RLE blocking with RLE stance phase, and RUE support in standing.3 bouts of 5 ft ambulation, seated rest break x2 minutes between each round ambulation.   Stairs             Wheelchair Mobility    Modified Rankin (Stroke Patients Only)       Balance Overall balance assessment: Needs assistance Sitting-balance support: Feet supported;No upper extremity supported Sitting balance-Leahy Scale: Fair Sitting balance - Comments: able to sit EOC without imbalance   Standing balance support: Single extremity supported Standing balance-Leahy Scale: Poor Standing balance comment: reliant on external support in standing                            Cognition Arousal/Alertness: Awake/alert Behavior During Therapy: WFL for tasks assessed/performed Overall  Cognitive Status: Within Functional Limits for tasks assessed                                 General Comments: Pt states she had an emotional morning, but feels better now      Exercises Other Exercises Other Exercises: R heel  cord stretch with gait belt loop, 3x30 seconds, gentle stretching emphasized. 10* PF was maximum stretch    General Comments        Pertinent Vitals/Pain Pain Assessment: Faces Faces Pain Scale: Hurts little more Pain Location: Rt calf Pain Descriptors / Indicators: Sore;Discomfort Pain Intervention(s): Limited activity within patient's tolerance;Monitored during session;Repositioned;Patient requesting pain meds-RN notified    Home Living                      Prior Function            PT Goals (current goals can now be found in the care plan section) Acute Rehab PT Goals Patient Stated Goal: get back to my independence PT Goal Formulation: With patient/family Time For Goal Achievement: 04/22/19 Potential to Achieve Goals: Good Progress towards PT goals: Progressing toward goals    Frequency    Min 4X/week      PT Plan Current plan remains appropriate    Co-evaluation              AM-PAC PT "6 Clicks" Mobility   Outcome Measure  Help needed turning from your back to your side while in a flat bed without using bedrails?: A Little Help needed moving from lying on your back to sitting on the side of a flat bed without using bedrails?: A Little Help needed moving to and from a bed to a chair (including a wheelchair)?: A Lot Help needed standing up from a chair using your arms (e.g., wheelchair or bedside chair)?: A Lot Help needed to walk in hospital room?: A Lot Help needed climbing 3-5 steps with a railing? : Total 6 Click Score: 13    End of Session Equipment Utilized During Treatment: Gait belt Activity Tolerance: Patient limited by fatigue;Patient limited by pain Patient left: with call bell/phone within reach;in bed;with bed alarm set Nurse Communication: Mobility status;Patient requests pain meds PT Visit Diagnosis: Other symptoms and signs involving the nervous system (R29.898);Muscle weakness (generalized) (M62.81);Difficulty in walking, not  elsewhere classified (R26.2)     Time: 6269-4854 PT Time Calculation (min) (ACUTE ONLY): 33 min  Charges:  $Gait Training: 23-37 mins                    Thurlow Gallaga E, PT [place] Pager [phone]  Office [phone]  Massimo Hartland D [place] 04/10/2019, 1:56 PM

## 2019-04-10 NOTE — Progress Notes (Signed)
Ms. Mcphee is doing okay.  She is getting a lot of aggressive therapy with occupational therapy and physical therapy.  Hopefully, she will go to inpatient rehab.  Her bone scan does show some bony metastasis.  There is nothing in the left shoulder that would explain the discomfort.  I cannot imagine this is any counter referred pain.  I will go ahead and give her dose of Zometa.  She is on Lovenox for the thromboembolic event in the right leg.  I would keep her on Lovenox for long-term.  I will have to speak with radiation therapy to see about doing cranial radiation.  Maybe they can do stereotactic radiation therapy.  We did send off the specimen for genetic analysis.  This will take 7-10 days before we get the results back.  She sounds like she is eating okay.  We really need to optimize her nutritional input.  Probably would not be a bad idea to get nutritional service and to optimize her caloric intake.  She is on Decadron.  Maybe we can cut this back a little bit.  On her exam, her temperature is 97.6.  Pulse 60.  Blood pressure 130/80.  Her oral exam does not show any thrush.  Her lungs are relatively clear bilaterally.  Cardiac exam regular rate and rhythm with normal S1 and S2.  Abdomen is soft.  Her extremities this still shows weakness in the right arm and leg.  Ms. Broyles has metastatic adenocarcinoma of the lung.  She has a solitary brain met that was resected.  She will now need postop radiation therapy to the brain.  Hopefully she will go to [facility] soon.  We might want to consider a Port-A-Cath for her while she is in the hospital.  I think would be highly unlikely that we would find a molecular marker that we can target.  Typically that happens in 5% of patients with lung adenocarcinoma.  I know that she is getting fantastic care by all the staff up [address].  As always, we had an excellent prayer session.  She does have a strong faith.  Lattie Haw,  MD  Nehemiah 1:5

## 2019-04-11 ENCOUNTER — Encounter: Payer: Self-pay | Admitting: *Deleted

## 2019-04-11 ENCOUNTER — Telehealth: Payer: Self-pay | Admitting: Radiation Oncology

## 2019-04-11 DIAGNOSIS — C3491 Malignant neoplasm of unspecified part of right bronchus or lung: Secondary | ICD-10-CM | POA: Insufficient documentation

## 2019-04-11 MED ORDER — ENOXAPARIN SODIUM 60 MG/0.6ML ~~LOC~~ SOLN: 1.0000 mg/kg | Freq: Two times a day (BID) | SUBCUTANEOUS | 0 refills | Status: DC

## 2019-04-11 MED ORDER — ONDANSETRON HCL 4 MG/2ML IJ SOLN: 4.0000 mg | INTRAMUSCULAR | 0 refills | Status: DC | PRN

## 2019-04-11 MED ORDER — ACETAMINOPHEN 650 MG RE SUPP: 650.0000 mg | RECTAL | 0 refills | Status: DC | PRN

## 2019-04-11 MED ORDER — LEVETIRACETAM 500 MG PO TABS: 500.0000 mg | ORAL_TABLET | Freq: Two times a day (BID) | ORAL | 0 refills | Status: DC

## 2019-04-11 MED ORDER — DEXAMETHASONE 2 MG PO TABS: 2.0000 mg | ORAL_TABLET | Freq: Three times a day (TID) | ORAL | 0 refills | Status: DC

## 2019-04-11 MED ORDER — DEXAMETHASONE 1 MG PO TABS: 1.0000 mg | ORAL_TABLET | Freq: Three times a day (TID) | ORAL | 0 refills | Status: DC

## 2019-04-11 MED ORDER — KETOROLAC TROMETHAMINE 30 MG/ML IJ SOLN: 30.0000 mg | Freq: Four times a day (QID) | INTRAMUSCULAR | 0 refills | Status: DC

## 2019-04-11 MED ORDER — NICOTINE 7 MG/24HR TD PT24: 7.0000 mg | MEDICATED_PATCH | Freq: Every day | TRANSDERMAL | 0 refills | Status: DC

## 2019-04-11 MED ORDER — PROMETHAZINE HCL 12.5 MG PO TABS: 12.5000 mg | ORAL_TABLET | ORAL | 0 refills | Status: DC | PRN

## 2019-04-11 MED ORDER — HYDROCODONE-ACETAMINOPHEN 5-325 MG PO TABS: 1.0000 | ORAL_TABLET | ORAL | 0 refills | Status: DC | PRN

## 2019-04-11 MED ORDER — KETOROLAC TROMETHAMINE 30 MG/ML IJ SOLN
30.0000 mg | Freq: Four times a day (QID) | INTRAMUSCULAR | Status: DC
  Administered 2019-04-11 – 2019-04-12 (×4): 30 mg via INTRAVENOUS
  Filled 2019-04-11 (×5): qty 1

## 2019-04-11 MED ORDER — ACETAMINOPHEN 325 MG PO TABS: 650.0000 mg | ORAL_TABLET | ORAL | 0 refills | Status: DC | PRN

## 2019-04-11 MED ORDER — ENSURE ENLIVE PO LIQD: 237.0000 mL | Freq: Two times a day (BID) | ORAL | 12 refills | Status: AC

## 2019-04-11 MED ORDER — ONDANSETRON HCL 4 MG PO TABS: 4.0000 mg | ORAL_TABLET | ORAL | 0 refills | Status: DC | PRN

## 2019-04-11 MED ORDER — DIPHENHYDRAMINE HCL 25 MG PO CAPS: 25.0000 mg | ORAL_CAPSULE | Freq: Three times a day (TID) | ORAL | 0 refills | Status: DC | PRN

## 2019-04-11 NOTE — Progress Notes (Signed)
Inpatient Rehabilitation-Admissions Coordinator   I have received insurance approval and medical clearance from attending service. I have a bed available for this patient and will admit her to CIR today.  I have alerted RN and will contact TOC team regarding plan for admit today. I will review insurance benefits letter and consent forms with pt once her husband arrives, per her request.   Please call if questions.   Jhonnie Garner, OTR/L  Rehab Admissions Coordinator  [phone] 04/11/2019 10:01 AM

## 2019-04-11 NOTE — Telephone Encounter (Signed)
After playing telephone tag this RN eventually managed to catch up with Claiborne Billings, admissions coordinator for inpatient rehab. Patient expressed the intentions of transferring the patient into rehab in the next 2-3 days. Reviewed all upcoming patient appointments. Claiborne Billings verbalized understanding and confirmed this is helpful for continuity of care.

## 2019-04-11 NOTE — Progress Notes (Signed)
I spoke with the patient in her husband regarding concerns about the pain with her DVTs. He had concerns regarding the blood clot traveling from her lower extremity. I explained that this would not be likely as she is on a therapeutic dose of Lovenox. I am adding IV Toradol to see if we can get her pain under better control. She and her husband are hopeful she will feel up to transferring to CIR tomorrow if a bed is still available.  Viona [name], DNP, AGNP-C Nurse Practitioner  [facility] [place] [address], [address], [place], [place] [address] P: [phone]    F: [phone]  04/11/2019 5:58 PM

## 2019-04-11 NOTE — Progress Notes (Signed)
Inpatient Rehabilitation-Admissions Coordinator   Was notified by PM&R PA that pt was having increased pain in R calf. Went to pt bedside with pt receiving IV pain meds and crying out in pain. Pt's husband is adamant that he does not want the patient to transition to CIR today and would like to speak with her physician regarding the pain. I have contacted attending service and await a call back. RN bedside and aware.   Pt will not admit to CIR today.   Raechel Ache, OTR/L  Rehab Admissions Coordinator  [phone] 04/11/2019 3:36 PM

## 2019-04-11 NOTE — Discharge Summary (Addendum)
Physician Discharge Summary  Patient ID: [name] MRN: [id number] DOB/AGE: Apr 10, 1956 63 y.o.  Admit date: 04/01/2019 Discharge date: 04/11/2019  Admission Diagnoses: Metastatic cancer to the brain  Discharge Diagnoses:  Principal Problem:   Metastatic cancer to brain [facility]) Active Problems:   Brain mass   Discharged Condition: good  Hospital Course: Patient presented to the emergency department with a one week history of significant right-sided weakness on 04/02/2019. She had been dragging her right foot when walking and having difficulty lifting her right arm. She underwent a left frontal craniotomy for tumor resection on 04/05/2019. The pathology came back as adenocarcinoma. Oncology was consulted and are coordinating treatment for her lung cancer with metastases. On 04/08/2019, venous dopplers revealed right femoral DVT. She is on Lovenox for anticoagulation. She has been working for therapies who are recommending CIR at discharge. She is medically ready at this point to discharge to CIR.  Consults: rehabilitation medicine and oncology  Significant Diagnostic Studies: radiology: CT HEAD WO CONTRAST  Result Date: 04/01/2019 CLINICAL DATA:  Onset decreased movement in the right upper and lower extremities 1 week ago. Altered handwriting. EXAM: CT HEAD WITHOUT CONTRAST TECHNIQUE: Contiguous axial images were obtained from the base of the skull through the vertex without intravenous contrast. COMPARISON:  None. FINDINGS: Brain: There is a large area vasogenic edema in the high left frontal lobe. Hypoattenuation is seen in the basal ganglia bilaterally. No hemorrhage, midline shift or evidence of acute infarct is identified. Vascular: Atherosclerosis noted.  No hyperdense vessel. Skull: Intact.  No focal lesion. Sinuses/Orbits: Negative. Other: None. IMPRESSION: Large area vasogenic edema in the high left frontal lobe highly suspicious for neoplasm, likely metastatic disease. Brain MRI with  and without contrast is recommended for further evaluation. Chest x-ray is also recommended. Hypoattenuation in the basal ganglia bilaterally is likely due to prior lacunar infarctions. These results were called by telephone at the time of interpretation on 04/01/2019 at 3:52 pm to provider [facility] , who verbally acknowledged these results. Electronically Signed   By: Inge Rise M.D.   On: 04/01/2019 15:56   CT CHEST W CONTRAST  Result Date: 04/02/2019 CLINICAL DATA:  Cancer screening EXAM: CT CHEST, ABDOMEN, AND PELVIS WITH CONTRAST TECHNIQUE: Multidetector CT imaging of the chest, abdomen and pelvis was performed following the standard protocol during bolus administration of intravenous contrast. CONTRAST:  168mL OMNIPAQUE IOHEXOL 300 MG/ML  SOLN COMPARISON:  Chest radiograph dated 04/01/2019 FINDINGS: CT CHEST FINDINGS Cardiovascular: The heart is normal in size. No pericardial effusion. No evidence of thoracic aortic aneurysm. Atherosclerotic calcifications of the aortic arch. Three vessel coronary atherosclerosis. Mediastinum/Nodes: Thoracic lymphadenopathy, including: --10 mm short axis left supraclavicular node (series 3/image 12) --3.3 cm short axis high right paratracheal node (series 3/image 18) --2.1 cm short axis anterior mediastinal node (series 3/image 20) --2.9 cm short axis low right paratracheal node (series 3/image 23) --1.7 cm short axis mid parasymphyseal node (series 3/image 32) Visualized thyroid is unremarkable. Lungs/Pleura: 3.0 x 2.4 x 3.6 cm spiculated mass in the medial right upper lobe (series 5/image 38), compatible with primary bronchogenic neoplasm. Additional bilateral pulmonary metastases in the left upper lobe and bilateral lower lobes, approximately 20-25 in number, including: --17 mm left upper lobe nodule (series 5/image 69) --15 mm right upper lobe nodule (series 5/image 75) --16 mm medial left lower lobe nodule (series 5/image 102) Mild centrilobular and  paraseptal emphysematous changes, upper lung predominant. No focal consolidation. No pleural effusion or pneumothorax. Musculoskeletal: Subtle sclerosis involving the  superior endplate of I09 and B35 (sagittal images 82 and 86), nonspecific. CT ABDOMEN PELVIS FINDINGS Hepatobiliary: 8 mm cyst in segment 8 (series 3/image 51). Additional 7 mm hypoenhancing lesion in segment 8 (series 3/image 84), indeterminate. 15 mm lesion at the junction of segment 5/6 (series 3/image 89), with suspected peripheral nodular discontinuous enhancement, suggesting a benign hemangioma. Technically, this remains incompletely characterized. Gallbladder is unremarkable. No intrahepatic or extrahepatic ductal dilatation. Pancreas: Within normal limits. Spleen: Within normal limits. Adrenals/Urinary Tract: Mild thickening of the left adrenal gland, without discrete mass. Right adrenal gland is within normal limits. 8 mm medial left upper pole renal cyst (series 8/image 7). Right kidney is within normal limits. No hydronephrosis. Bladder is within normal limits. Stomach/Bowel: Stomach is within normal limits. No evidence of bowel obstruction. Appendix is not discretely visualized. Vascular/Lymphatic: No evidence of abdominal aortic aneurysm. Atherosclerotic calcifications of the abdominal aorta and branch vessels. No suspicious abdominopelvic lymphadenopathy. Reproductive: Uterine fibroids. Bilateral ovaries are within normal limits. Other: No abdominopelvic ascites. Musculoskeletal: Destructive lytic metastasis along the anterior aspect of the L3 vertebral body (sagittal image 77). IMPRESSION: 3.6 cm spiculated mass in the medial right upper lobe, compatible with primary bronchogenic neoplasm. Bilateral pulmonary metastases. Associated thoracic nodal metastases, including a 10 mm short axis left supraclavicular nodal metastasis which may be amenable to tissue sampling/resection. Destructive lytic osseous metastasis along the anterior aspect  of the L3 vertebral body. Electronically Signed   By: Julian Hy M.D.   On: 04/02/2019 12:18   MR BRAIN W WO CONTRAST  Result Date: 04/06/2019 CLINICAL DATA:  Status post tumor resection EXAM: MRI HEAD WITHOUT AND WITH CONTRAST TECHNIQUE: Multiplanar, multiecho pulse sequences of the brain and surrounding structures were obtained without and with intravenous contrast. CONTRAST:  71mL GADAVIST GADOBUTROL 1 MMOL/ML IV SOLN COMPARISON:  Brain MRI 04/04/2019 FINDINGS: Brain: Status post resection of contrast-enhancing lesion in the superior left frontal lobe. There is a small resection cavity without residual contrast enhancement. The degree of surrounding hyperintense T2-weighted signal is unchanged. There is an area of diffusion restriction inferior to the resection site, likely indicating some acute ischemia. There is near confluent white matter hyperintensity within both hemispheres. Vascular: Flow voids are preserved Skull and upper cervical spine: Status post left frontal craniotomy. Sinuses/Orbits: Negative Other: None IMPRESSION: 1. Status post resection of superior left frontal lobe mass with unchanged degree of surrounding hyperintense T2-weighted signal. No residual contrast enhancement. 2. Small area of diffusion restriction inferior to the resection site, likely indicating some acute ischemia. Electronically Signed   By: Ulyses Jarred M.D.   On: 04/06/2019 05:56   MR BRAIN W WO CONTRAST  Addendum Date: 04/05/2019   ADDENDUM REPORT: 04/05/2019 09:29 ADDENDUM: FA map imaging with successfully created today. The extensive edema in the region of the left vertex tumor obscures fiber tract evaluation on the left. However, using the right-side for mirror image evaluation, the lesion is in fact in the motor strip. Electronically Signed   By: Nelson Chimes M.D.   On: 04/05/2019 09:29   Result Date: 04/05/2019 CLINICAL DATA:  Right-sided weakness. Solitary metastasis posterior left frontal lobe.  BrainLAB protocol EXAM: MRI HEAD WITHOUT AND WITH CONTRAST TECHNIQUE: Multiplanar, multiecho pulse sequences of the brain and surrounding structures were obtained without and with intravenous contrast. CONTRAST:  6.52mL GADAVIST GADOBUTROL 1 MMOL/ML IV SOLN COMPARISON:  None. FINDINGS: Brain: BrainLAB protocol is done for operative utilization. No change since yesterday's exam with respect to the extent of disease. 1.2 cm  height left posterior frontal metastatic lesion with moderate surrounding vasogenic edema. Chronic small-vessel ischemic changes elsewhere throughout the cerebral hemispheric white matter. Old lacunar infarctions in the basal ganglia. Chronic small-vessel changes of the brainstem. Scattered punctate foci of hemosiderin deposition related to old small vessel infarctions. Vascular: Major vessels at the base of the brain show flow. Skull and upper cervical spine: Negative Sinuses/Orbits: Clear/normal Other: None IMPRESSION: BrainLAB protocol for operative utilization. 1.2 cm solitary left posterior frontal metastasis with moderate vasogenic edema. Any post processing is deferred to standard staffing hours. Electronically Signed: By: Nelson Chimes M.D. On: 04/04/2019 19:06   MR BRAIN W WO CONTRAST  Result Date: 04/03/2019 CLINICAL DATA:  Metastatic neoplasm suspected. Right-sided hemiparesis EXAM: MRI HEAD WITHOUT AND WITH CONTRAST TECHNIQUE: Multiplanar, multiecho pulse sequences of the brain and surrounding structures were obtained without and with intravenous contrast. CONTRAST:  10mL GADAVIST GADOBUTROL 1 MMOL/ML IV SOLN COMPARISON:  Head CT from 2 days ago FINDINGS: Brain: 1.2 Cm high left posterior frontal enhancing mass along the cortex with moderate vasogenic edema. Chronic small vessel ischemia throughout the cerebral white matter with remote lacunar infarcts in the bilateral basal ganglia and brainstem. Scattered chronic blood products could be related to small vessel disease but are fairly  peripheral. No acute infarct, acute hemorrhage, hydrocephalus, or collection. Vascular: Normal flow voids and vascular enhancements Skull and upper cervical spine: Normal marrow signal. Sinuses/Orbits: Negative IMPRESSION: 1. Solitary presumed metastasis in the posterior left frontal lobe with moderate vasogenic edema. 2. Extensive chronic small vessel ischemia. Electronically Signed   By: Monte Fantasia M.D.   On: 04/03/2019 06:48   NM Bone Scan Whole Body  Result Date: 04/09/2019 CLINICAL DATA:  Non-small-cell lung cancer. Severe left shoulder pain. EXAM: NUCLEAR MEDICINE WHOLE BODY BONE SCAN TECHNIQUE: Whole body anterior and posterior images were obtained approximately 3 hours after intravenous injection of radiopharmaceutical. RADIOPHARMACEUTICALS:  21.3 mCi Technetium-27m MDP IV COMPARISON:  CT 04/02/2019.  Chest x-ray 04/01/2019. FINDINGS: Bilateral renal function and excretion. Focal areas of increased activity noted over multiple ribs bilaterally, L3 vertebral body, and left acetabular region. These findings are consistent with metastatic disease. No focal abnormalities noted about the left shoulder. IMPRESSION: Findings consistent with bony metastatic disease as above. Left shoulder region is unremarkable. Electronically Signed   By: Marcello Moores  Register   On: 04/09/2019 11:21   CT ABDOMEN PELVIS W CONTRAST  Result Date: 04/02/2019 CLINICAL DATA:  Cancer screening EXAM: CT CHEST, ABDOMEN, AND PELVIS WITH CONTRAST TECHNIQUE: Multidetector CT imaging of the chest, abdomen and pelvis was performed following the standard protocol during bolus administration of intravenous contrast. CONTRAST:  160mL OMNIPAQUE IOHEXOL 300 MG/ML  SOLN COMPARISON:  Chest radiograph dated 04/01/2019 FINDINGS: CT CHEST FINDINGS Cardiovascular: The heart is normal in size. No pericardial effusion. No evidence of thoracic aortic aneurysm. Atherosclerotic calcifications of the aortic arch. Three vessel coronary atherosclerosis.  Mediastinum/Nodes: Thoracic lymphadenopathy, including: --10 mm short axis left supraclavicular node (series 3/image 12) --3.3 cm short axis high right paratracheal node (series 3/image 18) --2.1 cm short axis anterior mediastinal node (series 3/image 20) --2.9 cm short axis low right paratracheal node (series 3/image 23) --1.7 cm short axis mid parasymphyseal node (series 3/image 32) Visualized thyroid is unremarkable. Lungs/Pleura: 3.0 x 2.4 x 3.6 cm spiculated mass in the medial right upper lobe (series 5/image 38), compatible with primary bronchogenic neoplasm. Additional bilateral pulmonary metastases in the left upper lobe and bilateral lower lobes, approximately 20-25 in number, including: --17 mm left upper  lobe nodule (series 5/image 69) --15 mm right upper lobe nodule (series 5/image 75) --16 mm medial left lower lobe nodule (series 5/image 102) Mild centrilobular and paraseptal emphysematous changes, upper lung predominant. No focal consolidation. No pleural effusion or pneumothorax. Musculoskeletal: Subtle sclerosis involving the superior endplate of C12 and X51 (sagittal images 82 and 86), nonspecific. CT ABDOMEN PELVIS FINDINGS Hepatobiliary: 8 mm cyst in segment 8 (series 3/image 51). Additional 7 mm hypoenhancing lesion in segment 8 (series 3/image 84), indeterminate. 15 mm lesion at the junction of segment 5/6 (series 3/image 89), with suspected peripheral nodular discontinuous enhancement, suggesting a benign hemangioma. Technically, this remains incompletely characterized. Gallbladder is unremarkable. No intrahepatic or extrahepatic ductal dilatation. Pancreas: Within normal limits. Spleen: Within normal limits. Adrenals/Urinary Tract: Mild thickening of the left adrenal gland, without discrete mass. Right adrenal gland is within normal limits. 8 mm medial left upper pole renal cyst (series 8/image 7). Right kidney is within normal limits. No hydronephrosis. Bladder is within normal limits.  Stomach/Bowel: Stomach is within normal limits. No evidence of bowel obstruction. Appendix is not discretely visualized. Vascular/Lymphatic: No evidence of abdominal aortic aneurysm. Atherosclerotic calcifications of the abdominal aorta and branch vessels. No suspicious abdominopelvic lymphadenopathy. Reproductive: Uterine fibroids. Bilateral ovaries are within normal limits. Other: No abdominopelvic ascites. Musculoskeletal: Destructive lytic metastasis along the anterior aspect of the L3 vertebral body (sagittal image 77). IMPRESSION: 3.6 cm spiculated mass in the medial right upper lobe, compatible with primary bronchogenic neoplasm. Bilateral pulmonary metastases. Associated thoracic nodal metastases, including a 10 mm short axis left supraclavicular nodal metastasis which may be amenable to tissue sampling/resection. Destructive lytic osseous metastasis along the anterior aspect of the L3 vertebral body. Electronically Signed   By: Julian Hy M.D.   On: 04/02/2019 12:18   DG Chest Port 1 View  Result Date: 04/01/2019 CLINICAL DATA:  Decreased right upper extremity and lower extremity movement EXAM: PORTABLE CHEST 1 VIEW COMPARISON:  2014 FINDINGS: Chronic interstitial prominence. Patchy perihilar densities. No pleural effusion or pneumothorax. Normal heart size. Increased right paratracheal density. IMPRESSION: Mild patchy perihilar atelectasis/consolidation. Increased right paratracheal density, which could reflect mediastinal soft tissue. Electronically Signed   By: Macy Mis M.D.   On: 04/01/2019 16:32   VAS [place] LOWER EXTREMITY VENOUS (DVT)  Result Date: 04/09/2019  Lower Venous Study Indications: Swelling, and Pain.  Risk Factors: Surgery left craniotomy tumor on 04/05/19. Comparison Study: No priors. Performing Technologist: Oda Cogan RDMS, RVT  Examination Guidelines: A complete evaluation includes B-mode imaging, spectral Doppler, color Doppler, and power Doppler as needed of  all accessible portions of each vessel. Bilateral testing is considered an integral part of a complete examination. Limited examinations for reoccurring indications may be performed as noted.  +---------+---------------+---------+-----------+----------+-----------------+ RIGHT    CompressibilityPhasicitySpontaneityPropertiesThrombus Aging    +---------+---------------+---------+-----------+----------+-----------------+ CFV      Full           Yes      Yes                                    +---------+---------------+---------+-----------+----------+-----------------+ SFJ      Full                                                           +---------+---------------+---------+-----------+----------+-----------------+  FV Prox  None                                         Age Indeterminate +---------+---------------+---------+-----------+----------+-----------------+ FV Mid   None                                         Age Indeterminate +---------+---------------+---------+-----------+----------+-----------------+ FV DistalNone                                         Age Indeterminate +---------+---------------+---------+-----------+----------+-----------------+ PFV      Full                                         Age Indeterminate +---------+---------------+---------+-----------+----------+-----------------+ POP      None           No       No                   Age Indeterminate +---------+---------------+---------+-----------+----------+-----------------+ PTV      None                                         Age Indeterminate +---------+---------------+---------+-----------+----------+-----------------+ PERO     None                                         Age Indeterminate +---------+---------------+---------+-----------+----------+-----------------+ Gastroc  None                                                            +---------+---------------+---------+-----------+----------+-----------------+ Thrombus appears sub-acute.  +---------+---------------+---------+-----------+----------+--------------+ LEFT     CompressibilityPhasicitySpontaneityPropertiesThrombus Aging +---------+---------------+---------+-----------+----------+--------------+ CFV      Full           Yes      Yes                                 +---------+---------------+---------+-----------+----------+--------------+ SFJ      Full                                                        +---------+---------------+---------+-----------+----------+--------------+ FV Prox  Full                                                        +---------+---------------+---------+-----------+----------+--------------+ FV Mid   Full                                                        +---------+---------------+---------+-----------+----------+--------------+  FV DistalFull                                                        +---------+---------------+---------+-----------+----------+--------------+ PFV      Full                                                        +---------+---------------+---------+-----------+----------+--------------+ POP      Full           Yes      Yes                                 +---------+---------------+---------+-----------+----------+--------------+ PTV      Full                                                        +---------+---------------+---------+-----------+----------+--------------+ PERO     Full                                                        +---------+---------------+---------+-----------+----------+--------------+    Summary: Right: Findings consistent with age indeterminate deep vein thrombosis involving the right femoral vein, right popliteal vein, right posterior tibial veins, right peroneal veins, and right gastrocnemius veins. Left: There is no  evidence of deep vein thrombosis in the lower extremity.  *See table(s) above for measurements and observations. Electronically signed by Harold Barban MD on 04/09/2019 at 8:29:58 AM.    Final      Treatments: surgery: Left frontal craniotomy for tumor resection  Discharge Exam: Blood pressure 134/84, pulse 61, temperature 99 F (37.2 C), temperature source Oral, resp. rate 14, height 5\' 7"  (1.702 m), weight 60.8 kg, SpO2 97 %.   Alert and oriented x 4 PERRLA CN II-XII grossly intact Speech fluent Motor examination:2/5 right upper extremity 2-3/5 right lower extremity left upper and left lower extremity 5/5 Sensation intact Incision is clean, dry, and intact; staples in place   Disposition: Discharge disposition: [place] Not Defined        Allergies as of 04/11/2019      Reactions   Codeine    Lisinopril-hydrochlorothiazide Other (See Comments)   Back pain      Medication List    TAKE these medications   acetaminophen 650 MG suppository Commonly known as: TYLENOL Place 1 suppository (650 mg total) rectally every 4 (four) hours as needed for mild pain (temp > 100.5).   acetaminophen 325 MG tablet Commonly known as: TYLENOL Take 2 tablets (650 mg total) by mouth every 4 (four) hours as needed for mild pain or fever (temp > 100.5).   amLODipine 10 MG tablet Commonly known as: NORVASC TAKE 1 TABLET (10 MG TOTAL) BY MOUTH DAILY. NEED OFFICE VISIT What changed: See the new instructions.  aspirin 81 MG tablet Take 81 mg by mouth daily.   dexamethasone 2 MG tablet Commonly known as: DECADRON Take 1 tablet (2 mg total) by mouth every 8 (eight) hours for 2 days.   dexamethasone 1 MG tablet Commonly known as: DECADRON Take 1 tablet (1 mg total) by mouth every 8 (eight) hours for 3 days. Start taking on: April 13, 2019   diphenhydrAMINE 25 mg capsule Commonly known as: BENADRYL Take 1 capsule (25 mg total) by mouth every 8 (eight)  hours as needed for itching.   enoxaparin 60 MG/0.6ML injection Commonly known as: LOVENOX Inject 0.6 mLs (60 mg total) into the skin 2 (two) times daily.   feeding supplement (ENSURE ENLIVE) Liqd Take 237 mLs by mouth 2 (two) times daily between meals.   gabapentin 300 MG capsule Commonly known as: NEURONTIN Take 300 mg by mouth 3 (three) times daily.   hydrALAZINE 25 MG tablet Commonly known as: APRESOLINE TAKE 1 TABLET BY MOUTH TWICE A DAY   HYDROcodone-acetaminophen 5-325 MG tablet Commonly known as: NORCO/VICODIN Take 1 tablet by mouth every 4 (four) hours as needed for moderate pain.   ibuprofen 800 MG tablet Commonly known as: ADVIL Take 800 mg by mouth 3 (three) times daily as needed for moderate pain.   ketorolac 30 MG/ML injection Commonly known as: TORADOL Inject 1 mL (30 mg total) into the vein every 6 (six) hours.   levETIRAcetam 500 MG tablet Commonly known as: KEPPRA Take 1 tablet (500 mg total) by mouth 2 (two) times daily.   nicotine 7 mg/24hr patch Commonly known as: NICODERM CQ - dosed in mg/24 hr Place 1 patch (7 mg total) onto the skin daily.   ondansetron 4 MG/2ML Soln injection Commonly known as: ZOFRAN Inject 2 mLs (4 mg total) into the vein every 4 (four) hours as needed for nausea or vomiting.   ondansetron 4 MG tablet Commonly known as: ZOFRAN Take 1 tablet (4 mg total) by mouth every 4 (four) hours as needed for nausea or vomiting.   promethazine 12.5 MG tablet Commonly known as: PHENERGAN Take 1-2 tablets (12.5-25 mg total) by mouth every 4 (four) hours as needed for refractory nausea / vomiting.      Follow-up Information    Earnie Larsson, MD Follow up.   Specialty: Neurosurgery Contact information: [address] [place] [address] [place] [address] [phone]           Signed: Patricia Nettle 04/11/2019, 12:06 PM

## 2019-04-11 NOTE — Progress Notes (Signed)
As visitation is limited, Mrs. Ramanathan's husband, [name], would like providers to call with updates if he is not at the bedside. He can be reached at [phone].

## 2019-04-11 NOTE — H&P (Addendum)
Physical Medicine and Rehabilitation Admission H&P    Chief Complaint  Patient presents with  . Lung cancer with mets to the brain.     HPI:  [name] is a 63 year old female with history of HTN, OA who started developing progressive right-sided weakness with foot drop, difficulty lifting her arm, left-sided chest pain and under the breast for 2 to 3 weeks prior to being evaluated in ED on 04/01/2019.  History taken from chart review and patient.  CT head performed, showing left frontal lobe vasogenic edema concerning for metastatic disease.  Neurology recommended MRI brain, which showed solitary left frontal lobe mass with moderate vasogenic edema and extensive small vessel disease.  She was started on Keppra and IV Decadron and transferred to [facility] for further management.   CT chest, abdomen and pelvis revealed 3 x 2.4 x 3.6 cm spiculated mass in medial RUL compatible with primary bronchogenic neoplasm, additional bilateral pulmonary metastasis LUL and BLL, thoracic nodal metastases, left supraclavicular nodal metastases, hepatic cyst and lytic osseous metastases along anterior aspect of L3 vertebral body.  Dr. Marin Olp was consulted for input and has been following along.  Radiation oncology consulted for palliative treatment of left chest wall due to ongoing severe pain right lower lobe.  Neurosurgery, Dr. Trenton Gammon consulted, patient underwent left craniotomy with resection of tumor on 04/05/2019. Pathology positive for adenocarcinoma and patient to be presented at tumor board next week.  She continues to have right sided weakness with numbness and steroids being weaned off.  Hospital course further complicated by calf pain on 04/07/2019.  BLE Dopplers performed and revealed revealed indeterminate DVT involving right femoral, right gastroc, popliteal  veins.  Dr. Marin Olp recommended continuing Lovenox for now and will to NOAC on outpatient basis?  She been cleared to start  anticoagulation in 1 week by neurosurgery.  IR consulted due to question of need for Port-A-Cath placement--- to hold for now.   Plans for XRT to the brain in the future.  She had significant issues with pain control requiring IV Dilaudid and Toradol, improving.  Therapy ongoing and patient limited by right sided weakness with instability as well as ongoing right calf pain with mobility.  CIR recommended due to functional decline. Please see preadmission assessment from earlier today as well.   Review of Systems  Constitutional: Positive for malaise/fatigue. Negative for chills and fever.  HENT: Negative for hearing loss and tinnitus.   Eyes: Negative for blurred vision and double vision.  Respiratory: Positive for shortness of breath.   Gastrointestinal: Negative for heartburn and nausea.  Musculoskeletal: Positive for back pain and myalgias.  Neurological: Positive for weakness. Negative for dizziness and headaches.  All other systems reviewed and are negative.     Past Medical History:  Diagnosis Date  . Bronchitis   . Hypertension   . Osteoarthritis of metacarpophalangeal (MCP) joint of left thumb   . Pelvic ring fracture ([place]) 09/2016   left    Past Surgical History:  Procedure Laterality Date  . APPLICATION OF CRANIAL NAVIGATION Left 04/05/2019   Procedure: APPLICATION OF CRANIAL NAVIGATION;  Surgeon: Earnie Larsson, MD;  Location: [facility];  Service: Neurosurgery;  Laterality: Left;  . CRANIOTOMY Left 04/05/2019   Procedure: LEFT FRONTAL CRANIOTOMY TUMOR EXCISION w/Brain Lab;  Surgeon: Earnie Larsson, MD;  Location: [place];  Service: Neurosurgery;  Laterality: Left;  LEFT FRONTAL CRANIOTOMY TUMOR EXCISION w/Brain Lab    Family History  Problem Relation Age of Onset  .  CAD Father        CABG  . Stroke Father   . Cancer Brother        lymphoma    Social History:  Married. Independent without AD. Works as a Geophysicist/field seismologist. Husband is a Forensic psychologist. She reports that she has been  smoking cigarettes--1/2  PPD for past 40 years.  She has never used smokeless tobacco. She reports that she does not drink alcohol or use drugs.    Allergies  Allergen Reactions  . Codeine   . Lisinopril-Hydrochlorothiazide Other (See Comments)    Back pain   Medications Prior to Admission  Medication Sig Dispense Refill  . acetaminophen (TYLENOL) 325 MG tablet Take 2 tablets (650 mg total) by mouth every 4 (four) hours as needed for mild pain or fever (temp > 100.5). 60 tablet 0  . acetaminophen (TYLENOL) 650 MG suppository Place 1 suppository (650 mg total) rectally every 4 (four) hours as needed for mild pain (temp > 100.5). 12 suppository 0  . amLODipine (NORVASC) 10 MG tablet TAKE 1 TABLET (10 MG TOTAL) BY MOUTH DAILY. NEED OFFICE VISIT (Patient taking differently: Take 10 mg by mouth daily. ) 90 tablet 0  . aspirin 81 MG tablet Take 81 mg by mouth daily.    Marland Kitchen dexamethasone (DECADRON) 1 MG tablet Take 1 tablet (1 mg total) by mouth every 8 (eight) hours for 3 days. 9 tablet 0  . dexamethasone (DECADRON) 2 MG tablet Take 1 tablet (2 mg total) by mouth every 8 (eight) hours for 2 days. 5 tablet 0  . diphenhydrAMINE (BENADRYL) 25 mg capsule Take 1 capsule (25 mg total) by mouth every 8 (eight) hours as needed for itching. 30 capsule 0  . enoxaparin (LOVENOX) 60 MG/0.6ML injection Inject 0.6 mLs (60 mg total) into the skin 2 (two) times daily. 36 mL 0  . feeding supplement, ENSURE ENLIVE, (ENSURE ENLIVE) LIQD Take 237 mLs by mouth 2 (two) times daily between meals. 237 mL 12  . gabapentin (NEURONTIN) 300 MG capsule Take 300 mg by mouth 3 (three) times daily.     . hydrALAZINE (APRESOLINE) 25 MG tablet TAKE 1 TABLET BY MOUTH TWICE A DAY (Patient taking differently: Take 25 mg by mouth 2 (two) times daily. ) 60 tablet 12  . HYDROcodone-acetaminophen (NORCO/VICODIN) 5-325 MG tablet Take 1 tablet by mouth every 4 (four) hours as needed for moderate pain. 30 tablet 0  . ibuprofen (ADVIL) 800 MG  tablet Take 800 mg by mouth 3 (three) times daily as needed for moderate pain.     Marland Kitchen ketorolac (TORADOL) 10 MG tablet Take 1 tablet (10 mg total) by mouth every 6 (six) hours. 20 tablet 0  . levETIRAcetam (KEPPRA) 500 MG tablet Take 1 tablet (500 mg total) by mouth 2 (two) times daily. 60 tablet 0  . nicotine (NICODERM CQ - DOSED IN MG/24 HR) 7 mg/24hr patch Place 1 patch (7 mg total) onto the skin daily. 28 patch 0  . ondansetron (ZOFRAN) 4 MG tablet Take 1 tablet (4 mg total) by mouth every 4 (four) hours as needed for nausea or vomiting. 20 tablet 0  . ondansetron (ZOFRAN) 4 MG/2ML SOLN injection Inject 2 mLs (4 mg total) into the vein every 4 (four) hours as needed for nausea or vomiting. 2 mL 0  . promethazine (PHENERGAN) 12.5 MG tablet Take 1-2 tablets (12.5-25 mg total) by mouth every 4 (four) hours as needed for refractory nausea / vomiting. 30 tablet 0  Drug Regimen Review  Drug regimen was reviewed and remains appropriate with no significant issues identified  Home: Home Living Family/patient expects to be discharged to:: Private residence Living Arrangements: Spouse/significant other, Parent Available Help at Discharge: Family Type of Home: House Home Access: Ramped entrance Home Layout: One level Bathroom Shower/Tub: Multimedia programmer: Handicapped height Bathroom Accessibility: Yes Home Equipment: Environmental consultant - 2 wheels, [place] - single point   Functional History: Prior Function Level of Independence: Independent Comments: fully independent prior, weakness started ~2-3 weeks prior to sx  Functional Status:  Mobility: Bed Mobility Overal bed mobility: Needs Assistance Bed Mobility: Supine to Sit Supine to sit: Min assist Sit to supine: Min assist General bed mobility comments: min assist for RLE and RUE management, shifting weight L and R to scoot to EOB. Increased time and effort. Transfers Overall transfer level: Needs assistance Equipment used: 2 person  hand held assist Transfers: Sit to/from Stand Sit to Stand: +2 safety/equipment, Mod assist General transfer comment: sit to stand x3 due to seated rest breaks during gait, mod assist +2 via HHA for power up, steadying, blocking RLE. Ambulation/Gait Ambulation/Gait assistance: Mod assist, +2 safety/equipment Gait Distance (Feet): 20 Feet(5+5+10 ft) Assistive device: 1 person hand held assist Gait Pattern/deviations: Step-to pattern, Decreased step length - right, Drifts right/left, Narrow base of support, Decreased weight shift to right, Step-through pattern, Decreased stance time - right General Gait Details: mod assist for trunk support, physical assist with weight shift L and R, RLE blocking with RLE stance phase, and RUE support in standing. 3 trials ambulation this session, 5+5+10 ft limited by R calf pain. Emphasis on LLE progression this session, as pt reluctant to WB through RLE for larger step. Gait velocity: decr    ADL: ADL Overall ADL's : Needs assistance/impaired Eating/Feeding: Set up, Sitting Grooming: Set up, Sitting Upper Body Bathing: Moderate assistance, Sitting Lower Body Bathing: Moderate assistance, Sit to/from stand, Sitting/lateral leans Upper Body Dressing : Moderate assistance, Sitting Lower Body Dressing: Moderate assistance, Sit to/from stand, Sitting/lateral leans Lower Body Dressing Details (indicate cue type and reason): mod A sitting EOB to don socks. Mod A to hold RLE in figure 4 and start sock donning process Toilet Transfer: Moderate assistance, Stand-pivot, BSC Toileting- Clothing Manipulation and Hygiene: Moderate assistance, Sit to/from stand Functional mobility during ADLs: Moderate assistance, +2 for physical assistance, +2 for safety/equipment General ADL Comments: pt limited by R sided weakness and decreased activity tolerance  Cognition: Cognition Overall Cognitive Status: Within Functional Limits for tasks assessed Orientation Level: Oriented  X4 Cognition Arousal/Alertness: Awake/alert Behavior During Therapy: WFL for tasks assessed/performed, Flat affect Overall Cognitive Status: Within Functional Limits for tasks assessed General Comments: pt reports she is tired of laying in the bed, eager to get up  Physical Exam: Blood pressure 122/74, pulse (!) 53, temperature (!) 97.5 F (36.4 C), temperature source Oral, resp. rate 16, height 5\' 7"  (1.702 m), weight 60.8 kg, SpO2 98 %. Physical Exam  Vitals reviewed. Constitutional: She is oriented to person, place, and time. She appears well-developed.  Frail  HENT:  Head: Normocephalic and atraumatic.  Eyes: EOM are normal. Right eye exhibits no discharge. Left eye exhibits no discharge.  Neck: No tracheal deviation present. No thyromegaly present.  Respiratory: Effort normal. No stridor. No respiratory distress.  GI: She exhibits no distension.  Musculoskeletal:     Comments: Right lower extremity tenderness  Neurological: She is alert and oriented to person, place, and time.  Motor: Left upper extremity: 5/5  proximal distal Right upper extremity: Approximately 0/5, handgrip 3 -/5 with apraxia Left lower extremity: Hip flexion, knee extension 4 -/5, ankle dorsiflexion 4/5 Right lower extremity: Hip flexion, knee extension 2/5, ankle dorsiflexion 4/5 Sensation intact to light touch  Skin: Skin is warm and dry.  Psychiatric: She has a normal mood and affect. Her behavior is normal.   No results found for this or any previous visit (from the past 48 hour(s)). No results found.  Medical Problem List and Plan: 1.  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   -patient may shower  -ELOS/Goals: 14-17 days/MinA PT, OT  -PRAFO to right lower extremity to prevent plantarflexion contracture  Admit to CIR 2.  Antithrombotics: -RLE DVT/anticoagulation:  Pharmaceutical: Therapeutic Lovenox,?  Transition to NOAC after  discharge  -antiplatelet therapy: N/A 3. Pain Management: Oxycodone prn  Monitor with increased mobility 4. Mood: LCSW to follow for evaluation   -antipsychotic agents: N/A 5. Neuropsych: This patient is capable of making decisions on her own behalf. 6. Skin/Wound Care: Routine pressure relief measures 7. Fluids/Electrolytes/Nutrition: Ensure Enlive po BID for increased nutritional needs due to cancer, appreciate dietary input.  CMP ordered. 8. Stage 4 adenocarcinoma of the lung with metastases to the brain and spine:  -Post-op brain radiation to start on Jan 4th.  -Specimen sent for genetic analysis, results should take 7-10 days  Palliative care consult 9. Spasticity: Start baclofen 5mg  TID for increased tone in right lower extremity, monitor for sedation.    Bary Leriche, PA-C 04/13/2019  I have personally performed a face to face diagnostic evaluation, including, but not limited to relevant history and physical exam findings, of this patient and developed relevant assessment and plan.  Additionally, I have reviewed and concur with the physician assistant's documentation above.  Delice Lesch, MD, ABPMR

## 2019-04-11 NOTE — Progress Notes (Signed)
Physical Therapy Treatment Patient Details Name: [name] MRN: [id number] DOB: 03-Sep-1955 Today's Date: 04/11/2019    History of Present Illness 63 yo female admitted to ED on 12/7 with RLE and RUE weakness and altered sensation over the past 2-3 weeks. CT head revealed L frontal lobe edema with mass, s/p L frontal craniotomy with tumor resection on 12/11. Pt with suspected SMA irritation post-operatively with increased weakness RUE/LE. Per oncology, pt with suspected stage IV bronchogenic carcinoma with mets to bilateral lungs, L3 vertebral body, and L thoracic node. Hospital course complicatied by RLE DVT, now therapeutic to see. PMH includes HTN, tobacco use, hip stress fracture 1 year ago with recurrence 1 month ago. 04/09/19 bone scan showed metastases: multiple ribs bilaterally, L3 vertebral body, and left acetabulum     PT Comments    Pt with improved tolerance for gait training this session, continues to require rest breaks due to R lower leg pain and throbbing. Pt eager to d/c to CIR today to maximize functional outcomes, PT expects pt to progress well with mobility given her motivation and positive social support.    Follow Up Recommendations  CIR     Equipment Recommendations  Other (comment)(defer to next venue)    Recommendations for Other Services       Precautions / Restrictions Precautions Precautions: Fall Precaution Comments: craniotomy; pathologic fracture L3 Restrictions Weight Bearing Restrictions: No    Mobility  Bed Mobility Overal bed mobility: Needs Assistance Bed Mobility: Supine to Sit     Supine to sit: Min assist     General bed mobility comments: min assist for RLE and RUE management, shifting weight L and R to scoot to EOB. Increased time and effort.  Transfers Overall transfer level: Needs assistance Equipment used: 2 person hand held assist Transfers: Sit to/from Stand Sit to Stand: +2 safety/equipment;Mod assist         General  transfer comment: sit to stand x3 due to seated rest breaks during gait, mod assist +2 via HHA for power up, steadying, blocking RLE.  Ambulation/Gait Ambulation/Gait assistance: Mod assist;+2 safety/equipment Gait Distance (Feet): 20 Feet(5+5+10 ft) Assistive device: 1 person hand held assist Gait Pattern/deviations: Step-to pattern;Decreased step length - right;Drifts right/left;Narrow base of support;Decreased weight shift to right;Step-through pattern;Decreased stance time - right Gait velocity: decr   General Gait Details: mod assist for trunk support, physical assist with weight shift L and R, RLE blocking with RLE stance phase, and RUE support in standing. 3 trials ambulation this session, 5+5+10 ft limited by R calf pain. Emphasis on LLE progression this session, as pt reluctant to WB through RLE for larger step.   Stairs             Wheelchair Mobility    Modified Rankin (Stroke Patients Only)       Balance Overall balance assessment: Needs assistance Sitting-balance support: Feet supported;No upper extremity supported Sitting balance-Leahy Scale: Fair Sitting balance - Comments: able to sit EOC without imbalance   Standing balance support: Single extremity supported Standing balance-Leahy Scale: Poor Standing balance comment: reliant on external support in standing                            Cognition Arousal/Alertness: Awake/alert Behavior During Therapy: WFL for tasks assessed/performed;Flat affect Overall Cognitive Status: Within Functional Limits for tasks assessed  General Comments: pt reports she is tired of laying in the bed, eager to get up      Exercises Other Exercises Other Exercises: R heel cord stretch with gait belt loop, 3x30 seconds, gentle stretching emphasized. 10* PF was maximum stretch    General Comments        Pertinent Vitals/Pain Pain Assessment: Faces Faces Pain Scale:  Hurts even more Pain Location: Rt calf, during gait training Pain Descriptors / Indicators: Sore;Discomfort Pain Intervention(s): Limited activity within patient's tolerance;Monitored during session;Premedicated before session;Repositioned    Home Living                      Prior Function            PT Goals (current goals can now be found in the care plan section) Acute Rehab PT Goals Patient Stated Goal: get back to my independence PT Goal Formulation: With patient/family Time For Goal Achievement: 04/22/19 Potential to Achieve Goals: Good Progress towards PT goals: Progressing toward goals    Frequency    Min 4X/week      PT Plan Current plan remains appropriate    Co-evaluation              AM-PAC PT "6 Clicks" Mobility   Outcome Measure  Help needed turning from your back to your side while in a flat bed without using bedrails?: A Little Help needed moving from lying on your back to sitting on the side of a flat bed without using bedrails?: A Little Help needed moving to and from a bed to a chair (including a wheelchair)?: A Lot Help needed standing up from a chair using your arms (e.g., wheelchair or bedside chair)?: A Lot Help needed to walk in hospital room?: A Lot Help needed climbing 3-5 steps with a railing? : Total 6 Click Score: 13    End of Session Equipment Utilized During Treatment: Gait belt Activity Tolerance: Patient limited by fatigue;Patient limited by pain Patient left: with call bell/phone within reach;in bed;with bed alarm set Nurse Communication: Mobility status;Patient requests pain meds PT Visit Diagnosis: Other symptoms and signs involving the nervous system (R29.898);Muscle weakness (generalized) (M62.81);Difficulty in walking, not elsewhere classified (R26.2)     Time: 8657-8469 PT Time Calculation (min) (ACUTE ONLY): 23 min  Charges:  $Gait Training: 8-22 mins $Therapeutic Activity: 8-22 mins                      Jeanne Diefendorf E, PT Acute Rehabilitation Services Pager [phone]  Office [phone]    Limuel Nieblas D [facility] 04/11/2019, 1:32 PM

## 2019-04-12 ENCOUNTER — Inpatient Hospital Stay (HOSPITAL_COMMUNITY): Payer: BC Managed Care – PPO | Admitting: Occupational Therapy

## 2019-04-12 ENCOUNTER — Ambulatory Visit: Payer: BC Managed Care – PPO | Admitting: Family Medicine

## 2019-04-12 ENCOUNTER — Inpatient Hospital Stay (HOSPITAL_COMMUNITY): Payer: BC Managed Care – PPO | Admitting: Physical Therapy

## 2019-04-12 DIAGNOSIS — C7931 Secondary malignant neoplasm of brain: Secondary | ICD-10-CM | POA: Diagnosis not present

## 2019-04-12 MED ORDER — KETOROLAC TROMETHAMINE 10 MG PO TABS: 10.0000 mg | ORAL_TABLET | Freq: Four times a day (QID) | ORAL | 0 refills | Status: DC

## 2019-04-12 MED ORDER — KETOROLAC TROMETHAMINE 10 MG PO TABS
10.0000 mg | ORAL_TABLET | Freq: Four times a day (QID) | ORAL | Status: DC
  Administered 2019-04-12 – 2019-04-13 (×4): 10 mg via ORAL
  Filled 2019-04-12 (×6): qty 1

## 2019-04-12 NOTE — Progress Notes (Deleted)
PMR Admission Coordinator Pre-Admission Assessment   Patient: [name] is an 63 y.o., female MRN: [id number] DOB: March 28, 1956 Height: 5' 7" (170.2 cm) Weight: 60.8 kg                                                                                                                                                  Insurance Information HMO:     PPO: yes     PCP:      IPA:      80/20:      OTHER:  PRIMARY: [employer] [place]      Policy#: [id number]      Subscriber: Patient CM Name: Merrilyn Puma      Phone#: [phone]     Fax#: [phone] Pre-Cert#: [id number]      Employer:  Josem Kaufmann provided by Ree Shay for admit to CIR Effective dates: 12/16 with clinical updates due 12/30. Updates due to (f): [phone]; Per [place], the patient has until 12/30 to admit to CIR. Update date of 12/30 stays the same no matter what date the patient admits.  Benefits:  Phone #: [phone]     Name:  Eff. Date: 04/25/2018 - 04/25/2019     Deduct: $7,500 ($806.15 met)      Out of Pocket Max: $8,150 (if still active on 04/26/2019, will re-set, max includes deductible - $1,669.69 met)      Life Max: NA CIR: after deductible, 60% coverage, 40% co-insurance      SNF: after deductible, 60% coverage, 40% co-insurance; with a limit of 60 days/cal yr Outpatient: $115/visit co-pay; limited to 30 visits combined PT/OT rehabilitative, 30 visits combined PT/OT habilitative, 30 visits ST rehabilitative, 30 visits habilitative    Home Health: after deductible, 60% coverage, 40% co-insurance; limited by medical necessity     DME: after deductible, 60% coverage, 40% co-insurance      Providers:  SECONDARY: None      Policy#:       Subscriber:  CM Name:       Phone#:      Fax#:  Pre-Cert#:       Employer:  Benefits:  Phone #:      Name:  Eff. Date:      Deduct:       Out of Pocket Max:       Life Max CIR:       SNF:  Outpatient:      Co-Pay:  Home Health:       Co-Pay:  DME:      Co-Pay:    Medicaid Application  Date:       Case Manager:  Disability Application Date:       Case Worker:    The "Data Collection Information Summary" for patients in Inpatient Rehabilitation Facilities with attached "[facility] Records" was provided and verbally reviewed with:  N/A   Emergency Contact Information         Contact Information     Name Relation Home Work Mobile    Stephens,Eric Spouse     [phone]       Current Medical History  Patient Admitting Diagnosis: Cancer and right hemiplegia due to mets to brain s/p craniotomy.    History of Present Illness: [name] is a 63 y.o. female with history of HTN, OA who otherwise was in good health who started developing right sided weakness with foot drag and difficulty lifting her arm as well as left sided chest pain under her left breast for about 2-3 weeks prior to showing up for evaluation in ED on 04/01/19. CT head done revealing large area of vasogenic edema in left frontal lobe concerning for metastatic disease. Neurology recommended MRI brain for work up and this revealed presumed solitary left frontal lobe mass with moderate vasogenic edema and extensive small vessel disease.  She was started on Keppra and IV decadron and transferred to [facility] for further management.    CT chest, abdomen and pelvis revealed 3.0 X2.4X 3.6 cm spiculated mass in medial right upper lobe compatible with primary bronchogenic neoplasm with additional bilateral pulmonary metastases in LUL and BLL, thoracic nodal metastases, left supraclavicular nodal metastasis, hepatic cyst and lytic osseous metastasis along anterior aspect of L3 vertebral body. Hem/onc--Dr. Marin Olp consulted for input and recommended stereotactic crani with resection of tumor for likely primary bronchogenic carcinoma RUL.  Radiation Onc consulted for palliiative Tx of left chest wall due to ongoing severe pain in back and left lower chest wall and plans are to start 2 week course treatment after  pathology available. Dr. Annette Stable consulted and patient underwent left frontal crani with resection of tumor on 12/11.  Pathology positive for adenocarcinoma and neurosurgery recommends holding off on other treatment for a week. Treatment options to be discussed with tumor board 12/21.    She continues to have right sided weakness with numbness and steroids being weaned off. She developed severe right calf pain on 12/13 pm and  BLE dopplers done revealing age indeterminate DVT involving right femoral, right gastrocnemius/popliteal and right popliteal veins. Dr. Marin Olp recommends continuing Lovenox for now. PT/OT evaluation done revealing right sided weakness with sensory deficits affecting mobility and ADLs. CIR recommended due to recent functional decline. Pt was planned to admit to CIR on 04/11/2019, However, do to increased acute pain in RLE, pt required change in pain medicine regimen and admission was held. Due to lack of bed availability, pt is to admit to CIR on 04/13/2019.        Complete NIHSS TOTAL: 10 Glasgow Coma Scale Score: 15   Past Medical History      Past Medical History:  Diagnosis Date  . Bronchitis    . Hypertension    . Osteoarthritis of metacarpophalangeal (MCP) joint of left thumb    . Pelvic ring fracture ([place]) 09/2016    left      Family History  family history includes CAD in her father; Cancer in her brother; Stroke in her father.   Prior Rehab/Hospitalizations:  Has the patient had prior rehab or hospitalizations prior to admission? No   Has the patient had major surgery during 100 days prior to admission? Yes   Current Medications    Current Facility-Administered Medications:  .  0.9 %  sodium chloride infusion, , Intravenous, Continuous, Pool, Mallie Mussel, MD, Last Rate: 75 mL/hr at 04/09/19 0418, New Bag at 04/09/19  [id number] .  acetaminophen (TYLENOL) tablet 650 mg, 650 mg, Oral, Q4H PRN, 650 mg at 04/07/19 0943 **OR** acetaminophen (TYLENOL) suppository 650 mg,  650 mg, Rectal, Q4H PRN, Earnie Larsson, MD .  Derrill Memo ON 04/13/2019] dexamethasone (DECADRON) tablet 1 mg, 1 mg, Oral, Q8H, Bergman, Meghan D, NP .  [COMPLETED] dexamethasone (DECADRON) tablet 6 mg, 6 mg, Oral, Q6H, 6 mg at 04/06/19 1206 **FOLLOWED BY** [COMPLETED] dexamethasone (DECADRON) tablet 4 mg, 4 mg, Oral, Q6H, 4 mg at 04/07/19 1224 **FOLLOWED BY** dexamethasone (DECADRON) tablet 2 mg, 2 mg, Oral, Q8H, Bergman, Meghan D, NP, 2 mg at 04/11/19 0430 .  diphenhydrAMINE (BENADRYL) capsule 25 mg, 25 mg, Oral, Q8H PRN, Pool, Henry, MD .  enoxaparin (LOVENOX) injection 60 mg, 1 mg/kg, Subcutaneous, BID, Bergman, Meghan D, NP, 60 mg at 04/11/19 0951 .  feeding supplement (ENSURE ENLIVE) (ENSURE ENLIVE) liquid 237 mL, 237 mL, Oral, BID BM, Earnie Larsson, MD, 237 mL at 04/10/19 1827 .  gabapentin (NEURONTIN) capsule 300 mg, 300 mg, Oral, TID PRN, Earnie Larsson, MD, 300 mg at 04/09/19 0417 .  HYDROcodone-acetaminophen (NORCO/VICODIN) 5-325 MG per tablet 1 tablet, 1 tablet, Oral, Q4H PRN, Earnie Larsson, MD, 1 tablet at 04/11/19 0430 .  HYDROmorphone (DILAUDID) injection 0.5-1 mg, 0.5-1 mg, Intravenous, Q2H PRN, Earnie Larsson, MD, 1 mg at 04/11/19 0801 .  labetalol (NORMODYNE) injection 10-40 mg, 10-40 mg, Intravenous, Q10 min PRN, Earnie Larsson, MD, 20 mg at 04/06/19 1030 .  levETIRAcetam (KEPPRA) tablet 500 mg, 500 mg, Oral, BID, Ostergard, Joyice Faster, MD, 500 mg at 04/11/19 0951 .  naloxone [facility]) injection 0.08 mg, 0.08 mg, Intravenous, PRN, Earnie Larsson, MD .  nicotine (NICODERM CQ - dosed in mg/24 hr) patch 7 mg, 7 mg, Transdermal, Daily, Ostergard, Thomas A, MD .  ondansetron (ZOFRAN) tablet 4 mg, 4 mg, Oral, Q4H PRN **OR** ondansetron (ZOFRAN) injection 4 mg, 4 mg, Intravenous, Q4H PRN, Earnie Larsson, MD, 4 mg at 04/06/19 1032 .  pantoprazole (PROTONIX) EC tablet 40 mg, 40 mg, Oral, Q supper, Alvira Philips, [place], 40 mg at 04/10/19 1702 .  promethazine (PHENERGAN) tablet 12.5-25 mg, 12.5-25 mg, Oral, Q4H PRN,  Earnie Larsson, MD .  senna-docusate (Senokot-S) tablet 1 tablet, 1 tablet, Oral, QHS PRN, Earnie Larsson, MD   Patients Current Diet:     Diet Order                      Diet regular Room service appropriate? 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 No AD use PTA   Prior Functional Level Prior Function Level of Independence: Independent Comments: fully independent prior, weakness started ~2-3 weeks prior to sx   Self Care: Did the patient need help bathing, dressing, using the toilet or eating?  Independent   Indoor Mobility: Did the patient need assistance with walking from room to room (with or without device)? Independent   Stairs: Did the patient need assistance with internal or external stairs (with or without device)? Independent   Functional Cognition: Did the patient need  help planning regular tasks such as shopping or remembering to take medications? Independent   Home Assistive Devices / Equipment Home Assistive Devices/Equipment: Eyeglasses Home Equipment: Walker - 2 wheels, Cane - single point   Prior Device Use: Indicate devices/aids used by the patient prior to current illness, exacerbation or injury? None of the above but because her parents live with her, she has a ramp for safe entry, vehicle lift for wc, hoyer lift, seat in shower, and a wheelchair accessible home.    Current Functional Level Cognition   Overall Cognitive Status: Within Functional Limits for tasks assessed Orientation Level: Oriented X4 General Comments: Pt states she had an  emotional morning, but feels better now    Extremity Assessment (includes Sensation/Coordination)   Upper Extremity Assessment: RUE deficits/detail, LUE deficits/detail RUE Deficits / Details: trace movement in trap, deltoid, and bicep. Decreased sensation throughout- needs more detailed testing LUE Deficits / Details: WFL  Lower Extremity Assessment: Overall WFL for tasks assessed, RLE deficits/detail RLE Deficits / Details: hip flexion 2-/5, knee extension 2+/5, knee flexion 1/5, DF/PF 0/5. Hypertonicity R PF RLE Sensation: WNL RLE Coordination: decreased gross motor, decreased fine motor     ADLs   Overall ADL's : Needs assistance/impaired Eating/Feeding: Set up, Sitting Grooming: Set up, Sitting Upper Body Bathing: Moderate assistance, Sitting Lower Body Bathing: Moderate assistance, Sit to/from stand, Sitting/lateral leans Upper Body Dressing : Moderate assistance, Sitting Lower Body Dressing: Moderate assistance, Sit to/from stand, Sitting/lateral leans Lower Body Dressing Details (indicate cue type and reason): mod A sitting EOB to don socks. Mod A to hold RLE in figure 4 and start sock donning process Toilet Transfer: Moderate assistance, Stand-pivot, BSC Toileting- Clothing Manipulation and Hygiene: Moderate assistance, Sit to/from stand Functional mobility during ADLs: Moderate assistance, +2 for physical assistance, +2 for safety/equipment General ADL Comments: pt limited by R sided weakness and decreased activity tolerance     Mobility   Overal bed mobility: Needs Assistance Bed Mobility: Supine to Sit Supine to sit: Min assist Sit to supine: Min assist General bed mobility comments: min assist for RLE and RUE management, pt with increased time to scoot to EOB but no physical assist needed.     Transfers   Overall transfer level: Needs assistance Equipment used: 1 person hand held assist Transfers: Sit to/from Stand Sit to Stand: Mod assist, +2 safety/equipment, Min  assist General transfer comment: Sit to stand x4 during session due to seated rest breaks during gait training. Initially mod assist for power up, steadying, and RLE blocking, required min assist with continued repetitions.     Ambulation / Gait / Stairs / Wheelchair Mobility   Ambulation/Gait Ambulation/Gait assistance: Mod assist, +2 safety/equipment Gait Distance (Feet): 5 Feet(3x5 ft) Assistive device: 1 person hand held assist Gait Pattern/deviations: Step-to pattern, Decreased step length - right, Drifts right/left, Narrow base of support, Decreased weight shift to right, Step-through pattern, Decreased stance time - right General Gait Details: mod assist for trunk support, physical assist with weight shift L and R, RLE blocking with RLE stance phase, and RUE support in standing.3 bouts of 5 ft ambulation, seated rest break x2 minutes between each round ambulation. Gait velocity: decr     Posture / Balance Dynamic Sitting Balance Sitting balance - Comments: able to sit EOC without imbalance Balance Overall balance assessment: Needs assistance Sitting-balance support: Feet supported, No upper extremity supported Sitting balance-Leahy Scale: Fair Sitting balance - Comments: able to sit EOC without imbalance Postural control: Posterior lean Standing balance support:  Single extremity supported Standing balance-Leahy Scale: Poor Standing balance comment: reliant on external support in standing     Special needs/care consideration BiPAP/CPAP: no CPM: no Continuous Drip IV: 0.9% sodium chloride infusion  Dialysis: no        Days: no Life Vest: no Oxygen: no Special Bed: no Trach Size: no Wound Vac (area): no      Location: no Skin: surgical incision to head                         Bowel mgmt: last BM 04/04/2019  Bladder mgmt: continent  Diabetic mgmt: no Behavioral consideration : no Chemo/radiation : Plans are to begin this in January.  Appointments per Radiation Oncology  (spoke with Aldona Bar RN to confirm [phone]): -Discussion of pathology with Tumor Board on Dec 21st (pt is not present for this discussion)  -Jan 4 at 2pm schedule for MRI at [facility] -Jan 5th at [facility] 9:00am, 9:30AM, 10:00AM, 10:30AM.  -Jan 7th at [facility] 9:30AM (approx 45 min appointment)      Previous Home Environment (from acute therapy documentation) Living Arrangements: Spouse/significant other, Parent Available Help at Discharge: Family Type of Home: [place]: One level Home Access: Ramped entrance Bathroom Shower/Tub: Multimedia programmer: Handicapped height [place]: No   Discharge [place] for Discharge Living Setting: Patient's home, Lives with (comment)(husband, her mom&dad, sister in law) Type of Home at Discharge: House Discharge [place]: One level Discharge Home Access: [place] entrance Discharge Bathroom Shower/Tub: Walk-in shower(with seat (has back but no arm rests)) Discharge Bathroom Toilet: Handicapped height Discharge Bathroom Accessibility: Yes How Accessible: Accessible via wheelchair, Accessible via walker Does the patient have any problems obtaining your medications?: No   Social/Family/Support Systems Patient Roles: Spouse, Other (Comment)(owns own [place], spouse, close to parents) Sport and exercise psychologist Information: husband [name]): [phone] Anticipated Caregiver: husband and sister in law (both at home) Anticipated Caregiver's Contact Information: see above Ability/Limitations of Caregiver: Min A Caregiver Availability: 24/7 Discharge Plan Discussed with Primary Caregiver: Yes(with pt and husband) Is Caregiver In Agreement with Plan?: Yes Does Caregiver/Family have Issues with Lodging/Transportation while Pt is in Rehab?: No     Goals/Additional Needs Patient/Family Goal for Rehab: PT/OT: Supervision/Min A; SLP: NA Expected length of stay: 10-15 days Cultural  Considerations: NA Dietary Needs: regular diet, thin liquids  Equipment Needs: TBD Special Service Needs: Stage 4 cancer, may be pending chemo and radiation  Pt/Family Agrees to Admission and willing to participate: Yes Program Orientation Provided & Reviewed with Pt/Caregiver Including Roles  & Responsibilities: Yes(pt and her husband)  Barriers to Discharge: Insurance for SNF coverage, Pending chemo/radiation     Decrease burden of Care through IP rehab admission: NA     Possible need for SNF placement upon discharge:Not anticipated; pt has great social support at DC. Pt is motivated to return home with as much independence as possible and understands the plan is to return straight home after a CIR stay with assistance as needed. Pt has an accessible environment that can support a wheelchair if needed.      Patient Condition: This patient's medical and functional status has changed since the consult dated: 04/09/2019 in which the Rehabilitation Physician determined and documented that the patient's condition is appropriate for intensive rehabilitative care in an inpatient rehabilitation facility. See "History of Present Illness" (above) for medical update. Functional changes are: slight improvement in gait distance from 4 feet to  20 feet. Patient's medical and functional status update has been discussed with the Rehabilitation physician and patient remains appropriate for inpatient rehabilitation. Will admit to inpatient rehab today. *Pt did not admit to CIR on 12/17 due to acute pain and pt/family request to hold admission. Pt's pain regime was altered and pt is to admit to CIR on 04/13/2019, as a bed will be available for this patient.    Preadmission Screen Completed By:  Raechel Ache, OT, 04/11/2019 10:37 AM ______________________________________________________________________   Discussed status with Dr. Ranell Patrick on 04/11/2019 at 10:06AM and received approval for admission on 04/11/2019.    Admission Coordinator:  Raechel Ache, time 10:06AM/Date 04/11/2019      *Discussed status with admitting PM&R MD, Dr. Posey Pronto on 04/12/2019 at 12:00pm and received approval for admit to CIR on 04/13/2019.   Admission Coordinator: Raechel Ache, time 3:22PM/Date: 04/12/2019.

## 2019-04-12 NOTE — Progress Notes (Signed)
Looks like Ms. Massimo is improving with respect to the strength in her right arm and leg.  Apparently, she had issues with pain in the right leg yesterday.  She has had no pain today.  She is on Lovenox.  I suspect that the Lovenox is working in the thrombus is thinning out a little bit.  Her leg does not appear to be all that swollen.  Hopefully, should be able to go to inpatient rehab today.  She is not complain of any pain in the left shoulder area.  She still has not had a bowel movement.  She does not look distended.  I think her appetite might be a little bit better.  I will see about getting a compression stocking for her right leg.  We are sending the pathology specimen off for molecular analysis.  This will take a good week or so.  I spoke to radiation oncology.  I told him that she is ready for radiation therapy to the brain.  Hopefully, they will get this started on her.  We do not need to have a Port-A-Cath put in as of yet.  She does look better.  It has only been a week since her surgery and she seems to be making nice progress.  We will see about having some labs checked on her next week.  Lattie Haw, MD  Oswaldo Milian 12:2

## 2019-04-12 NOTE — Progress Notes (Signed)
Overall slowly improving.  Right leg pain improved after Toradol administration.  I suspect pain was coming from inflammation associated with her and deep venous thrombus.  Right sided weakness slowly improving.  Now just about antigravity with throat right upper extremity.  Wound healing well.  Spirits good.  Working towards rehab tomorrow.

## 2019-04-12 NOTE — Progress Notes (Signed)
Inpatient Rehabilitation-Admissions Coordinator   Met with pt at the bedside this afternoon. She is resting comfortably and wants to pursue CIR at this time. I have a bed available Saturday and she has given consent. Husband is aware.   I have approval from attending service for admission this Saturday, 04/13/2019. Admitting PM&R physician, Dr. Posey Pronto, will see patient Sauturday morning to confirm patient readiness. Pt's RN can call [facility] nursing station ([phone]) by noon to get report.  Please call if questions.   Raechel Ache, OTR/L  Rehab Admissions Coordinator  [phone] 04/12/2019 12:55 PM

## 2019-04-13 ENCOUNTER — Encounter (HOSPITAL_COMMUNITY): Payer: Self-pay | Admitting: Physical Medicine & Rehabilitation

## 2019-04-13 ENCOUNTER — Inpatient Hospital Stay (HOSPITAL_COMMUNITY)
Admission: RE | Admit: 2019-04-13 | Discharge: 2019-04-23 | DRG: 092 | Disposition: A | Discharge status: Home / Self Care | Payer: BC Managed Care – PPO | Source: Intra-hospital | Attending: Physical Medicine & Rehabilitation | Admitting: Physical Medicine & Rehabilitation

## 2019-04-13 DIAGNOSIS — G893 Neoplasm related pain (acute) (chronic): Secondary | ICD-10-CM | POA: Diagnosis not present

## 2019-04-13 DIAGNOSIS — Z86718 Personal history of other venous thrombosis and embolism: Secondary | ICD-10-CM | POA: Diagnosis not present

## 2019-04-13 DIAGNOSIS — R252 Cramp and spasm: Secondary | ICD-10-CM

## 2019-04-13 DIAGNOSIS — I82461 Acute embolism and thrombosis of right calf muscular vein: Secondary | ICD-10-CM | POA: Diagnosis present

## 2019-04-13 DIAGNOSIS — D72828 Other elevated white blood cell count: Secondary | ICD-10-CM | POA: Diagnosis not present

## 2019-04-13 DIAGNOSIS — I10 Essential (primary) hypertension: Secondary | ICD-10-CM | POA: Diagnosis not present

## 2019-04-13 DIAGNOSIS — C3411 Malignant neoplasm of upper lobe, right bronchus or lung: Secondary | ICD-10-CM | POA: Diagnosis present

## 2019-04-13 DIAGNOSIS — Z7189 Other specified counseling: Secondary | ICD-10-CM | POA: Diagnosis not present

## 2019-04-13 DIAGNOSIS — Z7982 Long term (current) use of aspirin: Secondary | ICD-10-CM

## 2019-04-13 DIAGNOSIS — C3490 Malignant neoplasm of unspecified part of unspecified bronchus or lung: Secondary | ICD-10-CM | POA: Diagnosis not present

## 2019-04-13 DIAGNOSIS — Z8249 Family history of ischemic heart disease and other diseases of the circulatory system: Secondary | ICD-10-CM

## 2019-04-13 DIAGNOSIS — Z807 Family history of other malignant neoplasms of lymphoid, hematopoietic and related tissues: Secondary | ICD-10-CM | POA: Diagnosis not present

## 2019-04-13 DIAGNOSIS — R2689 Other abnormalities of gait and mobility: Principal | ICD-10-CM | POA: Diagnosis present

## 2019-04-13 DIAGNOSIS — C778 Secondary and unspecified malignant neoplasm of lymph nodes of multiple regions: Secondary | ICD-10-CM | POA: Diagnosis present

## 2019-04-13 DIAGNOSIS — R609 Edema, unspecified: Secondary | ICD-10-CM | POA: Diagnosis not present

## 2019-04-13 DIAGNOSIS — R200 Anesthesia of skin: Secondary | ICD-10-CM | POA: Diagnosis present

## 2019-04-13 DIAGNOSIS — Z515 Encounter for palliative care: Secondary | ICD-10-CM

## 2019-04-13 DIAGNOSIS — R29898 Other symptoms and signs involving the musculoskeletal system: Secondary | ICD-10-CM | POA: Diagnosis not present

## 2019-04-13 DIAGNOSIS — C7A090 Malignant carcinoid tumor of the bronchus and lung: Secondary | ICD-10-CM | POA: Diagnosis not present

## 2019-04-13 DIAGNOSIS — E46 Unspecified protein-calorie malnutrition: Secondary | ICD-10-CM | POA: Diagnosis not present

## 2019-04-13 DIAGNOSIS — Z6820 Body mass index (BMI) 20.0-20.9, adult: Secondary | ICD-10-CM | POA: Diagnosis not present

## 2019-04-13 DIAGNOSIS — K7689 Other specified diseases of liver: Secondary | ICD-10-CM | POA: Diagnosis not present

## 2019-04-13 DIAGNOSIS — C7802 Secondary malignant neoplasm of left lung: Secondary | ICD-10-CM | POA: Diagnosis present

## 2019-04-13 DIAGNOSIS — D72823 Leukemoid reaction: Secondary | ICD-10-CM | POA: Diagnosis not present

## 2019-04-13 DIAGNOSIS — K5900 Constipation, unspecified: Secondary | ICD-10-CM | POA: Diagnosis present

## 2019-04-13 DIAGNOSIS — Z823 Family history of stroke: Secondary | ICD-10-CM

## 2019-04-13 DIAGNOSIS — R269 Unspecified abnormalities of gait and mobility: Secondary | ICD-10-CM | POA: Diagnosis present

## 2019-04-13 DIAGNOSIS — G8918 Other acute postprocedural pain: Secondary | ICD-10-CM

## 2019-04-13 DIAGNOSIS — C7931 Secondary malignant neoplasm of brain: Secondary | ICD-10-CM | POA: Diagnosis not present

## 2019-04-13 DIAGNOSIS — I82551 Chronic embolism and thrombosis of right peroneal vein: Secondary | ICD-10-CM

## 2019-04-13 DIAGNOSIS — F17210 Nicotine dependence, cigarettes, uncomplicated: Secondary | ICD-10-CM | POA: Diagnosis present

## 2019-04-13 DIAGNOSIS — M21371 Foot drop, right foot: Secondary | ICD-10-CM | POA: Diagnosis not present

## 2019-04-13 DIAGNOSIS — E8809 Other disorders of plasma-protein metabolism, not elsewhere classified: Secondary | ICD-10-CM | POA: Diagnosis not present

## 2019-04-13 DIAGNOSIS — R531 Weakness: Secondary | ICD-10-CM | POA: Diagnosis present

## 2019-04-13 DIAGNOSIS — C7951 Secondary malignant neoplasm of bone: Secondary | ICD-10-CM | POA: Diagnosis not present

## 2019-04-13 DIAGNOSIS — D72829 Elevated white blood cell count, unspecified: Secondary | ICD-10-CM

## 2019-04-13 DIAGNOSIS — Z7409 Other reduced mobility: Secondary | ICD-10-CM | POA: Diagnosis present

## 2019-04-13 DIAGNOSIS — C3491 Malignant neoplasm of unspecified part of right bronchus or lung: Secondary | ICD-10-CM | POA: Diagnosis present

## 2019-04-13 MED ORDER — NALOXONE HCL 0.4 MG/ML IJ SOLN: 0.0800 mg | INTRAMUSCULAR | Status: DC | PRN

## 2019-04-13 MED ORDER — PROCHLORPERAZINE 25 MG RE SUPP: 12.5000 mg | Freq: Four times a day (QID) | RECTAL | Status: DC | PRN

## 2019-04-13 MED ORDER — FLEET ENEMA 7-19 GM/118ML RE ENEM: 1.0000 | ENEMA | Freq: Once | RECTAL | Status: DC | PRN

## 2019-04-13 MED ORDER — LEVETIRACETAM 500 MG PO TABS
500.0000 mg | ORAL_TABLET | Freq: Two times a day (BID) | ORAL | Status: DC
  Administered 2019-04-13 – 2019-04-23 (×20): 500 mg via ORAL
  Filled 2019-04-13 (×20): qty 1

## 2019-04-13 MED ORDER — PROMETHAZINE HCL 12.5 MG PO TABS
12.5000 mg | ORAL_TABLET | ORAL | Status: DC | PRN
  Filled 2019-04-13: qty 2

## 2019-04-13 MED ORDER — GABAPENTIN 300 MG PO CAPS
300.0000 mg | ORAL_CAPSULE | Freq: Three times a day (TID) | ORAL | Status: DC | PRN
  Administered 2019-04-13 – 2019-04-14 (×2): 300 mg via ORAL
  Filled 2019-04-13 (×3): qty 1

## 2019-04-13 MED ORDER — HYDROCODONE-ACETAMINOPHEN 5-325 MG PO TABS
1.0000 | ORAL_TABLET | ORAL | Status: DC | PRN
  Administered 2019-04-13 – 2019-04-16 (×12): 1 via ORAL
  Filled 2019-04-13 (×12): qty 1

## 2019-04-13 MED ORDER — DIPHENHYDRAMINE HCL 12.5 MG/5ML PO ELIX: 12.5000 mg | ORAL_SOLUTION | Freq: Four times a day (QID) | ORAL | Status: DC | PRN

## 2019-04-13 MED ORDER — ENOXAPARIN SODIUM 60 MG/0.6ML ~~LOC~~ SOLN: 60.0000 mg | SUBCUTANEOUS | Status: DC

## 2019-04-13 MED ORDER — DEXAMETHASONE 2 MG PO TABS
2.0000 mg | ORAL_TABLET | Freq: Three times a day (TID) | ORAL | Status: AC
  Administered 2019-04-13 – 2019-04-15 (×6): 2 mg via ORAL
  Filled 2019-04-13 (×6): qty 1

## 2019-04-13 MED ORDER — NICOTINE 7 MG/24HR TD PT24
7.0000 mg | MEDICATED_PATCH | Freq: Every day | TRANSDERMAL | Status: DC
  Filled 2019-04-13 (×7): qty 1

## 2019-04-13 MED ORDER — ONDANSETRON HCL 4 MG/2ML IJ SOLN: 4.0000 mg | INTRAMUSCULAR | Status: DC | PRN

## 2019-04-13 MED ORDER — DIPHENHYDRAMINE HCL 25 MG PO CAPS: 25.0000 mg | ORAL_CAPSULE | Freq: Three times a day (TID) | ORAL | Status: DC | PRN

## 2019-04-13 MED ORDER — PANTOPRAZOLE SODIUM 40 MG PO TBEC
40.0000 mg | DELAYED_RELEASE_TABLET | Freq: Every day | ORAL | Status: DC
  Administered 2019-04-13 – 2019-04-22 (×10): 40 mg via ORAL
  Filled 2019-04-13 (×10): qty 1

## 2019-04-13 MED ORDER — ACETAMINOPHEN 325 MG PO TABS: 325.0000 mg | ORAL_TABLET | ORAL | Status: DC | PRN

## 2019-04-13 MED ORDER — ENOXAPARIN SODIUM 60 MG/0.6ML ~~LOC~~ SOLN
1.0000 mg/kg | Freq: Two times a day (BID) | SUBCUTANEOUS | Status: DC
  Administered 2019-04-13 – 2019-04-16 (×7): 60 mg via SUBCUTANEOUS
  Filled 2019-04-13 (×7): qty 0.6

## 2019-04-13 MED ORDER — PROCHLORPERAZINE MALEATE 5 MG PO TABS: 5.0000 mg | ORAL_TABLET | Freq: Four times a day (QID) | ORAL | Status: DC | PRN

## 2019-04-13 MED ORDER — PROCHLORPERAZINE EDISYLATE 10 MG/2ML IJ SOLN: 5.0000 mg | Freq: Four times a day (QID) | INTRAMUSCULAR | Status: DC | PRN

## 2019-04-13 MED ORDER — SENNOSIDES-DOCUSATE SODIUM 8.6-50 MG PO TABS: 1.0000 | ORAL_TABLET | Freq: Every evening | ORAL | Status: DC | PRN

## 2019-04-13 MED ORDER — ONDANSETRON HCL 4 MG PO TABS: 4.0000 mg | ORAL_TABLET | ORAL | Status: DC | PRN

## 2019-04-13 MED ORDER — BISACODYL 10 MG RE SUPP
10.0000 mg | Freq: Every day | RECTAL | Status: DC | PRN
  Filled 2019-04-13: qty 1

## 2019-04-13 MED ORDER — ALUM & MAG HYDROXIDE-SIMETH 200-200-20 MG/5ML PO SUSP: 30.0000 mL | ORAL | Status: DC | PRN

## 2019-04-13 MED ORDER — TRAZODONE HCL 50 MG PO TABS
25.0000 mg | ORAL_TABLET | Freq: Every evening | ORAL | Status: DC | PRN
  Administered 2019-04-13 – 2019-04-20 (×5): 50 mg via ORAL
  Filled 2019-04-13 (×5): qty 1

## 2019-04-13 MED ORDER — GUAIFENESIN-DM 100-10 MG/5ML PO SYRP: 5.0000 mL | ORAL_SOLUTION | Freq: Four times a day (QID) | ORAL | Status: DC | PRN

## 2019-04-13 MED ORDER — POLYETHYLENE GLYCOL 3350 17 G PO PACK
17.0000 g | PACK | Freq: Every day | ORAL | Status: DC | PRN
  Administered 2019-04-13 – 2019-04-21 (×5): 17 g via ORAL
  Filled 2019-04-13 (×6): qty 1

## 2019-04-13 MED ORDER — ENSURE ENLIVE PO LIQD
237.0000 mL | Freq: Two times a day (BID) | ORAL | Status: DC
  Administered 2019-04-14 – 2019-04-23 (×18): 237 mL via ORAL
  Filled 2019-04-13 (×2): qty 237

## 2019-04-13 NOTE — Progress Notes (Signed)
Patient ID: [name], female   DOB: 10/21/1955, 63 y.o.   MRN: [id number] To rehab alert and oriented patient per wheelchair accompanied by NT and husband. fall precaution discussed. Oriented to unit set up.

## 2019-04-13 NOTE — Progress Notes (Signed)
Judy Heys, MD  Physician  Physical Medicine and Rehabilitation  Consult Note  Signed  Date of Service:  04/09/2019  9:44 AM      Related encounter: ED to Hosp-Admission (Discharged) from 04/01/2019 in [facility]   Show:Clear all [x] Manual[x] Template[] Copied  Added by: [x] Love, Ivan Anchors, PA-C[x] Lovorn, Jinny Blossom, MD  [] Hover for details      Physical Medicine and Rehabilitation Consult     Reason for Consult: Functional deficits due to metastatic cancer to the brain.  Referring Physician: Dr. Annette Stable     HPI: [name] is a 63 y.o. female with history of HTN, OA who  otherwise in good health who started developing right sided weakness with foot drag and difficulty lifting her arm as well as left sided chest pain under her left breast for about 2-3 weeks prior to showing up for evaluation in ED on 04/01/19. CT head done revealing large area of vasogenic edema in left frontal lobe concerning for metastatic disease. Neurology recommended MRI brain for work up and this revealed presumed solitary left frontal lobe mass with moderate vasogenic edema and extensive small vessel disease.  She was started on Keppra and IV decadron and transferred to [facility] for further management.    CT chest, abdomen and pelvis revealed 3.0 X2.4X 3.6 cm spiculated mass in medial right upper lobe compatible with primary bronchogenic neoplasm with additional bilateral pulmonary metastases in LUL and BLL, thoracic nodal metastases, left supraclavicular nodal metastasis, hepatic cyst and lytic osseous metastasis along anterior aspect of L3 vertebral body. Hem/onc--Dr. Marin Olp consulted for input and recommended stereotactic crani with resection of tumor for likely primary bronchogenic carcinoma RUL.  Radiation Onc consulted for palliiative Tx of left chest wall due to ongoing severe pain in back and left lower chest wall and plans are to start 2 week  course treatment after pathology available. Dr. Annette Stable consulted and patient underwent left frontal crani with resection of tumor on 12/11.  Pathology positive for adenocarcinoma and neurosurgery recommends holding off on other treatment for a week. Treatment options to be discussed with tumor board 12/21.    She continues to have right sided weakness with numbness and steroids being weaned off. She developed severe right calf pain on 12/13 pm and  BLE dopplers done yesterday revealing age indeterminate DVT involving right femoral, right gastrocnemius/popliteal and right popliteal veins. Dr. Marin Olp recommends continuing Lovenox for now and move to NOAC on outpatient basis? In one week?  PT/OT evaluation done yesterday revealing right sided weakness with sensory deficits affecting mobility and ADLs. She reports that she sat up in a chair for 30 minutes yesterday and nursing reports that they are taking her to BR for toileting as patient "very independent". CIR recommended due to recent functional decline.      Pt walked with therapy today 5-7 feet with 2 person assist- appeasred to be mod assist of 2, however was able to advance R leg as well as Left.   C/o severe pain in R calf- also developing a foot drop/PF contracture per PT already which she thinks is related. R calf hurts so bad it brings tears to her eyes, per pt.         Review of Systems  Constitutional: Negative for chills and fever.  HENT: Negative for hearing loss and tinnitus.   Eyes: Negative for blurred vision and double vision.  Respiratory: Positive for shortness  of breath. Negative for cough.   Cardiovascular: Positive for chest pain (Left lower rib pain radiating to the back).  Gastrointestinal: Negative for heartburn, nausea and vomiting.  Genitourinary: Negative for dysuria and urgency.  Musculoskeletal: Positive for myalgias. Negative for falls.  Skin: Negative for itching and rash.  Neurological: Positive for sensory  change, focal weakness and weakness. Negative for dizziness and headaches.  Psychiatric/Behavioral: The patient is nervous/anxious.   All other systems reviewed and are negative.         Past Medical History:  Diagnosis Date  . Bronchitis    . Hypertension    . Osteoarthritis of metacarpophalangeal (MCP) joint of left thumb    . Pelvic ring fracture ([place]) 09/2016    left           Past Surgical History:  Procedure Laterality Date  . APPLICATION OF CRANIAL NAVIGATION Left 04/05/2019    Procedure: APPLICATION OF CRANIAL NAVIGATION;  Surgeon: Earnie Larsson, MD;  Location: [facility];  Service: Neurosurgery;  Laterality: Left;  . CRANIOTOMY Left 04/05/2019    Procedure: LEFT FRONTAL CRANIOTOMY TUMOR EXCISION w/Brain Lab;  Surgeon: Earnie Larsson, MD;  Location: [facility];  Service: Neurosurgery;  Laterality: Left;  LEFT FRONTAL CRANIOTOMY TUMOR EXCISION w/Brain Lab           Family History  Problem Relation Age of Onset  . CAD Father          CABG  . Stroke Father    . Cancer Brother          lymphoma      Social History:  Married. Independent without AD PTA.  Works as a Geophysicist/field seismologist. Husband is a Forensic psychologist. She reports that she has been smoking cigarettes-- 1/2 PPD since her early 72's.  She has never used smokeless tobacco. She reports that she does not drink alcohol or use drugs.          Allergies  Allergen Reactions  . Codeine    . Lisinopril-Hydrochlorothiazide Other (See Comments)      Back pain            Medications Prior to Admission  Medication Sig Dispense Refill  . amLODipine (NORVASC) 10 MG tablet TAKE 1 TABLET (10 MG TOTAL) BY MOUTH DAILY. NEED OFFICE VISIT (Patient taking differently: Take 10 mg by mouth daily. ) 90 tablet 0  . aspirin 81 MG tablet Take 81 mg by mouth daily.      Marland Kitchen gabapentin (NEURONTIN) 300 MG capsule Take 300 mg by mouth 3 (three) times daily.       . hydrALAZINE (APRESOLINE) 25 MG tablet TAKE 1 TABLET BY MOUTH TWICE A DAY (Patient taking  differently: Take 25 mg by mouth 2 (two) times daily. ) 60 tablet 12  . ibuprofen (ADVIL) 800 MG tablet Take 800 mg by mouth 3 (three) times daily as needed for moderate pain.           Home: Home Living Family/patient expects to be discharged to:: Private residence Living Arrangements: Spouse/significant other, Parent Available Help at Discharge: Family Type of Home: House Home Access: [place]: One level Bathroom Shower/Tub: Multimedia programmer: Handicapped height Bathroom Accessibility: Yes Home Equipment: Environmental consultant - 2 wheels, [place] - single point  Functional History: Prior Function Level of Independence: Independent Comments: fully independent prior, weakness started ~2-3 weeks prior to sx Functional Status:  Mobility: Bed Mobility Overal bed mobility: Needs Assistance Bed Mobility: Supine to Sit Supine to sit: Mod  assist, HOB elevated, +2 for safety/equipment General bed mobility comments: mod assist for RLE and RUE management, light trunk elevation and steadying assist. Increased time and effort with use of bedrails to steady self once sitting EOB. Transfers Overall transfer level: Needs assistance Equipment used: 2 person hand held assist Transfers: Sit to/from Stand Sit to Stand: Mod assist, From elevated surface, +2 physical assistance, +2 safety/equipment General transfer comment: Mod assist +2 for power up, hip extension to neutral, and steadying. Pre-gait steps taken bilaterally with heavy RLE knee extension facilitation and guarding. Ambulation/Gait Ambulation/Gait assistance: Mod assist, +2 safety/equipment, +2 physical assistance Gait Distance (Feet): 4 Feet Assistive device: 2 person hand held assist Gait Pattern/deviations: Step-to pattern, Decreased step length - right, Decreased weight shift to right, Drifts right/left, Narrow base of support General Gait Details: Mod assist +2 for trunk support, weight shifting L and R. Verbal cuing  for weight shift L to progress RLE first with LLE vaulting noted to progress RLE, wait for PT RLE blocking then WB through RLE with progression of LLE. Steps x4 to get to recliner at bedside. Gait velocity: decr   ADL: ADL Overall ADL's : Needs assistance/impaired Eating/Feeding: Set up, Sitting Grooming: Set up, Sitting Upper Body Bathing: Moderate assistance, Sitting Lower Body Bathing: Moderate assistance, Sit to/from stand, Sitting/lateral leans Upper Body Dressing : Moderate assistance, Sitting Lower Body Dressing: Moderate assistance, Sit to/from stand, Sitting/lateral leans Lower Body Dressing Details (indicate cue type and reason): mod A sitting EOB to don socks. Mod A to hold RLE in figure 4 and start sock donning process Toilet Transfer: Moderate assistance, Stand-pivot, BSC Toileting- Clothing Manipulation and Hygiene: Moderate assistance, Sit to/from stand Functional mobility during ADLs: Moderate assistance, +2 for physical assistance, +2 for safety/equipment General ADL Comments: pt limited by R sided weakness and decreased activity tolerance   Cognition: Cognition Overall Cognitive Status: Within Functional Limits for tasks assessed Orientation Level: Oriented X4 Cognition Arousal/Alertness: Awake/alert Behavior During Therapy: WFL for tasks assessed/performed Overall Cognitive Status: Within Functional Limits for tasks assessed General Comments: Pt is emotional at times about current mobility/RUE and RLE function, but very motivated to return to PLOF     Blood pressure (!) 158/84, pulse (!) 54, temperature 97.6 F (36.4 C), temperature source Oral, resp. rate 20, height 5\' 7"  (1.702 m), weight 60.8 kg, SpO2 98 %. Physical Exam  Nursing note and vitals reviewed. Constitutional: She is oriented to person, place, and time. She appears well-developed and well-nourished.  Thin female. Sitting up in bed--NAD  Sitting up in bedside chair- saw her walk with PT of 2 people,  attached to monitor, so couldn't go far, but did specifically advance her own R foot; NAD; hair disheveled.  HENT:  Mouth/Throat: Oropharynx is clear and moist. No oropharyngeal exudate.  Crani incision with staples and crusted with dried blood.  In middle of top of head.  No facial asymmetry seen- facial sensation intact  Eyes:  EOMI B/L Sclera not injected B/L  Neck: No tracheal deviation present.  Cardiovascular:  RRR  Respiratory: Effort normal.  CTA B/L  GI: She exhibits no distension.  abd soft, NT, ND, (+)BS- hypoactive  Musculoskeletal:        General: No tenderness or edema.     Cervical back: Normal range of motion and neck supple.     Comments:  LUE biceps/triceps/WE/grip and finger abd 5/5 LLE- HF/KE/KF/DF/PF and EHL 5/5  RUE- 0/5 in all muscles tested as above RLE- HF 1/5, KE 2-/5, KF 2-/5,  DF 0/5, PF 2/5, EHL 2-/5    Neurological: She is alert and oriented to person, place, and time.  Speech clear. Able to follow one and two step commands without difficulty.   R calf tight with slightly increased tone in R ankle- keeps foot in PF at rest   Skin:  No skin breakdown seen- didn't look at backside  Psychiatric: Her speech is normal. Her mood appears anxious.  Affect slightly anxious       Lab Results Last 24 Hours       Results for orders placed or performed during the hospital encounter of 04/01/19 (from the past 24 hour(s))  CBC     Status: Abnormal    Collection Time: 04/09/19  6:29 AM  Result Value Ref Range    WBC 15.8 (H) 4.0 - 10.5 K/uL    RBC 4.90 3.87 - 5.11 MIL/uL    Hemoglobin 15.1 (H) 12.0 - 15.0 g/dL    HCT 44.9 36.0 - 46.0 %    MCV 91.6 80.0 - 100.0 fL    MCH 30.8 26.0 - 34.0 pg    MCHC 33.6 30.0 - 36.0 g/dL    RDW 12.3 11.5 - 15.5 %    Platelets 359 150 - 400 K/uL    nRBC 0.0 0.0 - 0.2 %  Basic metabolic panel     Status: Abnormal    Collection Time: 04/09/19  6:29 AM  Result Value Ref Range    Sodium 138 135 - 145 mmol/L    Potassium  4.1 3.5 - 5.1 mmol/L    Chloride 105 98 - 111 mmol/L    CO2 22 22 - 32 mmol/L    Glucose, Bld 99 70 - 99 mg/dL    BUN 18 8 - 23 mg/dL    Creatinine, Ser 0.52 0.44 - 1.00 mg/dL    Calcium 8.7 (L) 8.9 - 10.3 mg/dL    GFR calc non Af Amer >60 >60 mL/min    GFR calc Af Amer >60 >60 mL/min    Anion gap 11 5 - 15       Imaging Results (Last 48 hours)  VAS [place] LOWER EXTREMITY VENOUS (DVT)   Result Date: 04/09/2019  Lower Venous Study Indications: Swelling, and Pain.  Risk Factors: Surgery left craniotomy tumor on 04/05/19. Comparison Study: No priors. Performing Technologist: Oda Cogan RDMS, RVT  Examination Guidelines: A complete evaluation includes B-mode imaging, spectral Doppler, color Doppler, and power Doppler as needed of all accessible portions of each vessel. Bilateral testing is considered an integral part of a complete examination. Limited examinations for reoccurring indications may be performed as noted.  +---------+---------------+---------+-----------+----------+-----------------+ RIGHT    CompressibilityPhasicitySpontaneityPropertiesThrombus Aging    +---------+---------------+---------+-----------+----------+-----------------+ CFV      Full           Yes      Yes                                    +---------+---------------+---------+-----------+----------+-----------------+ SFJ      Full                                                           +---------+---------------+---------+-----------+----------+-----------------+ FV Prox  None  Age Indeterminate +---------+---------------+---------+-----------+----------+-----------------+ FV Mid   None                                         Age Indeterminate +---------+---------------+---------+-----------+----------+-----------------+ FV DistalNone                                         Age Indeterminate  +---------+---------------+---------+-----------+----------+-----------------+ PFV      Full                                         Age Indeterminate +---------+---------------+---------+-----------+----------+-----------------+ POP      None           No       No                   Age Indeterminate +---------+---------------+---------+-----------+----------+-----------------+ PTV      None                                         Age Indeterminate +---------+---------------+---------+-----------+----------+-----------------+ PERO     None                                         Age Indeterminate +---------+---------------+---------+-----------+----------+-----------------+ Gastroc  None                                                           +---------+---------------+---------+-----------+----------+-----------------+ Thrombus appears sub-acute.  +---------+---------------+---------+-----------+----------+--------------+ LEFT     CompressibilityPhasicitySpontaneityPropertiesThrombus Aging +---------+---------------+---------+-----------+----------+--------------+ CFV      Full           Yes      Yes                                 +---------+---------------+---------+-----------+----------+--------------+ SFJ      Full                                                        +---------+---------------+---------+-----------+----------+--------------+ FV Prox  Full                                                        +---------+---------------+---------+-----------+----------+--------------+ FV Mid   Full                                                        +---------+---------------+---------+-----------+----------+--------------+  FV DistalFull                                                        +---------+---------------+---------+-----------+----------+--------------+ PFV      Full                                                         +---------+---------------+---------+-----------+----------+--------------+ POP      Full           Yes      Yes                                 +---------+---------------+---------+-----------+----------+--------------+ PTV      Full                                                        +---------+---------------+---------+-----------+----------+--------------+ PERO     Full                                                        +---------+---------------+---------+-----------+----------+--------------+    Summary: Right: Findings consistent with age indeterminate deep vein thrombosis involving the right femoral vein, right popliteal vein, right posterior tibial veins, right peroneal veins, and right gastrocnemius veins. Left: There is no evidence of deep vein thrombosis in the lower extremity.  *See table(s) above for measurements and observations. Electronically signed by Harold Barban MD on 04/09/2019 at 8:29:58 AM.    Final          Assessment/Plan: Diagnosis: CA and R hemiplegia due to mets to brain s/p craniotomy 1. Does the need for close, 24 hr/day medical supervision in concert with the patient's rehab needs make it unreasonable for this patient to be served in a less intensive setting? Yes 2. Co-Morbidities requiring supervision/potential complications: HTN, lung CA- need for palliative radiation, spasticity, PF contracture formation 3. Due to bowel management, safety, skin/wound care, disease management, medication administration, pain management and patient education, does the patient require 24 hr/day rehab nursing? Yes 4. Does the patient require coordinated care of a physician, rehab nurse, therapy disciplines of PT/OT and possibly SLP to address physical and functional deficits in the context of the above medical diagnosis(es)? Yes Addressing deficits in the following areas: balance, endurance, locomotion, strength, transferring, bathing, dressing,  grooming, toileting, cognition and psychosocial support 5. Can the patient actively participate in an intensive therapy program of at least 3 hrs of therapy per day at least 5 days per week? Yes 6. The potential for patient to make measurable gains while on inpatient rehab is good 7. Anticipated functional outcomes upon discharge from inpatient rehab are supervision and min assist  with PT, supervision and min assist with OT, supervision with SLP. 8. Estimated rehab length of stay to reach the above  functional goals is: 14-18 days 9. Anticipated discharge destination: Home 10. Overall Rehab/Functional Prognosis: good   RECOMMENDATIONS: This patient's condition is appropriate for continued rehabilitative care in the following setting: CIR Patient has agreed to participate in recommended program. Yes Note that insurance prior authorization may be required for reimbursement for recommended care.   Comment:  1. Pt would benefit from a PRAFO that can be ordered via [employer] in orders- for RLE- to prevent foot drop and to be worn when in bed/at least nighttime to prevent Plantarflexion contracture   2. Could also benefit from Baclofen 5 mg TID for developing spasticity of RLE- that's probably cause of R calf pain- no guarantees, but likely the cause. Usually well tolerated at that low dose.        Bary Leriche, PA-C 04/09/2019        Revision History                     Routing History

## 2019-04-13 NOTE — Progress Notes (Signed)
Overall looks good today.  Awake and alert.  Pain minimal.  Still with significant right-sided weakness which is slowly improving.  Wound clean and dry.  We will remove the staples today.  Overall progressing well.  Okay for rehab transfer today.

## 2019-04-13 NOTE — Progress Notes (Signed)
Jamse Arn, MD  Physician  Physical Medicine and Rehabilitation  PMR Pre-admission  Signed  Date of Service:  04/13/2019  8:59 AM      Related encounter: ED to Hosp-Admission (Discharged) from 04/01/2019 in [place]                 PMR Admission Coordinator Pre-Admission Assessment   Patient: [name] is an 63 y.o., female MRN: [id number] DOB: 04/02/1956 Height: '5\' 7"'  (170.2 cm) Weight: 60.8 kg                                                                                                                                                  Insurance Information HMO:     PPO: yes     PCP:      IPA:      80/20:      OTHER:  PRIMARY: [employer] [place]      Policy#: [id number]      Subscriber: Patient CM Name: Merrilyn Puma      Phone#: [phone]     Fax#: [phone] Pre-Cert#: [id number]      Employer:  [employer]auf[employer] provided by Ree Shay for admit to CIR Effective dates: 12/16 with clinical updates due 12/30. Updates due to (f): [phone]; Per [place], the patient has until 12/30 to admit to CIR. Update date of 12/30 stays the same no matter what date the patient admits.  Benefits:  Phone #: [phone]     Name:  Eff. Date: 04/25/2018 - 04/25/2019     Deduct: $7,500 ($806.15 met)      Out of Pocket Max: $8,150 (if still active on 04/26/2019, will re-set, max includes deductible - $1,669.69 met)      Life Max: NA CIR: after deductible, 60% coverage, 40% co-insurance      SNF: after deductible, 60% coverage, 40% co-insurance; with a limit of 60 days/cal yr Outpatient: $115/visit co-pay; limited to 30 visits combined PT/OT rehabilitative, 30 visits combined PT/OT habilitative, 30 visits ST rehabilitative, 30 visits habilitative    Home Health: after deductible, 60% coverage, 40% co-insurance; limited by medical necessity     DME: after deductible, 60% coverage, 40% co-insurance      Providers:  SECONDARY: None      Policy#:        Subscriber:  CM Name:       Phone#:      Fax#:  Pre-Cert#:       Employer:  Benefits:  Phone #:      Name:  Eff. Date:      Deduct:       Out of Pocket Max:       Life Max CIR:       SNF:  Outpatient:      Co-Pay:  Home Health:  Co-Pay:  DME:      Co-Pay:    Medicaid Application Date:       Case Manager:  Disability Application Date:       Case Worker:    The "Data Collection Information Summary" for patients in Inpatient Rehabilitation Facilities with attached "[place] Records" was provided and verbally reviewed with: N/A   Emergency Contact Information                Contact Information     Name Relation Home Work Mobile     Dura,Eric Spouse     [phone]        Current Medical History  Patient Admitting Diagnosis: Cancer and right hemiplegia due to mets to brain s/p craniotomy.    History of Present Illness: Darnell Stimson is a 63 y.o. female with history of HTN, OA who otherwise was in good health who started developing right sided weakness with foot drag and difficulty lifting her arm as well as left sided chest pain under her left breast for about 2-3 weeks prior to showing up for evaluation in ED on 04/01/19. CT head done revealing large area of vasogenic edema in left frontal lobe concerning for metastatic disease. Neurology recommended MRI brain for work up and this revealed presumed solitary left frontal lobe mass with moderate vasogenic edema and extensive small vessel disease.  She was started on Keppra and IV decadron and transferred to [facility] for further management.    CT chest, abdomen and pelvis revealed 3.0 X2.4X 3.6 cm spiculated mass in medial right upper lobe compatible with primary bronchogenic neoplasm with additional bilateral pulmonary metastases in LUL and BLL, thoracic nodal metastases, left supraclavicular nodal metastasis, hepatic cyst and lytic osseous metastasis along anterior aspect of L3 vertebral body. Hem/onc--Dr. Marin Olp  consulted for input and recommended stereotactic crani with resection of tumor for likely primary bronchogenic carcinoma RUL.  Radiation Onc consulted for palliiative Tx of left chest wall due to ongoing severe pain in back and left lower chest wall and plans are to start 2 week course treatment after pathology available. Dr. Annette Stable consulted and patient underwent left frontal crani with resection of tumor on 12/11.  Pathology positive for adenocarcinoma and neurosurgery recommends holding off on other treatment for a week. Treatment options to be discussed with tumor board 12/21.    She continues to have right sided weakness with numbness and steroids being weaned off. She developed severe right calf pain on 12/13 pm and  BLE dopplers done revealing age indeterminate DVT involving right femoral, right gastrocnemius/popliteal and right popliteal veins. Dr. Marin Olp recommends continuing Lovenox for now. PT/OT evaluation done revealing right sided weakness with sensory deficits affecting mobility and ADLs. CIR recommended due to recent functional decline. Pt was planned to admit to CIR on 04/11/2019, However, do to increased acute pain in RLE, pt required change in pain medicine regimen and admission was held. Due to lack of bed availability, pt is to admit to CIR on 04/13/2019.        Complete NIHSS TOTAL: 10 Glasgow Coma Scale Score: 15   Past Medical History         Past Medical History:  Diagnosis Date  . Bronchitis    . Hypertension    . Osteoarthritis of metacarpophalangeal (MCP) joint of left thumb    . Pelvic ring fracture ([place]) 09/2016    left      Family History  family history includes CAD in her father; Cancer in  her brother; Stroke in her father.   Prior Rehab/Hospitalizations:  Has the patient had prior rehab or hospitalizations prior to admission? No   Has the patient had major surgery during 100 days prior to admission? Yes   Current Medications    Current  Facility-Administered Medications:  .  0.9 %  sodium chloride infusion, , Intravenous, Continuous, Pool, Mallie Mussel, MD, Last Rate: 75 mL/hr at 04/09/19 0418, New Bag at 04/09/19 0418 .  acetaminophen (TYLENOL) tablet 650 mg, 650 mg, Oral, Q4H PRN, 650 mg at 04/07/19 0943 **OR** acetaminophen (TYLENOL) suppository 650 mg, 650 mg, Rectal, Q4H PRN, Earnie Larsson, MD .  Derrill Memo ON 04/13/2019] dexamethasone (DECADRON) tablet 1 mg, 1 mg, Oral, Q8H, Bergman, Meghan D, NP .  [COMPLETED] dexamethasone (DECADRON) tablet 6 mg, 6 mg, Oral, Q6H, 6 mg at 04/06/19 1206 **FOLLOWED BY** [COMPLETED] dexamethasone (DECADRON) tablet 4 mg, 4 mg, Oral, Q6H, 4 mg at 04/07/19 1224 **FOLLOWED BY** dexamethasone (DECADRON) tablet 2 mg, 2 mg, Oral, Q8H, Bergman, Meghan D, NP, 2 mg at 04/11/19 0430 .  diphenhydrAMINE (BENADRYL) capsule 25 mg, 25 mg, Oral, Q8H PRN, Pool, Henry, MD .  enoxaparin (LOVENOX) injection 60 mg, 1 mg/kg, Subcutaneous, BID, Bergman, Meghan D, NP, 60 mg at 04/11/19 0951 .  feeding supplement (ENSURE ENLIVE) (ENSURE ENLIVE) liquid 237 mL, 237 mL, Oral, BID BM, Earnie Larsson, MD, 237 mL at 04/10/19 1827 .  gabapentin (NEURONTIN) capsule 300 mg, 300 mg, Oral, TID PRN, Earnie Larsson, MD, 300 mg at 04/09/19 0417 .  HYDROcodone-acetaminophen (NORCO/VICODIN) 5-325 MG per tablet 1 tablet, 1 tablet, Oral, Q4H PRN, Earnie Larsson, MD, 1 tablet at 04/11/19 0430 .  HYDROmorphone (DILAUDID) injection 0.5-1 mg, 0.5-1 mg, Intravenous, Q2H PRN, Earnie Larsson, MD, 1 mg at 04/11/19 0801 .  labetalol (NORMODYNE) injection 10-40 mg, 10-40 mg, Intravenous, Q10 min PRN, Earnie Larsson, MD, 20 mg at 04/06/19 1030 .  levETIRAcetam (KEPPRA) tablet 500 mg, 500 mg, Oral, BID, Ostergard, Joyice Faster, MD, 500 mg at 04/11/19 0951 .  naloxone [facility]) injection 0.08 mg, 0.08 mg, Intravenous, PRN, Earnie Larsson, MD .  nicotine (NICODERM CQ - dosed in mg/24 hr) patch 7 mg, 7 mg, Transdermal, Daily, Ostergard, Thomas A, MD .  ondansetron (ZOFRAN) tablet 4 mg, 4  mg, Oral, Q4H PRN **OR** ondansetron (ZOFRAN) injection 4 mg, 4 mg, Intravenous, Q4H PRN, Earnie Larsson, MD, 4 mg at 04/06/19 1032 .  pantoprazole (PROTONIX) EC tablet 40 mg, 40 mg, Oral, Q supper, Alvira Philips, [place], 40 mg at 04/10/19 1702 .  promethazine (PHENERGAN) tablet 12.5-25 mg, 12.5-25 mg, Oral, Q4H PRN, Earnie Larsson, MD .  senna-docusate (Senokot-S) tablet 1 tablet, 1 tablet, Oral, QHS PRN, Earnie Larsson, MD   Patients Current Diet:            Diet Order                            Diet regular Room service appropriate? 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 No AD use PTA   Prior Functional Level Prior Function Level of Independence: Independent Comments: fully independent prior, weakness started ~2-3 weeks prior to sx   Self Care: Did the patient need help bathing, dressing, using the toilet or eating?  Independent   Indoor Mobility: Did the patient need assistance with walking from room to room (with or without device)? Independent   Stairs: Did the patient need assistance with internal or external stairs (with or without device)? Independent   Functional Cognition: Did the patient need help planning regular tasks such as shopping or remembering to take medications? Independent   Home Assistive Devices / Equipment Home Assistive Devices/Equipment: Eyeglasses Home Equipment: Walker - 2 wheels, Cane - single point   Prior Device Use: Indicate devices/aids used by the patient prior to current illness, exacerbation or injury? None of the above but  because her parents live with her, she has a ramp for safe entry, vehicle lift for wc, hoyer lift, seat in shower, and a wheelchair accessible home.    Current Functional Level Cognition   Overall Cognitive Status: Within Functional Limits for tasks assessed Orientation Level: Oriented X4 General Comments: Pt states she had an emotional morning, but feels better now    Extremity Assessment (includes Sensation/Coordination)   Upper Extremity Assessment: RUE deficits/detail, LUE deficits/detail RUE Deficits / Details: trace movement in trap, deltoid, and bicep. Decreased sensation throughout- needs more detailed testing LUE Deficits / Details: WFL  Lower Extremity Assessment: Overall WFL for tasks assessed, RLE deficits/detail RLE Deficits / Details: hip flexion 2-/5, knee extension 2+/5, knee flexion 1/5, DF/PF 0/5. Hypertonicity R PF RLE Sensation: WNL RLE Coordination: decreased gross motor, decreased fine motor     ADLs   Overall ADL's : Needs assistance/impaired Eating/Feeding: Set up, Sitting Grooming: Set up, Sitting Upper Body Bathing: Moderate assistance, Sitting Lower Body Bathing: Moderate assistance, Sit to/from stand, Sitting/lateral leans Upper Body Dressing : Moderate assistance, Sitting Lower Body Dressing: Moderate assistance, Sit to/from stand, Sitting/lateral leans Lower Body Dressing Details (indicate cue type and reason): mod A sitting EOB to don socks. Mod A to hold RLE in figure 4 and start sock donning process Toilet Transfer: Moderate assistance, Stand-pivot, BSC Toileting- Clothing Manipulation and Hygiene: Moderate assistance, Sit to/from stand Functional mobility during ADLs: Moderate assistance, +2 for physical assistance, +2 for safety/equipment General ADL Comments: pt limited by R sided weakness and decreased activity tolerance     Mobility   Overal bed mobility: Needs Assistance Bed Mobility: Supine to Sit Supine to sit: Min assist Sit to supine: Min  assist General bed mobility comments: min assist for RLE and RUE management, pt with increased time to scoot to EOB but no physical assist needed.     Transfers   Overall transfer level: Needs assistance Equipment used: 1 person hand held assist Transfers: Sit to/from Stand Sit to Stand: Mod assist, +2 safety/equipment, Min assist General transfer comment: Sit to stand x4 during session due to seated rest breaks during gait training. Initially mod assist for power up, steadying, and RLE blocking, required min assist with continued repetitions.     Ambulation / Gait / Stairs / Wheelchair Mobility   Ambulation/Gait Ambulation/Gait assistance: Mod assist, +2 safety/equipment Gait Distance (Feet): 5 Feet(3x5 ft) Assistive device: 1 person hand held assist Gait Pattern/deviations: Step-to pattern, Decreased step length - right, Drifts right/left, Narrow base of support, Decreased weight shift to right, Step-through pattern, Decreased stance time - right General Gait Details: mod assist for trunk  support, physical assist with weight shift L and R, RLE blocking with RLE stance phase, and RUE support in standing.3 bouts of 5 ft ambulation, seated rest break x2 minutes between each round ambulation. Gait velocity: decr     Posture / Balance Dynamic Sitting Balance Sitting balance - Comments: able to sit EOC without imbalance Balance Overall balance assessment: Needs assistance Sitting-balance support: Feet supported, No upper extremity supported Sitting balance-Leahy Scale: Fair Sitting balance - Comments: able to sit EOC without imbalance Postural control: Posterior lean Standing balance support: Single extremity supported Standing balance-Leahy Scale: Poor Standing balance comment: reliant on external support in standing     Special needs/care consideration BiPAP/CPAP: no CPM: no Continuous Drip IV: 0.9% sodium chloride infusion  Dialysis: no        Days: no Life Vest: no Oxygen:  no Special Bed: no Trach Size: no Wound Vac (area): no      Location: no Skin: surgical incision to head                         Bowel mgmt: last BM 04/04/2019  Bladder mgmt: continent  Diabetic mgmt: no Behavioral consideration : no Chemo/radiation : Plans are to begin this in January.  Appointments per Radiation Oncology (spoke with Aldona Bar RN to confirm [phone]): -Discussion of pathology with Tumor Board on Dec 21st (pt is not present for this discussion)  -Jan 4 at 2pm schedule for MRI at [facility] -Jan 5th at [facility] 9:00am, 9:30AM, 10:00AM, 10:30AM.  -Jan 7th at [facility] 9:30AM (approx 45 min appointment)      Previous Home Environment (from acute therapy documentation) Living Arrangements: Spouse/significant other, Parent Available Help at Discharge: Family Type of Home: [place]: One level Home Access: Ramped entrance Bathroom Shower/Tub: Multimedia programmer: Handicapped height [place]: No   Discharge [place] for Discharge Living Setting: Patient's home, Lives with (comment)(husband, her mom&dad, sister in law) Type of Home at Discharge: House Discharge [place]: One level Discharge Home Access: [place] entrance Discharge Bathroom Shower/Tub: Walk-in shower(with seat (has back but no arm rests)) Discharge Bathroom Toilet: Handicapped height Discharge Bathroom Accessibility: Yes How Accessible: Accessible via wheelchair, Accessible via walker Does the patient have any problems obtaining your medications?: No   Social/Family/Support Systems Patient Roles: Spouse, Other (Comment)(owns own [place], spouse, close to parents) Sport and exercise psychologist Information: husband [name]): [phone] Anticipated Caregiver: husband and sister in law (both at home) Anticipated Caregiver's Contact Information: see above Ability/Limitations of Caregiver: Min A Caregiver Availability: 24/7 Discharge  Plan Discussed with Primary Caregiver: Yes(with pt and husband) Is Caregiver In Agreement with Plan?: Yes Does Caregiver/Family have Issues with Lodging/Transportation while Pt is in Rehab?: No     Goals/Additional Needs Patient/Family Goal for Rehab: PT/OT: Supervision/Min A; SLP: NA Expected length of stay: 10-15 days Cultural Considerations: NA Dietary Needs: regular diet, thin liquids  Equipment Needs: TBD Special Service Needs: Stage 4 cancer, may be pending chemo and radiation  Pt/Family Agrees to Admission and willing to participate: Yes Program Orientation Provided & Reviewed with Pt/Caregiver Including Roles  & Responsibilities: Yes(pt and her husband)  Barriers to Discharge: Insurance for SNF coverage, Pending chemo/radiation     Decrease burden of Care through IP rehab admission: NA     Possible need for SNF placement upon discharge:Not anticipated; pt has great social support at DC. Pt is motivated to return home with as much independence as possible and  understands the plan is to return straight home after a CIR stay with assistance as needed. Pt has an accessible environment that can support a wheelchair if needed.      Patient Condition: This patient's medical and functional status has changed since the consult dated: 04/09/2019 in which the Rehabilitation Physician determined and documented that the patient's condition is appropriate for intensive rehabilitative care in an inpatient rehabilitation facility. See "History of Present Illness" (above) for medical update. Functional changes are: slight improvement in gait distance from 4 feet to 20 feet. Patient's medical and functional status update has been discussed with the Rehabilitation physician and patient remains appropriate for inpatient rehabilitation. Will admit to inpatient rehab today. *Pt did not admit to CIR on 12/17 due to acute pain and pt/family request to hold admission. Pt's pain regime was altered and pt is to  admit to CIR on 04/13/2019, as a bed will be available for this patient.    Preadmission Screen Completed By:  Raechel Ache, OT, 04/11/2019 10:37 AM ______________________________________________________________________   Discussed status with Dr. Ranell Patrick on 04/11/2019 at 10:06AM and received approval for admission on 04/11/2019.   Admission Coordinator:  Raechel Ache, time 10:06AM/Date 04/11/2019       *Discussed status with admitting PM&R MD, Dr. Posey Pronto on 04/12/2019 at 12:00pm and received approval for admit to CIR on 04/13/2019.    Admission Coordinator: Raechel Ache, time 3:22PM/Date: 04/12/2019.

## 2019-04-13 NOTE — Progress Notes (Signed)
Report called to [name] on [place]. Pt readied for transfer, showered with husbands help, thigh high TED hose on right leg. Saline Locks removed. Patient is ready to go according to her statements. Transport via wheelchair with husband and belongings. Simmie Davies RN

## 2019-04-13 NOTE — H&P (Signed)
Physical Medicine and Rehabilitation Admission H&P    Chief Complaint  Patient presents with  . Lung cancer with mets to the brain.     HPI:  [name] is a 63 year old female with history of HTN, OA who started developing progressive right-sided weakness with foot drop, difficulty lifting her arm, left-sided chest pain and under the breast for 2 to 3 weeks prior to being evaluated in ED on 04/01/2019.  History taken from chart review and patient.  CT head performed, showing left frontal lobe vasogenic edema concerning for metastatic disease.  Neurology recommended MRI brain, which showed solitary left frontal lobe mass with moderate vasogenic edema and extensive small vessel disease.  She was started on Keppra and IV Decadron and transferred to [facility] for further management.   CT chest, abdomen and pelvis revealed 3 x 2.4 x 3.6 cm spiculated mass in medial RUL compatible with primary bronchogenic neoplasm, additional bilateral pulmonary metastasis LUL and BLL, thoracic nodal metastases, left supraclavicular nodal metastases, hepatic cyst and lytic osseous metastases along anterior aspect of L3 vertebral body.  Dr. Marin Olp was consulted for input and has been following along.  Radiation oncology consulted for palliative treatment of left chest wall due to ongoing severe pain right lower lobe.  Neurosurgery, Dr. Trenton Gammon consulted, patient underwent left craniotomy with resection of tumor on 04/05/2019. Pathology positive for adenocarcinoma and patient to be presented at tumor board next week.  She continues to have right sided weakness with numbness and steroids being weaned off.  Hospital course further complicated by calf pain on 04/07/2019.  BLE Dopplers performed and revealed revealed indeterminate DVT involving right femoral, right gastroc, popliteal  veins.  Dr. Marin Olp recommended continuing Lovenox for now and will to NOAC on outpatient basis?  She been cleared to start  anticoagulation in 1 week by neurosurgery.  IR consulted due to question of need for Port-A-Cath placement--- to hold for now.   Plans for XRT to the brain in the future.  She had significant issues with pain control requiring IV Dilaudid and Toradol, improving.  Therapy ongoing and patient limited by right sided weakness with instability as well as ongoing right calf pain with mobility.  CIR recommended due to functional decline. Please see preadmission assessment from earlier today as well.   Review of Systems  Constitutional: Positive for malaise/fatigue. Negative for chills and fever.  HENT: Negative for hearing loss and tinnitus.   Eyes: Negative for blurred vision and double vision.  Respiratory: Positive for shortness of breath.   Gastrointestinal: Negative for heartburn and nausea.  Musculoskeletal: Positive for back pain and myalgias.  Neurological: Positive for weakness. Negative for dizziness and headaches.  All other systems reviewed and are negative.     Past Medical History:  Diagnosis Date  . Bronchitis   . Hypertension   . Osteoarthritis of metacarpophalangeal (MCP) joint of left thumb   . Pelvic ring fracture ([place]) 09/2016   left    Past Surgical History:  Procedure Laterality Date  . APPLICATION OF CRANIAL NAVIGATION Left 04/05/2019   Procedure: APPLICATION OF CRANIAL NAVIGATION;  Surgeon: Earnie Larsson, MD;  Location: [place];  Service: Neurosurgery;  Laterality: Left;  . CRANIOTOMY Left 04/05/2019   Procedure: LEFT FRONTAL CRANIOTOMY TUMOR EXCISION w/Brain Lab;  Surgeon: Earnie Larsson, MD;  Location: [place];  Service: Neurosurgery;  Laterality: Left;  LEFT FRONTAL CRANIOTOMY TUMOR EXCISION w/Brain Lab    Family History  Problem Relation Age of Onset  .  CAD Father        CABG  . Stroke Father   . Cancer Brother        lymphoma    Social History:  Married. Independent without AD. Works as a Geophysicist/field seismologist. Husband is a Forensic psychologist. She reports that she has been  smoking cigarettes--1/2  PPD for past 40 years.  She has never used smokeless tobacco. She reports that she does not drink alcohol or use drugs.    Allergies  Allergen Reactions  . Codeine   . Lisinopril-Hydrochlorothiazide Other (See Comments)    Back pain   Medications Prior to Admission  Medication Sig Dispense Refill  . acetaminophen (TYLENOL) 325 MG tablet Take 2 tablets (650 mg total) by mouth every 4 (four) hours as needed for mild pain or fever (temp > 100.5). 60 tablet 0  . acetaminophen (TYLENOL) 650 MG suppository Place 1 suppository (650 mg total) rectally every 4 (four) hours as needed for mild pain (temp > 100.5). 12 suppository 0  . amLODipine (NORVASC) 10 MG tablet TAKE 1 TABLET (10 MG TOTAL) BY MOUTH DAILY. NEED OFFICE VISIT (Patient taking differently: Take 10 mg by mouth daily. ) 90 tablet 0  . aspirin 81 MG tablet Take 81 mg by mouth daily.    Marland Kitchen dexamethasone (DECADRON) 1 MG tablet Take 1 tablet (1 mg total) by mouth every 8 (eight) hours for 3 days. 9 tablet 0  . dexamethasone (DECADRON) 2 MG tablet Take 1 tablet (2 mg total) by mouth every 8 (eight) hours for 2 days. 5 tablet 0  . diphenhydrAMINE (BENADRYL) 25 mg capsule Take 1 capsule (25 mg total) by mouth every 8 (eight) hours as needed for itching. 30 capsule 0  . enoxaparin (LOVENOX) 60 MG/0.6ML injection Inject 0.6 mLs (60 mg total) into the skin 2 (two) times daily. 36 mL 0  . feeding supplement, ENSURE ENLIVE, (ENSURE ENLIVE) LIQD Take 237 mLs by mouth 2 (two) times daily between meals. 237 mL 12  . gabapentin (NEURONTIN) 300 MG capsule Take 300 mg by mouth 3 (three) times daily.     . hydrALAZINE (APRESOLINE) 25 MG tablet TAKE 1 TABLET BY MOUTH TWICE A DAY (Patient taking differently: Take 25 mg by mouth 2 (two) times daily. ) 60 tablet 12  . HYDROcodone-acetaminophen (NORCO/VICODIN) 5-325 MG tablet Take 1 tablet by mouth every 4 (four) hours as needed for moderate pain. 30 tablet 0  . ibuprofen (ADVIL) 800 MG  tablet Take 800 mg by mouth 3 (three) times daily as needed for moderate pain.     Marland Kitchen ketorolac (TORADOL) 10 MG tablet Take 1 tablet (10 mg total) by mouth every 6 (six) hours. 20 tablet 0  . levETIRAcetam (KEPPRA) 500 MG tablet Take 1 tablet (500 mg total) by mouth 2 (two) times daily. 60 tablet 0  . nicotine (NICODERM CQ - DOSED IN MG/24 HR) 7 mg/24hr patch Place 1 patch (7 mg total) onto the skin daily. 28 patch 0  . ondansetron (ZOFRAN) 4 MG tablet Take 1 tablet (4 mg total) by mouth every 4 (four) hours as needed for nausea or vomiting. 20 tablet 0  . ondansetron (ZOFRAN) 4 MG/2ML SOLN injection Inject 2 mLs (4 mg total) into the vein every 4 (four) hours as needed for nausea or vomiting. 2 mL 0  . promethazine (PHENERGAN) 12.5 MG tablet Take 1-2 tablets (12.5-25 mg total) by mouth every 4 (four) hours as needed for refractory nausea / vomiting. 30 tablet 0  Drug Regimen Review  Drug regimen was reviewed and remains appropriate with no significant issues identified  Home: Home Living Family/patient expects to be discharged to:: Private residence Living Arrangements: Spouse/significant other, Parent Available Help at Discharge: Family Type of Home: House Home Access: Ramped entrance Home Layout: One level Bathroom Shower/Tub: Multimedia programmer: Handicapped height Bathroom Accessibility: Yes Home Equipment: Environmental consultant - 2 wheels, [place] - single point   Functional History: Prior Function Level of Independence: Independent Comments: fully independent prior, weakness started ~2-3 weeks prior to sx  Functional Status:  Mobility: Bed Mobility Overal bed mobility: Needs Assistance Bed Mobility: Supine to Sit Supine to sit: Min assist Sit to supine: Min assist General bed mobility comments: min assist for RLE and RUE management, shifting weight L and R to scoot to EOB. Increased time and effort. Transfers Overall transfer level: Needs assistance Equipment used: 2 person  hand held assist Transfers: Sit to/from Stand Sit to Stand: +2 safety/equipment, Mod assist General transfer comment: sit to stand x3 due to seated rest breaks during gait, mod assist +2 via HHA for power up, steadying, blocking RLE. Ambulation/Gait Ambulation/Gait assistance: Mod assist, +2 safety/equipment Gait Distance (Feet): 20 Feet(5+5+10 ft) Assistive device: 1 person hand held assist Gait Pattern/deviations: Step-to pattern, Decreased step length - right, Drifts right/left, Narrow base of support, Decreased weight shift to right, Step-through pattern, Decreased stance time - right General Gait Details: mod assist for trunk support, physical assist with weight shift L and R, RLE blocking with RLE stance phase, and RUE support in standing. 3 trials ambulation this session, 5+5+10 ft limited by R calf pain. Emphasis on LLE progression this session, as pt reluctant to WB through RLE for larger step. Gait velocity: decr    ADL: ADL Overall ADL's : Needs assistance/impaired Eating/Feeding: Set up, Sitting Grooming: Set up, Sitting Upper Body Bathing: Moderate assistance, Sitting Lower Body Bathing: Moderate assistance, Sit to/from stand, Sitting/lateral leans Upper Body Dressing : Moderate assistance, Sitting Lower Body Dressing: Moderate assistance, Sit to/from stand, Sitting/lateral leans Lower Body Dressing Details (indicate cue type and reason): mod A sitting EOB to don socks. Mod A to hold RLE in figure 4 and start sock donning process Toilet Transfer: Moderate assistance, Stand-pivot, BSC Toileting- Clothing Manipulation and Hygiene: Moderate assistance, Sit to/from stand Functional mobility during ADLs: Moderate assistance, +2 for physical assistance, +2 for safety/equipment General ADL Comments: pt limited by R sided weakness and decreased activity tolerance  Cognition: Cognition Overall Cognitive Status: Within Functional Limits for tasks assessed Orientation Level: Oriented  X4 Cognition Arousal/Alertness: Awake/alert Behavior During Therapy: WFL for tasks assessed/performed, Flat affect Overall Cognitive Status: Within Functional Limits for tasks assessed General Comments: pt reports she is tired of laying in the bed, eager to get up  Physical Exam: Blood pressure 122/74, pulse (!) 53, temperature (!) 97.5 F (36.4 C), temperature source Oral, resp. rate 16, height 5\' 7"  (1.702 m), weight 60.8 kg, SpO2 98 %. Physical Exam  Vitals reviewed. Constitutional: She is oriented to person, place, and time. She appears well-developed.  Frail  HENT:  Head: Normocephalic and atraumatic.  Eyes: EOM are normal. Right eye exhibits no discharge. Left eye exhibits no discharge.  Neck: No tracheal deviation present. No thyromegaly present.  Respiratory: Effort normal. No stridor. No respiratory distress.  GI: She exhibits no distension.  Musculoskeletal:     Comments: Right lower extremity tenderness  Neurological: She is alert and oriented to person, place, and time.  Motor: Left upper extremity: 5/5  proximal distal Right upper extremity: Approximately 0/5, handgrip 3 -/5 with apraxia Left lower extremity: Hip flexion, knee extension 4 -/5, ankle dorsiflexion 4/5 Right lower extremity: Hip flexion, knee extension 2/5, ankle dorsiflexion 4/5 Sensation intact to light touch  Skin: Skin is warm and dry.  Psychiatric: She has a normal mood and affect. Her behavior is normal.   No results found for this or any previous visit (from the past 48 hour(s)). No results found.  Medical Problem List and Plan: 1.  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   -patient may shower  -ELOS/Goals: 14-17 days/MinA PT, OT  -PRAFO to right lower extremity to prevent plantarflexion contracture  Admit to CIR 2.  Antithrombotics: -RLE DVT/anticoagulation:  Pharmaceutical: Therapeutic Lovenox,?  Transition to NOAC after  discharge  -antiplatelet therapy: N/A 3. Pain Management: Oxycodone prn  Monitor with increased mobility 4. Mood: LCSW to follow for evaluation   -antipsychotic agents: N/A 5. Neuropsych: This patient is capable of making decisions on her own behalf. 6. Skin/Wound Care: Routine pressure relief measures 7. Fluids/Electrolytes/Nutrition: Ensure Enlive po BID for increased nutritional needs due to cancer, appreciate dietary input.  CMP ordered. 8. Stage 4 adenocarcinoma of the lung with metastases to the brain and spine:  -Post-op brain radiation to start on Jan 4th.  -Specimen sent for genetic analysis, results should take 7-10 days  Palliative care consult 9. Spasticity: Start baclofen 5mg  TID for increased tone in right lower extremity, monitor for sedation.    Bary Leriche, PA-C 04/13/2019  I have personally performed a face to face diagnostic evaluation, including, but not limited to relevant history and physical exam findings, of this patient and developed relevant assessment and plan.  Additionally, I have reviewed and concur with the physician assistant's documentation above.  Delice Lesch, MD, ABPMR  The patient's status has not changed. The original post admission physician evaluation remains appropriate, and any changes from the pre-admission screening or documentation from the acute chart are noted above.   Delice Lesch, MD, ABPMR

## 2019-04-13 NOTE — PMR Pre-admission (Signed)
PMR Admission Coordinator Pre-Admission Assessment   Patient: [name] is an 63 y.o., female MRN: [id number] DOB: March 28, 1956 Height: 5' 7" (170.2 cm) Weight: 60.8 kg                                                                                                                                                  Insurance Information HMO:     PPO: yes     PCP:      IPA:      80/20:      OTHER:  PRIMARY: [employer] [place]      Policy#: [id number]      Subscriber: Patient CM Name: Merrilyn Puma      Phone#: [phone]     Fax#: [phone] Pre-Cert#: [id number]      Employer:  [employer] provided by Ree Shay for admit to CIR Effective dates: 12/16 with clinical updates due 12/30. Updates due to (f): [phone]; Per [place], the patient has until 12/30 to admit to CIR. Update date of 12/30 stays the same no matter what date the patient admits.  Benefits:  Phone #: [phone]     Name:  Eff. Date: 04/25/2018 - 04/25/2019     Deduct: $7,500 ($806.15 met)      Out of Pocket Max: $8,150 (if still active on 04/26/2019, will re-set, max includes deductible - $1,669.69 met)      Life Max: NA CIR: after deductible, 60% coverage, 40% co-insurance      SNF: after deductible, 60% coverage, 40% co-insurance; with a limit of 60 days/cal yr Outpatient: $115/visit co-pay; limited to 30 visits combined PT/OT rehabilitative, 30 visits combined PT/OT habilitative, 30 visits ST rehabilitative, 30 visits habilitative    Home Health: after deductible, 60% coverage, 40% co-insurance; limited by medical necessity     DME: after deductible, 60% coverage, 40% co-insurance      Providers:  SECONDARY: None      Policy#:       Subscriber:  CM Name:       Phone#:      Fax#:  Pre-Cert#:       Employer:  Benefits:  Phone #:      Name:  Eff. Date:      Deduct:       Out of Pocket Max:       Life Max CIR:       SNF:  Outpatient:      Co-Pay:  Home Health:       Co-Pay:  DME:      Co-Pay:    Medicaid Application  Date:       Case Manager:  Disability Application Date:       Case Worker:    The "Data Collection Information Summary" for patients in Inpatient Rehabilitation Facilities with attached "[facility] Records" was provided and verbally reviewed with:  N/A   Emergency Contact Information         Contact Information     Name Relation Home Work Mobile    Choma,Eric Spouse     [phone]       Current Medical History  Patient Admitting Diagnosis: Cancer and right hemiplegia due to mets to brain s/p craniotomy.    History of Present Illness: [name] is a 63 y.o. female with history of HTN, OA who otherwise was in good health who started developing right sided weakness with foot drag and difficulty lifting her arm as well as left sided chest pain under her left breast for about 2-3 weeks prior to showing up for evaluation in ED on 04/01/19. CT head done revealing large area of vasogenic edema in left frontal lobe concerning for metastatic disease. Neurology recommended MRI brain for work up and this revealed presumed solitary left frontal lobe mass with moderate vasogenic edema and extensive small vessel disease.  She was started on Keppra and IV decadron and transferred to [facility] for further management.    CT chest, abdomen and pelvis revealed 3.0 X2.4X 3.6 cm spiculated mass in medial right upper lobe compatible with primary bronchogenic neoplasm with additional bilateral pulmonary metastases in LUL and BLL, thoracic nodal metastases, left supraclavicular nodal metastasis, hepatic cyst and lytic osseous metastasis along anterior aspect of L3 vertebral body. Hem/onc--Dr. Ennever consulted for input and recommended stereotactic crani with resection of tumor for likely primary bronchogenic carcinoma RUL.  Radiation Onc consulted for palliiative Tx of left chest wall due to ongoing severe pain in back and left lower chest wall and plans are to start 2 week course treatment after  pathology available. Dr. Pool consulted and patient underwent left frontal crani with resection of tumor on 12/11.  Pathology positive for adenocarcinoma and neurosurgery recommends holding off on other treatment for a week. Treatment options to be discussed with tumor board 12/21.    She continues to have right sided weakness with numbness and steroids being weaned off. She developed severe right calf pain on 12/13 pm and  BLE dopplers done revealing age indeterminate DVT involving right femoral, right gastrocnemius/popliteal and right popliteal veins. Dr. Ennever recommends continuing Lovenox for now. PT/OT evaluation done revealing right sided weakness with sensory deficits affecting mobility and ADLs. CIR recommended due to recent functional decline. Pt was planned to admit to CIR on 04/11/2019, However, do to increased acute pain in RLE, pt required change in pain medicine regimen and admission was held. Due to lack of bed availability, pt is to admit to CIR on 04/13/2019.        Complete NIHSS TOTAL: 10 Glasgow Coma Scale Score: 15   Past Medical History      Past Medical History:  Diagnosis Date  . Bronchitis    . Hypertension    . Osteoarthritis of metacarpophalangeal (MCP) joint of left thumb    . Pelvic ring fracture (HCC) 09/2016    left      Family History  family history includes CAD in her father; Cancer in her brother; Stroke in her father.   Prior Rehab/Hospitalizations:  Has the patient had prior rehab or hospitalizations prior to admission? No   Has the patient had major surgery during 100 days prior to admission? Yes   Current Medications    Current Facility-Administered Medications:  .  0.9 %  sodium chloride infusion, , Intravenous, Continuous, Pool, Henry, MD, Last Rate: 75 mL/hr at 04/09/19 0418, New Bag at 04/09/19   9458 .  acetaminophen (TYLENOL) tablet 650 mg, 650 mg, Oral, Q4H PRN, 650 mg at 04/07/19 0943 **OR** acetaminophen (TYLENOL) suppository 650 mg,  650 mg, Rectal, Q4H PRN, Earnie Larsson, MD .  Derrill Memo ON 04/13/2019] dexamethasone (DECADRON) tablet 1 mg, 1 mg, Oral, Q8H, Bergman, Meghan D, NP .  [COMPLETED] dexamethasone (DECADRON) tablet 6 mg, 6 mg, Oral, Q6H, 6 mg at 04/06/19 1206 **FOLLOWED BY** [COMPLETED] dexamethasone (DECADRON) tablet 4 mg, 4 mg, Oral, Q6H, 4 mg at 04/07/19 1224 **FOLLOWED BY** dexamethasone (DECADRON) tablet 2 mg, 2 mg, Oral, Q8H, Bergman, Meghan D, NP, 2 mg at 04/11/19 0430 .  diphenhydrAMINE (BENADRYL) capsule 25 mg, 25 mg, Oral, Q8H PRN, Pool, Henry, MD .  enoxaparin (LOVENOX) injection 60 mg, 1 mg/kg, Subcutaneous, BID, Bergman, Meghan D, NP, 60 mg at 04/11/19 0951 .  feeding supplement (ENSURE ENLIVE) (ENSURE ENLIVE) liquid 237 mL, 237 mL, Oral, BID BM, Earnie Larsson, MD, 237 mL at 04/10/19 1827 .  gabapentin (NEURONTIN) capsule 300 mg, 300 mg, Oral, TID PRN, Earnie Larsson, MD, 300 mg at 04/09/19 0417 .  HYDROcodone-acetaminophen (NORCO/VICODIN) 5-325 MG per tablet 1 tablet, 1 tablet, Oral, Q4H PRN, Earnie Larsson, MD, 1 tablet at 04/11/19 0430 .  HYDROmorphone (DILAUDID) injection 0.5-1 mg, 0.5-1 mg, Intravenous, Q2H PRN, Earnie Larsson, MD, 1 mg at 04/11/19 0801 .  labetalol (NORMODYNE) injection 10-40 mg, 10-40 mg, Intravenous, Q10 min PRN, Earnie Larsson, MD, 20 mg at 04/06/19 1030 .  levETIRAcetam (KEPPRA) tablet 500 mg, 500 mg, Oral, BID, Ostergard, Joyice Faster, MD, 500 mg at 04/11/19 0951 .  naloxone [facility]) injection 0.08 mg, 0.08 mg, Intravenous, PRN, Earnie Larsson, MD .  nicotine (NICODERM CQ - dosed in mg/24 hr) patch 7 mg, 7 mg, Transdermal, Daily, Ostergard, Thomas A, MD .  ondansetron (ZOFRAN) tablet 4 mg, 4 mg, Oral, Q4H PRN **OR** ondansetron (ZOFRAN) injection 4 mg, 4 mg, Intravenous, Q4H PRN, Earnie Larsson, MD, 4 mg at 04/06/19 1032 .  pantoprazole (PROTONIX) EC tablet 40 mg, 40 mg, Oral, Q supper, Alvira Philips, [place], 40 mg at 04/10/19 1702 .  promethazine (PHENERGAN) tablet 12.5-25 mg, 12.5-25 mg, Oral, Q4H PRN,  Earnie Larsson, MD .  senna-docusate (Senokot-S) tablet 1 tablet, 1 tablet, Oral, QHS PRN, Earnie Larsson, MD   Patients Current Diet:     Diet Order                      Diet regular Room service appropriate? 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 No AD use PTA   Prior Functional Level Prior Function Level of Independence: Independent Comments: fully independent prior, weakness started ~2-3 weeks prior to sx   Self Care: Did the patient need help bathing, dressing, using the toilet or eating?  Independent   Indoor Mobility: Did the patient need assistance with walking from room to room (with or without device)? Independent   Stairs: Did the patient need assistance with internal or external stairs (with or without device)? Independent   Functional Cognition: Did the patient need  help planning regular tasks such as shopping or remembering to take medications? Independent   Home Assistive Devices / Equipment Home Assistive Devices/Equipment: Eyeglasses Home Equipment: Walker - 2 wheels, Cane - single point   Prior Device Use: Indicate devices/aids used by the patient prior to current illness, exacerbation or injury? None of the above but because her parents live with her, she has a ramp for safe entry, vehicle lift for wc, hoyer lift, seat in shower, and a wheelchair accessible home.    Current Functional Level Cognition   Overall Cognitive Status: Within Functional Limits for tasks assessed Orientation Level: Oriented X4 General Comments: Pt states she had an  emotional morning, but feels better now    Extremity Assessment (includes Sensation/Coordination)   Upper Extremity Assessment: RUE deficits/detail, LUE deficits/detail RUE Deficits / Details: trace movement in trap, deltoid, and bicep. Decreased sensation throughout- needs more detailed testing LUE Deficits / Details: WFL  Lower Extremity Assessment: Overall WFL for tasks assessed, RLE deficits/detail RLE Deficits / Details: hip flexion 2-/5, knee extension 2+/5, knee flexion 1/5, DF/PF 0/5. Hypertonicity R PF RLE Sensation: WNL RLE Coordination: decreased gross motor, decreased fine motor     ADLs   Overall ADL's : Needs assistance/impaired Eating/Feeding: Set up, Sitting Grooming: Set up, Sitting Upper Body Bathing: Moderate assistance, Sitting Lower Body Bathing: Moderate assistance, Sit to/from stand, Sitting/lateral leans Upper Body Dressing : Moderate assistance, Sitting Lower Body Dressing: Moderate assistance, Sit to/from stand, Sitting/lateral leans Lower Body Dressing Details (indicate cue type and reason): mod A sitting EOB to don socks. Mod A to hold RLE in figure 4 and start sock donning process Toilet Transfer: Moderate assistance, Stand-pivot, BSC Toileting- Clothing Manipulation and Hygiene: Moderate assistance, Sit to/from stand Functional mobility during ADLs: Moderate assistance, +2 for physical assistance, +2 for safety/equipment General ADL Comments: pt limited by R sided weakness and decreased activity tolerance     Mobility   Overal bed mobility: Needs Assistance Bed Mobility: Supine to Sit Supine to sit: Min assist Sit to supine: Min assist General bed mobility comments: min assist for RLE and RUE management, pt with increased time to scoot to EOB but no physical assist needed.     Transfers   Overall transfer level: Needs assistance Equipment used: 1 person hand held assist Transfers: Sit to/from Stand Sit to Stand: Mod assist, +2 safety/equipment, Min  assist General transfer comment: Sit to stand x4 during session due to seated rest breaks during gait training. Initially mod assist for power up, steadying, and RLE blocking, required min assist with continued repetitions.     Ambulation / Gait / Stairs / Wheelchair Mobility   Ambulation/Gait Ambulation/Gait assistance: Mod assist, +2 safety/equipment Gait Distance (Feet): 5 Feet(3x5 ft) Assistive device: 1 person hand held assist Gait Pattern/deviations: Step-to pattern, Decreased step length - right, Drifts right/left, Narrow base of support, Decreased weight shift to right, Step-through pattern, Decreased stance time - right General Gait Details: mod assist for trunk support, physical assist with weight shift L and R, RLE blocking with RLE stance phase, and RUE support in standing.3 bouts of 5 ft ambulation, seated rest break x2 minutes between each round ambulation. Gait velocity: decr     Posture / Balance Dynamic Sitting Balance Sitting balance - Comments: able to sit EOC without imbalance Balance Overall balance assessment: Needs assistance Sitting-balance support: Feet supported, No upper extremity supported Sitting balance-Leahy Scale: Fair Sitting balance - Comments: able to sit EOC without imbalance Postural control: Posterior lean Standing balance support:   Single extremity supported Standing balance-Leahy Scale: Poor Standing balance comment: reliant on external support in standing     Special needs/care consideration BiPAP/CPAP: no CPM: no Continuous Drip IV: 0.9% sodium chloride infusion  Dialysis: no        Days: no Life Vest: no Oxygen: no Special Bed: no Trach Size: no Wound Vac (area): no      Location: no Skin: surgical incision to head                         Bowel mgmt: last BM 04/04/2019  Bladder mgmt: continent  Diabetic mgmt: no Behavioral consideration : no Chemo/radiation : Plans are to begin this in January.  Appointments per Radiation Oncology  (spoke with Samantha RN to confirm ([phone]): -Discussion of pathology with Tumor Board on Dec 21st (pt is not present for this discussion)  -Jan 4 at 2pm schedule for MRI at [facility] -[facility] 9:00am, 9:30AM, 10:00AM, 10:30AM.  -[facility]th at [facility] 9:30AM (approx 45 min appointment)      Previous Home Environment (from acute therapy documentation) Living Arrangements: Spouse/significant other, Parent Available Help at Discharge: Family Type of Home: House Home Layout: One level Home Access: Ramped entrance Bathroom Shower/Tub: Walk-in shower Bathroom Toilet: Handicapped height Bathroom Accessibility: Yes Home Care Services: No   Discharge Living Setting Plans for Discharge Living Setting: Patient's home, Lives with (comment)(husband, her mom&dad, sister in law) Type of Home at Discharge: House Discharge Home Layout: One level Discharge Home Access: Ramped entrance Discharge Bathroom Shower/Tub: Walk-in shower(with seat (has back but no arm rests)) Discharge Bathroom Toilet: Handicapped height Discharge Bathroom Accessibility: Yes How Accessible: Accessible via wheelchair, Accessible via walker Does the patient have any problems obtaining your medications?: No   Social/Family/Support Systems Patient Roles: Spouse, Other (Comment)(owns own photography company, spouse, close to parents) Contact Information: husband (Eric): [phone] Anticipated Caregiver: husband and sister in law (both at home) Anticipated Caregiver's Contact Information: see above Ability/Limitations of Caregiver: Min A Caregiver Availability: 24/7 Discharge Plan Discussed with Primary Caregiver: Yes(with pt and husband) Is Caregiver In Agreement with Plan?: Yes Does Caregiver/Family have Issues with Lodging/Transportation while Pt is in Rehab?: No     Goals/Additional Needs Patient/Family Goal for Rehab: PT/OT: Supervision/Min A; SLP: NA Expected length of stay: 10-15 days Cultural  Considerations: NA Dietary Needs: regular diet, thin liquids  Equipment Needs: TBD Special Service Needs: Stage 4 cancer, may be pending chemo and radiation  Pt/Family Agrees to Admission and willing to participate: Yes Program Orientation Provided & Reviewed with Pt/Caregiver Including Roles  & Responsibilities: Yes(pt and her husband)  Barriers to Discharge: Insurance for SNF coverage, Pending chemo/radiation     Decrease burden of Care through IP rehab admission: NA     Possible need for SNF placement upon discharge:Not anticipated; pt has great social support at DC. Pt is motivated to return home with as much independence as possible and understands the plan is to return straight home after a CIR stay with assistance as needed. Pt has an accessible environment that can support a wheelchair if needed.      Patient Condition: This patient's medical and functional status has changed since the consult dated: 04/09/2019 in which the Rehabilitation Physician determined and documented that the patient's condition is appropriate for intensive rehabilitative care in an inpatient rehabilitation facility. See "History of Present Illness" (above) for medical update. Functional changes are: slight improvement in gait distance from 4 feet to   20 feet. Patient's medical and functional status update has been discussed with the Rehabilitation physician and patient remains appropriate for inpatient rehabilitation. Will admit to inpatient rehab today. *Pt did not admit to CIR on 12/17 due to acute pain and pt/family request to hold admission. Pt's pain regime was altered and pt is to admit to CIR on 04/13/2019, as a bed will be available for this patient.    Preadmission Screen Completed By:  Kelly Gentry, OT, 04/11/2019 10:37 AM ______________________________________________________________________   Discussed status with Dr. Raulkar on 04/11/2019 at 10:06AM and received approval for admission on 04/11/2019.    Admission Coordinator:  Kelly Gentry, time 10:06AM/Date 04/11/2019      *Discussed status with admitting PM&R MD, Dr. Nekesha Font on 04/12/2019 at 12:00pm and received approval for admit to CIR on 04/13/2019.   Admission Coordinator: Kelly Gentry, time 3:22PM/Date: 04/12/2019. 

## 2019-04-14 ENCOUNTER — Inpatient Hospital Stay (HOSPITAL_COMMUNITY): Payer: BC Managed Care – PPO | Admitting: Occupational Therapy

## 2019-04-14 ENCOUNTER — Inpatient Hospital Stay (HOSPITAL_COMMUNITY): Payer: BC Managed Care – PPO

## 2019-04-14 DIAGNOSIS — C7931 Secondary malignant neoplasm of brain: Secondary | ICD-10-CM

## 2019-04-14 DIAGNOSIS — E46 Unspecified protein-calorie malnutrition: Secondary | ICD-10-CM

## 2019-04-14 DIAGNOSIS — E8809 Other disorders of plasma-protein metabolism, not elsewhere classified: Secondary | ICD-10-CM

## 2019-04-14 DIAGNOSIS — D72829 Elevated white blood cell count, unspecified: Secondary | ICD-10-CM

## 2019-04-14 DIAGNOSIS — I82461 Acute embolism and thrombosis of right calf muscular vein: Secondary | ICD-10-CM

## 2019-04-14 DIAGNOSIS — C3490 Malignant neoplasm of unspecified part of unspecified bronchus or lung: Secondary | ICD-10-CM

## 2019-04-14 DIAGNOSIS — D72828 Other elevated white blood cell count: Secondary | ICD-10-CM

## 2019-04-14 LAB — CBC WITH DIFFERENTIAL/PLATELET
Abs Immature Granulocytes: 0.18 10*3/uL — ABNORMAL HIGH (ref 0.00–0.07)
Basophils Absolute: 0 10*3/uL (ref 0.0–0.1)
Basophils Relative: 0 %
Eosinophils Absolute: 0 10*3/uL (ref 0.0–0.5)
Eosinophils Relative: 0 %
HCT: 38.6 % (ref 36.0–46.0)
Hemoglobin: 13.1 g/dL (ref 12.0–15.0)
Immature Granulocytes: 1 %
Lymphocytes Relative: 11 %
Lymphs Abs: 1.6 10*3/uL (ref 0.7–4.0)
MCH: 30.8 pg (ref 26.0–34.0)
MCHC: 33.9 g/dL (ref 30.0–36.0)
MCV: 90.6 fL (ref 80.0–100.0)
Monocytes Absolute: 1 10*3/uL (ref 0.1–1.0)
Monocytes Relative: 7 %
Neutro Abs: 12.7 10*3/uL — ABNORMAL HIGH (ref 1.7–7.7)
Neutrophils Relative %: 81 %
Platelets: 414 10*3/uL — ABNORMAL HIGH (ref 150–400)
RBC: 4.26 MIL/uL (ref 3.87–5.11)
RDW: 12.9 % (ref 11.5–15.5)
WBC: 15.6 10*3/uL — ABNORMAL HIGH (ref 4.0–10.5)
nRBC: 0 % (ref 0.0–0.2)

## 2019-04-14 LAB — COMPREHENSIVE METABOLIC PANEL
ALT: 46 U/L — ABNORMAL HIGH (ref 0–44)
AST: 28 U/L (ref 15–41)
Albumin: 2.6 g/dL — ABNORMAL LOW (ref 3.5–5.0)
Alkaline Phosphatase: 122 U/L (ref 38–126)
Anion gap: 10 (ref 5–15)
BUN: 15 mg/dL (ref 8–23)
CO2: 22 mmol/L (ref 22–32)
Calcium: 8.3 mg/dL — ABNORMAL LOW (ref 8.9–10.3)
Chloride: 106 mmol/L (ref 98–111)
Creatinine, Ser: 0.53 mg/dL (ref 0.44–1.00)
GFR calc Af Amer: >60 mL/min (ref >60)
GFR calc non Af Amer: >60 mL/min (ref >60)
Glucose, Bld: 114 mg/dL — ABNORMAL HIGH (ref 70–99)
Potassium: 4.6 mmol/L (ref 3.5–5.1)
Sodium: 138 mmol/L (ref 135–145)
Total Bilirubin: 0.3 mg/dL (ref 0.3–1.2)
Total Protein: 5.9 g/dL — ABNORMAL LOW (ref 6.5–8.1)

## 2019-04-14 MED ORDER — BACLOFEN 5 MG HALF TABLET
5.0000 mg | ORAL_TABLET | Freq: Three times a day (TID) | ORAL | Status: DC
  Administered 2019-04-14 – 2019-04-23 (×27): 5 mg via ORAL
  Filled 2019-04-14 (×27): qty 1

## 2019-04-14 NOTE — Evaluation (Signed)
Occupational Therapy Assessment and Plan  Patient Details  Name: [name] MRN: [id number] Date of Birth: [birth year]  OT Diagnosis: hemiplegia affecting dominant side, lumbago (low back pain) and muscle weakness (generalized) Rehab Potential: Rehab Potential (ACUTE ONLY): Good ELOS: 7-10 days    Today's Date: 04/14/2019 OT Individual Time: 1062-6948 and 5462-7035 OT Individual Time Calculation (min): 60 min and 61 min  Problem List:  Patient Active Problem List   Diagnosis Date Noted  . Leucocytosis   . Hypoalbuminemia due to protein-calorie malnutrition ([place])   . Lung cancer metastatic to brain ([place]) 04/13/2019  . Adenocarcinoma, lung ([place])   . Postoperative pain   . Spasticity   . Acute deep vein thrombosis (DVT) of calf muscle vein of right lower extremity ([place])   . Adenocarcinoma of lung, stage 4, right ([place]) 04/11/2019  . Metastatic cancer to brain ([place]) 04/05/2019  . Cancer, metastatic to bone ([place]) 04/03/2019  . Brain mass 04/01/2019  . Intermittent explosive disorder 06/13/2016  . Chest pain 01/11/2013  . HYPERCHOLESTEROLEMIA WITH HIGH HDL 04/08/2009  . Essential hypertension 04/08/2009  . TOBACCO ABUSE 11/20/2008    Past Medical History:  Past Medical History:  Diagnosis Date  . Bronchitis   . Hypertension   . Osteoarthritis of metacarpophalangeal (MCP) joint of left thumb   . Pelvic ring fracture ([place]) 09/2016   left   Past Surgical History:  Past Surgical History:  Procedure Laterality Date  . APPLICATION OF CRANIAL NAVIGATION Left 04/05/2019   Procedure: APPLICATION OF CRANIAL NAVIGATION;  Surgeon: Earnie Larsson, MD;  Location: [place];  Service: Neurosurgery;  Laterality: Left;  . CRANIOTOMY Left 04/05/2019   Procedure: LEFT FRONTAL CRANIOTOMY TUMOR EXCISION w/Brain Lab;  Surgeon: Earnie Larsson, MD;  Location: [place];  Service: Neurosurgery;  Laterality: Left;  LEFT FRONTAL CRANIOTOMY TUMOR EXCISION w/Brain Lab    Assessment & Plan Clinical Impression:   [name] is a 63 year old female with history of HTN, OA who started developing progressive right-sided weakness with foot drop, difficulty lifting her arm, left-sided chest pain and under the breast for 2 to 3 weeks prior to being evaluated in ED on 04/01/2019.  History taken from chart review and patient.  CT head performed, showing left frontal lobe vasogenic edema concerning for metastatic disease.  Neurology recommended MRI brain, which showed solitary left frontal lobe mass with moderate vasogenic edema and extensive small vessel disease.  She was started on Keppra and IV Decadron and transferred to [facility] for further management.   CT chest, abdomen and pelvis revealed 3 x 2.4 x 3.6 cm spiculated mass in medial RUL compatible with primary bronchogenic neoplasm, additional bilateral pulmonary metastasis LUL and BLL, thoracic nodal metastases, left supraclavicular nodal metastases, hepatic cyst and lytic osseous metastases along anterior aspect of L3 vertebral body.  Dr. Marin Olp was consulted for input and has been following along.  Radiation oncology consulted for palliative treatment of left chest wall due to ongoing severe pain right lower lobe.  Neurosurgery, Dr. Trenton Gammon consulted, patient underwent left craniotomy with resection of tumor on 04/05/2019. Pathology positive for adenocarcinoma and patient to be presented at tumor board next week.  She continues to have right sided weakness with numbness and steroids being weaned off.  Hospital course further complicated by calf pain on 04/07/2019.  BLE Dopplers performed and revealed revealed indeterminate DVT involving right femoral, right gastroc, popliteal  veins.  Dr. Marin Olp recommended continuing Lovenox for now and will to NOAC on outpatient basis?  She been cleared to start anticoagulation in 1 week by neurosurgery.  IR consulted due to question of need for Port-A-Cath placement--- to hold for now.   Plans for XRT to the brain in  the future.  She had significant issues with pain control requiring IV Dilaudid and Toradol, improving.  Therapy ongoing and patient limited by right sided weakness with instability as well as ongoing right calf pain with mobility.  CIR recommended due to functional decline. Please see preadmission assessment from earlier today as well.   Patient currently requires mod with basic self-care skills secondary to muscle weakness and muscle paralysis, decreased cardiorespiratoy endurance, abnormal tone, unbalanced muscle activation and decreased coordination and decreased standing balance, decreased postural control and hemiplegia.  Prior to hospitalization, patient could complete BADLs with independent .  Patient will benefit from skilled intervention to increase independence with basic self-care skills prior to discharge home with family.  Anticipate patient will require 24 hour supervision and follow up home health.  OT - End of Session Endurance Deficit: Yes Endurance Deficit Description: Requires rest breaks between all activities OT Assessment Rehab Potential (ACUTE ONLY): Good OT Barriers to Discharge: Medical stability;Pending chemo/radiation OT Patient demonstrates impairments in the following area(s): Balance;Endurance;Motor;Pain OT Basic ADL's Functional Problem(s): Grooming;Bathing;Dressing;Toileting OT Advanced ADL's Functional Problem(s): Simple Meal Preparation OT Transfers Functional Problem(s): Toilet;Tub/Shower OT Additional Impairment(s): Fuctional Use of Upper Extremity OT Plan OT Intensity: Minimum of 1-2 x/day, 45 to 90 minutes OT Frequency: 5 out of 7 days OT Duration/Estimated Length of Stay: 7-10 days OT Treatment/Interventions: Balance/vestibular training;Discharge planning;Pain management;Self Care/advanced ADL retraining;Therapeutic Activities;UE/LE Coordination activities;Therapeutic Exercise;Patient/family education;Functional mobility training;Disease  mangement/prevention;Community reintegration;DME/adaptive equipment instruction;Neuromuscular re-education;Psychosocial support;Splinting/orthotics;UE/LE Strength taining/ROM;Wheelchair propulsion/positioning OT Self Feeding Anticipated Outcome(s): No goal OT Basic Self-Care Anticipated Outcome(s): Supervision-Min A OT Toileting Anticipated Outcome(s): Min A OT Bathroom Transfers Anticipated Outcome(s): Min A OT Recommendation Recommendations for Other Services: Neuropsych consult Patient destination: Home Follow Up Recommendations: Home health OT Equipment Recommended: To be determined  Skilled Therapeutic Intervention Skilled OT session completed with focus on initial evaluation, education on OT role/POC, and establishment of patient-centered goals.   Pt greeted in bed, just finished breakfast and was ready to go. She said she showered yesterday on acute and only wanted to sponge bathe today. Bathing/dressing tasks completed EOB sit<stand using RW. Note that pt has grasp/release abilities in her Rt hand but no movement noted proximally. She was able to maintain Rt hand grip on RW with min facilitation in standing, mod facilitation during short distance ambulation with RW due to wrist flaccidity. Mod A for sit<stands and also for dynamic standing balance due to trunk sway. She was able to keep Rt knee stable in standing. Vcs for lifting vs shuffling Rt foot during ambulation with device. Pt completed stand pivot<w/c<toilet with Min-Mod A. Min A for self care tasks overall using hemi strategies with pt able to obtain figure 4 position bilaterally herself. At end of session, she ambulated from sink back to her bed in manner as stated above. Left her with all needs within reach and bed alarm set.   2nd Session 1:1 tx (61 min) Pt greeted in recliner with spouse [name] present. They both expressed interest in HEPs for the room so pt could further progress her Rt arm. First provided pt with paper handout  of P/AAROM exercises and we reviewed and completed them together with spouse. Discussed joint protection, gentle stretching in end ranges, and avoidance of pain. Eric had hands on practice with providing a  shoulder external rotation stretch for her. Also gave pt a pink foam sponge, education emphasis placed on mindful grasp and release to balance flexor/extensor abilities. Provided another paper handout covering hemiplegic positioning and we all discussed this together as well. At end of session pt remained in recliner with spouse, both very grateful of education and HEPs.   OT Evaluation Precautions/Restrictions  Precautions Precautions: Fall Precaution Comments: Lt crani, pathologic fx L3 General Chart Reviewed: Yes Family/Caregiver Present: No Pain: intermittently in lower back. Pt repositioned herself during tx for improved comfort. RN provided pain medicine during session as well Pain Assessment Pain Scale: 0-10 Pain Score: 5  Pain Type: Acute pain Pain Location: Leg Pain Orientation: Right Pain Descriptors / Indicators: Aching Pain Frequency: Constant Pain Onset: On-going Home Living/Prior Functioning Home Living Family/patient expects to be discharged to:: Private residence Available Help at Discharge: Family(Simultaneous filing. User may not have seen previous data.) Type of Home: House(Simultaneous filing. User may not have seen previous data.) Home Access: Ramped entrance(Simultaneous filing. User may not have seen previous data.) Home Layout: One level(Simultaneous filing. User may not have seen previous data.) Bathroom Shower/Tub: Walk-in shower(Simultaneous filing. User may not have seen previous data.) Bathroom Toilet: Handicapped height(Simultaneous filing. User may not have seen previous data.) Bathroom Accessibility: Yes Additional Comments: Patient and her husband care for her father and mother at home  Lives With: Significant other, Family(spouse + sister-in-law   Simultaneous filing. 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 Prior Function Level of Independence: Independent with basic ADLs, Independent with homemaking with ambulation, Independent with gait, Independent with transfers  Able to Take Stairs?: Yes Driving: Yes Vocation: Full time employment Vocation Requirements: photographer Comments: fully independent prior, weakness started ~2-3 weeks prior to sx ADL ADL Eating: Not assessed Grooming: Moderate assistance Where Assessed-Grooming: Sitting at sink Upper Body Bathing: Moderate assistance Where Assessed-Upper Body Bathing: Edge of bed Lower Body Bathing: Minimal assistance Where Assessed-Lower Body Bathing: Edge of bed Upper Body Dressing: Moderate assistance(button up shirt) Where Assessed-Upper Body Dressing: Edge of bed Lower Body Dressing: Minimal assistance Where Assessed-Lower Body Dressing: Edge of bed Toileting: Not assessed Toilet Transfer: Moderate assistance Toilet Transfer Method: Stand pivot Toilet Transfer Equipment: Energy manager: Not assessed Vision Baseline Vision/History: Wears glasses Wears Glasses: Reading only Patient Visual Report: No change from baseline Vision Assessment?: No apparent visual deficits Tracking/Visual Pursuits: Able to track stimulus in all quads without difficulty Convergence: Within functional limits Perception  Perception: Within Functional Limits Praxis Praxis: Intact Cognition Overall Cognitive Status: Within Functional Limits for tasks assessed Arousal/Alertness: Awake/alert Orientation Level: Person;Place;Situation Person: Oriented Place: Oriented Situation:  Oriented Year: 2020 Month: December Day of Week: Correct Memory: Appears intact Immediate Memory Recall: Sock;Blue;Bed Memory Recall Sock: Without Cue Memory Recall Blue: Without Cue Memory Recall Bed: Without Cue Attention: Focused;Sustained Focused Attention: Appears intact Sustained Attention: Appears intact Awareness: Appears intact Problem Solving: Appears intact Safety/Judgment: Appears intact Sensation Sensation Light Touch: Appears Intact Central sensation comments: Patient reports tingling in R fingers and hand Peripheral sensation comments: Patient reports tingling at incision site on her head Light Touch Impaired Details: Impaired RUE Proprioception: Appears Intact Coordination Gross Motor Movements are Fluid and Coordinated: No Fine Motor Movements are Fluid and Coordinated: No Coordination and  Movement Description: R hemi UE>LE, decreased tone in R UE Finger Nose Finger Test: Unable to test on R due to weakness Heel Shin Test: Unable to test on R due to hip flexor weakness Motor  Motor Motor: Abnormal tone;Hemiplegia;Abnormal postural alignment and control Mobility  Mod A stand pivot toilet transfer  Trunk/Postural Assessment  Cervical Assessment Cervical Assessment: Within Functional Limits Thoracic Assessment Thoracic Assessment: Within Functional Limits Lumbar Assessment Lumbar Assessment: Within Functional Limits Postural Control Postural Control: (impaired during self care tasks with dynamic standing demands)  Balance Balance Balance Assessed: Yes Dynamic Sitting Balance Sitting balance - Comments: supervision assist for dynamic sitting during LB dressing tasks, Mod A for dynamic balance during standing portions of dressing tasks Extremity/Trunk Assessment RUE Assessment RUE Assessment: Exceptions to [facility] General Strength Comments: functional grasp/release, decreased tone in the rest of her limb LUE Assessment Active Range of Motion (AROM)  Comments: WNL General Strength Comments: 3+/5 grossly     Refer to Care Plan for Long Term Goals  Recommendations for other services: Neuropsych and Therapeutic Recreation  Other leisure pursuits   Discharge Criteria: Patient will be discharged from OT if patient refuses treatment 3 consecutive times without medical reason, if treatment goals not met, if there is a change in medical status, if patient makes no progress towards goals or if patient is discharged from hospital.  The above assessment, treatment plan, treatment alternatives and goals were discussed and mutually agreed upon: by patient  Skeet Simmer 04/14/2019, 12:51 PM

## 2019-04-14 NOTE — Progress Notes (Signed)
[place] PHYSICAL MEDICINE & REHABILITATION PROGRESS NOTE  Subjective/Complaints: Patient seen laying in bed this AM.  She states she slept well overnight. She is eager to begin therapies.   ROS: Denies CP, SOB, N/V/D  Objective: Vital Signs: Blood pressure 105/81, pulse (!) 54, temperature 97.6 F (36.4 C), temperature source Oral, resp. rate 16, height 5\' 7"  (1.702 m), weight 59.3 kg, SpO2 98 %. No results found. Recent Labs    04/14/19 1013  WBC 15.6*  HGB 13.1  HCT 38.6  PLT 414*   Recent Labs    04/14/19 1013  NA 138  K 4.6  CL 106  CO2 22  GLUCOSE 114*  BUN 15  CREATININE 0.53  CALCIUM 8.3*    Physical Exam: BP 105/81 (BP Location: Left Arm)   Pulse (!) 54   Temp 97.6 F (36.4 C) (Oral)   Resp 16   Ht 5\' 7"  (1.702 m)   Wt 59.3 kg   SpO2 98%   BMI 20.48 kg/m  Constitutional: No distress . Vital signs reviewed. Frail.  HENT: Normocephalic.  Atraumatic. Eyes: EOMI. No discharge. Cardiovascular: No JVD. Respiratory: Normal effort.  No stridor. GI: Non-distended. Skin: Warm and dry.  Intact. Psych: Normal mood.  Normal behavior. Musc: Right lower extremity with tenderness. Neurological:  Alert Motor: Left upper extremity: 5/5 proximal distal Right upper extremity: Proximally 0/5, handgrip 4-/5 with apraxia Left lower extremity: Hip flexion, knee extension 4 -/5, ankle dorsiflexion 4/5 Right lower extremity: Hip flexion, knee extension 2/5, ankle dorsiflexion 4/5  Assessment/Plan: 1. Functional deficits secondary to brain and spine metastatic disease which require 3+ hours per day of interdisciplinary therapy in a comprehensive inpatient rehab setting.  Physiatrist is providing close team supervision and 24 hour management of active medical problems listed below.  Physiatrist and rehab team continue to assess barriers to discharge/monitor patient progress toward functional and medical goals  Care Tool:  Bathing    Body parts bathed by patient:  Chest, Abdomen, Front perineal area, Right upper leg, Buttocks, Left upper leg, Right lower leg, Left lower leg, Face   Body parts bathed by helper: Right arm, Left arm     Bathing assist Assist Level: Minimal Assistance - Patient > 75%     Upper Body Dressing/Undressing Upper body dressing   What is the patient wearing?: Button up shirt    Upper body assist Assist Level: Moderate Assistance - Patient 50 - 74%    Lower Body Dressing/Undressing Lower body dressing      What is the patient wearing?: Pants     Lower body assist Assist for lower body dressing: Minimal Assistance - Patient > 75%     Toileting Toileting Toileting Activity did not occur (Clothing management and hygiene only): N/A (no void or bm)  Toileting assist Assist for toileting: Moderate Assistance - Patient 50 - 74%     Transfers Chair/bed transfer  Transfers assist           Locomotion Ambulation   Ambulation assist              Walk 10 feet activity   Assist           Walk 50 feet activity   Assist           Walk 150 feet activity   Assist           Walk 10 feet on uneven surface  activity   Assist  Wheelchair     Assist               Wheelchair 50 feet with 2 turns activity    Assist            Wheelchair 150 feet activity     Assist            Medical Problem List and Plan: 1.  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   Begin CIR evalutation  PRAFO to right lower extremity to prevent plantarflexion contracture 2.  Antithrombotics: -RLE DVT/anticoagulation:  Pharmaceutical: Therapeutic Lovenox,?  Transition to NOAC after discharge             -antiplatelet therapy: N/A 3. Pain Management: Oxycodone prn             Monitor with increased mobility 4. Mood: LCSW to follow for evaluation              -antipsychotic agents: N/A 5. Neuropsych: This  patient is capable of making decisions on her own behalf. 6. Skin/Wound Care: Routine pressure relief measures 7. Fluids/Electrolytes/Nutrition: Ensure Enlive po BID for increased nutritional needs due to cancer/hypoalbuminemia, appreciate dietary input.  BMP within acceptable range on 12/20 8. Stage 4 adenocarcinoma of the lung with metastases to the brain and spine:             -Post-op brain radiation to start on Jan 4th.             -Specimen sent for genetic analysis, results should take 7-10 days             Palliative care consulted 9. Spasticity:   Baclofen 5mg  TID for increased tone in right lower extremity, monitor for sedation.  10.  Leukocytosis-likely related to CA  WBCs 15.6 on 12/20  Afebrile  Continue to monitor   LOS: 1 days A FACE TO FACE EVALUATION WAS PERFORMED  Gustav Knueppel Lorie Phenix 04/14/2019, 11:28 AM

## 2019-04-14 NOTE — Progress Notes (Signed)
Per patient staples were removed last Wednesday on the acute floor. Incision dry and intact

## 2019-04-14 NOTE — Evaluation (Signed)
Physical Therapy Assessment and Plan  Patient Details  Name: [name] MRN: [id number] Date of Birth: 06/29/1955  PT Diagnosis: Abnormal posture, Abnormality of gait, Difficulty walking, Edema, Hemiplegia dominant, Hypotonia, Impaired sensation and Muscle weakness Rehab Potential: Good ELOS: 7-10 days   Today's Date: 04/14/2019 PT Individual Time: 1110-1210 PT Individual Time Calculation (min): 60 min    Problem List:  Patient Active Problem List   Diagnosis Date Noted  . Leucocytosis   . Hypoalbuminemia due to protein-calorie malnutrition ([place])   . Lung cancer metastatic to brain ([place]) 04/13/2019  . Adenocarcinoma, lung ([place])   . Postoperative pain   . Spasticity   . Acute deep vein thrombosis (DVT) of calf muscle vein of right lower extremity ([place])   . Adenocarcinoma of lung, stage 4, right ([place]) 04/11/2019  . Metastatic cancer to brain ([place]) 04/05/2019  . Cancer, metastatic to bone ([place]) 04/03/2019  . Brain mass 04/01/2019  . Intermittent explosive disorder 06/13/2016  . Chest pain 01/11/2013  . HYPERCHOLESTEROLEMIA WITH HIGH HDL 04/08/2009  . Essential hypertension 04/08/2009  . TOBACCO ABUSE 11/20/2008    Past Medical History:  Past Medical History:  Diagnosis Date  . Bronchitis   . Hypertension   . Osteoarthritis of metacarpophalangeal (MCP) joint of left thumb   . Pelvic ring fracture ([place]) 09/2016   left   Past Surgical History:  Past Surgical History:  Procedure Laterality Date  . APPLICATION OF CRANIAL NAVIGATION Left 04/05/2019   Procedure: APPLICATION OF CRANIAL NAVIGATION;  Surgeon: Earnie Larsson, MD;  Location: [facility];  Service: Neurosurgery;  Laterality: Left;  . CRANIOTOMY Left 04/05/2019   Procedure: LEFT FRONTAL CRANIOTOMY TUMOR EXCISION w/Brain Lab;  Surgeon: Earnie Larsson, MD;  Location: [facility];  Service: Neurosurgery;  Laterality: Left;  LEFT FRONTAL CRANIOTOMY TUMOR EXCISION w/Brain Lab    Assessment & Plan Clinical Impression: Patient is a 63  y.o. year old female with history of HTN, OA who started developing progressive right-sided weakness with foot drop, difficulty lifting her arm, left-sided chest pain and under the breast for 2 to 3 weeks prior to being evaluated in ED on 04/01/2019.  History taken from chart review and patient.  CT head performed, showing left frontal lobe vasogenic edema concerning for metastatic disease.  Neurology recommended MRI brain, which showed solitary left frontal lobe mass with moderate vasogenic edema and extensive small vessel disease.  She was started on Keppra and IV Decadron and transferred to [facility] for further management.   CT chest, abdomen and pelvis revealed 3 x 2.4 x 3.6 cm spiculated mass in medial RUL compatible with primary bronchogenic neoplasm, additional bilateral pulmonary metastasis LUL and BLL, thoracic nodal metastases, left supraclavicular nodal metastases, hepatic cyst and lytic osseous metastases along anterior aspect of L3 vertebral body.  Dr. Marin Olp was consulted for input and has been following along.  Radiation oncology consulted for palliative treatment of left chest wall due to ongoing severe pain right lower lobe.  Neurosurgery, Dr. Trenton Gammon consulted, patient underwent left craniotomy with resection of tumor on 04/05/2019. Pathology positive for adenocarcinoma and patient to be presented at tumor board next week.  She continues to have right sided weakness with numbness and steroids being weaned off.  Hospital course further complicated by calf pain on 04/07/2019.  BLE Dopplers performed and revealed revealed indeterminate DVT involving right femoral, right gastroc, popliteal  veins.  Dr. Marin Olp recommended continuing Lovenox for now and will to NOAC on outpatient basis?  She been cleared to start  anticoagulation in 1 week by neurosurgery.  IR consulted due to question of need for Port-A-Cath placement--- to hold for now.   Plans for XRT to the brain in the future.  She  had significant issues with pain control requiring IV Dilaudid and Toradol, improving.  Therapy ongoing and patient limited by right sided weakness with instability as well as ongoing right calf pain with mobility.  CIR recommended due to functional decline. Please see preadmission assessment from earlier today as well. Patient transferred to CIR on 04/13/2019 .   Patient currently requires mod with mobility secondary to muscle weakness, decreased cardiorespiratoy endurance, abnormal tone, unbalanced muscle activation and decreased coordination and decreased sitting balance, decreased standing balance, decreased postural control, hemiplegia and decreased balance strategies.  Prior to hospitalization, patient was independent  with mobility and lived with Significant other, Family(spouse + sister-in-law  Simultaneous filing. User may not have seen previous data.) in a House(Simultaneous filing. User may not have seen previous data.) home.  Home access is  Ramped entrance(Simultaneous filing. User may not have seen previous data.).  Patient will benefit from skilled PT intervention to maximize safe functional mobility, minimize fall risk and decrease caregiver burden for planned discharge home with 24 hour assist.  Anticipate patient will benefit from follow up [facility] at discharge.  PT - End of Session Activity Tolerance: Tolerates 30+ min activity with multiple rests Endurance Deficit: Yes Endurance Deficit Description: Requires rest breaks between all activities PT Assessment Rehab Potential (ACUTE/IP ONLY): Good PT Barriers to Discharge: Decreased caregiver support;Pending chemo/radiation PT Barriers to Discharge Comments: Patient's husband is caring for the patient's mother and father at home, plan to place father in a long term care facility soon PT Patient demonstrates impairments in the following area(s): Balance;Edema;Endurance;Motor;Nutrition;Pain;Perception;Safety;Sensory;Skin Integrity PT  Transfers Functional Problem(s): Bed Mobility;Bed to Chair;Car;Furniture;Floor PT Locomotion Functional Problem(s): Ambulation;Wheelchair Mobility;Stairs PT Plan PT Intensity: Minimum of 1-2 x/day ,45 to 90 minutes PT Frequency: 5 out of 7 days PT Duration Estimated Length of Stay: 7-10 days PT Treatment/Interventions: Ambulation/gait training;Discharge planning;Functional mobility training;Psychosocial support;Therapeutic Activities;Visual/perceptual remediation/compensation;Balance/vestibular training;Disease management/prevention;Neuromuscular re-education;Skin care/wound management;Therapeutic Exercise;Wheelchair propulsion/positioning;DME/adaptive equipment instruction;Pain management;Splinting/orthotics;UE/LE Strength taining/ROM;Community reintegration;Functional electrical stimulation;Patient/family education;Stair training;UE/LE Coordination activities PT Transfers Anticipated Outcome(s): min A using LRAD PT Locomotion Anticipated Outcome(s): CGA using LRAD 100 ft PT Recommendation Recommendations for Other Services: Neuropsych consult;Therapeutic Recreation consult Therapeutic Recreation Interventions: Stress management Follow Up Recommendations: Home health PT Patient destination: Home Equipment Recommended: To be determined Equipment Details: Patient has RW, will assess w/c and other equipment needs throughout stay as patient progresses  Skilled Therapeutic Intervention In addition to the PT evaluation below, the patient performed the following skilled PT interventions: Patient in bed upon PT arrival. Patient alert and agreeable to PT session. Patient denied pain during session.   Therapeutic Activity: Bed Mobility: Patient performed rolling R/L and supine to/from sit with min A without cues. She then performed rolling R/L and supine to sit with CGA-supervision Provided verbal cues for bending legs and pushing through her opposite LE and reaching with the opposite arm to roll and  sliding LEs off in side-lying and pushing to sit up. Transfers: Patient performed sit to/from stand, stand pivot, and a simulated SUV height car transfer with mod-min A with HHA without cues from therapist. She then performed sit to/from stand and stand pivot using the RW with a R hand splint with min A. Provided verbal cues for hand placement on RW (patient can grip the RW, however, her hand falls off when focused  on walking without the hand splint. Also provided cues for reaching back to sit to control descent.  Gait Training:  Patient ambulated 35 feet using HHA with mod-min A. Ambulated as described below. Noted increased R trunk lean, R knee flexion, and decreased R DF with fatigue. Patient ambulated 12 feet using a RW and R hand splint with min A with improved R foot clearance. Provided verbal cues for erect posture, increased R foot clearance, R knee extension, and looking ahead. Patient went up/down 4 steps with mod A using B rails. PT facilitated placement of R hand on the rail after each step and weight shift to R when stepping with L. She performed a step to-gait pattern leading with R x1 and L x3 while ascending and leading with R x4 while descending.  Wheelchair Mobility:  Patient was unable to propel the w/c without cues from therapist due to R hemiplegia. PT provided cues and demonstration for L hemi-technique and patient propelled wheelchair 45 feet with min A-supervision. Provided verbal cues for using L foot to steer and turning technique.   Patient in recliner at end of session with breaks locked, chair alarm set, and all needs within reach.   Instructed pt in results of PT evaluation as detailed below, PT POC, rehab potential, rehab goals, and discharge recommendations. Additionally discussed CIR's policies regarding fall safety and use of chair alarm and/or quick release belt. Pt verbalized understanding and in agreement. Will update pt's family members as they become available.     PT Evaluation Precautions/Restrictions Precautions Precautions: Fall Precaution Comments: Lt crani, pathologic fx L3 General   Vital Signs Pain   Home Living/Prior Functioning Home Living Available Help at Discharge: Family(Simultaneous filing. User may not have seen previous data.) Type of Home: House(Simultaneous filing. User may not have seen previous data.) Home Access: Ramped entrance(Simultaneous filing. User may not have seen previous data.) Home Layout: One level(Simultaneous filing. User may not have seen previous data.) Bathroom Shower/Tub: Walk-in shower(Simultaneous filing. User may not have seen previous data.) Bathroom Toilet: Handicapped height(Simultaneous filing. User may not have seen previous data.) Bathroom Accessibility: Yes Additional Comments: Patient and her husband care for her father and mother at home  Lives With: Significant other;Family(spouse + sister-in-law  Simultaneous filing. User may not have seen previous data.) Prior Function Level of Independence: Independent with basic ADLs;Independent with homemaking with ambulation;Independent with gait;Independent with transfers  Able to Take Stairs?: Yes Driving: Yes Vocation: Full time employment Vocation Requirements: photographer Comments: fully independent prior, weakness started ~2-3 weeks prior to sx Vision/Perception  Vision - Assessment Tracking/Visual Pursuits: Able to track stimulus in all quads without difficulty Convergence: Within functional limits Perception Perception: Within Functional Limits Praxis Praxis: Intact  Cognition Overall Cognitive Status: Within Functional Limits for tasks assessed Arousal/Alertness: Awake/alert Attention: Focused;Sustained Focused Attention: Appears intact Sustained Attention: Appears intact Memory: Appears intact Immediate Memory Recall: Sock;Blue;Bed Memory Recall Sock: Without Cue Memory Recall Blue: Without Cue Memory Recall Bed: Without  Cue Awareness: Appears intact Problem Solving: Appears intact Safety/Judgment: Appears intact Sensation Sensation Light Touch: Appears Intact Central sensation comments: Patient reports tingling in R fingers and hand Peripheral sensation comments: Patient reports tingling at incision site on her head Light Touch Impaired Details: Impaired RUE Proprioception: Appears Intact Coordination Gross Motor Movements are Fluid and Coordinated: No Fine Motor Movements are Fluid and Coordinated: No Coordination and Movement Description: R hemi UE>LE, decreased tone in R UE Finger Nose Finger Test: Unable to test on R due to weakness  Heel Shin Test: Unable to test on R due to hip flexor weakness Motor  Motor Motor: Abnormal tone;Hemiplegia;Abnormal postural alignment and control  Mobility Bed Mobility Bed Mobility: Rolling Right;Rolling Left;Supine to Sit;Sit to Supine Rolling Right: Supervision/verbal cueing Rolling Left: Minimal Assistance - Patient > 75% Supine to Sit: Minimal Assistance - Patient > 75% Sit to Supine: Minimal Assistance - Patient > 75% Transfers Transfers: Sit to Stand;Stand Pivot Transfers;Stand to Sit Sit to Stand: Moderate Assistance - Patient 50-74% Stand to Sit: Moderate Assistance - Patient 50-74% Stand Pivot Transfers: Moderate Assistance - Patient 50 - 74% Transfer (Assistive device): 1 person hand held assist Locomotion  Gait Ambulation: Yes Gait Assistance: Moderate Assistance - Patient 50-74% Gait Distance (Feet): 35 Feet Assistive device: 1 person hand held assist Gait Assistance Details: Manual facilitation for weight shifting Gait Gait: Yes Gait Pattern: Decreased step length - left;Decreased stance time - right;Step-to pattern;Decreased stride length;Decreased hip/knee flexion - right;Decreased dorsiflexion - right;Decreased weight shift to right;Right flexed knee in stance;Right foot flat;Lateral hip instability;Decreased trunk rotation;Trunk  flexed;Narrow base of support;Poor foot clearance - right Gait velocity: decreased Stairs / Additional Locomotion Stairs: Yes Stairs Assistance: Moderate Assistance - Patient 50 - 74% Stair Management Technique: Two rails Number of Stairs: 4 Height of Stairs: 6 Wheelchair Mobility Wheelchair Mobility: No(R hemi, patient unable to propel w/c without cues/demonstration)  Trunk/Postural Assessment  Cervical Assessment Cervical Assessment: Within Functional Limits Thoracic Assessment Thoracic Assessment: Within Functional Limits Lumbar Assessment Lumbar Assessment: Within Functional Limits Postural Control Postural Control: (impaired during self care tasks with dynamic standing demands)  Balance Balance Balance Assessed: Yes Dynamic Sitting Balance Sitting balance - Comments: supervision assist for dynamic sitting during LB dressing tasks, Mod A for dynamic balance during standing portions of dressing tasks Extremity Assessment  RUE Assessment RUE Assessment: Exceptions to [facility] General Strength Comments: functional grasp/release, decreased tone in the rest of her limb LUE Assessment Active Range of Motion (AROM) Comments: WNL General Strength Comments: 3+/5 grossly RLE Assessment RLE Assessment: Exceptions to [facility] Active Range of Motion (AROM) Comments: WFL for all functional mobility General Strength Comments: Grossly in sitting: hip flexion: 2-/4, knee flexion/extension 4/5, DF/PF 4-/5 LLE Assessment LLE Assessment: Within Functional Limits Active Range of Motion (AROM) Comments: WFL for all functional mobility General Strength Comments: Grossly in sitting: 4+/5 throughout    Refer to Care Plan for Long Term Goals  Recommendations for other services: Neuropsych and Therapeutic Recreation  Stress management  Discharge Criteria: Patient will be discharged from PT if patient refuses treatment 3 consecutive times without medical reason, if treatment goals not met, if there is a  change in medical status, if patient makes no progress towards goals or if patient is discharged from hospital.  The above assessment, treatment plan, treatment alternatives and goals were discussed and mutually agreed upon: by patient  Doreene Burke PT, DPT  04/14/2019, 12:48 PM

## 2019-04-14 NOTE — Progress Notes (Signed)
Patient started on laxatives ; refuse anything stronger at this time claims she will do it tomorrow waiting to have BM tonight.

## 2019-04-15 ENCOUNTER — Inpatient Hospital Stay (HOSPITAL_COMMUNITY): Payer: BC Managed Care – PPO | Admitting: Physical Therapy

## 2019-04-15 ENCOUNTER — Inpatient Hospital Stay (HOSPITAL_COMMUNITY): Payer: BC Managed Care – PPO

## 2019-04-15 DIAGNOSIS — C7931 Secondary malignant neoplasm of brain: Secondary | ICD-10-CM | POA: Diagnosis not present

## 2019-04-15 DIAGNOSIS — C3490 Malignant neoplasm of unspecified part of unspecified bronchus or lung: Secondary | ICD-10-CM | POA: Diagnosis not present

## 2019-04-15 DIAGNOSIS — Z515 Encounter for palliative care: Secondary | ICD-10-CM

## 2019-04-15 DIAGNOSIS — R29898 Other symptoms and signs involving the musculoskeletal system: Secondary | ICD-10-CM | POA: Diagnosis not present

## 2019-04-15 DIAGNOSIS — Z7189 Other specified counseling: Secondary | ICD-10-CM

## 2019-04-15 LAB — BASIC METABOLIC PANEL
Anion gap: 10 (ref 5–15)
BUN: 10 mg/dL (ref 8–23)
CO2: 25 mmol/L (ref 22–32)
Calcium: 8.4 mg/dL — ABNORMAL LOW (ref 8.9–10.3)
Chloride: 102 mmol/L (ref 98–111)
Creatinine, Ser: 0.43 mg/dL — ABNORMAL LOW (ref 0.44–1.00)
GFR calc Af Amer: >60 mL/min (ref >60)
GFR calc non Af Amer: >60 mL/min (ref >60)
Glucose, Bld: 97 mg/dL (ref 70–99)
Potassium: 3.9 mmol/L (ref 3.5–5.1)
Sodium: 137 mmol/L (ref 135–145)

## 2019-04-15 MED ORDER — SENNOSIDES-DOCUSATE SODIUM 8.6-50 MG PO TABS
1.0000 | ORAL_TABLET | Freq: Two times a day (BID) | ORAL | Status: DC
  Administered 2019-04-15: 1 via ORAL
  Filled 2019-04-15: qty 1

## 2019-04-15 MED ORDER — SENNOSIDES-DOCUSATE SODIUM 8.6-50 MG PO TABS: 2.0000 | ORAL_TABLET | Freq: Two times a day (BID) | ORAL | Status: DC

## 2019-04-15 MED ORDER — SENNOSIDES-DOCUSATE SODIUM 8.6-50 MG PO TABS: 2.0000 | ORAL_TABLET | Freq: Every day | ORAL | Status: DC

## 2019-04-15 MED ORDER — SENNOSIDES-DOCUSATE SODIUM 8.6-50 MG PO TABS
2.0000 | ORAL_TABLET | Freq: Every day | ORAL | Status: DC
  Administered 2019-04-15: 2 via ORAL
  Filled 2019-04-15 (×2): qty 2

## 2019-04-15 MED ORDER — POLYETHYLENE GLYCOL 3350 17 G PO PACK
17.0000 g | PACK | Freq: Once | ORAL | Status: AC
  Administered 2019-04-15: 17 g via ORAL

## 2019-04-15 NOTE — Consult Note (Signed)
Consultation Note Date: 04/15/2019   Patient Name: [name]  DOB: 09/04/1955  MRN: [id number]  Age / Sex: 63 y.o., female  PCP: [name], MD Referring Physician: Meredith Staggers, MD  Reason for Consultation: Establishing goals of care and Psychosocial/spiritual support  HPI/Patient Profile: 63 y.o. female   admitted on 04/13/2019 with past medical  history of HTN, OA who started developing progressive right-sided weakness with foot drop, difficulty lifting her arm, left-sided chest pain and under the breast for 2 to 3 weeks prior to being evaluated in ED on 04/01/2019.     CT head performed, showing left frontal lobe vasogenic edema concerning for metastatic disease.    Neurology recommended MRI brain, which showed solitary left frontal lobe mass with moderate vasogenic edema and extensive small vessel disease.  She was started on Keppra and IV Decadron and transferred to [facility] for further management.   CT chest, abdomen and pelvis revealed 3 x 2.4 x 3.6 cm spiculated mass in medial RUL compatible with primary bronchogenic neoplasm, additional bilateral pulmonary metastasis LUL and BLL, thoracic nodal metastases, left supraclavicular nodal metastases, hepatic cyst and lytic osseous metastases along anterior aspect of L3 vertebral body.  Dr. Marin Olp was consulted for input and has been following along.  Radiation oncology consulted for palliative treatment of left chest wall due to ongoing severe pain right lower lobe.  Neurosurgery, Dr. Trenton Gammon consulted, patient underwent left craniotomy with resection of tumor on 04/05/2019.   Transitioned to CIR for rehabilitation before discharge home and preparing/proceeding forward with cancer treatment plan.   Patient and her husband face  the physical and emotional decisions related to treatment option decisions, advanced directive decisions, and  anticipatory care needs.   Clinical Assessment and Goals of Care:   This NP Wadie Lessen reviewed medical records, received report from team, assessed the patient and then meet at the patient's bedside along with her husband  to discuss diagnosis, prognosis, GOC,  and options.  Concept of Palliative Care was discussed and  its role in a holistic treatment plan   A detailed discussion was had today regarding advanced directives.  Concepts specific to code status, artifical feeding and hydration, continued IV antibiotics and rehospitalization was had.   Values and goals of care important to patient and family were attempted to be elicited.  Created space and opportunity for patient and her husband to explore their thoughts and feelings regarding her current medical situation. 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  Husband expressed their  need for clear, honest information in order to help he and his wife make informed decisions regarding the treatment plan .  He looks fowrard to speaking with Dr Marin Olp today     MOST form introduced and blank form left for review.  Interested in completing  documents with assistance from [place] department. Consult requested    Questions and concerns addressed.   Family encouraged to call with questions or  concerns.    PMT will continue to support holistically.    SUMMARY OF RECOMMENDATIONS    -patient is open and hopeful for viable treatment options to treat her recent cancer diagnosis, her hope is for continued quality of life - meet asap with medical oncology to discuss her options, husband has left message for Dr Marin Olp and is expecting a phone call today -speak with social work asap regarding questions concerning discharge plan ( discussed with Becky LCSW) - move forward with next steps for oncology treatment - continue physical therapy at CIR and continue when  discharged home (hopeful to employ [employer])  Code Status/Advance Care Planning:  Full code   Palliative Prophylaxis:   Bowel Regimen, Delirium Protocol and Frequent Pain Assessment  Additional Recommendations (Limitations, Scope, Preferences):  Full Scope Treatment  Psycho-social/Spiritual:   Desire for further Chaplaincy support:yes  Additional Recommendations: Created space ad opportunity for patient and her husband to explore their thoughts and feelings regarding current medical situation.  Active listening  Prognosis:   Unable to determine  Discharge Planning: Home with Home Health      Primary Diagnoses: Present on Admission: . Lung cancer metastatic to brain [facility])   I have reviewed the medical record, interviewed the patient and family, and examined the patient. The following aspects are pertinent.  Past Medical History:  Diagnosis Date  . Bronchitis   . Hypertension   . Osteoarthritis of metacarpophalangeal (MCP) joint of left thumb   . Pelvic ring fracture ([place]) 09/2016   left   Social History   Socioeconomic History  . Marital status: Married    Spouse name: Not on file  . Number of children: Not on file  . Years of education: Not on file  . Highest education level: Not on file  Occupational History  . Not on file  Tobacco Use  . Smoking status: Current Every Day Smoker    Packs/day: 0.50    Years: 25.00    Pack years: 12.50    Types: Cigarettes  . Smokeless tobacco: Never Used  Substance and Sexual Activity  . Alcohol use: No    Alcohol/week: 0.0 standard drinks  . Drug use: No  . Sexual activity: Yes    Birth control/protection: Post-menopausal  Other Topics Concern  . Not on file  Social History Narrative  . Not on file   Social Determinants of Health   Financial Resource Strain:   . Difficulty of Paying Living Expenses: Not on file  Food Insecurity:   . Worried About Charity fundraiser in the Last Year: Not on file  . Ran  Out of Food in the Last Year: Not on file  Transportation Needs:   . Lack of Transportation (Medical): Not on file  . Lack of Transportation (Non-Medical): Not on file  Physical Activity:   . Days of Exercise per Week: Not on file  . Minutes of Exercise per Session: Not on file  Stress:   . Feeling of Stress : Not on file  Social Connections:   . Frequency of Communication with Friends and Family: Not on file  . Frequency of Social Gatherings with Friends and Family: Not on file  . Attends Religious Services: Not on file  . Active Member of Clubs or Organizations: Not on file  . Attends Archivist Meetings: Not on file  . Marital Status: Not on file   Family History  Problem Relation Age of Onset  . CAD Father  CABG  . Stroke Father   . Cancer Brother        lymphoma   Scheduled Meds: . baclofen  5 mg Oral TID  . enoxaparin (LOVENOX) injection  1 mg/kg Subcutaneous BID  . feeding supplement (ENSURE ENLIVE)  237 mL Oral BID BM  . levETIRAcetam  500 mg Oral BID  . nicotine  7 mg Transdermal Daily  . pantoprazole  40 mg Oral Q supper  . senna-docusate  1 tablet Oral BID   Continuous Infusions: PRN Meds:.acetaminophen, alum & mag hydroxide-simeth, bisacodyl, diphenhydrAMINE, diphenhydrAMINE, gabapentin, guaiFENesin-dextromethorphan, HYDROcodone-acetaminophen, naLOXone (NARCAN)  injection, ondansetron **OR** ondansetron (ZOFRAN) IV, polyethylene glycol, prochlorperazine **OR** prochlorperazine **OR** prochlorperazine, promethazine, senna-docusate, sodium phosphate, traZODone Medications Prior to Admission:  Prior to Admission medications   Medication Sig Start Date End Date Taking? Authorizing Provider  acetaminophen (TYLENOL) 325 MG tablet Take 2 tablets (650 mg total) by mouth every 4 (four) hours as needed for mild pain or fever (temp > 100.5). 04/11/19   Viona [name] D, NP  acetaminophen (TYLENOL) 650 MG suppository Place 1 suppository (650 mg total) rectally  every 4 (four) hours as needed for mild pain (temp > 100.5). 04/11/19   Viona [name] D, NP  amLODipine (NORVASC) 10 MG tablet TAKE 1 TABLET (10 MG TOTAL) BY MOUTH DAILY. NEED OFFICE VISIT Patient taking differently: Take 10 mg by mouth daily.  02/06/19   Hilty, Nadean Corwin, MD  aspirin 81 MG tablet Take 81 mg by mouth daily.    [provider]  dexamethasone (DECADRON) 1 MG tablet Take 1 tablet (1 mg total) by mouth every 8 (eight) hours for 3 days. 04/13/19 04/16/19  Viona [name] D, NP  diphenhydrAMINE (BENADRYL) 25 mg capsule Take 1 capsule (25 mg total) by mouth every 8 (eight) hours as needed for itching. 04/11/19   Viona [name] D, NP  enoxaparin (LOVENOX) 60 MG/0.6ML injection Inject 0.6 mLs (60 mg total) into the skin 2 (two) times daily. 04/11/19   Viona [name] D, NP  feeding supplement, ENSURE ENLIVE, (ENSURE ENLIVE) LIQD Take 237 mLs by mouth 2 (two) times daily between meals. 04/11/19   Viona [name] D, NP  gabapentin (NEURONTIN) 300 MG capsule Take 300 mg by mouth 3 (three) times daily.     [provider]  hydrALAZINE (APRESOLINE) 25 MG tablet TAKE 1 TABLET BY MOUTH TWICE A DAY Patient taking differently: Take 25 mg by mouth 2 (two) times daily.  02/28/19   Hilty, Nadean Corwin, MD  HYDROcodone-acetaminophen (NORCO/VICODIN) 5-325 MG tablet Take 1 tablet by mouth every 4 (four) hours as needed for moderate pain. 04/11/19   Viona [name] D, NP  ibuprofen (ADVIL) 800 MG tablet Take 800 mg by mouth 3 (three) times daily as needed for moderate pain.     [provider]  ketorolac (TORADOL) 10 MG tablet Take 1 tablet (10 mg total) by mouth every 6 (six) hours. 04/12/19   Viona [name] D, NP  levETIRAcetam (KEPPRA) 500 MG tablet Take 1 tablet (500 mg total) by mouth 2 (two) times daily. 04/11/19   Viona [name] D, NP  nicotine (NICODERM CQ - DOSED IN MG/24 HR) 7 mg/24hr patch Place 1 patch (7 mg total) onto the skin daily. 04/11/19   Viona [name] D, NP    ondansetron (ZOFRAN) 4 MG tablet Take 1 tablet (4 mg total) by mouth every 4 (four) hours as needed for nausea or vomiting. 04/11/19   Viona [name] D, NP  ondansetron (ZOFRAN) 4 MG/2ML SOLN injection Inject  2 mLs (4 mg total) into the vein every 4 (four) hours as needed for nausea or vomiting. 04/11/19   Viona [name] D, NP  promethazine (PHENERGAN) 12.5 MG tablet Take 1-2 tablets (12.5-25 mg total) by mouth every 4 (four) hours as needed for refractory nausea / vomiting. 04/11/19   Viona [name] D, NP   Allergies  Allergen Reactions  . Codeine   . Lisinopril-Hydrochlorothiazide Other (See Comments)    Back pain   Review of Systems  Constitutional: Positive for fatigue.    Physical Exam Cardiovascular:     Rate and Rhythm: Normal rate.  Musculoskeletal:     Comments: - Right sided weakness  Skin:    General: Skin is warm and dry.  Neurological:     Mental Status: She is alert and oriented to person, place, and time.     Vital Signs: BP 119/73 (BP Location: Left Arm)   Pulse (!) 53   Temp 97.8 F (36.6 C) (Oral)   Resp 18   Ht 5\' 7"  (1.702 m)   Wt 59.3 kg   SpO2 98%   BMI 20.48 kg/m  Pain Scale: 0-10   Pain Score: 3    SpO2: SpO2: 98 % O2 Device:SpO2: 98 % O2 Flow Rate: .   IO: Intake/output summary:   Intake/Output Summary (Last 24 hours) at 04/15/2019 1033 Last data filed at 04/15/2019 0810 Gross per 24 hour  Intake 580 ml  Output --  Net 580 ml    LBM: Last BM Date: 04/10/19 Baseline Weight: Weight: 59.3 kg Most recent weight: Weight: 59.3 kg     Palliative Assessment/Data:  50 %    Discussed with LCSW  Time In: 1420 Time Out: 1530 Time Total: 70 minutes Greater than 50%  of this time was spent counseling and coordinating care related to the above assessment and plan.  Signed by: Wadie Lessen, NP   Please contact Palliative Medicine Team phone at [phone] for questions and concerns.  For individual provider: See  Shea Evans

## 2019-04-15 NOTE — Anesthesia Postprocedure Evaluation (Signed)
Anesthesia Post Note  Patient: [name]  Procedure(s) Performed: LEFT FRONTAL CRANIOTOMY TUMOR EXCISION w/Brain Lab (Left ) APPLICATION OF CRANIAL NAVIGATION (Left )     Anesthesia Post Evaluation  Last Vitals:  Vitals:   04/13/19 0533 04/13/19 0805  BP: 140/77 122/74  Pulse: 60 (!) 53  Resp: 16 16  Temp: 36.4 C (!) 36.4 C  SpO2: 97% 98%    Last Pain:  Vitals:   04/13/19 0805  TempSrc: Oral  PainSc:                  Effie Berkshire

## 2019-04-15 NOTE — Progress Notes (Signed)
Physical Therapy Session Note  Patient Details  Name: [name] MRN: [id number] Date of Birth: 11-Jan-1956  Today's Date: 04/15/2019 PT Individual Time: 2620-3559 PT Individual Time Calculation (min): 71 min   Short Term Goals: Week 1:  PT Short Term Goal 1 (Week 1): STG=LTG due to short ELOS.  Skilled Therapeutic Interventions/Progress Updates:  Pt received in recliner & agreeable to tx. Provided pt with w/c cushion to prevent skin breakdown & promote OOB tolerance. Pt ambulates ~20 ft in room without AD & min/mod assist with pt frequently holding to furniture with LUE. Transported pt to gym via w/c dependent assist for time management. Therapist provides instruction for management of R hand on hand orthosis & pt able to complete task. Pt ambulates ~75 ft + 75 ft with RW & R hand orthosis with min assist with good foot clearance & advancement RLE with pt maintaining slight R knee flexion throughout. Pt reports fatigue after each gait trial & requires seated rest break. Pt utilized dynavision while standing without support with CGA assist with task focusing on standing tolerance & balance with pt reaching across midline and in all directions to press lights with LUE; pt is able to stand 2 minutes + 2 minutes with seated rest break in between 2/2 fatigue. Pt transfers w/c<>mat table via stand pivot without AD & min assist. From elevated EOM with LLE elevated on 3" step pt performs sit<>stand transfers x 5 reps with mod assist with task focusing on increased weight bearing through RLE for strengthening, balancing in standing, and eccentric lowering for stand>sit. Attempted 2nd set but pt too fatigued. Pt propelled w/c ~50 ft with BLE with supervision and instructional cuing for increased RLE use before pt fatigues. Pt assisted back to room and ambulates ~10 ft with RW & min assist. Pt transfers sit>supine with use of UE to assist RLE onto bed. Pt left in bed with alarm set & husband present in room,  call bell in reach.   Pt reports she will have someone with her at all times that can provide 24 hr supervision/assist and discussed hands on level of care PT goals that are currently established. Also educated pt on weekly team meetings & anticipated ELOS as per PT evaluation.   Therapy Documentation Precautions:  Precautions Precautions: Fall Precaution Comments: Lt crani, pathologic fx L3 Restrictions Weight Bearing Restrictions: No   Pain: Pt reports unrated pain in R groin/upper thigh region but declines requesting pain medication. Therapist provides support to RUE as pt with c/o unrated pain in RUE in dependent position. Pt c/o R calf pain 2/2 DVT at end of session & RN made aware.   Therapy/Group: Individual Therapy  Waunita Schooner 04/15/2019, 11:22 AM

## 2019-04-15 NOTE — Progress Notes (Signed)
Physical Therapy Session Note  Patient Details  Name: [name] MRN: [id number] Date of Birth: 31-Jul-1955  Today's Date: 04/15/2019 PT Individual Time: 1515-1600 PT Individual Time Calculation (min): 45 min   Short Term Goals: Week 1:  PT Short Term Goal 1 (Week 1): STG=LTG due to short ELOS.  Skilled Therapeutic Interventions/Progress Updates:   Pt resting in bed.  She was restless and frustrated at pain of posterior ribs limiting her positions in bed. Pain un-rated; she declined meds.  neuromuscular re-education via multimodal cues and demo (in partial r side lying) for bil hip adductor squeezes, bil lower trunk rotation, R heel slides, R ankle pumps with focus on eversion.  PROM L ankle.  Pt and husband [name] educated on use of a towel roll behind back, below ribs, pillow between LLs, and pillow to hug, when lying on R side, for improved comfort over using multiple pillows that slide around on bed.  Attempted to position pt in nearly supine but she was unable to tolerate. Also educated pt on diaphragmatic breathing with relaxation imagery, for pain and anxiety mgt, with good carry-over during session.  At end of session, pt comfortable in R side lying.  Instructions posted by PT on bulliton board over her bed. Bed alarm set and needs at hand.     Therapy Documentation Precautions:  Precautions Precautions: Fall Precaution Comments: Lt crani, pathologic fx L3 Restrictions Weight Bearing Restrictions: No       Therapy/Group: Individual Therapy  Jacobs Golab 04/15/2019, 4:12 PM

## 2019-04-15 NOTE — Progress Notes (Signed)
Occupational Therapy Session Note  Patient Details  Name: [name] MRN: [id number] Date of Birth: 07-29-1955  Today's Date: 04/15/2019 OT Individual Time: 6168-3729 OT Individual Time Calculation (min): 55 min    Short Term Goals: Week 1:  OT Short Term Goal 1 (Week 1): STGs=LTGs due to ELOS  Skilled Therapeutic Interventions/Progress Updates:    Pt received supine with no c/o pain, agreeable to OT. Pt completed bed mobility with CGA. She changed shirt EOB with min A and mod cueing for hemi technique. Pt donned pants with min A sit <> stand from EOB. Pt very receptive to education and very motivated for R NMR. Pt completed functional mobility to the sink with use of RW with min A. Pt stood for oral care. Cueing/education provided for using RUE as a gross stabilizer while distal coordination returns. Pt was taken to the therapy gym. Pt completed 75 ft of functional mobility with min A with RW. Occasional slight LOB with R turn. Pt stood EOM and completed functional reaching with her RUE, requiring min-mod facilitation for R shoulder flexion. Pt returned to room and was left in recliner with all needs met. Chair alarm set.   Therapy Documentation Precautions:  Precautions Precautions: Fall Precaution Comments: Lt crani, pathologic fx L3 Restrictions Weight Bearing Restrictions: No  Therapy/Group: Individual Therapy  Curtis Sites 04/15/2019, 6:52 AM

## 2019-04-15 NOTE — Progress Notes (Signed)
[place] PHYSICAL MEDICINE & REHABILITATION PROGRESS NOTE  Subjective/Complaints: Patient seen sitting up in bed this AM.  She states she slept well overnight. Her pain is well controlled. Last BM was 3 days ago.   ROS: Denies CP, SOB, N/V/D  Objective: Vital Signs: Blood pressure 119/73, pulse (!) 53, temperature 97.8 F (36.6 C), temperature source Oral, resp. rate 18, height 5\' 7"  (9.675 m), weight 59.3 kg, SpO2 98 %. No results found. Recent Labs    04/14/19 1013  WBC 15.6*  HGB 13.1  HCT 38.6  PLT 414*   Recent Labs    04/14/19 1013 04/15/19 0617  NA 138 137  K 4.6 3.9  CL 106 102  CO2 22 25  GLUCOSE 114* 97  BUN 15 10  CREATININE 0.53 0.43*  CALCIUM 8.3* 8.4*    Physical Exam: BP 119/73 (BP Location: Left Arm)   Pulse (!) 53   Temp 97.8 F (36.6 C) (Oral)   Resp 18   Ht 5\' 7"  (1.702 m)   Wt 59.3 kg   SpO2 98%   BMI 20.48 kg/m  Constitutional: No distress . Vital signs reviewed. Frail.  HENT: Normocephalic.  Atraumatic. Eyes: EOMI. No discharge. Cardiovascular: No JVD. Respiratory: Normal effort.  No stridor. GI: Non-distended. Skin: Warm and dry.  Intact. Psych: Normal mood.  Normal behavior. Musc: Right lower extremity with tenderness. Neurological:  Alert Motor: Left upper extremity: 5/5 proximal distal Right upper extremity: Proximally 0/5, handgrip 4-/5 with apraxia Left lower extremity: Hip flexion, knee extension 4 -/5, ankle dorsiflexion 4/5 Right lower extremity: Hip flexion, knee extension 2/5, ankle dorsiflexion 4/5  Assessment/Plan: 1. Functional deficits secondary to brain and spine metastatic disease which require 3+ hours per day of interdisciplinary therapy in a comprehensive inpatient rehab setting.  Physiatrist is providing close team supervision and 24 hour management of active medical problems listed below.  Physiatrist and rehab team continue to assess barriers to discharge/monitor patient progress toward functional and  medical goals  Care Tool:  Bathing    Body parts bathed by patient: Chest, Abdomen, Front perineal area, Right upper leg, Buttocks, Left upper leg, Right lower leg, Left lower leg, Face   Body parts bathed by helper: Right arm, Left arm     Bathing assist Assist Level: Minimal Assistance - Patient > 75%     Upper Body Dressing/Undressing Upper body dressing   What is the patient wearing?: Pull over shirt    Upper body assist Assist Level: Moderate Assistance - Patient 50 - 74%    Lower Body Dressing/Undressing Lower body dressing      What is the patient wearing?: Pants     Lower body assist Assist for lower body dressing: Minimal Assistance - Patient > 75%     Toileting Toileting Toileting Activity did not occur (Clothing management and hygiene only): N/A (no void or bm)  Toileting assist Assist for toileting: Moderate Assistance - Patient 50 - 74%     Transfers Chair/bed transfer  Transfers assist     Chair/bed transfer assist level: Moderate Assistance - Patient 50 - 74%     Locomotion Ambulation   Ambulation assist      Assist level: Moderate Assistance - Patient 50 - 74% Assistive device: Hand held assist Max distance: 35'   Walk 10 feet activity   Assist     Assist level: Moderate Assistance - Patient - 50 - 74% Assistive device: Hand held assist   Walk 50 feet activity   Assist Walk  50 feet with 2 turns activity did not occur: Safety/medical concerns(Decreased strength/activity tolerance)         Walk 150 feet activity   Assist Walk 150 feet activity did not occur: Safety/medical concerns(Decreased strength/activity tolerance)         Walk 10 feet on uneven surface  activity   Assist Walk 10 feet on uneven surfaces activity did not occur: Safety/medical concerns(Decreased strength/activity tolerance)         Wheelchair     Assist   Type of Wheelchair: Manual Wheelchair activity did not occur: Safety/medical  concerns(R hemi, patient unable to propel without skilled intervention)         Wheelchair 50 feet with 2 turns activity    Assist    Wheelchair 50 feet with 2 turns activity did not occur: Safety/medical concerns(R hemi, patient unable to propel without skilled intervention)       Wheelchair 150 feet activity     Assist Wheelchair 150 feet activity did not occur: Safety/medical concerns(R hemi, patient unable to propel without skilled intervention)          Medical Problem List and Plan: 1.  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   Continue CIR therapies  PRAFO to right lower extremity to prevent plantarflexion contracture 2.  Antithrombotics: -RLE DVT/anticoagulation:  Pharmaceutical: Therapeutic Lovenox,?  Transition to NOAC after discharge             -antiplatelet therapy: N/A 3. Pain Management: Oxycodone prn             Monitor with increased mobility 4. Mood: LCSW to follow for evaluation              -antipsychotic agents: N/A 5. Neuropsych: This patient is capable of making decisions on her own behalf. 6. Skin/Wound Care: Routine pressure relief measures 7. Fluids/Electrolytes/Nutrition: Ensure Enlive po BID for increased nutritional needs due to cancer/hypoalbuminemia, appreciate dietary input.  BMP within acceptable range on 12/20, 12/21 8. Stage 4 adenocarcinoma of the lung with metastases to the brain and spine:             -Post-op brain radiation to start on Jan 4th.             -Specimen sent for genetic analysis, results should take 7-10 days             Palliative care consulted 9. Spasticity:   Baclofen 5mg  TID for increased tone in right lower extremity, monitor for sedation.  10.  Leukocytosis-likely related to CA  WBCs 15.6 on 12/20, stable 12/21  Afebrile  Continue to monitor 11. Constipation: Will add Senna-Docusate   LOS: 2 days A FACE TO FACE EVALUATION WAS  PERFORMED  Clide Deutscher Safari Cinque 04/15/2019, 9:54 AM

## 2019-04-15 NOTE — Progress Notes (Signed)
Patient information reviewed and entered into [facility] System by Becky Deijah Spikes, PPS coordinator. Information including medical coding, function ability, and quality indicators will be reviewed and updated through discharge.   

## 2019-04-16 ENCOUNTER — Inpatient Hospital Stay (HOSPITAL_COMMUNITY): Payer: BC Managed Care – PPO | Admitting: Occupational Therapy

## 2019-04-16 ENCOUNTER — Inpatient Hospital Stay (HOSPITAL_COMMUNITY): Payer: BC Managed Care – PPO | Admitting: Physical Therapy

## 2019-04-16 DIAGNOSIS — Z515 Encounter for palliative care: Secondary | ICD-10-CM

## 2019-04-16 DIAGNOSIS — Z7189 Other specified counseling: Secondary | ICD-10-CM

## 2019-04-16 MED ORDER — HYDROCODONE-ACETAMINOPHEN 10-325 MG PO TABS
1.0000 | ORAL_TABLET | ORAL | Status: DC | PRN
  Administered 2019-04-16 – 2019-04-17 (×6): 1 via ORAL
  Filled 2019-04-16 (×6): qty 1

## 2019-04-16 MED ORDER — HYDROCODONE-ACETAMINOPHEN 5-325 MG PO TABS
1.0000 | ORAL_TABLET | Freq: Once | ORAL | Status: AC
  Administered 2019-04-16: 1 via ORAL
  Filled 2019-04-16: qty 1

## 2019-04-16 NOTE — Plan of Care (Signed)
Ambulation goals upgraded 2/2 progress, wheelchair mobility goals discharged 2/2 anticipating pt being ambulatory at d/c   Problem: RH Balance Goal: LTG Patient will maintain dynamic standing balance (PT) Description: LTG:  Patient will maintain dynamic standing balance with assistance during mobility activities (PT) Flowsheets (Taken 04/16/2019 1126) LTG: Pt will maintain dynamic standing balance during mobility activities with:: (upgrade 2/2 progress) Contact Guard/Touching assist Note: upgrade 2/2 progress   Problem: Sit to Stand Goal: LTG:  Patient will perform sit to stand with assistance level (PT) Description: LTG:  Patient will perform sit to stand with assistance level (PT) Flowsheets (Taken 04/16/2019 1126) LTG: PT will perform sit to stand in preparation for functional mobility with assistance level: (upgrade 2/2 progress) Supervision/Verbal cueing Note: upgrade 2/2 progress   Problem: RH Bed to Chair Transfers Goal: LTG Patient will perform bed/chair transfers w/assist (PT) Description: LTG: Patient will perform bed to chair transfers with assistance (PT). Flowsheets (Taken 04/16/2019 1126) LTG: Pt will perform Bed to Chair Transfers with assistance level: (upgrade 2/2 progress) Contact Guard/Touching assist Note: upgrade 2/2 progress   Problem: RH Furniture Transfers Goal: LTG Patient will perform furniture transfers w/assist (OT/PT) Description: LTG: Patient will perform furniture transfers  with assistance (OT/PT). Flowsheets (Taken 04/16/2019 1126) LTG: Pt will perform furniture transfers with assist:: (upgrade 2/2 progress) Contact Guard/Touching assist Note: upgrade 2/2 progress   Problem: RH Ambulation Goal: LTG Patient will ambulate in controlled environment (PT) Description: LTG: Patient will ambulate in a controlled environment, # of feet with assistance (PT). Flowsheets (Taken 04/16/2019 1126) LTG: Pt will ambulate in controlled environ  assist needed::  (upgrade 2/2 progress) Supervision/Verbal cueing LTG: Ambulation distance in controlled environment: 100 ft with LRAD Note: upgrade 2/2 progress Goal: LTG Patient will ambulate in home environment (PT) Description: LTG: Patient will ambulate in home environment, # of feet with assistance (PT). Flowsheets (Taken 04/16/2019 1126) LTG: Pt will ambulate in home environ  assist needed:: (upgrade 2/2 progress) Supervision/Verbal cueing LTG: Ambulation distance in home environment: 50 ft with LRAD Note: upgrade 2/2 progress Goal: LTG Patient will ambulate in community environment (PT) Description: LTG: Patient will ambulate in community environment, # of feet with assistance (PT). Outcome: Not Applicable Flowsheets (Taken 04/16/2019 1126) LTG: Pt will ambulate in community environ  assist needed:: (d/c goal - not appropriate at this time) -- Note: d/c goal - not appropriate at this time   Problem: RH Wheelchair Mobility Goal: LTG Patient will propel w/c in controlled environment (PT) Description: LTG: Patient will propel wheelchair in controlled environment, # of feet with assist (PT) Outcome: Not Applicable Flowsheets (Taken 04/16/2019 1126) LTG: Pt will propel w/c in controlled environ  assist needed:: (d/c goal - anticipate pt to be ambulatory at d/c) -- Note: d/c goal - anticipate pt to be ambulatory at d/c Goal: LTG Patient will propel w/c in home environment (PT) Description: LTG: Patient will propel wheelchair in home environment, # of feet with assistance (PT). Outcome: Not Applicable Note: d/c goal - anticipate pt to be ambulatory at d/c Goal: LTG Patient will propel w/c in community environment (PT) Description: LTG: Patient will propel wheelchair in community environment, # of feet with assist (PT) Outcome: Not Applicable Note: d/c goal - anticipate pt to be ambulatory at d/c

## 2019-04-16 NOTE — Progress Notes (Addendum)
Physical Therapy Session Note  Patient Details  Name: [name] MRN: [id number] Date of Birth: October 21, 1955  Today's Date: 04/16/2019 PT Individual Time: 7703-4035 PT Individual Time Calculation (min): 38 min   Short Term Goals: Week 1:  PT Short Term Goal 1 (Week 1): STG=LTG due to short ELOS.  Skilled Therapeutic Interventions/Progress Updates:  Pt received in w/c & agreeable to tx. Educated pt on discharge date as set in conference. Pt reports feeling a little weak and dizzy, BP = 114/82 mmHg, HR = 63 bpm. Transported pt to gym via w/c dependent assist. Pt transfers sit>stand with CGA and manages RUE on hand orthosis with ongoing cuing for technique. Pt ambulates ~30 ft with RW & min assist but requires max assist x 1 occasion 2/2 LOB and pt reporting increased dizziness. Pt sits on nu-step and is provided with rest break. Pt utilizes nu-step on level 1 x 10 minutes with all four extremities with RUE hand splint with task focusing on R knee neutral alignment and R UE/LE strengthening & NMR. Pt reports not feeling dizzy while using nu-step. Pt then ambulates back to w/c with RW & min assist. Provided pt with options to work on standing tolerance but pt reports her back is beginning to hurt more & requests to rest. Pt returned to room & ambulates ~5 ft w/c>recliner with RW & min assist. Pt left in recliner with chair alarm donned & call bell in reach, pt denied feeling dizzy prior to PT exiting.   Therapy Documentation Precautions:  Precautions Precautions: Fall Precaution Comments: Lt crani, pathologic fx L3 Restrictions Weight Bearing Restrictions: No  General: PT Amount of Missed Time (min): 22 Minutes PT Missed Treatment Reason: Pain;Patient fatigue  Pain: Pt reports 5/10 in L lower back but states she's premedicated. Rest breaks provided PRN.   Therapy/Group: Individual Therapy  Waunita Schooner 04/16/2019, 11:49 AM

## 2019-04-16 NOTE — Progress Notes (Signed)
Occupational Therapy Session Note  Patient Details  Name: [name] MRN: [id number] Date of Birth: 08/18/1955  Today's Date: 04/16/2019  Session 1 OT Individual Time: 0955-1030 OT Individual Time Calculation (min): 35 min  Pt missed 40 minutes of OT 2/2 back pain  Session 2 OT Individual Time: 1400-1500 OT Individual Time Calculation (min): 60 min   Short Term Goals: Week 1:  OT Short Term Goal 1 (Week 1): STGs=LTGs due to ELOS  Skilled Therapeutic Interventions/Progress Updates:    Session 1   Pt greeted semi-reclined in bed, reporting 10/10 pain in back. Pt reported she did not think she could participate with back pain so high. MD entered room with plans to increase pain medicine. OT returned later in the morning with pt reporting much improved pain and agreeable to participate in OT. Pt completed bed mobility with mod A to elevate trunk on R side. Sit<>stand from EOB with RW and min A with min A for R hand placement on RW hand splint. Min A to ambulate into bathroom and turn onto commode. Pt voided bladder and completed peri-care with supervision. Pt needed mod A to stand from lower commode, then ambulated out of bathroom in similar fashion. OT educated on UB and LB hemi dressing strategies with pt needing min A overall to complete task. OT educated on self-ROM strategies with focus on shoulder/elbow/wrist flex/ext. Pt left seated in wc with chair alarm on, call bell in reach, and needs met.   Session 2 Pt greeted seated EOB with spouse present, working on LB there-ex in sitting. Pt reported need to go to the bathroom. Pt completed sit<>stand from EOB witH RW and R hand splint and min A. Pt ambulated into bathroom w/ RW and min A. Pt voided bladder and hadd successful BM. Pt able to complete peri-care with set-up A. Pt brought down to therapy gym in wc. R UE NMR standing at raised mat and pushing  Up on wedge with towel. Pt needed hand over hand A to achieve shoulder flex/ext, slight  avtivation noted. Pt with more back pain in standing. Brought pt into sitting and completed more repetitions. Provided pt with soft yellow theraputty and worked on grasp/release, as well as weight bearing to press down on putty. OT facilitation at elbow and shoulder to roll putty into a log. Pt was then able to pinch with first finger and thumb, but needed OT assist to move down the log to continue pinching. OT provided pt with 1/2 lap tray for wc to support R UE. Pt returned to room and completed stand-pivot back to bed with min A. Pt left in sidelying with pillows for support, call bell in reach, bed alarm on, and husband present.   Therapy Documentation Precautions:  Precautions Precautions: Fall Precaution Comments: Lt crani, pathologic fx L3 Restrictions Weight Bearing Restrictions: No General: General OT Amount of Missed Time: 40 Minutes OT Missed Treatment Reason: Pain;Patient fatigue Pain: Pain Assessment Pain Scale: 0-10 Pain Score: 10  Pain Type: Acute pain Pain Location: Back Pain Orientation: Mid;Posterior Pain Descriptors / Indicators: Discomfort;Aching Pain Frequency: Intermittent Pain Onset: On-going Patients Stated Pain Goal: 3 Pain Intervention(s): Repositioned, nursing notified    Therapy/Group: Individual Therapy   S  04/16/2019, 3:05 PM  

## 2019-04-16 NOTE — IPOC Note (Signed)
Overall Plan of Care [facility]) Patient Details Name: Amiri Tritch MRN: [id number] DOB: 01-Apr-1956  Admitting Diagnosis: Lung cancer metastatic to brain [facility])  Hospital Problems: Principal Problem:   Lung cancer metastatic to brain [facility]) Active Problems:   Leucocytosis   Hypoalbuminemia due to protein-calorie malnutrition ([place])     Functional Problem List: Nursing Endurance, Medication Management, Motor, Nutrition, Pain, Perception, Safety, Sensory, Skin Integrity  PT Balance, Edema, Endurance, Motor, Nutrition, Pain, Perception, Safety, Sensory, Skin Integrity  OT Balance, Endurance, Motor, Pain  SLP    TR         Basic ADL's: OT Grooming, Bathing, Dressing, Toileting     Advanced  ADL's: OT Simple Meal Preparation     Transfers: PT Bed Mobility, Bed to Chair, Car, Furniture, Floor  OT Toilet, Tub/Shower     Locomotion: PT Ambulation, Emergency planning/management officer, Stairs     Additional Impairments: OT Fuctional Use of Upper Extremity  SLP        TR      Anticipated Outcomes Item Anticipated Outcome  Self Feeding No goal  Swallowing      Basic self-care  Supervision-Min A  Toileting  Min A   Bathroom Transfers Min A  Bowel/Bladder  mod I  Transfers  min A using LRAD  Locomotion  CGA using LRAD 100 ft  Communication     Cognition     Pain  pain less than 2  Safety/Judgment  mod I   Therapy Plan: PT Intensity: Minimum of 1-2 x/day ,45 to 90 minutes PT Frequency: 5 out of 7 days PT Duration Estimated Length of Stay: 7-10 days OT Intensity: Minimum of 1-2 x/day, 45 to 90 minutes OT Frequency: 5 out of 7 days OT Duration/Estimated Length of Stay: 7-10 days     Due to the current state of emergency, patients may not be receiving their 3-hours of Medicare-mandated therapy.   Team Interventions: Nursing Interventions Patient/Family Education, Disease Management/Prevention, Pain Management, Medication Management, Skin Care/Wound Management, Discharge  Planning, Psychosocial Support, Other (comment)  PT interventions Ambulation/gait training, Discharge planning, Functional mobility training, Psychosocial support, Therapeutic Activities, Visual/perceptual remediation/compensation, Balance/vestibular training, Disease management/prevention, Neuromuscular re-education, Skin care/wound management, Therapeutic Exercise, Wheelchair propulsion/positioning, DME/adaptive equipment instruction, Pain management, Splinting/orthotics, UE/LE Strength taining/ROM, Community reintegration, Technical sales engineer stimulation, Patient/family education, IT trainer, UE/LE Coordination activities  OT Interventions Training and development officer, Discharge planning, Pain management, Self Care/advanced ADL retraining, Therapeutic Activities, UE/LE Coordination activities, Therapeutic Exercise, Patient/family education, Functional mobility training, Disease mangement/prevention, Academic librarian, Engineer, drilling, Neuromuscular re-education, Psychosocial support, Splinting/orthotics, UE/LE Strength taining/ROM, Wheelchair propulsion/positioning  SLP Interventions    TR Interventions    SW/CM Interventions Discharge Planning, Psychosocial Support, Patient/Family Education   Barriers to Discharge MD  Medical stability  Nursing Other (comments)    PT Pending chemo/radiation Patient's husband is caring for the patient's mother and father at home, plan to place father in a long term care facility soon  OT Medical stability, Pending chemo/radiation    SLP      SW       Team Discharge Planning: Destination: PT-Home ,OT- Home , SLP-  Projected Follow-up: PT-Home health PT, OT-  Home health OT, SLP-  Projected Equipment Needs: PT-To be determined, OT- To be determined, SLP-  Equipment Details: PT-Patient has RW, will assess w/c and other equipment needs throughout stay as patient progresses, OT-  Patient/family involved in discharge planning: PT-  Patient,  OT-Patient, SLP-   MD ELOS: 10-14 days Medical Rehab Prognosis:  Excellent Assessment: Mrs. Allyson Sabal  needs better pain control in order to maximize her participation in therapy. I have increased her Norco dose for severe pain this morning.     See Team Conference Notes for weekly updates to the plan of care

## 2019-04-16 NOTE — Patient Care Conference (Signed)
Inpatient RehabilitationTeam Conference and Plan of Care Update Date: 04/16/2019   Time: 10:35 AM    Patient Name: [name]      Medical Record Number: [id number]  Date of Birth: Jul 25, 1955 Sex: Female         Room/Bed: 4W10C/4W10C-01 Payor Info: Payor: [place] / Plan: [employer]BS COMM PPO / Product Type: *No Product type* /    Admit Date/Time:  04/13/2019  3:50 PM  Primary Diagnosis:  Lung cancer metastatic to brain [facility])  Patient Active Problem List   Diagnosis Date Noted  . Palliative care by specialist   . DNR (do not resuscitate) discussion   . Leucocytosis   . Hypoalbuminemia due to protein-calorie malnutrition ([place])   . Lung cancer metastatic to brain ([place]) 04/13/2019  . Adenocarcinoma, lung ([place])   . Postoperative pain   . Spasticity   . Acute deep vein thrombosis (DVT) of calf muscle vein of right lower extremity ([place])   . Adenocarcinoma of lung, stage 4, right ([place]) 04/11/2019  . Metastatic cancer to brain ([place]) 04/05/2019  . Cancer, metastatic to bone ([place]) 04/03/2019  . Brain mass 04/01/2019  . Intermittent explosive disorder 06/13/2016  . Chest pain 01/11/2013  . HYPERCHOLESTEROLEMIA WITH HIGH HDL 04/08/2009  . Essential hypertension 04/08/2009  . TOBACCO ABUSE 11/20/2008    Expected Discharge Date: Expected Discharge Date: 04/23/19  Team Members Present: Physician leading conference: Dr. Leeroy Cha Social Worker Present: Lennart Pall, LCSW Nurse Present: Mohammed Kindle, RN Case Manager: Karene Fry, RN PT Present: Lavone Nian, PT OT Present: Cherylynn Ridges, OT SLP Present: Jettie Booze, CF-SLP PPS Coordinator present : Gunnar Fusi, SLP     Current Status/Progress Goal Weekly Team Focus  Bowel/Bladder   Continent of bladder/bowel  Msintain continence  QS/PRN assessmemt, address toileting needs   Swallow/Nutrition/ Hydration             ADL's   Min/mod A BADL tasks and transfers  Supervision/CGA  R NMR, self-care retraining, activity  tolerance, pain management   Mobility   min assist for transfers & gait with RW & R hand orthosis, mod assist for stair negotiation  min assist transfers, CGA gait with LRAD, min assist transfers  R NMR, strengthening, gait, transfers, stair negotiation, balance, endurance   Communication             Safety/Cognition/ Behavioral Observations            Pain   s/p left frontal craniotomy brain excision,c/o back and right leg pain  < 3  QS/PRN assessment and evaluate treatment, evaluate effectivnes   Skin   Incision head s/p surgery  No evidence of infection  QS/PRN assessment,    Rehab Goals Patient on target to meet rehab goals: Yes *See Care Plan and progress notes for long and short-term goals.     Barriers to Discharge  Current Status/Progress Possible Resolutions Date Resolved   Nursing  Other (comments)               PT  Pending chemo/radiation  Patient's husband is caring for the patient's mother and father at home, plan to place father in a long term care facility soon              OT Medical stability;Pending chemo/radiation                SLP                SW  Discharge Planning/Teaching Needs:  Plan to d/c home with spouse and other family able to provide 24/7 assistance.  Teaching needs still TBD   Team Discussion: Pain, added norco 10 mg, did not sleep well.  RN cont B/B, BM yesterday, pain main issue, got norco.  OT amb to bathroom, min/mod A ADLs, goals min A, may update goals to CGA/S.  PR min a overall, amb 75' RW, min A transfers, may upgrade to CGA/S goals, L calf pain yesterday.  No SLP.   Revisions to Treatment Plan: N/A     Medical Summary Current Status: Medically stable except for pain control; improved pain with increased Hydrocodone dosage Weekly Focus/Goal: Improve pain control; will add Gabapentin prn for patient to take when she wakes at night, both for pain control as well as to help her return to sleep     Possible  Resolutions to Barriers: Titration of pain medications   Continued Need for Acute Rehabilitation Level of Care: The patient requires daily medical management by a physician with specialized training in physical medicine and rehabilitation for the following reasons: Direction of a multidisciplinary physical rehabilitation program to maximize functional independence : Yes Medical management of patient stability for increased activity during participation in an intensive rehabilitation regime.: Yes Analysis of laboratory values and/or radiology reports with any subsequent need for medication adjustment and/or medical intervention. : Yes   I attest that I was present, lead the team conference, and concur with the assessment and plan of the team.   Retta Diones 04/17/2019, 11:58 AM  Team conference was held via web/ teleconference due to [place] - 19

## 2019-04-16 NOTE — Plan of Care (Signed)
  Problem: RH Balance Goal: LTG Patient will maintain dynamic standing with ADLs (OT) Description: LTG:  Patient will maintain dynamic standing balance with assist during activities of daily living (OT)  Flowsheets (Taken 04/16/2019 1234) LTG: Pt will maintain dynamic standing balance during ADLs with: Supervision/Verbal cueing Note: Goal upgraded 12/22-ESD   Problem: Sit to Stand Goal: LTG:  Patient will perform sit to stand in prep for activites of daily living with assistance level (OT) Description: LTG:  Patient will perform sit to stand in prep for activites of daily living with assistance level (OT) Flowsheets (Taken 04/16/2019 1234) LTG: PT will perform sit to stand in prep for activites of daily living with assistance level: Supervision/Verbal cueing Note: Goal upgraded 12/22-ESD   Problem: RH Bathing Goal: LTG Patient will bathe all body parts with assist levels (OT) Description: LTG: Patient will bathe all body parts with assist levels (OT) Flowsheets (Taken 04/16/2019 1234) LTG: Pt will perform bathing with assistance level/cueing: Contact Guard/Touching assist Note: Goal upgraded 12/22-ESD   Problem: RH Dressing Goal: LTG Patient will perform lower body dressing w/assist (OT) Description: LTG: Patient will perform lower body dressing with assist, with/without cues in positioning using equipment (OT) Flowsheets (Taken 04/16/2019 1234) LTG: Pt will perform lower body dressing with assistance level of: Supervision/Verbal cueing Note: Goal upgraded 12/22-ESD   Problem: RH Toileting Goal: LTG Patient will perform toileting task (3/3 steps) with assistance level (OT) Description: LTG: Patient will perform toileting task (3/3 steps) with assistance level (OT)  Flowsheets (Taken 04/16/2019 1234) LTG: Pt will perform toileting task (3/3 steps) with assistance level: Supervision/Verbal cueing Note: Goal upgraded 12/22-ESD   Problem: RH Toilet Transfers Goal: LTG Patient will  perform toilet transfers w/assist (OT) Description: LTG: Patient will perform toilet transfers with assist, with/without cues using equipment (OT) Flowsheets (Taken 04/16/2019 1234) LTG: Pt will perform toilet transfers with assistance level of: Supervision/Verbal cueing Note: Goal upgraded 12/22-ESD   Problem: RH Tub/Shower Transfers Goal: LTG Patient will perform tub/shower transfers w/assist (OT) Description: LTG: Patient will perform tub/shower transfers with assist, with/without cues using equipment (OT) Flowsheets (Taken 04/16/2019 1234) LTG: Pt will perform tub/shower stall transfers with assistance level of: Supervision/Verbal cueing Note: Goal upgraded 12/22-ESD

## 2019-04-16 NOTE — Progress Notes (Addendum)
[place] PHYSICAL MEDICINE & REHABILITATION PROGRESS NOTE  Subjective/Complaints: [name] is in much more pain this morning. Pain is present in her lower thoracic/upper lumbar spine and has a throbbing quality. The pain in her legs is much improved. She appears uncomfortable. She just received Norco 5mg  30 minutes ago.   ROS: Denies CP, SOB, N/V/D  Objective: Vital Signs: Blood pressure 110/82, pulse (!) 58, temperature 98.6 F (37 C), temperature source Oral, resp. rate 16, height 5\' 7"  (1.702 m), weight 59.3 kg, SpO2 97 %. No results found. Recent Labs    04/14/19 1013  WBC 15.6*  HGB 13.1  HCT 38.6  PLT 414*   Recent Labs    04/14/19 1013 04/15/19 0617  NA 138 137  K 4.6 3.9  CL 106 102  CO2 22 25  GLUCOSE 114* 97  BUN 15 10  CREATININE 0.53 0.43*  CALCIUM 8.3* 8.4*    Physical Exam: BP 110/82 (BP Location: Left Arm)   Pulse (!) 58   Temp 98.6 F (37 C) (Oral)   Resp 16   Ht 5\' 7"  (1.702 m)   Wt 59.3 kg   SpO2 97%   BMI 20.48 kg/m  Constitutional: No distress . Vital signs reviewed. Frail.  HENT: Normocephalic.  Atraumatic. Eyes: EOMI. No discharge. Cardiovascular: No JVD. Respiratory: Normal effort.  No stridor. GI: Non-distended. Skin: Warm and dry.  Intact. Psych: Normal mood.  Normal behavior. Musc: Right lower extremity with tenderness. Neurological:  Alert Motor: Left upper extremity: 5/5 proximal distal Right upper extremity: Proximally 0/5, handgrip 4-/5 with apraxia Left lower extremity: Hip flexion, knee extension 4 -/5, ankle dorsiflexion 4/5 Right lower extremity: Hip flexion, knee extension 2/5, ankle dorsiflexion 4/5 Lumbar back: Tenderness to palpation over upper lumbar and lower thoracic spine.   Assessment/Plan: 1. Functional deficits secondary to brain and spine metastatic disease which require 3+ hours per day of interdisciplinary therapy in a comprehensive inpatient rehab setting.  Physiatrist is providing close team  supervision and 24 hour management of active medical problems listed below.  Physiatrist and rehab team continue to assess barriers to discharge/monitor patient progress toward functional and medical goals  Care Tool:  Bathing    Body parts bathed by patient: Chest, Abdomen, Front perineal area, Right upper leg, Buttocks, Left upper leg, Right lower leg, Left lower leg, Face   Body parts bathed by helper: Right arm, Left arm     Bathing assist Assist Level: Minimal Assistance - Patient > 75%     Upper Body Dressing/Undressing Upper body dressing   What is the patient wearing?: Pull over shirt    Upper body assist Assist Level: Moderate Assistance - Patient 50 - 74%    Lower Body Dressing/Undressing Lower body dressing      What is the patient wearing?: Pants     Lower body assist Assist for lower body dressing: Minimal Assistance - Patient > 75%     Toileting Toileting Toileting Activity did not occur (Clothing management and hygiene only): N/A (no void or bm)  Toileting assist Assist for toileting: Minimal Assistance - Patient > 75%     Transfers Chair/bed transfer  Transfers assist     Chair/bed transfer assist level: Minimal Assistance - Patient > 75%     Locomotion Ambulation   Ambulation assist      Assist level: Minimal Assistance - Patient > 75% Assistive device: Walker-rolling Max distance: 75 ft   Walk 10 feet activity   Assist  Assist level: Minimal Assistance - Patient > 75% Assistive device: Walker-rolling   Walk 50 feet activity   Assist Walk 50 feet with 2 turns activity did not occur: Safety/medical concerns(Decreased strength/activity tolerance)  Assist level: Minimal Assistance - Patient > 75% Assistive device: Walker-rolling    Walk 150 feet activity   Assist Walk 150 feet activity did not occur: Safety/medical concerns(Decreased strength/activity tolerance)         Walk 10 feet on uneven surface   activity   Assist Walk 10 feet on uneven surfaces activity did not occur: Safety/medical concerns(Decreased strength/activity tolerance)         Wheelchair     Assist   Type of Wheelchair: Manual Wheelchair activity did not occur: Safety/medical concerns(R hemi, patient unable to propel without skilled intervention)  Wheelchair assist level: Supervision/Verbal cueing Max wheelchair distance: 50 ft    Wheelchair 50 feet with 2 turns activity    Assist    Wheelchair 50 feet with 2 turns activity did not occur: Safety/medical concerns(R hemi, patient unable to propel without skilled intervention)   Assist Level: Supervision/Verbal cueing   Wheelchair 150 feet activity     Assist Wheelchair 150 feet activity did not occur: Safety/medical concerns(R hemi, patient unable to propel without skilled intervention)          Medical Problem List and Plan: 1.  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   Continue CIR therapies  PRAFO to right lower extremity to prevent plantarflexion contracture 2.  Antithrombotics: -RLE DVT/anticoagulation:  Pharmaceutical: Therapeutic Lovenox,  Transition to NOAC after discharge             -antiplatelet therapy: N/A 3. Pain Management: Norco prn             Monitor with increased mobility  12/22: In significant pain this morning, present in her back. Added Norco10mg  for severe pain for better pain control and to allow her to participate in therapy.  4. Mood: LCSW to follow for evaluation              -antipsychotic agents: N/A 5. Neuropsych: This patient is capable of making decisions on her own behalf. 6. Skin/Wound Care: Routine pressure relief measures 7. Fluids/Electrolytes/Nutrition: Ensure Enlive po BID for increased nutritional needs due to cancer/hypoalbuminemia, appreciate dietary input.  BMP within acceptable range on 12/20, 12/21 8. Stage 4 adenocarcinoma of  the lung with metastases to the brain and spine:             -Post-op brain radiation to start on Jan 4th.             -Specimen sent for genetic analysis, results should take 7-10 days             Palliative care consulted 9. Spasticity:   Baclofen 5mg  TID for increased tone in right lower extremity, monitor for sedation.  10.  Leukocytosis-likely related to CA  WBCs 15.6 on 12/20, stable 12/21  Afebrile  Continue to monitor 11. Constipation: Will add Senna-Docusate   LOS: 3 days A FACE TO FACE EVALUATION WAS PERFORMED  Clide Deutscher Venia Riveron 04/16/2019, 8:34 AM

## 2019-04-17 ENCOUNTER — Inpatient Hospital Stay (HOSPITAL_COMMUNITY): Payer: BC Managed Care – PPO

## 2019-04-17 ENCOUNTER — Inpatient Hospital Stay (HOSPITAL_COMMUNITY): Payer: BC Managed Care – PPO | Admitting: Physical Therapy

## 2019-04-17 DIAGNOSIS — C7951 Secondary malignant neoplasm of bone: Secondary | ICD-10-CM

## 2019-04-17 DIAGNOSIS — G893 Neoplasm related pain (acute) (chronic): Secondary | ICD-10-CM

## 2019-04-17 DIAGNOSIS — Z515 Encounter for palliative care: Secondary | ICD-10-CM

## 2019-04-17 DIAGNOSIS — Z7189 Other specified counseling: Secondary | ICD-10-CM | POA: Insufficient documentation

## 2019-04-17 MED ORDER — ACETAMINOPHEN 325 MG PO TABS
650.0000 mg | ORAL_TABLET | Freq: Three times a day (TID) | ORAL | Status: DC
  Administered 2019-04-17 – 2019-04-18 (×3): 650 mg via ORAL
  Filled 2019-04-17 (×3): qty 2

## 2019-04-17 MED ORDER — OXYCODONE HCL 5 MG PO TABS
10.0000 mg | ORAL_TABLET | ORAL | Status: DC | PRN
  Administered 2019-04-17 – 2019-04-21 (×22): 10 mg via ORAL
  Filled 2019-04-17 (×22): qty 2

## 2019-04-17 MED ORDER — HYDROCODONE-ACETAMINOPHEN 10-325 MG PO TABS
1.0000 | ORAL_TABLET | ORAL | Status: DC | PRN
  Administered 2019-04-17: 1 via ORAL
  Filled 2019-04-17: qty 1

## 2019-04-17 MED ORDER — DRONABINOL 2.5 MG PO CAPS
5.0000 mg | ORAL_CAPSULE | Freq: Two times a day (BID) | ORAL | Status: DC
  Administered 2019-04-17 – 2019-04-23 (×13): 5 mg via ORAL
  Filled 2019-04-17 (×13): qty 2

## 2019-04-17 MED ORDER — ENOXAPARIN SODIUM 100 MG/ML ~~LOC~~ SOLN
1.5000 mg/kg | SUBCUTANEOUS | Status: DC
  Administered 2019-04-17 – 2019-04-20 (×4): 90 mg via SUBCUTANEOUS
  Filled 2019-04-17 (×5): qty 0.9

## 2019-04-17 MED ORDER — GABAPENTIN 300 MG PO CAPS
300.0000 mg | ORAL_CAPSULE | Freq: Three times a day (TID) | ORAL | Status: DC
  Administered 2019-04-17 – 2019-04-23 (×17): 300 mg via ORAL
  Filled 2019-04-17 (×17): qty 1

## 2019-04-17 NOTE — Progress Notes (Signed)
Patient ID: [name], female   DOB: March 07, 1956, 63 y.o.   MRN: [id number]  This NP visited patient at the bedside as a follow up to yesterday's goals of care meeting, and for palliative medicine needs and emotional support.  Patient is sitting up on the side of the bed and her husband is at bedside.  They verbalize a sense of satisfaction having had the opportunity to talk with Dr. Marin Olp regarding oncology treatment plan and with Lucy/LCSW for transitions of care questions, and spiritual care for advanced directive  documentation.   Outstanding concerns continue around pain management.  [name] tells me that the pain specifically to the right mid to upper side of her back is not well managed.   We reviewed current prescribed medications and I discussed with them specific pain management strategies.  I spoke with [name] [facility] with recommendations--will write orders now and f/u in morning with attending for further collaboration.    Pain management:  - Tylenol 650 mg PO scheduled every 8 hrs - Oxycodone 10 mg PO every 3 hrs prn  ( patient denies any known allergy to codeine)          - discontinue Hydrocodone 5/325 written for every 4 hrs as needed   - Gabapentin 300 mg PO scheduled three times a day (patient tells me this is what she was taking at home and it was  the thing "that really helped" -Discussed utilization of heat vs cold for  pain management  Discussed with nursing,  and this NP will f/u in the morning for efficacy   Questions and concerns addressed     Total time spent on the unit was 60 minutes  Greater than 50% of the time was spent in counseling and coordination of care  Wadie Lessen NP  Palliative Medicine Team Team Phone # [phone] Pager [phone]

## 2019-04-17 NOTE — Progress Notes (Signed)
[place] PHYSICAL MEDICINE & REHABILITATION PROGRESS NOTE  Subjective/Complaints: [name]'s pain is better controlled but she is asking for pain medication every 3.5 hours. Pain is present in her lower thoracic/upper lumbar spine and has a throbbing quality. The pain in her legs is much improved. She appears uncomfortable. She has been performing better in therapy after increasing her pain medication.  ROS: Denies CP, SOB, N/V/D  Objective: Vital Signs: Blood pressure 125/68, pulse (!) 55, temperature 98.2 F (36.8 C), resp. rate 18, height 5\' 7"  (1.702 m), weight 59.3 kg, SpO2 99 %. No results found. No results for input(s): WBC, HGB, HCT, PLT in the last 72 hours. Recent Labs    04/15/19 0617  NA 137  K 3.9  CL 102  CO2 25  GLUCOSE 97  BUN 10  CREATININE 0.43*  CALCIUM 8.4*    Physical Exam: BP 125/68 (BP Location: Left Arm)   Pulse (!) 55   Temp 98.2 F (36.8 C)   Resp 18   Ht 5\' 7"  (1.702 m)   Wt 59.3 kg   SpO2 99%   BMI 20.48 kg/m  Constitutional: No distress . Vital signs reviewed. Frail.  HENT: Normocephalic.  Atraumatic. Eyes: EOMI. No discharge. Cardiovascular: No JVD. Respiratory: Normal effort.  No stridor. GI: Non-distended. Skin: Warm and dry.  Intact. Psych: Normal mood.  Normal behavior. Musc: Right lower extremity with tenderness. Neurological:  Alert Motor: Left upper extremity: 5/5 proximal distal Right upper extremity: Proximally 0/5, handgrip 4-/5 with apraxia Left lower extremity: Hip flexion, knee extension 4 -/5, ankle dorsiflexion 4/5 Right lower extremity: Hip flexion, knee extension 2/5, ankle dorsiflexion 4/5 Lumbar back: Tenderness to palpation over upper lumbar and lower thoracic spine.   Assessment/Plan: 1. Functional deficits secondary to brain and spine metastatic disease which require 3+ hours per day of interdisciplinary therapy in a comprehensive inpatient rehab setting.  Physiatrist is providing close team supervision  and 24 hour management of active medical problems listed below.  Physiatrist and rehab team continue to assess barriers to discharge/monitor patient progress toward functional and medical goals  Care Tool:  Bathing    Body parts bathed by patient: Chest, Abdomen, Front perineal area, Right upper leg, Buttocks, Left upper leg, Right lower leg, Left lower leg, Face   Body parts bathed by helper: Right arm, Left arm     Bathing assist Assist Level: Minimal Assistance - Patient > 75%     Upper Body Dressing/Undressing Upper body dressing   What is the patient wearing?: Pull over shirt    Upper body assist Assist Level: Moderate Assistance - Patient 50 - 74%    Lower Body Dressing/Undressing Lower body dressing      What is the patient wearing?: (patient is not wearing any pants, stated for easy toileting)     Lower body assist Assist for lower body dressing: Minimal Assistance - Patient > 75%     Toileting Toileting Toileting Activity did not occur (Clothing management and hygiene only): N/A (no void or bm)  Toileting assist Assist for toileting: Minimal Assistance - Patient > 75%     Transfers Chair/bed transfer  Transfers assist     Chair/bed transfer assist level: Minimal Assistance - Patient > 75%     Locomotion Ambulation   Ambulation assist      Assist level: Minimal Assistance - Patient > 75% Assistive device: Walker-rolling Max distance: 75 ft   Walk 10 feet activity   Assist     Assist level: Minimal  Assistance - Patient > 75% Assistive device: Walker-rolling   Walk 50 feet activity   Assist Walk 50 feet with 2 turns activity did not occur: Safety/medical concerns(Decreased strength/activity tolerance)  Assist level: Minimal Assistance - Patient > 75% Assistive device: Walker-rolling    Walk 150 feet activity   Assist Walk 150 feet activity did not occur: Safety/medical concerns(Decreased strength/activity tolerance)          Walk 10 feet on uneven surface  activity   Assist Walk 10 feet on uneven surfaces activity did not occur: Safety/medical concerns(Decreased strength/activity tolerance)         Wheelchair     Assist   Type of Wheelchair: Manual Wheelchair activity did not occur: Safety/medical concerns(R hemi, patient unable to propel without skilled intervention)  Wheelchair assist level: Supervision/Verbal cueing Max wheelchair distance: 50 ft    Wheelchair 50 feet with 2 turns activity    Assist    Wheelchair 50 feet with 2 turns activity did not occur: Safety/medical concerns(R hemi, patient unable to propel without skilled intervention)   Assist Level: Supervision/Verbal cueing   Wheelchair 150 feet activity     Assist Wheelchair 150 feet activity did not occur: Safety/medical concerns(R hemi, patient unable to propel without skilled intervention)          Medical Problem List and Plan: 1.  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   Continue CIR therapies  PRAFO to right lower extremity to prevent plantarflexion contracture 2.  Antithrombotics: -RLE DVT/anticoagulation:  Pharmaceutical: Therapeutic Lovenox,  Transition to NOAC after discharge             -antiplatelet therapy: N/A 3. Pain Management: Norco prn             Monitor with increased mobility  12/22: In significant pain this morning, present in her back. Added Norco10mg  for severe pain for better pain control and to allow her to participate in therapy.   12/23: [name]'s pain is better controlled but she is asking for pain medication every 3.5 hours. Pain is present in her lower thoracic/upper lumbar spine and has a throbbing quality. The pain in her legs is much improved. She appears uncomfortable. She has been performing better in therapy after increasing her pain medication. Will increase frequency to [facility]. 4. Mood: LCSW to follow for  evaluation              -antipsychotic agents: N/A 5. Neuropsych: This patient is capable of making decisions on her own behalf. 6. Skin/Wound Care: Routine pressure relief measures 7. Fluids/Electrolytes/Nutrition: Ensure Enlive po BID for increased nutritional needs due to cancer/hypoalbuminemia, appreciate dietary input.  BMP within acceptable range on 12/20, 12/21 8. Stage 4 adenocarcinoma of the lung with metastases to the brain and spine:             -Post-op brain radiation to start on Jan 4th.             -Specimen sent for genetic analysis, results should take 7-10 days             Palliative care consulted 9. Spasticity:   Baclofen 5mg  TID for increased tone in right lower extremity, monitor for sedation.  10.  Leukocytosis-likely related to CA  WBCs 15.6 on 12/20, stable 12/21  Afebrile  Continue to monitor 11. Constipation: Will DC Senna-Docusate since now having regular BM   LOS: 4 days A FACE TO FACE EVALUATION WAS PERFORMED  Shaylan Tutton P Jashad Depaula 04/17/2019, 11:04 AM

## 2019-04-17 NOTE — Progress Notes (Signed)
Chaplain responded to request to complete an Advanced Directive.  The patient wants their husband to be present so the chaplain will follow-up to get the paperwork signed at 3:30 pm if a notary and witnesses are available.  Brion Aliment Chaplain Resident For questions concerning this note please contact me by pager [phone]

## 2019-04-17 NOTE — Plan of Care (Signed)
  Problem: Consults Goal: RH BRAIN INJURY PATIENT EDUCATION Description: Description: See Patient Education module for eduction specifics mod assist Outcome: Progressing Goal: Skin Care Protocol Initiated - if Braden Score 18 or less Description: If consults are not indicated, leave blank or document N/A Outcome: Progressing Goal: Nutrition Consult-if indicated Outcome: Progressing   Problem: RH BOWEL ELIMINATION Goal: RH STG MANAGE BOWEL WITH ASSISTANCE Description: STG Manage Bowel with mod I Assistance. Outcome: Progressing Goal: RH STG MANAGE BOWEL W/MEDICATION W/ASSISTANCE Description: STG Manage Bowel with Medication with mod I Assistance. Outcome: Progressing   Problem: RH BLADDER ELIMINATION Goal: RH STG MANAGE BLADDER WITH ASSISTANCE Description: STG Manage Bladder With mod I Assistance Outcome: Progressing   Problem: RH SKIN INTEGRITY Goal: RH STG MAINTAIN SKIN INTEGRITY WITH ASSISTANCE Description: STG Maintain Skin Integrity With mod I Assistance. Outcome: Progressing   Problem: RH SAFETY Goal: RH STG ADHERE TO SAFETY PRECAUTIONS W/ASSISTANCE/DEVICE Description: STG Adhere to Safety Precautions With Assistance/Device. mod I assistance Outcome: Progressing Goal: RH STG DECREASED RISK OF FALL WITH ASSISTANCE Description: STG Decreased Risk of Fall With cues and reminders Assistance. Outcome: Progressing   Problem: RH COGNITION-NURSING Goal: RH STG ANTICIPATES NEEDS/CALLS FOR ASSIST W/ASSIST/CUES Description: STG Anticipates Needs/Calls for Assist With cues and reminders Outcome: Progressing   Problem: RH PAIN MANAGEMENT Goal: RH STG PAIN MANAGED AT OR BELOW PT'S PAIN GOAL Description: Pain less than 2 Outcome: Progressing   Problem: RH KNOWLEDGE DEFICIT BRAIN INJURY Goal: RH STG INCREASE KNOWLEDGE OF SELF CARE AFTER BRAIN INJURY Description: Pt will be able to demonstrates understanding of proper precautions to take to prevent falls , such as removing  environmental hazards with mod I assist upon discharge.  Outcome: Progressing

## 2019-04-17 NOTE — Progress Notes (Signed)
Occupational Therapy Session Note  Patient Details  Name: [name] MRN: [id number] Date of Birth: [birth year]  Today's Date: 04/17/2019 OT Individual Time: 4401-0272 OT Individual Time Calculation (min): 60 min    Short Term Goals: Week 1:  OT Short Term Goal 1 (Week 1): STGs=LTGs due to ELOS  Skilled Therapeutic Interventions/Progress Updates:    1:1.Pt received in bed agreeable to OT and requesting to use bathroom. Pain nonexistant d/t medication being delivered prior session. Pt completes CGA ambulation with RW to bathroom with BSC over toilet. Pt completes toileting with S for hygiene and pt not wearing pants. Pt completes bathing at sink with HOH A of RUE to wash LUE and maintain WB position in stanidng while pt washes buttocks. Pt dons shirt with S and pants with CGA for pants with VC for hemi dressing. OT dons non skid socks. Exited session with pt seated in recliner, exit alarm on and call light inr each  Therapy Documentation Precautions:  Precautions Precautions: Fall Precaution Comments: Lt crani, pathologic fx L3 Restrictions Weight Bearing Restrictions: No General:   Vital Signs:  Pain: Pain Assessment Pain Scale: 0-10 Pain Score: 3  Faces Pain Scale: Hurts a little bit Pain Type: Acute pain Pain Location: Back Pain Orientation: Left;Upper Pain Radiating Towards: shoulder Pain Descriptors / Indicators: Aching Pain Frequency: Intermittent Pain Onset: On-going Patients Stated Pain Goal: 4 Pain Intervention(s): Medication (See eMAR) Multiple Pain Sites: No ADL: ADL Eating: Not assessed Grooming: Moderate assistance Where Assessed-Grooming: Sitting at sink Upper Body Bathing: Moderate assistance Where Assessed-Upper Body Bathing: Edge of bed Lower Body Bathing: Minimal assistance Where Assessed-Lower Body Bathing: Edge of bed Upper Body Dressing: Moderate assistance(button up shirt) Where Assessed-Upper Body Dressing: Edge of bed Lower Body Dressing:  Minimal assistance Where Assessed-Lower Body Dressing: Edge of bed Toileting: Not assessed Toilet Transfer: Moderate assistance Toilet Transfer Method: Stand pivot Toilet Transfer Equipment: Energy manager: Not assessed Vision   Perception    Praxis   Exercises:   Other Treatments:     Therapy/Group: Individual Therapy  Tonny Branch 04/17/2019, 8:32 AM

## 2019-04-17 NOTE — Progress Notes (Signed)
Physical Therapy Session Note  Patient Details  Name: [name] MRN: [id number] Date of Birth: 10-Aug-1955  Today's Date: 04/17/2019 PT Individual Time: 3785-8850 and 2774-1287 PT Individual Time Calculation (min): 72 min and 53 min   Short Term Goals: Week 1:  PT Short Term Goal 1 (Week 1): STG=LTG due to short ELOS.  Skilled Therapeutic Interventions/Progress Updates:  Treatment 1: Pt received in recliner reporting ongoing dizziness with the slightest movement, nurse & PA made aware. Pt transfers sit>stand with min assist and cuing to scoot out to edge of seat and then to place R hand on orthosis after standing and managing R hand before transferring stand>sit. Pt ambulates ~10 ft to w/c with RW & CGA<>Min assist. In dayroom, pt ambulates ~75 ft with RW & min assist with 2 episodes of LOB but pt able to correct with min assist. Pt requires seated rest break 2/2 L lower back pain, 7-8/10 with pt reporting it's sharp - therapist placed pillow behind pt's back while sitting in w/c with pt reporting this helps & pain meds requested from RN.  Pt then performs seated BLE strengthening exercises: long arc quads with 5# ankle weights on LLE & 3# on RLE, hip abduction with orange theraband, and pt attempts to perform seated hip flexion with ankle weights but reports BLE pain so exercises deferred. Pt attempted to propel w/c with L hemi technique but only able to propel ~5 ft before fatigue so pt assisted back to room. Pt ambulates w/c>bed with RW & min assist with 1 LOB when side stepping to R to [facility]. Pt sat on EOB with supervision then sit>supine with min assist for RLE with pt experiencing significant pain with movement. Pt left in bed with alarm set & all needs at hand, 4 rails up per pt request.  Treatment 2: Pt received in recliner & agreeable to tx. Pt denies pain. Pt completes ambulatory transfer to w/c with RW & min assist. In ortho gym, pt completes ambulatory transfer to simulator car set at  elevated SUV simulated height with instructional cuing for sequencing and pt using UE to assist RLE in/out of car. In dayroom pt utilized cybex kinetron in sitting 1 minute x 2 trials then standing with BUE support and min assist, x 3 trials, with task focusing on dynamic balance, weight shifting, & RLE strengthening & NMR with cuing for upright posture and forward gaze. Pt performed 10x sit<>stand with LUE support and CGA fade to close supervision then 10x sit<>stand without BUE support with mod fade to min assist with task focusing on BLE strengthening. Pt returned to room & completed ambulatory transfer with RW & min assist. Pt left sitting on EOB with bed alarm set, call bell in reach, husband & chaplain in room. At end of session pt reports feeling a small twinge of pain in RLE & rest break provided.  Pt's husband pulled mask below nose - therapist educated him on need to keep mask over nose & mouth at all times & he adjusted it properly.  Therapy Documentation Precautions:  Precautions Precautions: Fall Precaution Comments: Lt crani, pathologic fx L3 Restrictions Weight Bearing Restrictions: No    Therapy/Group: Individual Therapy  Waunita Schooner 04/17/2019, 3:31 PM

## 2019-04-17 NOTE — Consult Note (Signed)
Referral MD  Reason for Referral:  metastatic adenocarcinoma of the lung; thromboembolic disease of the right leg  No chief complaint on file. : I am in rehabilitation right now.  HPI: Ms. [name] is well-known to me.  She is very charming 63 year old white female with metastatic adenocarcinoma of the right lung.  She presented with weakness on the right side.  She had a solitary left brain met that was resected.  She has bilateral lung disease.  She has bony metastasis.  There is adenopathy in the chest.  She is down in [facility] getting rehab right now.  She has weakness in the right arm.  It sounds like the leg might be doing a little bit better.  She was found to have a blood clot in the right leg.  She is on Lovenox.  She will need to be on Lovenox as an outpatient.  I will switch her over to daily Lovenox right now.  She has been seen by Radiation Oncology.  I am not sure when they will start radiation therapy for her.  Think she has had Zometa.  Her appetite is down a little bit.  She has had a little bit of nausea.  I think that some Marinol might help her out.  She is a smoker.  We did send the specimen off for molecular testing to see if there is any actionable mutations that we might be able to target.  She has had no problems with cough.  There is no bleeding.  I did speak to her husband on the phone yesterday.  I gave him a overview as to what is going on with her from my perspective.  It sounds like she might be going home next week.  She has had some pain over on her left side.  Overall, I would say her performance status is ECOG 1.   Past Medical History:  Diagnosis Date  . Bronchitis   . Hypertension   . Osteoarthritis of metacarpophalangeal (MCP) joint of left thumb   . Pelvic ring fracture ([place]) 09/2016   left  :  Past Surgical History:  Procedure Laterality Date  . APPLICATION OF CRANIAL NAVIGATION Left 04/05/2019   Procedure:  APPLICATION OF CRANIAL NAVIGATION;  Surgeon: Earnie Larsson, MD;  Location: [place];  Service: Neurosurgery;  Laterality: Left;  . CRANIOTOMY Left 04/05/2019   Procedure: LEFT FRONTAL CRANIOTOMY TUMOR EXCISION w/Brain Lab;  Surgeon: Earnie Larsson, MD;  Location: [place];  Service: Neurosurgery;  Laterality: Left;  LEFT FRONTAL CRANIOTOMY TUMOR EXCISION w/Brain Lab  :   Current Facility-Administered Medications:  .  acetaminophen (TYLENOL) tablet 325-650 mg, 325-650 mg, Oral, Q4H PRN, Love, Pamela S, PA-C .  alum & mag hydroxide-simeth (MAALOX/MYLANTA) 200-200-20 MG/5ML suspension 30 mL, 30 mL, Oral, Q4H PRN, Love, Pamela S, PA-C .  baclofen (LIORESAL) tablet 5 mg, 5 mg, Oral, TID, Jamse Arn, MD, 5 mg at 04/16/19 2007 .  bisacodyl (DULCOLAX) suppository 10 mg, 10 mg, Rectal, Daily PRN, Love, Pamela S, PA-C .  diphenhydrAMINE (BENADRYL) 12.5 MG/5ML elixir 12.5-25 mg, 12.5-25 mg, Oral, Q6H PRN, Love, Pamela S, PA-C .  diphenhydrAMINE (BENADRYL) capsule 25 mg, 25 mg, Oral, Q8H PRN, Love, Pamela S, PA-C .  dronabinol (MARINOL) capsule 5 mg, 5 mg, Oral, BID AC, Mantaj Chamberlin, Rudell Cobb, MD .  enoxaparin (LOVENOX) injection 60 mg, 1 mg/kg, Subcutaneous, BID, Love, Pamela S, PA-C, 60 mg at 04/16/19 2006 .  feeding supplement (ENSURE ENLIVE) (ENSURE ENLIVE) liquid 237  mL, 237 mL, Oral, BID BM, Love, Pamela S, PA-C, 237 mL at 04/16/19 1307 .  gabapentin (NEURONTIN) capsule 300 mg, 300 mg, Oral, TID PRN, Love, Pamela S, PA-C, 300 mg at 04/14/19 0802 .  guaiFENesin-dextromethorphan (ROBITUSSIN DM) 100-10 MG/5ML syrup 5-10 mL, 5-10 mL, Oral, Q6H PRN, Love, Pamela S, PA-C .  HYDROcodone-acetaminophen (NORCO) 10-325 MG per tablet 1 tablet, 1 tablet, Oral, Q4H PRN, Raulkar, Clide Deutscher, MD, 1 tablet at 04/17/19 0543 .  HYDROcodone-acetaminophen (NORCO/VICODIN) 5-325 MG per tablet 1 tablet, 1 tablet, Oral, Q4H PRN, Love, Pamela S, PA-C, 1 tablet at 04/16/19 0744 .  levETIRAcetam (KEPPRA) tablet 500 mg, 500 mg, Oral, BID,  Love, Pamela S, PA-C, 500 mg at 04/16/19 2007 .  naloxone [facility]) injection 0.08 mg, 0.08 mg, Intravenous, PRN, Love, Pamela S, PA-C .  nicotine (NICODERM CQ - dosed in mg/24 hr) patch 7 mg, 7 mg, Transdermal, Daily, Love, Pamela S, PA-C .  ondansetron (ZOFRAN) tablet 4 mg, 4 mg, Oral, Q4H PRN **OR** ondansetron (ZOFRAN) injection 4 mg, 4 mg, Intravenous, Q4H PRN, Love, Pamela S, PA-C .  pantoprazole (PROTONIX) EC tablet 40 mg, 40 mg, Oral, Q supper, Love, Pamela S, PA-C, 40 mg at 04/16/19 1710 .  polyethylene glycol (MIRALAX / GLYCOLAX) packet 17 g, 17 g, Oral, Daily PRN, Bary Leriche, PA-C, 17 g at 04/15/19 0750 .  prochlorperazine (COMPAZINE) tablet 5-10 mg, 5-10 mg, Oral, Q6H PRN **OR** prochlorperazine (COMPAZINE) injection 5-10 mg, 5-10 mg, Intramuscular, Q6H PRN **OR** prochlorperazine (COMPAZINE) suppository 12.5 mg, 12.5 mg, Rectal, Q6H PRN, Love, Pamela S, PA-C .  promethazine (PHENERGAN) tablet 12.5-25 mg, 12.5-25 mg, Oral, Q4H PRN, Love, Pamela S, PA-C .  senna-docusate (Senokot-S) tablet 2 tablet, 2 tablet, Oral, QHS, Love, Pamela S, PA-C, 2 tablet at 04/15/19 1719 .  sodium phosphate (FLEET) 7-19 GM/118ML enema 1 enema, 1 enema, Rectal, Once PRN, Love, Pamela S, PA-C .  traZODone (DESYREL) tablet 25-50 mg, 25-50 mg, Oral, QHS PRN, Love, Pamela S, PA-C, 50 mg at 04/15/19 2014:  . baclofen  5 mg Oral TID  . dronabinol  5 mg Oral BID AC  . enoxaparin (LOVENOX) injection  1 mg/kg Subcutaneous BID  . feeding supplement (ENSURE ENLIVE)  237 mL Oral BID BM  . levETIRAcetam  500 mg Oral BID  . nicotine  7 mg Transdermal Daily  . pantoprazole  40 mg Oral Q supper  . senna-docusate  2 tablet Oral QHS  :  Allergies  Allergen Reactions  . Codeine   . Lisinopril-Hydrochlorothiazide Other (See Comments)    Back pain  :  Family History  Problem Relation Age of Onset  . CAD Father        CABG  . Stroke Father   . Cancer Brother        lymphoma  :  Social History    Socioeconomic History  . Marital status: Married    Spouse name: Not on file  . Number of children: Not on file  . Years of education: Not on file  . Highest education level: Not on file  Occupational History  . Not on file  Tobacco Use  . Smoking status: Current Every Day Smoker    Packs/day: 0.50    Years: 25.00    Pack years: 12.50    Types: Cigarettes  . Smokeless tobacco: Never Used  Substance and Sexual Activity  . Alcohol use: No    Alcohol/week: 0.0 standard drinks  . Drug use: No  . Sexual activity:  Yes    Birth control/protection: Post-menopausal  Other Topics Concern  . Not on file  Social History Narrative  . Not on file   Social Determinants of Health   Financial Resource Strain:   . Difficulty of Paying Living Expenses: Not on file  Food Insecurity:   . Worried About Charity fundraiser in the Last Year: Not on file  . Ran Out of Food in the Last Year: Not on file  Transportation Needs:   . Lack of Transportation (Medical): Not on file  . Lack of Transportation (Non-Medical): Not on file  Physical Activity:   . Days of Exercise per Week: Not on file  . Minutes of Exercise per Session: Not on file  Stress:   . Feeling of Stress : Not on file  Social Connections:   . Frequency of Communication with Friends and Family: Not on file  . Frequency of Social Gatherings with Friends and Family: Not on file  . Attends Religious Services: Not on file  . Active Member of Clubs or Organizations: Not on file  . Attends Archivist Meetings: Not on file  . Marital Status: Not on file  Intimate Partner Violence:   . Fear of Current or Ex-Partner: Not on file  . Emotionally Abused: Not on file  . Physically Abused: Not on file  . Sexually Abused: Not on file  : Review of Systems  Constitutional: Negative.   HENT: Negative.   Eyes: Negative.   Respiratory: Negative.   Cardiovascular: Negative.   Gastrointestinal: Positive for nausea.   Genitourinary: Negative.   Musculoskeletal: Positive for back pain and joint pain.  Skin: Negative.   Neurological: Positive for focal weakness.  Endo/Heme/Allergies: Negative.   Psychiatric/Behavioral: Negative.      Exam: Well-developed well-nourished white female in no obvious distress.  Vital signs show temperature of 98.2.  Pulse 55.  Blood pressure 125/68.  Her weight is 130 pounds.  Her head neck exam shows no ocular or oral lesions.  There is no thrush.  She has a healing scar in the left skull.  There is no adenopathy in the neck.  Lungs are clear bilaterally.  Cardiac exam regular rate and rhythm with no murmurs, rubs or bruits.  Abdomen is soft.  She has good bowel sounds.  There is no fluid wave.  There is no palpable liver or spleen tip.  Extremities show some slight swelling in the right leg.  I cannot palpate any venous cord.  There is weakness over in the right arm and right leg.  Skin exam shows no rashes.    Patient Vitals for the past 24 hrs:  BP Temp Temp src Pulse Resp SpO2  04/17/19 0328 125/68 98.2 F (36.8 C) -- (!) 55 18 99 %  04/16/19 1348 114/82 97.8 F (36.6 C) Oral 67 18 100 %  04/16/19 1114 114/82 -- -- 63 -- --     Recent Labs    04/14/19 1013  WBC 15.6*  HGB 13.1  HCT 38.6  PLT 414*   Recent Labs    04/14/19 1013 04/15/19 0617  NA 138 137  K 4.6 3.9  CL 106 102  CO2 22 25  GLUCOSE 114* 97  BUN 15 10  CREATININE 0.53 0.43*  CALCIUM 8.3* 8.4*    Blood smear review: None  Pathology: See above    Assessment and Plan: Ms. Battisti is a very nice 63 year old white female.  She has metastatic adenocarcinoma of the lung.  We are awaiting the molecular studies.  I would think though that the chance of the cancer have been an actionable mutation would be less than 10%.  She needs radiation therapy to the brain postop.  She may need radiosurgery for her bone mets.  She has a thrombus in the right leg.  This seems to be doing a little bit  better.  We will switch her over to daily Lovenox now.  Hopefully, the Marinol will help with her appetite.  I will follow along as needed.  I know that she is getting fantastic care from all the staff in the Inpatient Rehab unit.  I appreciate all of the care that she is getting.  Lattie Haw, MD  Oswaldo Milian 9:6

## 2019-04-18 ENCOUNTER — Inpatient Hospital Stay (HOSPITAL_COMMUNITY): Payer: BC Managed Care – PPO

## 2019-04-18 ENCOUNTER — Inpatient Hospital Stay (HOSPITAL_COMMUNITY): Payer: BC Managed Care – PPO | Admitting: Physical Therapy

## 2019-04-18 MED ORDER — OXYCODONE HCL ER 10 MG PO T12A
10.0000 mg | EXTENDED_RELEASE_TABLET | Freq: Two times a day (BID) | ORAL | Status: DC
  Administered 2019-04-18 – 2019-04-19 (×3): 10 mg via ORAL
  Filled 2019-04-18 (×3): qty 1

## 2019-04-18 MED ORDER — ACETAMINOPHEN 325 MG PO TABS
650.0000 mg | ORAL_TABLET | Freq: Four times a day (QID) | ORAL | Status: DC
  Administered 2019-04-18 – 2019-04-23 (×21): 650 mg via ORAL
  Filled 2019-04-18 (×21): qty 2

## 2019-04-18 NOTE — Progress Notes (Signed)
Occupational Therapy Session Note  Patient Details  Name: [name] MRN: [id number] Date of Birth: Mar 16, 1956  Today's Date: 04/18/2019 OT Individual Time: 1340-1440 OT Individual Time Calculation (min): 60 min    Short Term Goals: Week 1:  OT Short Term Goal 1 (Week 1): STGs=LTGs due to ELOS  Skilled Therapeutic Interventions/Progress Updates:    1;1. Pt received in recliner with husband present. Pt reporting neck and shoulder pain. K tape applied along deltoid, supraspinatus and anterior shoulder to increase activation and decrease potential subluxation. Pt completes 3 cervical stretches targeting scalene, upper trap and levator scapulae with handout with words and pictures. Pt reporting pain relief with stretches. Pt completes stand pivot with RW with CGA to transfer to w/c. In tx gym pt completes LUE therex with UB ranger with scap input/moblization for improved activation shoulder flex/ext, horizontal ab/add and elbow flex/ext. Pt very excited able to activate elbow flex/ext with only support at elbow. Other movements require MAX facilitation. Exited session with pt seated EOB with husband present, exit alarm on and call light in reach  Therapy Documentation Precautions:  Precautions Precautions: Fall Precaution Comments: Lt crani, pathologic fx L3 Restrictions Weight Bearing Restrictions: No General:   Vital Signs: Therapy Vitals Temp: 98.6 F (37 C) Pulse Rate: 67 Resp: 16 BP: 121/78 Patient Position (if appropriate): Sitting Oxygen Therapy SpO2: 100 % Pain:   ADL: ADL Eating: Not assessed Grooming: Moderate assistance Where Assessed-Grooming: Sitting at sink Upper Body Bathing: Moderate assistance Where Assessed-Upper Body Bathing: Edge of bed Lower Body Bathing: Minimal assistance Where Assessed-Lower Body Bathing: Edge of bed Upper Body Dressing: Moderate assistance(button up shirt) Where Assessed-Upper Body Dressing: Edge of bed Lower Body Dressing:  Minimal assistance Where Assessed-Lower Body Dressing: Edge of bed Toileting: Not assessed Toilet Transfer: Moderate assistance Toilet Transfer Method: Stand pivot Toilet Transfer Equipment: Energy manager: Not assessed Vision   Perception    Praxis   Exercises:   Other Treatments:     Therapy/Group: Individual Therapy  Tonny Branch 04/18/2019, 2:43 PM

## 2019-04-18 NOTE — Plan of Care (Signed)
  Problem: Consults Goal: RH BRAIN INJURY PATIENT EDUCATION Description: Description: See Patient Education module for eduction specifics mod assist Outcome: Progressing Goal: Skin Care Protocol Initiated - if Braden Score 18 or less Description: If consults are not indicated, leave blank or document N/A Outcome: Progressing Goal: Nutrition Consult-if indicated Outcome: Progressing   Problem: RH BOWEL ELIMINATION Goal: RH STG MANAGE BOWEL WITH ASSISTANCE Description: STG Manage Bowel with mod I Assistance. Outcome: Progressing Goal: RH STG MANAGE BOWEL W/MEDICATION W/ASSISTANCE Description: STG Manage Bowel with Medication with mod I Assistance. Outcome: Progressing   Problem: RH BLADDER ELIMINATION Goal: RH STG MANAGE BLADDER WITH ASSISTANCE Description: STG Manage Bladder With mod I Assistance Outcome: Progressing   Problem: RH SKIN INTEGRITY Goal: RH STG MAINTAIN SKIN INTEGRITY WITH ASSISTANCE Description: STG Maintain Skin Integrity With mod I Assistance. Outcome: Progressing   Problem: RH SAFETY Goal: RH STG ADHERE TO SAFETY PRECAUTIONS W/ASSISTANCE/DEVICE Description: STG Adhere to Safety Precautions With Assistance/Device. mod I assistance Outcome: Progressing Goal: RH STG DECREASED RISK OF FALL WITH ASSISTANCE Description: STG Decreased Risk of Fall With cues and reminders Assistance. Outcome: Progressing   Problem: RH COGNITION-NURSING Goal: RH STG ANTICIPATES NEEDS/CALLS FOR ASSIST W/ASSIST/CUES Description: STG Anticipates Needs/Calls for Assist With cues and reminders Outcome: Progressing   Problem: RH PAIN MANAGEMENT Goal: RH STG PAIN MANAGED AT OR BELOW PT'S PAIN GOAL Description: Pain less than 2 Outcome: Progressing   Problem: RH KNOWLEDGE DEFICIT BRAIN INJURY Goal: RH STG INCREASE KNOWLEDGE OF SELF CARE AFTER BRAIN INJURY Description: Pt will be able to demonstrates understanding of proper precautions to take to prevent falls , such as removing  environmental hazards with mod I assist upon discharge.  Outcome: Progressing

## 2019-04-18 NOTE — Progress Notes (Signed)
Patient ID: [name], female   DOB: 02/17/1956, 63 y.o.   MRN: [id number]  This NP visited patient at the bedside as a follow up for palliative medicine needs, emotional support and to review medication changes for pain management.  Patient is out of bed to the wheelchair and dressing herself for the start of her day.  She tells me that the medication changes yesterday "seem to have made a difference" and she reports a better night sleep last night.  She has used three doses of Oxycodone since midnight.  Spoke  with Dr. Ranell Patrick in collaboration with Palliative and Rehab services  Pain management recommendations as discussed with Dr. Ranell Patrick  -Increase Tylenol 650 mg PO scheduled every 6 hrs -Initiate extended release oxycodone with immediate release for breakthrough pain - Oxycodone 10 mg PO every 3 hrs prn  ( patient denies any known allergy to codeine)          - discontinue Hydrocodone 5/325 written for every 4 hrs as needed   -Continue gabapentin 300 mg PO scheduled three times a day (patient tells me this is what she was taking at home and it was  the thing "that really helped"  Dr. Ranell Patrick agrees to be prescribing physician for patient into the outpatient setting until she is able to be established with oncology  Return to the room later in the afternoon to  educate both patient and her husband who is at her bedside regarding this pain management strategy.  Both are in agreement and feel that the changes have been helpful.  This nurse practitioner informed  the patient/family and the attending that I will be out of the hospital until Monday morning.  If the patient is still hospitalized I will follow-up at that time.  Call palliative medicine team phone # [phone] with questions or concerns.   Total time spent on the unit was 35 minutes  Greater than 50% of the time was spent in counseling and coordination of care  Wadie Lessen NP  Palliative Medicine Team Team Phone # [phone] Pager [phone]

## 2019-04-18 NOTE — Progress Notes (Addendum)
Physical Therapy Session Note  Patient Details  Name: [name] MRN: [id number] Date of Birth: 24-Jul-1955  Today's Date: 04/18/2019 PT Individual Time: 1052-1200 PT Individual Time Calculation (min): 68 min   Short Term Goals: Week 1:  PT Short Term Goal 1 (Week 1): STG=LTG due to short ELOS.  Skilled Therapeutic Interventions/Progress Updates:  Pt received in recliner & agreeable to tx. Pt reports significant stiffness in R side of neck & pain in thoracic region of L side of back - nurse made aware and reports pt cannot receive pain meds until noon, rest breaks provided during session & pt reports increased neck comfort with R shoulder elevated - will discuss use of sling with OT today. Pt completes sit<>stand transfers throughout session from recliner in room, w/c, and recliner in apartment with supervision and extra time PRN. Pt demonstrates improvement in managing R hand on orthosis and completes ambulatory transfers with RW & supervision. Pt completes bed mobility with supervision and instructional cuing for technique for sit<>R sidelying. Pt negotiates ramp in ortho gym with RW & close supervision. Pt negotiates 24 steps (6" + 3") with L ascending & descending rail with min assist & instructional cuing for compensatory pattern. Recommended pt use ramp to access house at this time 2/2 having L ascending/R descending rail until she can practice this with HHPT & pt voiced agreement. Pt ambulates 53 ft with RW & close supervision, appearing to bear more weight through RLE on this date but reporting significant "throbbing" back pain after task & rest break provided. Back in room pt ambulates to recliner in same manner as noted above. Pt left in recliner with chair alarm donned, all needs in reach, & RUE elevated on pillows, K-pad that was already in room applied to L side of back.  Discussed DME recommendations with pt (transport w/c, RW) & need to minimize use of lift chair at home.   Therapy  Documentation Precautions:  Precautions Precautions: Fall Precaution Comments: Lt crani, pathologic fx L3 Restrictions Weight Bearing Restrictions: No   Therapy/Group: Individual Therapy  Waunita Schooner 04/18/2019, 12:12 PM

## 2019-04-18 NOTE — Progress Notes (Signed)
Physical Therapy Session Note  Patient Details  Name: [name] MRN: [id number] Date of Birth: October 11, 1955  Today's Date: 04/18/2019 PT Individual Time: 0825-0940 PT Individual Time Calculation (min): 75 min   Short Term Goals: Week 1:  PT Short Term Goal 1 (Week 1): STG=LTG due to short ELOS.  Skilled Therapeutic Interventions/Progress Updates:   Basic transfers with CGA overall with RW and cues for RUE hand placement and management during transitions. Dynamic gait through obstacle course with navigating turns and stepping over simulated threshold with RW x 2 with overall CGA and extra time for management of RW over threshold due to RUE weakness, cues for technique. NMR to focus on RUE muscle activation for cup manipulation with UE supported to decrease gravity with PT assist and at table top including reaching across midline for trunk rotation. Coordination, balance, and postural control re-training for toe taps to 4" step with UE support x 10 reps x 2 sets each with seated rest breaks. Education on importance of upright mobility despite fatigue as well as monitoring for energy conservation. Blocked practice sit <> Stands while holding beach ball to engage RUE (with assist) and decrease reliance on UE's while addressing graded movement during transitions for sit <> stands with min assist overall x 5 reps. Pt very motivated to improve and demonstrated some various RUE exercises/activities she can work on in the room independently or with her husband's assist. End of session transferred to recliner with CGA overall and set up with all needs in reach.   Therapy Documentation Precautions:  Precautions Precautions: Fall Precaution Comments: Lt crani, pathologic fx L3 Restrictions Weight Bearing Restrictions: No  Pain: C/o 6/10 pain in L thoracic region and R upper trap area. Medication given by RN during session and offered heating pad at end of session but declined.    Therapy/Group:  Individual Therapy  [name], PT, DPT, CBIS  04/18/2019, 11:41 AM

## 2019-04-18 NOTE — Progress Notes (Signed)
[place] [facility] PHYSICAL MEDICINE & REHABILITATION PROGRESS NOTE  Subjective/Complaints: Mrs. Hundertmark's pain is better controlled today after palliative care scheduled her tylenol and gabapentin yesterday. Pain is present in her lower thoracic/upper lumbar spine and has a throbbing quality. The pain in her legs is much improved. She has been performing better in therapy after increasing her pain medication. Spoke with palliative care today, and they also recommended adding long-acting pain control as well.   ROS: Denies CP, SOB, N/V/D  Objective: Vital Signs: Blood pressure 132/62, pulse (!) 58, temperature 98.5 F (36.9 C), resp. rate 18, height 5\' 7"  (1.702 m), weight 59.3 kg, SpO2 100 %. No results found. No results for input(s): WBC, HGB, HCT, PLT in the last 72 hours. No results for input(s): NA, K, CL, CO2, GLUCOSE, BUN, CREATININE, CALCIUM in the last 72 hours.  Physical Exam: BP 132/62 (BP Location: Left Arm)   Pulse (!) 58   Temp 98.5 F (36.9 C)   Resp 18   Ht 5\' 7"  (1.702 m)   Wt 59.3 kg   SpO2 100%   BMI 20.48 kg/m  Constitutional: No distress . Vital signs reviewed. Frail.  HENT: Normocephalic.  Atraumatic. Eyes: EOMI. No discharge. Cardiovascular: No JVD. Respiratory: Normal effort.  No stridor. GI: Non-distended. Skin: Warm and dry.  Intact. Psych: Normal mood.  Normal behavior. Musc: Right lower extremity with tenderness. Neurological:  Alert Motor: Left upper extremity: 5/5 proximal distal Right upper extremity: Proximally 0/5, handgrip 4-/5 with apraxia Left lower extremity: Hip flexion, knee extension 4 -/5, ankle dorsiflexion 4/5 Right lower extremity: Hip flexion, knee extension 2/5, ankle dorsiflexion 4/5 Lumbar back: Tenderness to palpation over upper lumbar and lower thoracic spine.   Assessment/Plan: 1. Functional deficits secondary to brain and spine metastatic disease which require 3+ hours per day of interdisciplinary therapy in a comprehensive  inpatient rehab setting.  Physiatrist is providing close team supervision and 24 hour management of active medical problems listed below.  Physiatrist and rehab team continue to assess barriers to discharge/monitor patient progress toward functional and medical goals  Care Tool:  Bathing    Body parts bathed by patient: Chest, Abdomen, Front perineal area, Right upper leg, Buttocks, Left upper leg, Right lower leg, Left lower leg, Face   Body parts bathed by helper: Right arm, Left arm     Bathing assist Assist Level: Minimal Assistance - Patient > 75%     Upper Body Dressing/Undressing Upper body dressing   What is the patient wearing?: Pull over shirt    Upper body assist Assist Level: Moderate Assistance - Patient 50 - 74%    Lower Body Dressing/Undressing Lower body dressing      What is the patient wearing?: (patient is not wearing any pants, stated for easy toileting)     Lower body assist Assist for lower body dressing: Minimal Assistance - Patient > 75%     Toileting Toileting Toileting Activity did not occur (Clothing management and hygiene only): N/A (no void or bm)  Toileting assist Assist for toileting: Minimal Assistance - Patient > 75%     Transfers Chair/bed transfer  Transfers assist     Chair/bed transfer assist level: Minimal Assistance - Patient > 75%     Locomotion Ambulation   Ambulation assist      Assist level: Minimal Assistance - Patient > 75% Assistive device: Walker-rolling Max distance: 75 ft   Walk 10 feet activity   Assist     Assist level: Minimal Assistance - Patient >  75% Assistive device: Walker-rolling   Walk 50 feet activity   Assist Walk 50 feet with 2 turns activity did not occur: Safety/medical concerns(Decreased strength/activity tolerance)  Assist level: Minimal Assistance - Patient > 75% Assistive device: Walker-rolling    Walk 150 feet activity   Assist Walk 150 feet activity did not occur:  Safety/medical concerns(Decreased strength/activity tolerance)         Walk 10 feet on uneven surface  activity   Assist Walk 10 feet on uneven surfaces activity did not occur: Safety/medical concerns(Decreased strength/activity tolerance)         Wheelchair     Assist   Type of Wheelchair: Manual Wheelchair activity did not occur: Safety/medical concerns(R hemi, patient unable to propel without skilled intervention)  Wheelchair assist level: Supervision/Verbal cueing Max wheelchair distance: 50 ft    Wheelchair 50 feet with 2 turns activity    Assist    Wheelchair 50 feet with 2 turns activity did not occur: Safety/medical concerns(R hemi, patient unable to propel without skilled intervention)   Assist Level: Supervision/Verbal cueing   Wheelchair 150 feet activity     Assist Wheelchair 150 feet activity did not occur: Safety/medical concerns(R hemi, patient unable to propel without skilled intervention)          Medical Problem List and Plan: 1.  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   Continue CIR therapies  PRAFO to right lower extremity to prevent plantarflexion contracture 2.  Antithrombotics: -RLE DVT/anticoagulation:  Pharmaceutical: Therapeutic Lovenox,  Transition to NOAC after discharge             -antiplatelet therapy: N/A 3. Pain Management: Norco prn             Monitor with increased mobility  12/22: In significant pain this morning, present in her back. Added Norco10mg  for severe pain for better pain control and to allow her to participate in therapy.   12/23: Mrs. Eichinger's pain is better controlled but she is asking for pain medication every 3.5 hours. Pain is present in her lower thoracic/upper lumbar spine and has a throbbing quality. The pain in her legs is much improved. She appears uncomfortable. She has been performing better in therapy after increasing her pain  medication. Will increase frequency to [facility].  12/24: Mrs. Pietila's pain is better controlled today after palliative care scheduled her tylenol and gabapentin yesterday. Spoke with palliative care today, and they also recommended adding long-acting pain control as well. I have added Oxycontin 10mg  q12H.  4. Mood: LCSW to follow for evaluation              -antipsychotic agents: N/A 5. Neuropsych: This patient is capable of making decisions on her own behalf. 6. Skin/Wound Care: Routine pressure relief measures 7. Fluids/Electrolytes/Nutrition: Ensure Enlive po BID for increased nutritional needs due to cancer/hypoalbuminemia, appreciate dietary input.  BMP within acceptable range on 12/20, 12/21 8. Stage 4 adenocarcinoma of the lung with metastases to the brain and spine:             -Post-op brain radiation to start on Jan 4th.             -Specimen sent for genetic analysis, results should take 7-10 days             Palliative care consulted; input appreciated.  9. Spasticity:   Baclofen 5mg  TID for increased tone in right lower extremity, monitor for sedation.  10.  Leukocytosis-likely related to CA  WBCs 15.6 on 12/20, stable 12/21  Afebrile  Continue to monitor 11. Constipation: Will DC Senna-Docusate since now having regular BM   LOS: 5 days A FACE TO FACE EVALUATION WAS PERFORMED  Clide Deutscher Rosia Syme 04/18/2019, 10:43 AM

## 2019-04-19 MED ORDER — OXYCODONE HCL ER 15 MG PO T12A
15.0000 mg | EXTENDED_RELEASE_TABLET | Freq: Two times a day (BID) | ORAL | Status: DC
  Administered 2019-04-19 – 2019-04-20 (×2): 15 mg via ORAL
  Filled 2019-04-19 (×2): qty 1

## 2019-04-19 NOTE — Progress Notes (Signed)
[place] PHYSICAL MEDICINE & REHABILITATION PROGRESS NOTE  Subjective/Complaints: Mrs. Fordham's pain is better controlled today, but she still continues to request IR Oxy before next dose.  Pain is present in her lower thoracic/upper lumbar spine and has a throbbing quality.  Husband will be visiting for Christmas today and she plans to practice her exercises with him.    ROS: Denies CP, SOB, N/V/D  Objective: Vital Signs: Blood pressure 129/79, pulse 64, temperature 98 F (36.7 C), resp. rate 16, height 5\' 7"  (1.702 m), weight 59.3 kg, SpO2 96 %. No results found. No results for input(s): WBC, HGB, HCT, PLT in the last 72 hours. No results for input(s): NA, K, CL, CO2, GLUCOSE, BUN, CREATININE, CALCIUM in the last 72 hours.  Physical Exam: BP 129/79 (BP Location: Left Arm)   Pulse 64   Temp 98 F (36.7 C)   Resp 16   Ht 5\' 7"  (1.702 m)   Wt 59.3 kg   SpO2 96%   BMI 20.48 kg/m  Constitutional: No distress . Vital signs reviewed. Frail.  HENT: Normocephalic.  Atraumatic. Eyes: EOMI. No discharge. Cardiovascular: No JVD. Respiratory: Normal effort.  No stridor. GI: Non-distended. Skin: Warm and dry.  Intact. Psych: Normal mood.  Normal behavior. Musc: Right lower extremity with tenderness. Neurological:  Alert Motor: Left upper extremity: 5/5 proximal distal Right upper extremity: Proximally 0/5, handgrip 4/5 with apraxia Left lower extremity: Hip flexion, knee extension 4 -/5, ankle dorsiflexion 4/5 Right lower extremity: Hip flexion, knee extension 2/5, ankle dorsiflexion 4/5 Lumbar back: Tenderness to palpation over upper lumbar and lower thoracic spine.   Assessment/Plan: 1. Functional deficits secondary to brain and spine metastatic disease which require 3+ hours per day of interdisciplinary therapy in a comprehensive inpatient rehab setting.  Physiatrist is providing close team supervision and 24 hour management of active medical problems listed  below.  Physiatrist and rehab team continue to assess barriers to discharge/monitor patient progress toward functional and medical goals  Care Tool:  Bathing    Body parts bathed by patient: Chest, Abdomen, Front perineal area, Right upper leg, Buttocks, Left upper leg, Right lower leg, Left lower leg, Face   Body parts bathed by helper: Right arm, Left arm     Bathing assist Assist Level: Minimal Assistance - Patient > 75%     Upper Body Dressing/Undressing Upper body dressing   What is the patient wearing?: Pull over shirt    Upper body assist Assist Level: Moderate Assistance - Patient 50 - 74%    Lower Body Dressing/Undressing Lower body dressing      What is the patient wearing?: (patient is not wearing any pants, stated for easy toileting)     Lower body assist Assist for lower body dressing: Minimal Assistance - Patient > 75%     Toileting Toileting Toileting Activity did not occur (Clothing management and hygiene only): N/A (no void or bm)  Toileting assist Assist for toileting: Minimal Assistance - Patient > 75%     Transfers Chair/bed transfer  Transfers assist     Chair/bed transfer assist level: Supervision/Verbal cueing     Locomotion Ambulation   Ambulation assist      Assist level: Supervision/Verbal cueing Assistive device: Walker-rolling Max distance: 70 ft   Walk 10 feet activity   Assist     Assist level: Supervision/Verbal cueing Assistive device: Walker-rolling, Orthosis   Walk 50 feet activity   Assist Walk 50 feet with 2 turns activity did not occur: Safety/medical concerns(Decreased strength/activity  tolerance)  Assist level: Supervision/Verbal cueing Assistive device: Walker-rolling, Orthosis    Walk 150 feet activity   Assist Walk 150 feet activity did not occur: Safety/medical concerns(Decreased strength/activity tolerance)         Walk 10 feet on uneven surface  activity   Assist Walk 10 feet on  uneven surfaces activity did not occur: Safety/medical concerns(Decreased strength/activity tolerance)         Wheelchair     Assist   Type of Wheelchair: Manual Wheelchair activity did not occur: Safety/medical concerns(R hemi, patient unable to propel without skilled intervention)  Wheelchair assist level: Supervision/Verbal cueing Max wheelchair distance: 50 ft    Wheelchair 50 feet with 2 turns activity    Assist    Wheelchair 50 feet with 2 turns activity did not occur: Safety/medical concerns(R hemi, patient unable to propel without skilled intervention)   Assist Level: Supervision/Verbal cueing   Wheelchair 150 feet activity     Assist Wheelchair 150 feet activity did not occur: Safety/medical concerns(R hemi, patient unable to propel without skilled intervention)          Medical Problem List and Plan: 1.  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   Continue CIR therapies  PRAFO to right lower extremity to prevent plantarflexion contracture 2.  Antithrombotics: -RLE DVT/anticoagulation:  Pharmaceutical: Therapeutic Lovenox,  Transition to NOAC after discharge             -antiplatelet therapy: N/A 3. Pain Management: Norco prn             Monitor with increased mobility  12/22: In significant pain this morning, present in her back. Added Norco10mg  for severe pain for better pain control and to allow her to participate in therapy.   12/23: Mrs. Calderone's pain is better controlled but she is asking for pain medication every 3.5 hours. Pain is present in her lower thoracic/upper lumbar spine and has a throbbing quality. The pain in her legs is much improved. She appears uncomfortable. She has been performing better in therapy after increasing her pain medication. Will increase frequency to [facility].  12/24: Mrs. Tseng's pain is better controlled today after palliative care scheduled her tylenol and  gabapentin yesterday. Spoke with palliative care today, and they also recommended adding long-acting pain control as well. I have added Oxycontin 10mg  q12H.   12/25: Since she is still requesting IR oxy prior to next dose, I will increased Oxycontin to 15mg  BID 4. Mood: LCSW to follow for evaluation              -antipsychotic agents: N/A 5. Neuropsych: This patient is capable of making decisions on her own behalf. 6. Skin/Wound Care: Routine pressure relief measures 7. Fluids/Electrolytes/Nutrition: Ensure Enlive po BID for increased nutritional needs due to cancer/hypoalbuminemia, appreciate dietary input.  BMP within acceptable range on 12/20, 12/21 8. Stage 4 adenocarcinoma of the lung with metastases to the brain and spine:             -Post-op brain radiation to start on Jan 4th.             -Specimen sent for genetic analysis, results should take 7-10 days             Palliative care consulted; input appreciated.  9. Spasticity:   Baclofen 5mg  TID for increased tone in right lower extremity, monitor for sedation.  10.  Leukocytosis-likely related to CA  WBCs 15.6 on 12/20,  stable 12/21  Afebrile  Continue to monitor 11. Constipation: Will DC Senna-Docusate since now having regular BM   LOS: 6 days A FACE TO FACE EVALUATION WAS PERFORMED  Clide Deutscher Samarth Ogle 04/19/2019, 8:39 AM

## 2019-04-19 NOTE — Progress Notes (Signed)
Pt had good day today. Pt and husband worked together in room with exercises that were left for her to do. Pt pain seemed to stay around 6 or 7 out of 10 with PRN oxy being administered as needed with good results.

## 2019-04-20 ENCOUNTER — Inpatient Hospital Stay (HOSPITAL_COMMUNITY): Payer: BC Managed Care – PPO

## 2019-04-20 ENCOUNTER — Inpatient Hospital Stay (HOSPITAL_COMMUNITY): Payer: BC Managed Care – PPO | Admitting: Occupational Therapy

## 2019-04-20 ENCOUNTER — Other Ambulatory Visit: Payer: Self-pay

## 2019-04-20 MED ORDER — OXYCODONE HCL ER 10 MG PO T12A
20.0000 mg | EXTENDED_RELEASE_TABLET | Freq: Two times a day (BID) | ORAL | Status: DC
  Administered 2019-04-20 – 2019-04-21 (×2): 20 mg via ORAL
  Filled 2019-04-20 (×2): qty 2

## 2019-04-20 NOTE — Progress Notes (Signed)
Occupational Therapy Session Note  Patient Details  Name: [name] MRN: [id number] Date of Birth: 21-Nov-1955  Today's Date: 04/20/2019 OT Individual Time: 1400-1445 OT Individual Time Calculation (min): 45 min    Short Term Goals: Week 1:  OT Short Term Goal 1 (Week 1): STGs=LTGs due to ELOS Week 2:     Skilled Therapeutic Interventions/Progress Updates:    1:1 NMR on right UE. Transported to the gym and transferred with min A stand step transfer over to the mat. Got into sidelying on left side with pillows for comfort and positioning. In sidelying worked on scapular mobility (protraction and retraction) and elbow flexion and extension with arm supported against gravity. Pt with trace to 2/5 activation in scapula and with elbow flexion. Less movement detected in elbow extension. After returning to sitting right UE position up on theraball and continued to focus on scapular stability and mobility. Also focus on lateral trunk flexion and extension with stabilization of right UE on ball with cues for trunk posture and positioning.  Left sitting up in her w/c with husband present.   Therapy Documentation Precautions:  Precautions Precautions: Fall Precaution Comments: Lt crani, pathologic fx L3 Restrictions Weight Bearing Restrictions: No General:   Vital Signs: Therapy Vitals Temp: 98.8 F (37.1 C) Pulse Rate: 74 Resp: 14 BP: 111/74 Patient Position (if appropriate): Sitting Oxygen Therapy SpO2: 96 % O2 Device: Room Air Pain: Mild discomfort but unrated  Therapy/Group: Individual Therapy  Willeen Cass [facility] 04/20/2019, 3:26 PM

## 2019-04-20 NOTE — Progress Notes (Signed)
Occupational Therapy Session Note  Patient Details  Name: [name] MRN: [id number] Date of Birth: 09-01-1955  Today's Date: 04/20/2019 OT Individual Time: 1030-1100 OT Individual Time Calculation (min): 30 min    Short Term Goals: Week 1:  OT Short Term Goal 1 (Week 1): STGs=LTGs due to ELOS      Skilled Therapeutic Interventions/Progress Updates:    Pt received in recliner and agreeable to therapy. Pt was eager to work on her R arm. She is able to actively flex and extend fingers and get grasp to hold object but minimal wrist extension with no active movement against gravity in proximal arm, increased tone noted with working effort.  Pt placed R hand on grasp handle of walking stick with other end of stick on floor.  Therapist supported arm and guided pt to focusing on activating triceps by pushing arm out, rotating in circles in both directions.  Then rotated stick so L hand holding bottom of stick for BUE sh flex and ext with trunk rotation.   Pt then worked on holding ball with support to maintain R hand on ball for PNF patterns of cross chops and trunk rotation. Pt discussed her new onset of cancer, stroke and how she is coping.  Pt does have good family support and is seeking out counseling.  Pt resting in recliner with all needs met.  Therapy Documentation Precautions:  Precautions Precautions: Fall Precaution Comments: Lt crani, pathologic fx L3 Restrictions Weight Bearing Restrictions: No   Pain: Pain Assessment Pain Scale: 0-10 Pain Score: 4  Pain Type: Chronic pain Pain Location: Back Pain Orientation: Left;Upper Pain Descriptors / Indicators: Aching;Discomfort Pain Frequency: Constant Pain Onset: On-going Patients Stated Pain Goal: 3 Pain Intervention(s): Medication (See eMAR) ADL: ADL Eating: Not assessed Grooming: Moderate assistance Where Assessed-Grooming: Sitting at sink Upper Body Bathing: Moderate assistance Where Assessed-Upper Body Bathing: Edge  of bed Lower Body Bathing: Minimal assistance Where Assessed-Lower Body Bathing: Edge of bed Upper Body Dressing: Moderate assistance(button up shirt) Where Assessed-Upper Body Dressing: Edge of bed Lower Body Dressing: Minimal assistance Where Assessed-Lower Body Dressing: Edge of bed Toileting: Not assessed Toilet Transfer: Moderate assistance Toilet Transfer Method: Stand pivot Toilet Transfer Equipment: Energy manager: Not assessed   Therapy/Group: Individual Therapy  [place] 04/20/2019, 11:42 AM

## 2019-04-20 NOTE — Progress Notes (Signed)
[facility] PHYSICAL MEDICINE & REHABILITATION PROGRESS NOTE  Subjective/Complaints: [name]'s pain is better controlled today, she says it is tolerable, but when asked if is still severe she says yes.  Pain is present in her lower thoracic/upper lumbar spine and has a throbbing quality.  Also has new right lower extremity swelling extending from the ankle to calf-- not painful.   ROS: Denies CP, SOB, N/V/D  Objective: Vital Signs: Blood pressure 126/77, pulse 73, temperature 97.6 F (36.4 C), resp. rate 18, height 5\' 7"  (1.702 m), weight 59.3 kg, SpO2 96 %. No results found. No results for input(s): WBC, HGB, HCT, PLT in the last 72 hours. No results for input(s): NA, K, CL, CO2, GLUCOSE, BUN, CREATININE, CALCIUM in the last 72 hours.  Physical Exam: BP 126/77 (BP Location: Left Arm)   Pulse 73   Temp 97.6 F (36.4 C)   Resp 18   Ht 5\' 7"  (1.702 m)   Wt 59.3 kg   SpO2 96%   BMI 20.48 kg/m  Constitutional: No distress . Vital signs reviewed. Frail.  HENT: Normocephalic.  Atraumatic. Eyes: EOMI. No discharge. Cardiovascular: No JVD. Respiratory: Normal effort.  No stridor. GI: Non-distended. Skin: Warm and dry.  Intact. Psych: Normal mood.  Normal behavior. Musc: Right lower extremity with tenderness. Neurological:  Alert Motor: Left upper extremity: 5/5 proximal distal Right upper extremity: Proximally 0/5, handgrip 4/5 with apraxia Left lower extremity: Hip flexion, knee extension 4 -/5, ankle dorsiflexion 4/5 Right lower extremity: Hip flexion, knee extension 2/5, ankle dorsiflexion 4/5 Lumbar back: Tenderness to palpation over upper lumbar and lower thoracic spine.   Assessment/Plan: 1. Functional deficits secondary to brain and spine metastatic disease which require 3+ hours per day of interdisciplinary therapy in a comprehensive inpatient rehab setting.  Physiatrist is providing close team supervision and 24 hour management of active medical problems listed  below.  Physiatrist and rehab team continue to assess barriers to discharge/monitor patient progress toward functional and medical goals  Care Tool:  Bathing    Body parts bathed by patient: Chest, Abdomen, Front perineal area, Right upper leg, Buttocks, Left upper leg, Right lower leg, Left lower leg, Face   Body parts bathed by helper: Right arm, Left arm     Bathing assist Assist Level: Minimal Assistance - Patient > 75%     Upper Body Dressing/Undressing Upper body dressing   What is the patient wearing?: Pull over shirt    Upper body assist Assist Level: Moderate Assistance - Patient 50 - 74%    Lower Body Dressing/Undressing Lower body dressing      What is the patient wearing?: (patient is not wearing any pants, stated for easy toileting)     Lower body assist Assist for lower body dressing: Minimal Assistance - Patient > 75%     Toileting Toileting Toileting Activity did not occur (Clothing management and hygiene only): N/A (no void or bm)  Toileting assist Assist for toileting: Minimal Assistance - Patient > 75%     Transfers Chair/bed transfer  Transfers assist     Chair/bed transfer assist level: Supervision/Verbal cueing     Locomotion Ambulation   Ambulation assist      Assist level: Supervision/Verbal cueing Assistive device: Walker-rolling Max distance: 70 ft   Walk 10 feet activity   Assist     Assist level: Supervision/Verbal cueing Assistive device: Walker-rolling, Orthosis   Walk 50 feet activity   Assist Walk 50 feet with 2 turns activity did not occur: Safety/medical  concerns(Decreased strength/activity tolerance)  Assist level: Supervision/Verbal cueing Assistive device: Walker-rolling, Orthosis    Walk 150 feet activity   Assist Walk 150 feet activity did not occur: Safety/medical concerns(Decreased strength/activity tolerance)         Walk 10 feet on uneven surface  activity   Assist Walk 10 feet on  uneven surfaces activity did not occur: Safety/medical concerns(Decreased strength/activity tolerance)         Wheelchair     Assist   Type of Wheelchair: Manual Wheelchair activity did not occur: Safety/medical concerns(R hemi, patient unable to propel without skilled intervention)  Wheelchair assist level: Supervision/Verbal cueing Max wheelchair distance: 50 ft    Wheelchair 50 feet with 2 turns activity    Assist    Wheelchair 50 feet with 2 turns activity did not occur: Safety/medical concerns(R hemi, patient unable to propel without skilled intervention)   Assist Level: Supervision/Verbal cueing   Wheelchair 150 feet activity     Assist Wheelchair 150 feet activity did not occur: Safety/medical concerns(R hemi, patient unable to propel without skilled intervention)          Medical Problem List and Plan: 1.  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   Continue CIR therapies  PRAFO to right lower extremity to prevent plantarflexion contracture 2.  Antithrombotics: -RLE DVT/anticoagulation:  Pharmaceutical: Therapeutic Lovenox,  Transition to NOAC after discharge             -antiplatelet therapy: N/A 3. Pain Management: Norco prn             Monitor with increased mobility  12/22: In significant pain this morning, present in her back. Added Norco10mg  for severe pain for better pain control and to allow her to participate in therapy.   12/23: [name]'s pain is better controlled but she is asking for pain medication every 3.5 hours. Pain is present in her lower thoracic/upper lumbar spine and has a throbbing quality. The pain in her legs is much improved. She appears uncomfortable. She has been performing better in therapy after increasing her pain medication. Will increase frequency to [facility].  12/24: [name]'s pain is better controlled today after palliative care scheduled her tylenol and  gabapentin yesterday. Spoke with palliative care today, and they also recommended adding long-acting pain control as well. I have added Oxycontin 10mg  q12H.   12/25: Since she is still requesting IR oxy prior to next dose, I will increased Oxycontin to 15mg  BID  12/26: Pain now tolerable, but when asked if still severe at times, she says yes. Will increase Oxycontin to 20mg  BID. Discussed the possibility of a Fentanyl patch for better pain control. Currently tolerating increased Oxy doses well.  4. Mood: LCSW to follow for evaluation              -antipsychotic agents: N/A 5. Neuropsych: This patient is capable of making decisions on her own behalf. 6. Skin/Wound Care: Routine pressure relief measures 7. Fluids/Electrolytes/Nutrition: Ensure Enlive po BID for increased nutritional needs due to cancer/hypoalbuminemia, appreciate dietary input.  BMP within acceptable range on 12/20, 12/21 8. Stage 4 adenocarcinoma of the lung with metastases to the brain and spine:             -Post-op brain radiation to start on Jan 4th.             -Specimen sent for genetic analysis, results should take 7-10 days  Palliative care consulted; input appreciated.  9. Spasticity:   Baclofen 5mg  TID for increased tone in right lower extremity, monitor for sedation.  10.  Leukocytosis-likely related to CA  WBCs 15.6 on 12/20, stable 12/21  Afebrile  Continue to monitor 11. Constipation: Will DC Senna-Docusate since now having regular BM 12. Right lower extremity swelling: New on 12/26. Extends from ankle to calf. Will obtain RLE [place]. Advised that she ice, elevate, and use compression stocking to minimize swelling. RLE no longer painful.    LOS: 7 days A FACE TO FACE EVALUATION WAS PERFORMED  [name] 04/20/2019, 10:55 AM

## 2019-04-20 NOTE — Progress Notes (Signed)
Physical Therapy Session Note  Patient Details  Name: Omega Durante MRN: [id number] Date of Birth: May 02, 1955  Today's Date: 04/20/2019 PT Individual Time: 0900-1005 PT Individual Time Calculation (min): 65 min   Short Term Goals: Week 1:  PT Short Term Goal 1 (Week 1): STG=LTG due to short ELOS.      Skilled Therapeutic Interventions/Progress Updates:  Pt seated in recliner.  She rated pain 5/10 L mid back, premedicated.  She reported that she needed to use toilet.  Sit> stand with min asisst to RW.  Gait with CGA to/from BR.  Toilet transfer with CGA.  Pt managed clothing and peri care with supervision.  Continent of bladder.  Transfer training for forward wt shift, increased loading of RLE, without use of UEs, mod assist initially, improving to min assist with practice throughout session.  neuromuscular re-education via forced use, mulitmodal cues for isolated activation RLE, using Kinetron from w/c, at resistance 50/cm/sec, with PT assisting with set-up q push with RLE, x 20 cycles targeting quadricps muscles, x 30 cycles x 2  targeting gluteal muscles.  In supine, bil bridging with adductor squeezes; in hooklying bil hip abduction/adduction;  in partial L side lying, R hip abduction with flexed knees and hips.  Pt asked for a hospital ex program that she can do with husband tomorrow, when she does not have tx.  Advanced gait , backwards, x 5' x 3 throughout session, iwht RW with R hand splint, CGA.  Cues for longer step length RLE.    Bed mobility training to avoid L mid back pain; pt benefitted from moving into long sitting before sliding LEs on mat to drop feet off of EOM, then scoot hips around.       Therapy Documentation Precautions:  Precautions Precautions: Fall Precaution Comments: Lt crani, pathologic fx L3 Restrictions Weight Bearing Restrictions: No   Pain: Pain Assessment Pain Scale: 0-10 Pain Score: 5  Pain Type: Chronic pain Pain Location: Back Pain  Orientation: Left;Upper Pain Descriptors / Indicators: Aching;Discomfort Pain Frequency: Constant Pain Onset: On-going Patients Stated Pain Goal: 3 Pain Intervention(s): Medication (See eMAR)      Therapy/Group: Individual Therapy  Chrisy Hillebrand 04/20/2019, 10:51 AM

## 2019-04-20 NOTE — Plan of Care (Signed)
  Problem: Consults Goal: RH BRAIN INJURY PATIENT EDUCATION Description: Description: See Patient Education module for eduction specifics mod assist Outcome: Progressing   Problem: RH BOWEL ELIMINATION Goal: RH STG MANAGE BOWEL W/MEDICATION W/ASSISTANCE Description: STG Manage Bowel with Medication with mod I Assistance. Outcome: Progressing   Problem: RH BLADDER ELIMINATION Goal: RH STG MANAGE BLADDER WITH ASSISTANCE Description: STG Manage Bladder With mod I Assistance Outcome: Progressing   Problem: RH SAFETY Goal: RH STG ADHERE TO SAFETY PRECAUTIONS W/ASSISTANCE/DEVICE Description: STG Adhere to Safety Precautions With Assistance/Device. mod I assistance Outcome: Progressing   Problem: RH PAIN MANAGEMENT Goal: RH STG PAIN MANAGED AT OR BELOW PT'S PAIN GOAL Description: Pain less than 4 Outcome: Progressing   Problem: RH KNOWLEDGE DEFICIT BRAIN INJURY Goal: RH STG INCREASE KNOWLEDGE OF SELF CARE AFTER BRAIN INJURY Description: Pt will be able to demonstrates understanding of proper precautions to take to prevent falls , such as removing environmental hazards with mod I assist upon discharge.  Outcome: Progressing   Problem: Consults Goal: Skin Care Protocol Initiated - if Braden Score 18 or less Description: If consults are not indicated, leave blank or document N/A Outcome: Completed/Met Goal: Nutrition Consult-if indicated Outcome: Completed/Met   Problem: RH SKIN INTEGRITY Goal: RH STG MAINTAIN SKIN INTEGRITY WITH ASSISTANCE Description: STG Maintain Skin Integrity With mod I Assistance. Outcome: Completed/Met   Problem: RH SAFETY Goal: RH STG DECREASED RISK OF FALL WITH ASSISTANCE Description: STG Decreased Risk of Fall With cues and reminders Assistance. Outcome: Completed/Met   Problem: RH COGNITION-NURSING Goal: RH STG ANTICIPATES NEEDS/CALLS FOR ASSIST W/ASSIST/CUES Description: STG Anticipates Needs/Calls for Assist With cues and reminders Outcome:  Completed/Met

## 2019-04-21 ENCOUNTER — Inpatient Hospital Stay (HOSPITAL_COMMUNITY): Payer: BC Managed Care – PPO

## 2019-04-21 DIAGNOSIS — R609 Edema, unspecified: Secondary | ICD-10-CM

## 2019-04-21 MED ORDER — OXYCODONE HCL 5 MG PO TABS
10.0000 mg | ORAL_TABLET | ORAL | Status: DC | PRN
  Administered 2019-04-21: 10 mg via ORAL
  Filled 2019-04-21: qty 2

## 2019-04-21 MED ORDER — FENTANYL 12 MCG/HR TD PT72
1.0000 | MEDICATED_PATCH | TRANSDERMAL | Status: DC
  Administered 2019-04-21: 1 via TRANSDERMAL
  Filled 2019-04-21: qty 1

## 2019-04-21 MED ORDER — ENOXAPARIN SODIUM 60 MG/0.6ML ~~LOC~~ SOLN
1.0000 mg/kg | Freq: Two times a day (BID) | SUBCUTANEOUS | Status: DC
  Administered 2019-04-21 – 2019-04-22 (×2): 60 mg via SUBCUTANEOUS
  Filled 2019-04-21 (×2): qty 0.6

## 2019-04-21 MED ORDER — OXYCODONE HCL 5 MG PO TABS
10.0000 mg | ORAL_TABLET | ORAL | Status: DC | PRN
  Administered 2019-04-21 – 2019-04-23 (×14): 10 mg via ORAL
  Filled 2019-04-21 (×14): qty 2

## 2019-04-21 MED ORDER — SENNOSIDES-DOCUSATE SODIUM 8.6-50 MG PO TABS
1.0000 | ORAL_TABLET | Freq: Two times a day (BID) | ORAL | Status: DC
  Administered 2019-04-21 – 2019-04-23 (×4): 1 via ORAL
  Filled 2019-04-21 (×4): qty 1

## 2019-04-21 MED ORDER — METHOCARBAMOL 500 MG PO TABS
500.0000 mg | ORAL_TABLET | Freq: Four times a day (QID) | ORAL | Status: DC | PRN
  Administered 2019-04-21 – 2019-04-23 (×4): 500 mg via ORAL
  Filled 2019-04-21 (×4): qty 1

## 2019-04-21 NOTE — Care Management (Signed)
[facility] Individual Statement of Services  Patient Name:  [name]  Date:  04/18/2019  Welcome to the [facility].  Our goal is to provide you with an individualized program based on your diagnosis and situation, designed to meet your specific needs.  With this comprehensive rehabilitation program, you will be expected to participate in at least 3 hours of rehabilitation therapies Monday-Friday, with modified therapy programming on the weekends.  Your rehabilitation program will include the following services:  Physical Therapy (PT), Occupational Therapy (OT), Speech Therapy (ST), 24 hour per day rehabilitation nursing, Therapeutic Recreaction (TR), Neuropsychology, Case Management (Social Worker), Rehabilitation Medicine, Nutrition Services and Pharmacy Services  Weekly team conferences will be held on Tuesdays to discuss your progress.  Your Social Worker will talk with you frequently to get your input and to update you on team discussions.  Team conferences with you and your family in attendance may also be held.  Expected length of stay: 7-10 days   Overall anticipated outcome: supervision  Depending on your progress and recovery, your program may change. Your Social Worker will coordinate services and will keep you informed of any changes. Your Social Worker's name and contact numbers are listed  below.  The following services may also be recommended but are not provided by the [facility] will be made to provide these services after discharge if needed.  Arrangements include referral to agencies that provide these services.  Your insurance has been verified to be:  [place] Your primary doctor is:  [employer]er, [employer]  Pertinent information will be shared with your doctor and your insurance company.  Social Worker:   [place], [place] or ([phone]   Information discussed with and copy given to patient by: Lennart Pall, 04/18/2019, 12:28 PM

## 2019-04-21 NOTE — Progress Notes (Signed)
Right lower extremity venous duplex completed. Refer to "CV Proc" under chart review to view preliminary results.  Preliminary results discussed with Dr. Ranell Patrick.  04/21/2019 11:17 AM Kelby Aline., MHA, RVT, RDCS, RDMS

## 2019-04-21 NOTE — Progress Notes (Signed)
Pt slept on and off, requesting pain meds q 3-4 hours. Still no bm, refused suppository, given prune juice and miralax this am

## 2019-04-21 NOTE — Progress Notes (Addendum)
Addendum #3Martin [name] to speak patient regarding oncology and pharmacy updates. She was in much more severe pain than usual. Had just started and nurse had administered IR oxycodone. Fentanyl patch applied and patient has not yet felt relief from it. Pain is most prominent in left lower ribs around level T7--not on spinal vertebrae. Has tenderness to palpation in this area. No tenderness over spinal column on this exam. This acute worsening of pain could be due to the fact that she did not take Oxy IR as frequently today (appeared comfortable despite reporting pain when I last saw her) and that she was just moved around for RLE [place]. Will start Robaxin 500mg  q6H prn for muscle spasms and increase Oxycodone frequency back to [facility]. If Fentanyl well tolerated, can consider dose increase to 57mcg tomorrow.   Addendum #2: Spoke with Dr. Burney Gauze, patient's oncologist, about acute DVT changes on today's [place] despite therapeutic Lovenox. Recommended that patient be started on Heparin and discussed with pharmacy. Discussed when port for chemo may be placed and possibly tomorrow. Spoke with Corene Cornea from pharmacy who recommended trying 60mg  BID Lovenox (was currently on 90 QD) before heparin. Will make this change and update patient.   Addendum #1:   Right lower extremity vascular US with the following findings: Findings consistent with acute deep vein thrombosis involving the right femoral vein, right proximal profunda vein, right popliteal vein, right posterior tibial veins, and right peroneal veins. No cystic structure found in the popliteal fossa. Left: No evidence of common femoral vein obstruction.  When compared to prior study, right lower extremity veins with thrombus are more dilated during this exam with less echogenic components. This is suggestive of acute changes.  Placed hematology consult order in Epic and spoke with answering service of on-call physician, who will connect [place], to discuss most appropriate  change in anticoagulation.  [name] PHYSICAL MEDICINE & REHABILITATION PROGRESS NOTE  Subjective/Complaints: [name]'s pain is better controlled than initially, but still severe and she is taking the IR oxycodone around the clock: Received at 5am, 1a, 10pm most recently.  Discussed transitioning to Fentanyl patch for better pain control and patient is agreeable.  Leg swelling has decreased.  ROS: Denies CP, SOB, N/V/D  Objective: Vital Signs: Blood pressure 133/90, pulse 66, temperature 97.9 F (36.6 C), resp. rate 15, height 5\' 7"  (1.702 m), weight 59.3 kg, SpO2 97 %. No results found. No results for input(s): WBC, HGB, HCT, PLT in the last 72 hours. No results for input(s): NA, K, CL, CO2, GLUCOSE, BUN, CREATININE, CALCIUM in the last 72 hours.  Physical Exam: BP 133/90 (BP Location: Left Arm)   Pulse 66   Temp 97.9 F (36.6 C)   Resp 15   Ht 5\' 7"  (1.702 m)   Wt 59.3 kg   SpO2 97%   BMI 20.48 kg/m  Constitutional: No distress . Vital signs reviewed. Frail.  HENT: Normocephalic.  Atraumatic. Eyes: EOMI. No discharge. Cardiovascular: No JVD. Respiratory: Normal effort.  No stridor. GI: Non-distended. Skin: Warm and dry.  Intact. Psych: Normal mood.  Normal behavior. Musc: Right lower extremity with tenderness. Neurological:  Alert Motor: Left upper extremity: 5/5 proximal distal Right upper extremity: Proximally 0/5, handgrip 4/5 with apraxia Left lower extremity: Hip flexion, knee extension 4 -/5, ankle dorsiflexion 4/5 Right lower extremity: Hip flexion, knee extension 3/5, ankle dorsiflexion 4/5 Lumbar back: Tenderness to palpation over upper lumbar and lower thoracic spine.   Assessment/Plan: 1. Functional deficits secondary to brain and  spine metastatic disease which require 3+ hours per day of interdisciplinary therapy in a comprehensive inpatient rehab setting.  Physiatrist is providing close team supervision and 24 hour management of active medical  problems listed below.  Physiatrist and rehab team continue to assess barriers to discharge/monitor patient progress toward functional and medical goals  Care Tool:  Bathing    Body parts bathed by patient: Chest, Abdomen, Front perineal area, Right upper leg, Buttocks, Left upper leg, Right lower leg, Left lower leg, Face   Body parts bathed by helper: Right arm, Left arm     Bathing assist Assist Level: Minimal Assistance - Patient > 75%     Upper Body Dressing/Undressing Upper body dressing   What is the patient wearing?: Pull over shirt    Upper body assist Assist Level: Moderate Assistance - Patient 50 - 74%    Lower Body Dressing/Undressing Lower body dressing      What is the patient wearing?: (patient is not wearing any pants, stated for easy toileting)     Lower body assist Assist for lower body dressing: Minimal Assistance - Patient > 75%     Toileting Toileting Toileting Activity did not occur (Clothing management and hygiene only): N/A (no void or bm)  Toileting assist Assist for toileting: Minimal Assistance - Patient > 75%     Transfers Chair/bed transfer  Transfers assist     Chair/bed transfer assist level: Supervision/Verbal cueing     Locomotion Ambulation   Ambulation assist      Assist level: Supervision/Verbal cueing Assistive device: Walker-rolling Max distance: 70 ft   Walk 10 feet activity   Assist     Assist level: Supervision/Verbal cueing Assistive device: Walker-rolling, Orthosis   Walk 50 feet activity   Assist Walk 50 feet with 2 turns activity did not occur: Safety/medical concerns(Decreased strength/activity tolerance)  Assist level: Supervision/Verbal cueing Assistive device: Walker-rolling, Orthosis    Walk 150 feet activity   Assist Walk 150 feet activity did not occur: Safety/medical concerns(Decreased strength/activity tolerance)         Walk 10 feet on uneven surface  activity   Assist Walk  10 feet on uneven surfaces activity did not occur: Safety/medical concerns(Decreased strength/activity tolerance)         Wheelchair     Assist   Type of Wheelchair: Manual Wheelchair activity did not occur: Safety/medical concerns(R hemi, patient unable to propel without skilled intervention)  Wheelchair assist level: Supervision/Verbal cueing Max wheelchair distance: 50 ft    Wheelchair 50 feet with 2 turns activity    Assist    Wheelchair 50 feet with 2 turns activity did not occur: Safety/medical concerns(R hemi, patient unable to propel without skilled intervention)   Assist Level: Supervision/Verbal cueing   Wheelchair 150 feet activity     Assist Wheelchair 150 feet activity did not occur: Safety/medical concerns(R hemi, patient unable to propel without skilled intervention)          Medical Problem List and Plan: 1.  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   Continue CIR therapies  PRAFO to right lower extremity to prevent plantarflexion contracture 2.  Antithrombotics: -RLE DVT/anticoagulation:  Pharmaceutical: Therapeutic Lovenox,  Transition to NOAC after discharge             -antiplatelet therapy: N/A 3. Pain Management: Norco prn             Monitor with increased mobility  12/22: In significant pain  this morning, present in her back. Added Norco10mg  for severe pain for better pain control and to allow her to participate in therapy.   12/23: [name]'s pain is better controlled but she is asking for pain medication every 3.5 hours. Pain is present in her lower thoracic/upper lumbar spine and has a throbbing quality. The pain in her legs is much improved. She appears uncomfortable. She has been performing better in therapy after increasing her pain medication. Will increase frequency to [facility].  12/24: [name]'s pain is better controlled today after palliative care scheduled her  tylenol and gabapentin yesterday. Spoke with palliative care today, and they also recommended adding long-acting pain control as well. I have added Oxycontin 10mg  q12H.   12/25: Since she is still requesting IR oxy prior to next dose, I will increased Oxycontin to 15mg  BID  12/26: Pain now tolerable, but when asked if still severe at times, she says yes. Will increase Oxycontin to 20mg  BID. Discussed the possibility of a Fentanyl patch for better pain control. Currently tolerating increased Oxy doses well.   12/27: Continuing to ask for breakthrough oxycodone every 3-4 hours. Pain is still always present, not worse at particular time of day, 6 or 7 in intensity. Discussed transitioning to Fentanyl patch for better pain control and patient is agreeable. Spoke with Corene Cornea from pharmacy regarding transition. Was previously on approximately 180 morphine equivalents with Oxycontin/Oxycodone q3H and will be transitioned to 120 morphine equivalents with Fentanyl patch 48mcg/Oxycodone q4H. If well tolerated, can uptitrate to 60mcg prior to discharge on Tuesday. Monitor for respiratory distress.  4. Mood: LCSW to follow for evaluation              -antipsychotic agents: N/A 5. Neuropsych: This patient is capable of making decisions on her own behalf. 6. Skin/Wound Care: Routine pressure relief measures 7. Fluids/Electrolytes/Nutrition: Ensure Enlive po BID for increased nutritional needs due to cancer/hypoalbuminemia, appreciate dietary input.  BMP within acceptable range on 12/20, 12/21 8. Stage 4 adenocarcinoma of the lung with metastases to the brain and spine:             -Post-op brain radiation to start on Jan 4th.             -Specimen sent for genetic analysis, results should take 7-10 days             Palliative care consulted; input appreciated.  9. Spasticity:   Baclofen 5mg  TID for increased tone in right lower extremity, monitor for sedation.  10.  Leukocytosis-likely related to CA  WBCs 15.6 on  12/20, stable 12/21  Afebrile  Continue to monitor 11. Constipation: Will DC Senna-Docusate since now having regular BM  12/27: Given increase in opioids and likely opioid induced constipation, will resume senna-docusate BID.  12. Right lower extremity swelling: New on 12/26. Extends from ankle to calf. Will obtain RLE [place]. Advised that she ice, elevate, and use compression stocking to minimize swelling. RLE no longer painful.    LOS: 8 days A FACE TO FACE EVALUATION WAS PERFORMED  Martha Clan P Sophira Rumler 04/21/2019, 10:25 AM

## 2019-04-21 NOTE — Progress Notes (Signed)
Social Work Assessment and Plan   Patient Details  Name: [name] MRN: [id number] Date of Birth: 02/15/1956  Today's Date: 04/16/2019  Problem List:  Patient Active Problem List   Diagnosis Date Noted  . Palliative care by specialist   . DNR (do not resuscitate) discussion   . Cancer associated pain   . Leucocytosis   . Hypoalbuminemia due to protein-calorie malnutrition ([place])   . Lung cancer metastatic to brain ([place]) 04/13/2019  . Adenocarcinoma, lung ([place])   . Postoperative pain   . Spasticity   . Acute deep vein thrombosis (DVT) of calf muscle vein of right lower extremity ([place])   . Adenocarcinoma of lung, stage 4, right ([place]) 04/11/2019  . Metastatic cancer to brain ([place]) 04/05/2019  . Cancer, metastatic to bone ([place]) 04/03/2019  . Brain mass 04/01/2019  . Intermittent explosive disorder 06/13/2016  . Chest pain 01/11/2013  . HYPERCHOLESTEROLEMIA WITH HIGH HDL 04/08/2009  . Essential hypertension 04/08/2009  . TOBACCO ABUSE 11/20/2008   Past Medical History:  Past Medical History:  Diagnosis Date  . Bronchitis   . Hypertension   . Osteoarthritis of metacarpophalangeal (MCP) joint of left thumb   . Pelvic ring fracture ([place]) 09/2016   left   Past Surgical History:  Past Surgical History:  Procedure Laterality Date  . APPLICATION OF CRANIAL NAVIGATION Left 04/05/2019   Procedure: APPLICATION OF CRANIAL NAVIGATION;  Surgeon: Earnie Larsson, MD;  Location: [place];  Service: Neurosurgery;  Laterality: Left;  . CRANIOTOMY Left 04/05/2019   Procedure: LEFT FRONTAL CRANIOTOMY TUMOR EXCISION w/Brain Lab;  Surgeon: Earnie Larsson, MD;  Location: [place];  Service: Neurosurgery;  Laterality: Left;  LEFT FRONTAL CRANIOTOMY TUMOR EXCISION w/Brain Lab   Social History:  reports that she has been smoking cigarettes. She has a 12.50 pack-year smoking history. She has never used smokeless tobacco. She reports that she does not drink alcohol or use drugs.  Family / Support  Systems Marital Status: Married Patient Roles: Spouse, Other (Comment) Spouse/Significant Other: spouse, Cannie Muckle @ [phone] Other Supports: Pt notes that spouse' family is very involved and supportive. Anticipated Caregiver: husband and sister in law (both at home) Ability/Limitations of Caregiver: Min A Caregiver Availability: 24/7 Family Dynamics: Pt notes that her parents are currently living with them, however, her siblings are to assist with moving them to another home "given this situation."  Social History Preferred language: English Religion:  Cultural Background: N/A Education: Secretary/administrator Read: Yes Write: Yes Employment Status: Employed Public relations account executive Issues: None Guardian/Conservator: None-per MD, patient is capable of making decisions on her own behalf.   Abuse/Neglect Abuse/Neglect Assessment Can Be Completed: Yes Physical Abuse: Denies Verbal Abuse: Denies Sexual Abuse: Denies Exploitation of patient/patient's resources: Denies Self-Neglect: Denies  Emotional Status Pt's affect, behavior and adjustment status: Patient sitting up in wheelchair and able to complete assessment interview without any difficulty.  Husband also present and very involved in discussion and supportive to patient.  They both explained that they are "going to stay positive" about this new cancer diagnosis patient living easily with her spouse, however, does admit here moving forward with treatments.  May benefit from neuropsychology consult while here to further assess mood and coding status. Recent Psychosocial Issues: Patient and spouse providing care and support to patient's elderly parents in the home. Psychiatric History: None Substance Abuse History: None  Patient / Family Perceptions, Expectations & Goals Pt/Family understanding of illness & functional limitations: Patient and spouse report they have received very clear information from  Dr. Marin Olp and the oncology  service.  Good understanding of her diagnoses and expected treatment plan moving forward. Premorbid pt/family roles/activities: Patient was completely independent. Anticipated changes in roles/activities/participation: Spouse to provide primary caregiver support. Pt/family expectations/goals: Patient hopeful that she can reach tx supervision goals and eventually regain her prior level of independence.  Community Resources [employer] Scripts: None Premorbid Home Care/DME Agencies: None Transportation available at discharge: Yes Resource referrals recommended: Neuropsychology  Discharge Planning Living Arrangements: Spouse/significant other Support Systems: Spouse/significant other, Other relatives, Water engineer, Social worker community Type of Residence: Private residence Insurance Resources: Multimedia programmer (specify)(BCBS) Financial Resources: Employment Museum/gallery curator Screen Referred: No Money Management: Spouse Does the patient have any problems obtaining your medications?: No Home Management: pt and spouse Patient/Family Preliminary Plans: Pt to d/c home with spouse providing any needed assistance. Social Work Anticipated Follow Up Needs: HH/OP Expected length of stay: 7-10 days  Clinical Impression Very pleasant woman here following newly diagnosed cancer with brain mets and s/p craniotomy.  Spouse at bedside and very supportive.  Assures SW that he and his family will provide 24/7 support.  Both pt and spouse appear very realistic about her diagnosis and anticipated treatment plans following CIR.  May benefit from neuropsychology consult while here for additional emotional support.  Doralee Kocak 04/16/2019, 12:23 PM

## 2019-04-22 ENCOUNTER — Inpatient Hospital Stay (HOSPITAL_COMMUNITY): Payer: BC Managed Care – PPO | Admitting: Occupational Therapy

## 2019-04-22 ENCOUNTER — Inpatient Hospital Stay (HOSPITAL_COMMUNITY): Payer: BC Managed Care – PPO | Admitting: Physical Therapy

## 2019-04-22 LAB — COMPREHENSIVE METABOLIC PANEL
ALT: 40 U/L (ref 0–44)
AST: 18 U/L (ref 15–41)
Albumin: 2.4 g/dL — ABNORMAL LOW (ref 3.5–5.0)
Alkaline Phosphatase: 182 U/L — ABNORMAL HIGH (ref 38–126)
Anion gap: 9 (ref 5–15)
BUN: 7 mg/dL — ABNORMAL LOW (ref 8–23)
CO2: 26 mmol/L (ref 22–32)
Calcium: 8.6 mg/dL — ABNORMAL LOW (ref 8.9–10.3)
Chloride: 102 mmol/L (ref 98–111)
Creatinine, Ser: 0.5 mg/dL (ref 0.44–1.00)
GFR calc Af Amer: >60 mL/min (ref >60)
GFR calc non Af Amer: >60 mL/min (ref >60)
Glucose, Bld: 115 mg/dL — ABNORMAL HIGH (ref 70–99)
Potassium: 4 mmol/L (ref 3.5–5.1)
Sodium: 137 mmol/L (ref 135–145)
Total Bilirubin: 0.3 mg/dL (ref 0.3–1.2)
Total Protein: 6.3 g/dL — ABNORMAL LOW (ref 6.5–8.1)

## 2019-04-22 LAB — CBC WITH DIFFERENTIAL/PLATELET
Abs Immature Granulocytes: 0.04 10*3/uL (ref 0.00–0.07)
Basophils Absolute: 0 10*3/uL (ref 0.0–0.1)
Basophils Relative: 0 %
Eosinophils Absolute: 0.1 10*3/uL (ref 0.0–0.5)
Eosinophils Relative: 1 %
HCT: 33.9 % — ABNORMAL LOW (ref 36.0–46.0)
Hemoglobin: 11.1 g/dL — ABNORMAL LOW (ref 12.0–15.0)
Immature Granulocytes: 0 %
Lymphocytes Relative: 14 %
Lymphs Abs: 1.4 10*3/uL (ref 0.7–4.0)
MCH: 30.7 pg (ref 26.0–34.0)
MCHC: 32.7 g/dL (ref 30.0–36.0)
MCV: 93.6 fL (ref 80.0–100.0)
Monocytes Absolute: 0.7 10*3/uL (ref 0.1–1.0)
Monocytes Relative: 6 %
Neutro Abs: 7.9 10*3/uL — ABNORMAL HIGH (ref 1.7–7.7)
Neutrophils Relative %: 79 %
Platelets: 393 10*3/uL (ref 150–400)
RBC: 3.62 MIL/uL — ABNORMAL LOW (ref 3.87–5.11)
RDW: 13.4 % (ref 11.5–15.5)
WBC: 10.2 10*3/uL (ref 4.0–10.5)
nRBC: 0 % (ref 0.0–0.2)

## 2019-04-22 LAB — BASIC METABOLIC PANEL
Anion gap: 10 (ref 5–15)
BUN: 7 mg/dL — ABNORMAL LOW (ref 8–23)
CO2: 27 mmol/L (ref 22–32)
Calcium: 8.6 mg/dL — ABNORMAL LOW (ref 8.9–10.3)
Chloride: 100 mmol/L (ref 98–111)
Creatinine, Ser: 0.51 mg/dL (ref 0.44–1.00)
GFR calc Af Amer: >60 mL/min (ref >60)
GFR calc non Af Amer: >60 mL/min (ref >60)
Glucose, Bld: 78 mg/dL (ref 70–99)
Potassium: 4 mmol/L (ref 3.5–5.1)
Sodium: 137 mmol/L (ref 135–145)

## 2019-04-22 MED ORDER — APIXABAN 5 MG PO TABS: 10.0000 mg | ORAL_TABLET | Freq: Two times a day (BID) | ORAL | 0 refills | Status: DC

## 2019-04-22 MED ORDER — ACETAMINOPHEN 325 MG PO TABS: 650.0000 mg | ORAL_TABLET | Freq: Four times a day (QID) | ORAL | Status: AC

## 2019-04-22 MED ORDER — APIXABAN 5 MG PO TABS: 5.0000 mg | ORAL_TABLET | Freq: Two times a day (BID) | ORAL | Status: DC

## 2019-04-22 MED ORDER — SENNOSIDES-DOCUSATE SODIUM 8.6-50 MG PO TABS: 1.0000 | ORAL_TABLET | Freq: Two times a day (BID) | ORAL | Status: DC

## 2019-04-22 MED ORDER — SENNOSIDES-DOCUSATE SODIUM 8.6-50 MG PO TABS: 1.0000 | ORAL_TABLET | Freq: Two times a day (BID) | ORAL | 0 refills | Status: DC

## 2019-04-22 MED ORDER — LEVETIRACETAM 500 MG PO TABS: 500.0000 mg | ORAL_TABLET | Freq: Two times a day (BID) | ORAL | 1 refills | Status: DC

## 2019-04-22 MED ORDER — APIXABAN 5 MG PO TABS: 5.0000 mg | ORAL_TABLET | Freq: Two times a day (BID) | ORAL | 1 refills | Status: DC

## 2019-04-22 MED ORDER — GABAPENTIN 300 MG PO CAPS: 300.0000 mg | ORAL_CAPSULE | Freq: Three times a day (TID) | ORAL | 1 refills | Status: DC

## 2019-04-22 MED ORDER — FENTANYL 25 MCG/HR TD PT72: 1.0000 | MEDICATED_PATCH | TRANSDERMAL | 0 refills | Status: DC

## 2019-04-22 MED ORDER — DRONABINOL 5 MG PO CAPS: 5.0000 mg | ORAL_CAPSULE | Freq: Two times a day (BID) | ORAL | 0 refills | Status: DC

## 2019-04-22 MED ORDER — OXYCODONE HCL 10 MG PO TABS: 10.0000 mg | ORAL_TABLET | ORAL | 0 refills | Status: DC | PRN

## 2019-04-22 MED ORDER — PROMETHAZINE HCL 12.5 MG PO TABS: 12.5000 mg | ORAL_TABLET | ORAL | 0 refills | Status: DC | PRN

## 2019-04-22 MED ORDER — NALOXONE HCL 0.4 MG/ML IJ SOLN: 0.2000 mg | INTRAMUSCULAR | 0 refills | Status: DC | PRN

## 2019-04-22 MED ORDER — APIXABAN 5 MG PO TABS
10.0000 mg | ORAL_TABLET | Freq: Two times a day (BID) | ORAL | Status: DC
  Administered 2019-04-22: 10 mg via ORAL
  Filled 2019-04-22: qty 2

## 2019-04-22 MED ORDER — PANTOPRAZOLE SODIUM 40 MG PO TBEC: 40.0000 mg | DELAYED_RELEASE_TABLET | Freq: Every day | ORAL | 1 refills | Status: DC

## 2019-04-22 MED ORDER — TRAZODONE HCL 50 MG PO TABS: 25.0000 mg | ORAL_TABLET | Freq: Every evening | ORAL | 0 refills | Status: DC | PRN

## 2019-04-22 MED ORDER — APIXABAN 5 MG PO TABS
10.0000 mg | ORAL_TABLET | Freq: Two times a day (BID) | ORAL | Status: DC
  Administered 2019-04-22 – 2019-04-23 (×2): 10 mg via ORAL
  Filled 2019-04-22 (×2): qty 2

## 2019-04-22 MED ORDER — METHOCARBAMOL 500 MG PO TABS: 500.0000 mg | ORAL_TABLET | Freq: Four times a day (QID) | ORAL | 0 refills | Status: DC | PRN

## 2019-04-22 MED ORDER — BACLOFEN 5 MG PO TABS: 5.0000 mg | ORAL_TABLET | Freq: Three times a day (TID) | ORAL | 0 refills | Status: DC

## 2019-04-22 NOTE — Progress Notes (Signed)
[facility] PH[facility] MEDICINE [facility] REHABILITATION PROGRESS NOTE  Subjective/Complaints: Tells me that pain is controlled today. Right leg still swollen.   ROS: Patient denies fever, rash, sore throat, blurred vision, nausea, vomiting, diarrhea, cough, shortness of breath or chest pain, joint or back pain, headache, or mood change.     Objective: Vital Signs: Blood pressure (!) 104/52, pulse 64, temperature 98.6 F (37 C), resp. rate 16, height 5\' 7"  (1.702 m), weight 59.3 kg, SpO2 93 %. VAS [place] LOWER EXTREMITY VENOUS (DVT)  Result Date: 04/21/2019  Lower Venous Study Indications: Swelling.  Comparison Study: 04/08/2019- age indeterminate DVT right femoral, profunda                   femoral, popliteal, posterior tibial, peroneal veins. Performing Technologist: Maudry Mayhew MHA, RDMS, RVT, RDCS  Examination Guidelines: A complete evaluation includes B-mode imaging, spectral Doppler, color Doppler, and power Doppler as needed of all accessible portions of each vessel. Bilateral testing is considered an integral part of a complete examination. Limited examinations for reoccurring indications may be performed as noted.  +---------+---------------+---------+-----------+----------+--------------+ RIGHT    CompressibilityPhasicitySpontaneityPropertiesThrombus Aging +---------+---------------+---------+-----------+----------+--------------+ CFV      Full           Yes      Yes                                 +---------+---------------+---------+-----------+----------+--------------+ SFJ      Full                                                        +---------+---------------+---------+-----------+----------+--------------+ FV Prox  None                                         Acute          +---------+---------------+---------+-----------+----------+--------------+ FV Mid   None                    No                   Acute           +---------+---------------+---------+-----------+----------+--------------+ FV DistalNone                                         Acute          +---------+---------------+---------+-----------+----------+--------------+ PFV      None                                         Acute          +---------+---------------+---------+-----------+----------+--------------+ POP      None           Yes      Yes                  Acute          +---------+---------------+---------+-----------+----------+--------------+ PTV      None  Acute          +---------+---------------+---------+-----------+----------+--------------+ PERO     None                                         Acute          +---------+---------------+---------+-----------+----------+--------------+  +----+---------------+---------+-----------+----------+--------------+ LEFTCompressibilityPhasicitySpontaneityPropertiesThrombus Aging +----+---------------+---------+-----------+----------+--------------+ CFV Full           Yes      Yes                                 +----+---------------+---------+-----------+----------+--------------+  Summary: Right: Findings consistent with acute deep vein thrombosis involving the right femoral vein, right proximal profunda vein, right popliteal vein, right posterior tibial veins, and right peroneal veins. No cystic structure found in the popliteal fossa. Left: No evidence of common femoral vein obstruction. When compared to prior study, right lower extremity veins with thrombus are more dilated during this exam with less echogenic components. This is suggestive of acute changes.  *See table(s) above for measurements and observations. Electronically signed by Monica Martinez MD on 04/21/2019 at 1:49:52 PM.    Final    Recent Labs    04/22/19 1048  WBC 10.2  HGB 11.1*  HCT 33.9*  PLT 393   Recent Labs    04/22/19 0835  04/22/19 1048  NA 137 137  K 4.0 4.0  CL 100 102  CO2 27 26  GLUCOSE 78 115*  BUN 7* 7*  CREATININE 0.51 0.50  CALCIUM 8.6* 8.6*    Physical Exam: BP (!) 104/52 (BP Location: Left Arm)   Pulse 64   Temp 98.6 F (37 C)   Resp 16   Ht 5\' 7"  (1.702 m)   Wt 59.3 kg   SpO2 93%   BMI 20.48 kg/m  Constitutional: No distress . Vital signs reviewed. HEENT: EOMI, oral membranes moist Neck: supple Cardiovascular: RRR without murmur. No JVD    Respiratory: CTA Bilaterally without wheezes or rales. Normal effort    GI: BS +, non-tender, non-distended  Skin: Warm and dry.  Intact. Psych: Normal mood.  Normal behavior. cooperative Musc: Right lower extremity with tenderness and 1+ edema Neurological:  Alert Motor: Left upper extremity: 5/5 proximal distal Right upper extremity: Proximally 0-15, handgrip 4/5 with apraxia still preesent Left lower extremity: Hip flexion, knee extension 4 -/5, ankle dorsiflexion 4/5 Right lower extremity: Hip flexion, knee extension 3/5, ankle dorsiflexion 4/5 Lumbar back: Tenderness to palpation over upper lumbar and lower thoracic spine ongoing   Assessment/Plan: 1. Functional deficits secondary to brain and spine metastatic disease which require 3+ hours per day of interdisciplinary therapy in a comprehensive inpatient rehab setting.  Physiatrist is providing close team supervision and 24 hour management of active medical problems listed below.  Physiatrist and rehab team continue to assess barriers to discharge/monitor patient progress toward functional and medical goals  Care Tool:  Bathing    Body parts bathed by patient: Chest, Abdomen, Front perineal area, Right upper leg, Buttocks, Left upper leg, Right lower leg, Left lower leg, Face   Body parts bathed by helper: Right arm, Left arm     Bathing assist Assist Level: Minimal Assistance - Patient > 75%     Upper Body Dressing/Undressing Upper body dressing   What is the patient  wearing?: Pull over shirt  Upper body assist Assist Level: Moderate Assistance - Patient 50 - 74%    Lower Body Dressing/Undressing Lower body dressing      What is the patient wearing?: (patient is not wearing any pants, stated for easy toileting)     Lower body assist Assist for lower body dressing: Minimal Assistance - Patient > 75%     Toileting Toileting Toileting Activity did not occur (Clothing management and hygiene only): N/A (no void or bm)  Toileting assist Assist for toileting: Minimal Assistance - Patient > 75%     Transfers Chair/bed transfer  Transfers assist     Chair/bed transfer assist level: Supervision/Verbal cueing     Locomotion Ambulation   Ambulation assist      Assist level: Supervision/Verbal cueing Assistive device: Walker-rolling Max distance: 70 ft   Walk 10 feet activity   Assist     Assist level: Supervision/Verbal cueing Assistive device: Walker-rolling, Orthosis   Walk 50 feet activity   Assist Walk 50 feet with 2 turns activity did not occur: Safety/medical concerns(Decreased strength/activity tolerance)  Assist level: Supervision/Verbal cueing Assistive device: Walker-rolling, Orthosis    Walk 150 feet activity   Assist Walk 150 feet activity did not occur: Safety/medical concerns(Decreased strength/activity tolerance)         Walk 10 feet on uneven surface  activity   Assist Walk 10 feet on uneven surfaces activity did not occur: Safety/medical concerns(Decreased strength/activity tolerance)         Wheelchair     Assist   Type of Wheelchair: Manual Wheelchair activity did not occur: Safety/medical concerns(R hemi, patient unable to propel without skilled intervention)  Wheelchair assist level: Supervision/Verbal cueing Max wheelchair distance: 50 ft    Wheelchair 50 feet with 2 turns activity    Assist    Wheelchair 50 feet with 2 turns activity did not occur: Safety/medical  concerns(R hemi, patient unable to propel without skilled intervention)   Assist Level: Supervision/Verbal cueing   Wheelchair 150 feet activity     Assist Wheelchair 150 feet activity did not occur: Safety/medical concerns(R hemi, patient unable to propel without skilled intervention)          Medical Problem List and Plan: 1.  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   Continue CIR therapies  PRAFO to right lower extremity to prevent plantarflexion contracture 2.  Antithrombotics: -RLE DVT/anticoagulation: increased size of clots/veins on doppler---changing to eliquis for rx.              -antiplatelet therapy: N/A 3. Pain Management: Norco prn             Monitor with increased mobility  12/22: In significant pain this morning, present in her back. Added Norco10mg  for severe pain for better pain control and to allow her to participate in therapy.   12/23: Mrs. Weintraub's pain is better controlled but she is asking for pain medication every 3.5 hours. Pain is present in her lower thoracic/upper lumbar spine and has a throbbing quality. The pain in her legs is much improved. She appears uncomfortable. She has been performing better in therapy after increasing her pain medication. Will increase frequency to [facility].  12/24: Mrs. Nickelson's pain is better controlled today after palliative care scheduled her tylenol and gabapentin yesterday. Spoke with palliative care today, and they also recommended adding long-acting pain control as well. I have added Oxycontin 10mg  q12H.   12/25: Since she is still requesting IR oxy  prior to next dose, I will increased Oxycontin to 15mg  BID  12/26: Pain now tolerable, but when asked if still severe at times, she says yes. Will increase Oxycontin to 20mg  BID. Discussed the possibility of a Fentanyl patch for better pain control. Currently tolerating increased Oxy doses well.   12/27: Continuing to  ask for breakthrough oxycodone every 3-4 hours. Pain is still always present, not worse at particular time of day, 6 or 7 in intensity. Discussed transitioning to Fentanyl patch for better pain control and patient is agreeable. Spoke with Corene Cornea from pharmacy regarding transition. Was previously on approximately 180 morphine equivalents with Oxycontin/Oxycodone q3H and will be transitioned to 120 morphine equivalents with Fentanyl patch 8mcg/Oxycodone q4H.    12/28: pain seems much better controlled today. consider titrating to fentanyl 82mcg patch at discharge  4. Mood: LCSW to follow for evaluation              -antipsychotic agents: N/A 5. Neuropsych: This patient is capable of making decisions on her own behalf. 6. Skin/Wound Care: Routine pressure relief measures 7. Fluids/Electrolytes/Nutrition: Ensure Enlive po BID for increased nutritional needs due to cancer/hypoalbuminemia, appreciate dietary input.  CMP within acceptable range 12/28 8. Stage 4 adenocarcinoma of the lung with metastases to the brain and spine:             -Post-op brain radiation to start on Jan 4th.             -Specimen sent for genetic analysis, results should take 7-10 days             Palliative care consulted; input appreciated.  9. Spasticity:   Baclofen 5mg  TID for increased tone in right lower extremity, monitor for sedation.  10.  Leukocytosis-likely related to CA  WBC's 10.2 12/18 11. Constipation: Will DC Senna-Docusate since now having regular BM  12/27: Given increase in opioids and likely opioid induced constipation, resumed senna-docusate BID.      LOS: 9 days A FACE TO FACE EVALUATION WAS PERFORMED  Meredith Staggers 04/22/2019, 11:54 AM

## 2019-04-22 NOTE — Discharge Instructions (Addendum)
Inpatient Rehab Discharge Instructions  [name] Discharge date and time:  04/23/19  Activities/Precautions/ Functional Status: Activity: no lifting, driving, or strenuous exercise till cleared by MD Diet: regular diet Wound Care: keep wound clean and dry    Functional status:  ___ No restrictions     ___ Walk up steps independently _X__ 24/7 supervision/assistance   ___ Walk up steps with assistance ___ Intermittent supervision/assistance  ___ Bathe/dress independently ___ Walk with walker     ___ Bathe/dress with assistance ___ Walk Independently    ___ Shower independently ___ Walk with assistance    _X__ Shower with assistance _X__ No alcohol     ___ Return to work/school ________   COMMUNITY REFERRALS UPON DISCHARGE:    Outpatient: PT     OT    ST                   Agency:  [facility]     Phone: [phone]                Appointment Date/Time:  12/30 @ 11:00 am  Medical Equipment/Items Ordered:  Transport wheelchair, walker, commode                                                      Agency/Supplier: [employer] @ [phone]   Special Instructions: 1. Prior authorization has been sent for the fentanyl patches--can take upto 5 days to hear back but hope for it to be expedited.  2. You have narcan nasal spray --use in case of overdose/excessive sedation or call 911.   My questions have been answered and I understand these instructions. I will adhere to these goals and the provided educational materials after my discharge from the hospital.  Patient/Caregiver Signature _______________________________ Date __________  Clinician Signature _______________________________________ Date __________  Please bring this form and your medication list with you to all your follow-up doctor's appointments.  ------------------------------------------------------------------------------------------------------------------------------ Information on my medicine -  ELIQUIS (apixaban)  This medication education was reviewed with me or my healthcare representative as part of my discharge preparation.  The pharmacist that spoke with me during my hospital stay was:  Donnamae Jude, [facility]  Why was Eliquis prescribed for you? Eliquis was prescribed to treat blood clots that may have been found in the veins of your legs (deep vein thrombosis) or in your lungs (pulmonary embolism) and to reduce the risk of them occurring again.  What do You need to know about Eliquis ? The starting dose is 10mg  (TWO 5mg  tablets) for 7 days then on 04/29/19 decrease to ONE 5 mg tablet taken TWICE daily.  Eliquis may be taken with or without food.   Try to take the dose about the same time in the morning and in the evening. If you have difficulty swallowing the tablet whole please discuss with your pharmacist how to take the medication safely.  Take Eliquis exactly as prescribed and DO NOT stop taking Eliquis without talking to the doctor who prescribed the medication.  Stopping may increase your risk of developing a new blood clot.  Refill your prescription before you run out.  After discharge, you should have regular check-up appointments with your healthcare provider that is prescribing your Eliquis.    What do you do if you miss a dose? If a dose of  ELIQUIS is not taken at the scheduled time, take it as soon as possible on the same day and twice-daily administration should be resumed. The dose should not be doubled to make up for a missed dose.  Important Safety Information A possible side effect of Eliquis is bleeding. You should call your healthcare provider right away if you experience any of the following: ? Bleeding from an injury or your nose that does not stop. ? Unusual colored urine (red or dark brown) or unusual colored stools (red or black). ? Unusual bruising for unknown reasons. ? A serious fall or if you hit your head (even if there is no bleeding).  Some  medicines may interact with Eliquis and might increase your risk of bleeding or clotting while on Eliquis. To help avoid this, consult your healthcare provider or pharmacist prior to using any new prescription or non-prescription medications, including herbals, vitamins, non-steroidal anti-inflammatory drugs (NSAIDs) and supplements.  This website has more information on Eliquis (apixaban): http://www.eliquis.com/eliquis/home  ---------------------------------------------------------------------------------------------------- Deep Vein Thrombosis   Deep vein thrombosis (DVT) is a condition in which a blood clot forms in a deep vein, such as a lower leg, thigh, or arm vein. A clot is blood that has thickened into a gel or solid. This condition is dangerous. It can lead to serious and even life-threatening complications if the clot travels to the lungs and causes a blockage (pulmonary embolism). It can also damage veins in the leg. This can result in leg pain, swelling, discoloration, and sores (post-thrombotic syndrome). What are the causes? This condition may be caused by:  A slowdown of blood flow.  Damage to a vein.  A condition that causes blood to clot more easily, such as an inherited clotting disorder. What increases the risk? The following factors may make you more likely to develop this condition:  Being overweight.  Being older, especially over age 86.  Sitting or lying down for more than four hours.  Being in the hospital.  Lack of physical activity (sedentary lifestyle).  Pregnancy, being in childbirth, or having recently given birth.  Taking medicines that contain estrogen, such as medicines to prevent pregnancy.  Smoking.  A history of any of the following: ? Blood clots or a blood clotting disease. ? Peripheral vascular disease. ? Inflammatory bowel disease. ? Cancer. ? Heart disease. ? Genetic conditions that affect how your blood clots, such as Factor V  Leiden mutation. ? Neurological diseases that affect your legs (leg paresis). ? A recent injury, such as a car accident. ? Major or lengthy surgery. ? A central line placed inside a large vein. What are the signs or symptoms? Symptoms of this condition include:  Swelling, pain, or tenderness in an arm or leg.  Warmth, redness, or discoloration in an arm or leg. If the clot is in your leg, symptoms may be more noticeable or worse when you stand or walk. Some people may not develop any symptoms. How is this diagnosed? This condition is diagnosed with:  A medical history and physical exam.  Tests, such as: ? Blood tests. These are done to check how well your blood clots. ? Ultrasound. This is done to check for clots. ? Venogram. For this test, contrast dye is injected into a vein and X-rays are taken to check for any clots. How is this treated? Treatment for this condition depends on:  The cause of your DVT.  Your risk for bleeding or developing more clots.  Any other medical conditions that you  have. Treatment may include:  Taking a blood thinner (anticoagulant). This type of medicine prevents clots from forming. It may be taken by mouth, injected under the skin, or injected through an IV (catheter).  Injecting clot-dissolving medicines into the affected vein (catheter-directed thrombolysis).  Having surgery. Surgery may be done to: ? Remove the clot. ? Place a filter in a large vein to catch blood clots before they reach the lungs. Some treatments may be continued for up to six months. Follow these instructions at home: If you are taking blood thinners:  Take the medicine exactly as told by your health care provider. Some blood thinners need to be taken at the same time every day. Do not skip a dose.  Talk with your health care provider before you take any medicines that contain aspirin or NSAIDs. These medicines increase your risk for dangerous bleeding.  Ask your health  care provider about foods and drugs that could change the way the medicine works (may interact). Avoid those things if your health care provider tells you to do so.  Blood thinners can cause easy bruising and may make it difficult to stop bleeding. Because of this: ? Be very careful when using knives, scissors, or other sharp objects. ? Use an electric razor instead of a blade. ? Avoid activities that could cause injury or bruising, and follow instructions about how to prevent falls.  Wear a medical alert bracelet or carry a card that lists what medicines you take. General instructions  Take over-the-counter and prescription medicines only as told by your health care provider.  Return to your normal activities as told by your health care provider. Ask your health care provider what activities are safe for you.  Wear compression stockings if recommended by your health care provider.  Keep all follow-up visits as told by your health care provider. This is important. How is this prevented? To lower your risk of developing this condition again:  For 30 or more minutes every day, do an activity that: ? Involves moving your arms and legs. ? Increases your heart rate.  When traveling for longer than four hours: ? Exercise your arms and legs every hour. ? Drink plenty of water. ? Avoid drinking alcohol.  Avoid sitting or lying for a long time without moving your legs.  If you have surgery or you are hospitalized, ask about ways to prevent blood clots. These may include taking frequent walks or using anticoagulants.  Stay at a healthy weight.  If you are a woman who is older than age 53, avoid unnecessary use of medicines that contain estrogen, such as some birth control pills.  Do not use any products that contain nicotine or tobacco, such as cigarettes and e-cigarettes. This is especially important if you take estrogen medicines. If you need help quitting, ask your health care  provider. Contact a health care provider if:  You miss a dose of your blood thinner.  Your menstrual period is heavier than usual.  You have unusual bruising. Get help right away if:  You have: ? New or increased pain, swelling, or redness in an arm or leg. ? Numbness or tingling in an arm or leg. ? Shortness of breath. ? Chest pain. ? A rapid or irregular heartbeat. ? A severe headache or confusion. ? A cut that will not stop bleeding.  There is blood in your vomit, stool, or urine.  You have a serious fall or accident, or you hit your head.  You feel  light-headed or dizzy.  You cough up blood. These symptoms may represent a serious problem that is an emergency. Do not wait to see if the symptoms will go away. Get medical help right away. Call your local emergency services (911 in the [place] Do not drive yourself to the hospital. Summary  Deep vein thrombosis (DVT) is a condition in which a blood clot forms in a deep vein, such as a lower leg, thigh, or arm vein.  Symptoms can include swelling, warmth, pain, and redness in your leg or arm.  This condition may be treated with a blood thinner (anticoagulant medicine), medicine that is injected to dissolve blood clots,compression stockings, or surgery.  If you are prescribed blood thinners, take them exactly as told. This information is not intended to replace advice given to you by your health care provider. Make sure you discuss any questions you have with your health care provider. Document Released: 04/11/2005 Document Revised: 03/24/2017 Document Reviewed: 09/09/2016 Elsevier Patient Education  2020 [employer].

## 2019-04-22 NOTE — Progress Notes (Signed)
Physical Therapy Discharge Summary  Patient Details  Name: [name] MRN: [id number] Date of Birth: Apr 18, 1956  Today's Date: 04/22/2019 PT Individual Time: 1497-0263 and 7858-8502 PT Individual Time Calculation (min): 59 min and 60 min   Patient has met 10 of 10 long term goals due to improved activity tolerance, improved balance, improved postural control, increased strength, ability to compensate for deficits, functional use of  right lower extremity and improved awareness.  Patient to discharge at an ambulatory level supervision with RW.   Patient's care partner is independent to provide the necessary physical assistance at discharge.  Reasons goals not met: n/a  Recommendation:  Patient will benefit from ongoing skilled PT services in outpatient setting to continue to advance safe functional mobility, address ongoing impairments in R NMR & strength, endurance, balance, transfers, gait, stair negotiation, and minimize fall risk.  Equipment: RW with R hand orthosis, transport w/c  Reasons for discharge: treatment goals met and discharge from hospital  Patient/family agrees with progress made and goals achieved: Yes  Skilled PT Treatment: Treatment 1: Pt received in recliner & agreeable to tx. Pt reports 6/10 pain in L side of back, & L area below breast during session, but states she's premedicated & rest breaks provided PRN throughout session. Educated pt on need to secure or remove throw rugs at home. Pt eager for d/c tomorrow. Pt transfers sit>stand from recliner with supervision and pt managing RUE on hand orthosis. Pt ambulates in room/bathroom with RW & close supervision, manages clothing without assistance and completes toilet transfer with supervision. Pt requires extra time on toilet to have continent BM and performs peri hygiene without assistance. Pt performs hand hygiene standing at sink with cuing to square up to sink & supervision overall. Transported pt to gym via w/c  dependent assist. Pt negotiates ramp & mulch with RW & close supervision - educated pt on need for close supervision for mobility at d/c. Pt completes car transfer at SUV simulated height with min cuing for sequencing, supervision overall, and pt using UE to assist RLE in/out of car. Pt completes transfer from stable recliner in apartment with supervision and sit<>R sidelying in apartment bed with supervision and min cuing for eccentric lowering. At end of session pt assisted back to bed & left with all needs in reach, 4 rails up per pt request. Discussed need for safety with mobility and ensuring pets do not get underfoot at home with pt reporting understanding.  Treatment 2: Pt received in room with husband & nurse present in room. Pt agreeable to tx. Pt reports need to use restroom & ambulated in room/bathroom with RW & supervision, completing toilet transfer with supervision and managing clothing without assistance - pt with continent void on toilet. Pt performed hand hygiene standing at sink with supervision. Educated pt & husband on inability to drive until cleared by MD and discussed OPPT f/u. Pt's husband participated in session, with therapist educating him on positioning in relation to pt during gait. In ortho gym, pt completed car transfer at SUV simulated height with supervision and 1 cue to attend to [place]. Pt ambulates 150 ft with RW & husband providing supervision. Pt negotiated 24 steps (6" + 3") with L rail, close supervision and frequent cuing for compensatory pattern. Pt then requested to end session 2/2 fatigue. Pt returned to room and ambulated to recliner with RW & supervision. Pt left in recliner with chair alarm donned & all needs in reach, husband present in room, RUE elevated on  pillows.  Manual testing completed, see below.  Pain: pt with behaviors demonstrating pain in L rib/lower breast area with rest breaks provided PRN.  PT  Discharge Precautions/Restrictions Precautions Precautions: Fall Precaution Comments: Lt crani, pathologic fx L3, R hemi Restrictions Weight Bearing Restrictions: No  Vision/Perception  Pt wears glasses for reading only at baseline. No changes in baseline vision. Perception WNL. Praxis intact.  Cognition Overall Cognitive Status: Within Functional Limits for tasks assessed Arousal/Alertness: Awake/alert Orientation Level: Oriented X4 Sustained Attention: Appears intact Memory: Appears intact Awareness: Appears intact Problem Solving: Appears intact Safety/Judgment: Appears intact  Sensation Sensation Light Touch: (intact BLE) Proprioception: (intact BLE) Additional Comments: pt reports tingling in RUE shoulder to elbow Coordination Gross Motor Movements are Fluid and Coordinated: No Fine Motor Movements are Fluid and Coordinated: No Coordination and Movement Description: R hemi UE>LE   Motor  Motor Motor: Abnormal postural alignment and control Motor - Discharge Observations: R hemiplegia (UE>LE), generalized deconditioning   Mobility Bed Mobility Bed Mobility: Right Sidelying to Sit;Sit to Sidelying Right Right Sidelying to Sit: Supervision/Verbal cueing Sit to Sidelying Right: Supervision/Verbal cueing Transfers Transfers: Sit to Stand;Stand to Sit Sit to Stand: Supervision/Verbal cueing Stand to Sit: Supervision/Verbal cueing Transfer (Assistive device): Rolling walker   Locomotion  Gait Ambulation: Yes Gait Assistance: Supervision/Verbal cueing Gait Distance (Feet): 150 Feet Assistive device: Rolling walker(R hand orthosis) Gait Gait: Yes Gait Pattern: Decreased weight shift to right;Decreased dorsiflexion - right;Decreased hip/knee flexion - right;Decreased stride length;Decreased step length - right;Decreased stance time - right Gait velocity: decreased Stairs / Additional Locomotion Stairs: Yes Stairs Assistance: Supervision/Verbal cueing Stair  Management Technique: One rail Left Number of Stairs: 24 Height of Stairs: (6" + 3") Ramp: Supervision/Verbal cueing(ambulatory with RW) Wheelchair Mobility Wheelchair Mobility: No   Trunk/Postural Assessment  Cervical Assessment Cervical Assessment: Exceptions to [facility] (forward head) Thoracic Assessment Thoracic Assessment: Exceptions to WFL(rounded shoulders, R shoulder depression) Lumbar Assessment Lumbar Assessment: Within Functional Limits Postural Control Postural Control: Deficits on evaluation Righting Reactions: delayed Protective Responses: delayed    Balance Balance Balance Assessed: Yes  Static Sitting Balance Static Sitting - Balance Support: Feet supported Static Sitting - Level of Assistance: 5: Stand by assistance  Static Standing Balance Static Standing - Balance Support: During functional activity;Right upper extremity supported(hand hygiene standing at sink, RUE resting on edge of sink) Static Standing - Level of Assistance: 5: Stand by assistance  Dynamic Standing Balance Dynamic Standing - Balance Support: Bilateral upper extremity supported;During functional activity Dynamic Standing - Level of Assistance: 5: Stand by assistance Dynamic Standing - Comments: during gait with RW    Extremity Assessment  Per OT assessment: RUE Assessment RUE Assessment: Exceptions to [facility] General Strength Comments: functional grasp/release, very little activation in proximal shoulder/elbow RUE Body System: Neuro Brunstrum levels for arm and hand: Arm;Hand Brunstrum level for arm: Stage I Presynergy Brunstrum level for hand: Stage VI Isolated joint movements  LUE Assessment LUE Assessment: Within Functional Limits RLE Assessment RLE Assessment: Exceptions to [facility] General Strength Comments: in sitting: hip flexion = 3/5, knee flexion/extension = 3+/5, dorsiflexion = 3+/5 LLE Assessment LLE Assessment: Within Functional Limits    Waunita Schooner 04/22/2019,  3:37 PM

## 2019-04-22 NOTE — Plan of Care (Signed)

## 2019-04-22 NOTE — Progress Notes (Signed)
  Patient ID: [name], female   DOB: Feb 09, 1956, 63 y.o.   MRN: [id number]    Diagnosis codes:  C79.31;  C79.51  Height:  5'7"  Weight:  130 lbs   Patient suffers from a metastatic cancer to brain and lumbar spine which impairs their ability to perform daily activities like toileting,grooming, bathing and mobility in the home.  A walker or cane will not resolve the issue with performing these ADLs.  A wheelchair will allow patient to safely perform daily activities.  The patient is not able to propel themselves in the home using a standard wheelchair due to general weakness and endurance.  A caregiver is available, willing and able to provide assistance with the wheelchair.   Reesa Chew, PA-C

## 2019-04-22 NOTE — Progress Notes (Signed)
Looks like that there is blood in has a progressive clot in the right leg.  She has some leg swelling yesterday.  She underwent another Doppler.  It looks like for the report shows that this is a progressive thrombus..  She is on twice a day Lovenox.  I am somewhat surprised that this has not worked.  We will probably try her on oral therapy now.  I thought she supposed be on heparin but this has not been started.  The leg really is not that swollen.  It is not painful.  We will try her on Eliquis.  I think this would be reasonable.  We will have to hold off on the Port-A-Cath for right now.  Letter to get this as an outpatient.  There is been no labs for about a week.  But her labs have been doing pretty well.  Her appetite is doing pretty well.  She has had consistent pain over on that left side.  I would like to think that radiation oncology will see her soon.  She might be going home tomorrow.  Hopefully she will be able to still go home tomorrow.  It would be nice to get some labs on her so we see how things look.  Lattie Haw, MD  Psalm 143:8

## 2019-04-22 NOTE — Progress Notes (Signed)
Occupational Therapy Discharge Summary  Patient Details  Name: [name] MRN: [id number] Date of Birth: [birth year]  Today's Date: 04/22/2019 OT Individual Time: 5456-2563 OT Individual Time Calculation (min): 56 min    Pt greeted semi-reclined in bed and agreeable to OT treatment session. Pt reported pain was better managed overnight. Pt agreeable to bathing/dressing this morning. Pt completed sit<>stand with RW and close supervision. Pt ambulated to the bathroom and transferred onto [facility] over toilet with close supervision. Pt voided bladder and had successful BM. Pt completed peri-care without assistance. Bathing completed from shower with focus on R UE NMR with weight bearing during bathing tasks. Dressing completed with min cues for hemi-dressing techniques, but no physical assistance. Pt stood at the sink to comb and blowdry hair with supervision. Pt tolerated standing for 3 minutes during task. Pt requested to sit in recliner at end of session. Pt left sitting in reclined with call bell in reach and needs met.    Patient has met 9 of 9 long term goals due to improved activity tolerance, improved balance, postural control, ability to compensate for deficits, functional use of  RIGHT upper and RIGHT lower extremity, improved awareness and improved coordination.  Patient to discharge at overall Supervision level.  Patient's care partner is independent to provide the necessary physical assistance at discharge.    Reasons goals not met: n/a  Recommendation:  Patient will benefit from ongoing skilled OT services in outpatient setting to continue to advance functional skills in the area of BADL and functional use of R UE.  Equipment: RW, hand splint, 3-in-1 BSC  Reasons for discharge: treatment goals met and discharge from hospital  Patient/family agrees with progress made and goals achieved: Yes  OT Discharge Precautions/Restrictions  Precautions Precautions: Fall Precaution Comments:  Lt crani, pathologic fx L3, R hemi Restrictions Weight Bearing Restrictions: No Pain Pain Assessment Pain Scale: 0-10 Pain Score: 4  Pain Type: Acute pain Pain Location: Shoulder Pain Orientation: Right Pain Radiating Towards: back Pain Descriptors / Indicators: Aching;Discomfort;Dull;Pressure;Sore Pain Frequency: Constant Pain Onset: On-going Pain Intervention(s): Repositioned ADL ADL Eating: Independent Grooming: Modified independent Where Assessed-Grooming: Sitting at sink Upper Body Bathing: Supervision/safety Where Assessed-Upper Body Bathing: Shower Lower Body Bathing: Supervision/safety Where Assessed-Lower Body Bathing: Shower Upper Body Dressing: Supervision/safety Where Assessed-Upper Body Dressing: Wheelchair Lower Body Dressing: Supervision/safety Where Assessed-Lower Body Dressing: Wheelchair Toileting: Supervision/safety Armed forces technical officer: Close supervision Armed forces technical officer Method: Arts development officer: Energy manager: Close supervision \Perception  Perception: Within Advertising copywriter Praxis Praxis: Intact Cognition Overall Cognitive Status: Within Functional Limits for tasks assessed Arousal/Alertness: Awake/alert Orientation Level: Oriented X4 Attention: Focused;Sustained Focused Attention: Appears intact Sustained Attention: Appears intact Memory: Appears intact Awareness: Appears intact Problem Solving: Appears intact Safety/Judgment: Appears intact Sensation Sensation Light Touch: (intact BLE) Light Touch Impaired Details: Impaired RUE Proprioception: (intact BLE) Additional Comments: pt reports tingling in RUE shoulder to elbow Coordination Gross Motor Movements are Fluid and Coordinated: No Fine Motor Movements are Fluid and Coordinated: No Coordination and Movement Description: R hemi UE>LE Finger Nose Finger Test: Unable to test on R due to weakness Motor  Motor Motor: Abnormal postural alignment and  control Motor - Discharge Observations: R hemiplegia (UE>LE), generalized deconditioning Mobility  Bed Mobility Bed Mobility: Right Sidelying to Sit;Sit to Sidelying Right Right Sidelying to Sit: Supervision/Verbal cueing Supine to Sit: Supervision/Verbal cueing Sit to Supine: Supervision/Verbal cueing Sit to Sidelying Right: Supervision/Verbal cueing Transfers Sit to Stand: Supervision/Verbal cueing Stand to Sit: Supervision/Verbal cueing  Trunk/Postural  Assessment  Cervical Assessment Cervical Assessment: Exceptions to WFL(forward head) Thoracic Assessment Thoracic Assessment: Exceptions to WFL(rounded shoulders, R shoulder depression) Lumbar Assessment Lumbar Assessment: Within Functional Limits  Balance Balance Balance Assessed: Yes Dynamic Sitting Balance Sitting balance - Comments: supervision assist for dynamic sitting during LB dressing tasks, Mod A for dynamic balance during standing portions of dressing tasks Extremity/Trunk Assessment RUE Assessment RUE Assessment: Exceptions to [facility] General Strength Comments: functional grasp/release, very little activation in proximal shoulder/elbow RUE Body System: Neuro Brunstrum levels for arm and hand: Arm;Hand Brunstrum level for arm: Stage I Presynergy Brunstrum level for hand: Stage VI Isolated joint movements LUE Assessment LUE Assessment: Within Functional Limits Active Range of Motion (AROM) Comments: WNL General Strength Comments: 4/5 grossly   Valma Cava 04/22/2019, 3:20 PM

## 2019-04-22 NOTE — Progress Notes (Addendum)
ANTICOAGULATION CONSULT NOTE - Initial Consult  Pharmacy Consult for Eliquis Indication: progressive DVT 04/08/19  Allergies  Allergen Reactions  . Codeine   . Lisinopril-Hydrochlorothiazide Other (See Comments)    Back pain    Patient Measurements: Height: 5\' 7"  (170.2 cm) Weight: 130 lb 11.7 oz (59.3 kg) IBW/kg (Calculated) : 61.6  Vital Signs: Temp: 98.6 F (37 C) (12/28 0541) Temp Source: Oral (12/27 2012) BP: 104/52 (12/28 0541) Pulse Rate: 64 (12/28 0541)  Estimated Creatinine Clearance: 67.4 mL/min (A) (by C-G formula based on SCr of 0.43 mg/dL (L)).  Assessment: 63yo female w/ progressive RLE DVT to transition from LMWH to Eliquis. Received enoxaparin treatment dose from 12/14- 12/27 and had progressive RLE DVT despite enoxaparin. Will switch to apixaban.  Plan:  Eliquis 10mg  BID x 7 days then decrease to 5mg  BID  Benetta Spar, PharmD, BCPS, [facility] Clinical Pharmacist  Please check AMION for all [place] phone numbers After 10:00 PM, call [place] [phone]

## 2019-04-23 DIAGNOSIS — D72823 Leukemoid reaction: Secondary | ICD-10-CM

## 2019-04-23 LAB — CBC
HCT: 35.1 % — ABNORMAL LOW (ref 36.0–46.0)
Hemoglobin: 11.6 g/dL — ABNORMAL LOW (ref 12.0–15.0)
MCH: 30.8 pg (ref 26.0–34.0)
MCHC: 33 g/dL (ref 30.0–36.0)
MCV: 93.1 fL (ref 80.0–100.0)
Platelets: 387 10*3/uL (ref 150–400)
RBC: 3.77 MIL/uL — ABNORMAL LOW (ref 3.87–5.11)
RDW: 13.7 % (ref 11.5–15.5)
WBC: 9.6 10*3/uL (ref 4.0–10.5)
nRBC: 0 % (ref 0.0–0.2)

## 2019-04-23 MED ORDER — DRONABINOL 5 MG PO CAPS: 5.0000 mg | ORAL_CAPSULE | Freq: Two times a day (BID) | ORAL | 0 refills | Status: DC

## 2019-04-23 MED ORDER — FENTANYL 25 MCG/HR TD PT72
1.0000 | MEDICATED_PATCH | TRANSDERMAL | Status: DC
  Administered 2019-04-23: 1 via TRANSDERMAL
  Filled 2019-04-23: qty 1

## 2019-04-23 MED ORDER — APIXABAN 5 MG PO TABS: 5.0000 mg | ORAL_TABLET | Freq: Two times a day (BID) | ORAL | 1 refills | Status: DC

## 2019-04-23 MED FILL — BACLOFEN 10 MG TABS: 10 | 30 days supply | Qty: 45 | Fill #0

## 2019-04-23 MED FILL — ELIQUIS STARTER PACK 5 MG T: 5 | 30 days supply | Qty: 74 | Fill #0

## 2019-04-23 MED FILL — PANTOPRAZOLE SOD DR 40 MG T: 40 | 30 days supply | Qty: 30 | Fill #0

## 2019-04-23 MED FILL — SENEXON-S 8.6-50 MG TABS: 8.6-50 | 30 days supply | Qty: 60 | Fill #0

## 2019-04-23 MED FILL — levETIRAcetam 500 MG TABS: 500 | 30 days supply | Qty: 60 | Fill #0

## 2019-04-23 MED FILL — DRONABINOL 5 MG CAP: 5 | 30 days supply | Qty: 60 | Fill #0

## 2019-04-23 MED FILL — NARCAN 4 MG NASAL SPRAY: 4 | 2 days supply | Qty: 2 | Fill #0

## 2019-04-23 MED FILL — traZODone HCL 50 MG TABS: 50 | 30 days supply | Qty: 30 | Fill #0

## 2019-04-23 MED FILL — PROMETHAZINE 12.5 MG TABLET: 12.5 | 3 days supply | Qty: 30 | Fill #0

## 2019-04-23 MED FILL — METHOCARBAMOL 500 MG TABS: 500 | 15 days supply | Qty: 60 | Fill #0

## 2019-04-23 MED FILL — oxyCODONE HCL 10 MG TABS: 10 | 20 days supply | Qty: 150 | Fill #0

## 2019-04-23 MED FILL — GABAPENTIN 300 MG CAPSULE: 300 | 30 days supply | Qty: 90 | Fill #0

## 2019-04-23 MED FILL — ELIQUIS 5 MG TABLET: 5 | 30 days supply | Qty: 60 | Fill #0

## 2019-04-23 NOTE — Progress Notes (Signed)
Pt discharging with personal property to home. Equipment was taken home yesterday. [name] is providing discharge instructions, medication instructions, as well as follow up appts. Pt and husband verbalized an understanding of all information provided to them. Fentanyl 61mcg/hr was placed to pt left ABD and 43mcg patch removed. Discussed with husband importance of removing used prior to placing new patch and rotating cites.

## 2019-04-23 NOTE — Progress Notes (Signed)
HEMATOLOGY-ONCOLOGY PROGRESS NOTE  SUBJECTIVE: Still having some swelling and mild discomfort in her right lower extremity.  She was started on Eliquis yesterday.  Reports some mild back pain today.  Just finished working with therapy.  States that she is being discharged to home and is awaiting her discharge paperwork.  REVIEW OF SYSTEMS:   Review of systems was noncontributory except as noted in the HPI.  I have reviewed the past medical history, past surgical history, social history and family history with the patient and they are unchanged from previous note.   PHYSICAL EXAMINATION: ECOG PERFORMANCE STATUS: 2 - Symptomatic, <50% confined to bed  Vitals:   04/22/19 1929 04/23/19 0529  BP: 128/74 124/79  Pulse: 73 73  Resp: 16 18  Temp: 98.3 F (36.8 C) 98.2 F (36.8 C)  SpO2: 95% 95%   Filed Weights   04/13/19 1638  Weight: 130 lb 11.7 oz (59.3 kg)    Intake/Output from previous day: 12/28 0701 - 12/29 0700 In: 840 [P.O.:840] Out: -   GENERAL:alert, no distress and comfortable, sitting up in wheelchair EXTREMITIES: Right lower extremity with mild tenderness and 1+ edema which is unchanged, no edema or tenderness in the right lower extremity NEURO: alert & oriented x 3 with fluent speech, no focal motor/sensory deficits  LABORATORY DATA:  I have reviewed the data as listed CMP Latest Ref Rng & Units 04/22/2019 04/22/2019 04/15/2019  Glucose 70 - 99 mg/dL 115(H) 78 97  BUN 8 - 23 mg/dL 7(L) 7(L) 10  Creatinine 0.44 - 1.00 mg/dL 0.50 0.51 0.43(L)  Sodium 135 - 145 mmol/L 137 137 137  Potassium 3.5 - 5.1 mmol/L 4.0 4.0 3.9  Chloride 98 - 111 mmol/L 102 100 102  CO2 22 - 32 mmol/L 26 27 25   Calcium 8.9 - 10.3 mg/dL 8.6(L) 8.6(L) 8.4(L)  Total Protein 6.5 - 8.1 g/dL 6.3(L) - -  Total Bilirubin 0.3 - 1.2 mg/dL 0.3 - -  Alkaline Phos 38 - 126 U/L 182(H) - -  AST 15 - 41 U/L 18 - -  ALT 0 - 44 U/L 40 - -    Lab Results  Component Value Date   WBC 9.6 04/23/2019    HGB 11.6 (L) 04/23/2019   HCT 35.1 (L) 04/23/2019   MCV 93.1 04/23/2019   PLT 387 04/23/2019   NEUTROABS 7.9 (H) 04/22/2019    CT HEAD WO CONTRAST  Result Date: 04/01/2019 CLINICAL DATA:  Onset decreased movement in the right upper and lower extremities 1 week ago. Altered handwriting. EXAM: CT HEAD WITHOUT CONTRAST TECHNIQUE: Contiguous axial images were obtained from the base of the skull through the vertex without intravenous contrast. COMPARISON:  None. FINDINGS: Brain: There is a large area vasogenic edema in the high left frontal lobe. Hypoattenuation is seen in the basal ganglia bilaterally. No hemorrhage, midline shift or evidence of acute infarct is identified. Vascular: Atherosclerosis noted.  No hyperdense vessel. Skull: Intact.  No focal lesion. Sinuses/Orbits: Negative. Other: None. IMPRESSION: Large area vasogenic edema in the high left frontal lobe highly suspicious for neoplasm, likely metastatic disease. Brain MRI with and without contrast is recommended for further evaluation. Chest x-ray is also recommended. Hypoattenuation in the basal ganglia bilaterally is likely due to prior lacunar infarctions. These results were called by telephone at the time of interpretation on 04/01/2019 at 3:52 pm to provider [facility] , who verbally acknowledged these results. Electronically Signed   By: Inge Rise M.D.   On: 04/01/2019 15:56  CT CHEST W CONTRAST  Result Date: 04/02/2019 CLINICAL DATA:  Cancer screening EXAM: CT CHEST, ABDOMEN, AND PELVIS WITH CONTRAST TECHNIQUE: Multidetector CT imaging of the chest, abdomen and pelvis was performed following the standard protocol during bolus administration of intravenous contrast. CONTRAST:  146mL OMNIPAQUE IOHEXOL 300 MG/ML  SOLN COMPARISON:  Chest radiograph dated 04/01/2019 FINDINGS: CT CHEST FINDINGS Cardiovascular: The heart is normal in size. No pericardial effusion. No evidence of thoracic aortic aneurysm. Atherosclerotic  calcifications of the aortic arch. Three vessel coronary atherosclerosis. Mediastinum/Nodes: Thoracic lymphadenopathy, including: --10 mm short axis left supraclavicular node (series 3/image 12) --3.3 cm short axis high right paratracheal node (series 3/image 18) --2.1 cm short axis anterior mediastinal node (series 3/image 20) --2.9 cm short axis low right paratracheal node (series 3/image 23) --1.7 cm short axis mid parasymphyseal node (series 3/image 32) Visualized thyroid is unremarkable. Lungs/Pleura: 3.0 x 2.4 x 3.6 cm spiculated mass in the medial right upper lobe (series 5/image 38), compatible with primary bronchogenic neoplasm. Additional bilateral pulmonary metastases in the left upper lobe and bilateral lower lobes, approximately 20-25 in number, including: --17 mm left upper lobe nodule (series 5/image 69) --15 mm right upper lobe nodule (series 5/image 75) --16 mm medial left lower lobe nodule (series 5/image 102) Mild centrilobular and paraseptal emphysematous changes, upper lung predominant. No focal consolidation. No pleural effusion or pneumothorax. Musculoskeletal: Subtle sclerosis involving the superior endplate of J88 and C16 (sagittal images 82 and 86), nonspecific. CT ABDOMEN PELVIS FINDINGS Hepatobiliary: 8 mm cyst in segment 8 (series 3/image 51). Additional 7 mm hypoenhancing lesion in segment 8 (series 3/image 84), indeterminate. 15 mm lesion at the junction of segment 5/6 (series 3/image 89), with suspected peripheral nodular discontinuous enhancement, suggesting a benign hemangioma. Technically, this remains incompletely characterized. Gallbladder is unremarkable. No intrahepatic or extrahepatic ductal dilatation. Pancreas: Within normal limits. Spleen: Within normal limits. Adrenals/Urinary Tract: Mild thickening of the left adrenal gland, without discrete mass. Right adrenal gland is within normal limits. 8 mm medial left upper pole renal cyst (series 8/image 7). Right kidney is within  normal limits. No hydronephrosis. Bladder is within normal limits. Stomach/Bowel: Stomach is within normal limits. No evidence of bowel obstruction. Appendix is not discretely visualized. Vascular/Lymphatic: No evidence of abdominal aortic aneurysm. Atherosclerotic calcifications of the abdominal aorta and branch vessels. No suspicious abdominopelvic lymphadenopathy. Reproductive: Uterine fibroids. Bilateral ovaries are within normal limits. Other: No abdominopelvic ascites. Musculoskeletal: Destructive lytic metastasis along the anterior aspect of the L3 vertebral body (sagittal image 77). IMPRESSION: 3.6 cm spiculated mass in the medial right upper lobe, compatible with primary bronchogenic neoplasm. Bilateral pulmonary metastases. Associated thoracic nodal metastases, including a 10 mm short axis left supraclavicular nodal metastasis which may be amenable to tissue sampling/resection. Destructive lytic osseous metastasis along the anterior aspect of the L3 vertebral body. Electronically Signed   By: Julian Hy M.D.   On: 04/02/2019 12:18   MR BRAIN W WO CONTRAST  Result Date: 04/06/2019 CLINICAL DATA:  Status post tumor resection EXAM: MRI HEAD WITHOUT AND WITH CONTRAST TECHNIQUE: Multiplanar, multiecho pulse sequences of the brain and surrounding structures were obtained without and with intravenous contrast. CONTRAST:  49mL GADAVIST GADOBUTROL 1 MMOL/ML IV SOLN COMPARISON:  Brain MRI 04/04/2019 FINDINGS: Brain: Status post resection of contrast-enhancing lesion in the superior left frontal lobe. There is a small resection cavity without residual contrast enhancement. The degree of surrounding hyperintense T2-weighted signal is unchanged. There is an area of diffusion restriction inferior to the resection site,  likely indicating some acute ischemia. There is near confluent white matter hyperintensity within both hemispheres. Vascular: Flow voids are preserved Skull and upper cervical spine: Status  post left frontal craniotomy. Sinuses/Orbits: Negative Other: None IMPRESSION: 1. Status post resection of superior left frontal lobe mass with unchanged degree of surrounding hyperintense T2-weighted signal. No residual contrast enhancement. 2. Small area of diffusion restriction inferior to the resection site, likely indicating some acute ischemia. Electronically Signed   By: Ulyses Jarred M.D.   On: 04/06/2019 05:56   MR BRAIN W WO CONTRAST  Addendum Date: 04/05/2019   ADDENDUM REPORT: 04/05/2019 09:29 ADDENDUM: FA map imaging with successfully created today. The extensive edema in the region of the left vertex tumor obscures fiber tract evaluation on the left. However, using the right-side for mirror image evaluation, the lesion is in fact in the motor strip. Electronically Signed   By: Nelson Chimes M.D.   On: 04/05/2019 09:29   Result Date: 04/05/2019 CLINICAL DATA:  Right-sided weakness. Solitary metastasis posterior left frontal lobe. BrainLAB protocol EXAM: MRI HEAD WITHOUT AND WITH CONTRAST TECHNIQUE: Multiplanar, multiecho pulse sequences of the brain and surrounding structures were obtained without and with intravenous contrast. CONTRAST:  6.18mL GADAVIST GADOBUTROL 1 MMOL/ML IV SOLN COMPARISON:  None. FINDINGS: Brain: BrainLAB protocol is done for operative utilization. No change since yesterday's exam with respect to the extent of disease. 1.2 cm height left posterior frontal metastatic lesion with moderate surrounding vasogenic edema. Chronic small-vessel ischemic changes elsewhere throughout the cerebral hemispheric white matter. Old lacunar infarctions in the basal ganglia. Chronic small-vessel changes of the brainstem. Scattered punctate foci of hemosiderin deposition related to old small vessel infarctions. Vascular: Major vessels at the base of the brain show flow. Skull and upper cervical spine: Negative Sinuses/Orbits: Clear/normal Other: None IMPRESSION: BrainLAB protocol for operative  utilization. 1.2 cm solitary left posterior frontal metastasis with moderate vasogenic edema. Any post processing is deferred to standard staffing hours. Electronically Signed: By: Nelson Chimes M.D. On: 04/04/2019 19:06   MR BRAIN W WO CONTRAST  Result Date: 04/03/2019 CLINICAL DATA:  Metastatic neoplasm suspected. Right-sided hemiparesis EXAM: MRI HEAD WITHOUT AND WITH CONTRAST TECHNIQUE: Multiplanar, multiecho pulse sequences of the brain and surrounding structures were obtained without and with intravenous contrast. CONTRAST:  56mL GADAVIST GADOBUTROL 1 MMOL/ML IV SOLN COMPARISON:  Head CT from 2 days ago FINDINGS: Brain: 1.2 Cm high left posterior frontal enhancing mass along the cortex with moderate vasogenic edema. Chronic small vessel ischemia throughout the cerebral white matter with remote lacunar infarcts in the bilateral basal ganglia and brainstem. Scattered chronic blood products could be related to small vessel disease but are fairly peripheral. No acute infarct, acute hemorrhage, hydrocephalus, or collection. Vascular: Normal flow voids and vascular enhancements Skull and upper cervical spine: Normal marrow signal. Sinuses/Orbits: Negative IMPRESSION: 1. Solitary presumed metastasis in the posterior left frontal lobe with moderate vasogenic edema. 2. Extensive chronic small vessel ischemia. Electronically Signed   By: Monte Fantasia M.D.   On: 04/03/2019 06:48   NM Bone Scan Whole Body  Result Date: 04/09/2019 CLINICAL DATA:  Non-small-cell lung cancer. Severe left shoulder pain. EXAM: NUCLEAR MEDICINE WHOLE BODY BONE SCAN TECHNIQUE: Whole body anterior and posterior images were obtained approximately 3 hours after intravenous injection of radiopharmaceutical. RADIOPHARMACEUTICALS:  21.3 mCi Technetium-34m MDP IV COMPARISON:  CT 04/02/2019.  Chest x-ray 04/01/2019. FINDINGS: Bilateral renal function and excretion. Focal areas of increased activity noted over multiple ribs bilaterally, L3  vertebral body, and left  acetabular region. These findings are consistent with metastatic disease. No focal abnormalities noted about the left shoulder. IMPRESSION: Findings consistent with bony metastatic disease as above. Left shoulder region is unremarkable. Electronically Signed   By: Marcello Moores  Register   On: 04/09/2019 11:21   CT ABDOMEN PELVIS W CONTRAST  Result Date: 04/02/2019 CLINICAL DATA:  Cancer screening EXAM: CT CHEST, ABDOMEN, AND PELVIS WITH CONTRAST TECHNIQUE: Multidetector CT imaging of the chest, abdomen and pelvis was performed following the standard protocol during bolus administration of intravenous contrast. CONTRAST:  155mL OMNIPAQUE IOHEXOL 300 MG/ML  SOLN COMPARISON:  Chest radiograph dated 04/01/2019 FINDINGS: CT CHEST FINDINGS Cardiovascular: The heart is normal in size. No pericardial effusion. No evidence of thoracic aortic aneurysm. Atherosclerotic calcifications of the aortic arch. Three vessel coronary atherosclerosis. Mediastinum/Nodes: Thoracic lymphadenopathy, including: --10 mm short axis left supraclavicular node (series 3/image 12) --3.3 cm short axis high right paratracheal node (series 3/image 18) --2.1 cm short axis anterior mediastinal node (series 3/image 20) --2.9 cm short axis low right paratracheal node (series 3/image 23) --1.7 cm short axis mid parasymphyseal node (series 3/image 32) Visualized thyroid is unremarkable. Lungs/Pleura: 3.0 x 2.4 x 3.6 cm spiculated mass in the medial right upper lobe (series 5/image 38), compatible with primary bronchogenic neoplasm. Additional bilateral pulmonary metastases in the left upper lobe and bilateral lower lobes, approximately 20-25 in number, including: --17 mm left upper lobe nodule (series 5/image 69) --15 mm right upper lobe nodule (series 5/image 75) --16 mm medial left lower lobe nodule (series 5/image 102) Mild centrilobular and paraseptal emphysematous changes, upper lung predominant. No focal consolidation. No  pleural effusion or pneumothorax. Musculoskeletal: Subtle sclerosis involving the superior endplate of Z61 and W96 (sagittal images 82 and 86), nonspecific. CT ABDOMEN PELVIS FINDINGS Hepatobiliary: 8 mm cyst in segment 8 (series 3/image 51). Additional 7 mm hypoenhancing lesion in segment 8 (series 3/image 84), indeterminate. 15 mm lesion at the junction of segment 5/6 (series 3/image 89), with suspected peripheral nodular discontinuous enhancement, suggesting a benign hemangioma. Technically, this remains incompletely characterized. Gallbladder is unremarkable. No intrahepatic or extrahepatic ductal dilatation. Pancreas: Within normal limits. Spleen: Within normal limits. Adrenals/Urinary Tract: Mild thickening of the left adrenal gland, without discrete mass. Right adrenal gland is within normal limits. 8 mm medial left upper pole renal cyst (series 8/image 7). Right kidney is within normal limits. No hydronephrosis. Bladder is within normal limits. Stomach/Bowel: Stomach is within normal limits. No evidence of bowel obstruction. Appendix is not discretely visualized. Vascular/Lymphatic: No evidence of abdominal aortic aneurysm. Atherosclerotic calcifications of the abdominal aorta and branch vessels. No suspicious abdominopelvic lymphadenopathy. Reproductive: Uterine fibroids. Bilateral ovaries are within normal limits. Other: No abdominopelvic ascites. Musculoskeletal: Destructive lytic metastasis along the anterior aspect of the L3 vertebral body (sagittal image 77). IMPRESSION: 3.6 cm spiculated mass in the medial right upper lobe, compatible with primary bronchogenic neoplasm. Bilateral pulmonary metastases. Associated thoracic nodal metastases, including a 10 mm short axis left supraclavicular nodal metastasis which may be amenable to tissue sampling/resection. Destructive lytic osseous metastasis along the anterior aspect of the L3 vertebral body. Electronically Signed   By: Julian Hy M.D.   On:  04/02/2019 12:18   DG Chest Port 1 View  Result Date: 04/01/2019 CLINICAL DATA:  Decreased right upper extremity and lower extremity movement EXAM: PORTABLE CHEST 1 VIEW COMPARISON:  2014 FINDINGS: Chronic interstitial prominence. Patchy perihilar densities. No pleural effusion or pneumothorax. Normal heart size. Increased right paratracheal density. IMPRESSION: Mild patchy perihilar atelectasis/consolidation. Increased  right paratracheal density, which could reflect mediastinal soft tissue. Electronically Signed   By: Macy Mis M.D.   On: 04/01/2019 16:32   VAS [place] LOWER EXTREMITY VENOUS (DVT)  Result Date: 04/21/2019  Lower Venous Study Indications: Swelling.  Comparison Study: 04/08/2019- age indeterminate DVT right femoral, profunda                   femoral, popliteal, posterior tibial, peroneal veins. Performing Technologist: Maudry Mayhew MHA, RDMS, RVT, RDCS  Examination Guidelines: A complete evaluation includes B-mode imaging, spectral Doppler, color Doppler, and power Doppler as needed of all accessible portions of each vessel. Bilateral testing is considered an integral part of a complete examination. Limited examinations for reoccurring indications may be performed as noted.  +---------+---------------+---------+-----------+----------+--------------+ RIGHT    CompressibilityPhasicitySpontaneityPropertiesThrombus Aging +---------+---------------+---------+-----------+----------+--------------+ CFV      Full           Yes      Yes                                 +---------+---------------+---------+-----------+----------+--------------+ SFJ      Full                                                        +---------+---------------+---------+-----------+----------+--------------+ FV Prox  None                                         Acute          +---------+---------------+---------+-----------+----------+--------------+ FV Mid   None                    No                    Acute          +---------+---------------+---------+-----------+----------+--------------+ FV DistalNone                                         Acute          +---------+---------------+---------+-----------+----------+--------------+ PFV      None                                         Acute          +---------+---------------+---------+-----------+----------+--------------+ POP      None           Yes      Yes                  Acute          +---------+---------------+---------+-----------+----------+--------------+ PTV      None                                         Acute          +---------+---------------+---------+-----------+----------+--------------+ PERO     None  Acute          +---------+---------------+---------+-----------+----------+--------------+  +----+---------------+---------+-----------+----------+--------------+ LEFTCompressibilityPhasicitySpontaneityPropertiesThrombus Aging +----+---------------+---------+-----------+----------+--------------+ CFV Full           Yes      Yes                                 +----+---------------+---------+-----------+----------+--------------+  Summary: Right: Findings consistent with acute deep vein thrombosis involving the right femoral vein, right proximal profunda vein, right popliteal vein, right posterior tibial veins, and right peroneal veins. No cystic structure found in the popliteal fossa. Left: No evidence of common femoral vein obstruction. When compared to prior study, right lower extremity veins with thrombus are more dilated during this exam with less echogenic components. This is suggestive of acute changes.  *See table(s) above for measurements and observations. Electronically signed by Monica Martinez MD on 04/21/2019 at 1:49:52 PM.    Final    VAS [place] LOWER EXTREMITY VENOUS (DVT)  Result Date: 04/09/2019  Lower Venous Study  Indications: Swelling, and Pain.  Risk Factors: Surgery left craniotomy tumor on 04/05/19. Comparison Study: No priors. Performing Technologist: Oda Cogan RDMS, RVT  Examination Guidelines: A complete evaluation includes B-mode imaging, spectral Doppler, color Doppler, and power Doppler as needed of all accessible portions of each vessel. Bilateral testing is considered an integral part of a complete examination. Limited examinations for reoccurring indications may be performed as noted.  +---------+---------------+---------+-----------+----------+-----------------+ RIGHT    CompressibilityPhasicitySpontaneityPropertiesThrombus Aging    +---------+---------------+---------+-----------+----------+-----------------+ CFV      Full           Yes      Yes                                    +---------+---------------+---------+-----------+----------+-----------------+ SFJ      Full                                                           +---------+---------------+---------+-----------+----------+-----------------+ FV Prox  None                                         Age Indeterminate +---------+---------------+---------+-----------+----------+-----------------+ FV Mid   None                                         Age Indeterminate +---------+---------------+---------+-----------+----------+-----------------+ FV DistalNone                                         Age Indeterminate +---------+---------------+---------+-----------+----------+-----------------+ PFV      Full                                         Age Indeterminate +---------+---------------+---------+-----------+----------+-----------------+ POP      None           No  No                   Age Indeterminate +---------+---------------+---------+-----------+----------+-----------------+ PTV      None                                         Age Indeterminate  +---------+---------------+---------+-----------+----------+-----------------+ PERO     None                                         Age Indeterminate +---------+---------------+---------+-----------+----------+-----------------+ Gastroc  None                                                           +---------+---------------+---------+-----------+----------+-----------------+ Thrombus appears sub-acute.  +---------+---------------+---------+-----------+----------+--------------+ LEFT     CompressibilityPhasicitySpontaneityPropertiesThrombus Aging +---------+---------------+---------+-----------+----------+--------------+ CFV      Full           Yes      Yes                                 +---------+---------------+---------+-----------+----------+--------------+ SFJ      Full                                                        +---------+---------------+---------+-----------+----------+--------------+ FV Prox  Full                                                        +---------+---------------+---------+-----------+----------+--------------+ FV Mid   Full                                                        +---------+---------------+---------+-----------+----------+--------------+ FV DistalFull                                                        +---------+---------------+---------+-----------+----------+--------------+ PFV      Full                                                        +---------+---------------+---------+-----------+----------+--------------+ POP      Full           Yes      Yes                                 +---------+---------------+---------+-----------+----------+--------------+  PTV      Full                                                        +---------+---------------+---------+-----------+----------+--------------+ PERO     Full                                                         +---------+---------------+---------+-----------+----------+--------------+    Summary: Right: Findings consistent with age indeterminate deep vein thrombosis involving the right femoral vein, right popliteal vein, right posterior tibial veins, right peroneal veins, and right gastrocnemius veins. Left: There is no evidence of deep vein thrombosis in the lower extremity.  *See table(s) above for measurements and observations. Electronically signed by Harold Barban MD on 04/09/2019 at 8:29:58 AM.    Final     ASSESSMENT AND PLAN: 1.  Stage IV non-small cell lung cancer, adenocarcinoma -bone and brain metastases 2.  Right-sided weakness secondary to brain metastases, improved 3.  Right lower extremity DVT 4.  Mild anemia  -Ms. Frier has completed her inpatient rehabilitation.  She will be discharged today with plans for outpatient rehab. -Recommend that she continue on Eliquis for her DVT as an outpatient. -She is scheduled on 04/29/2019 for an MRI of the brain and then follow-up with radiation oncology on 04/30/2019. -We will arrange for outpatient follow-up with medical oncology within the next week.   LOS: 10 days   Mikey Bussing, DNP, AGPCNP-BC, AOCNP 04/23/19

## 2019-04-23 NOTE — Discharge Summary (Signed)
Physician Discharge Summary  Patient ID: [name] MRN: [id number] DOB/AGE: 08-24-1955 63 y.o.  Admit date: 04/13/2019 Discharge date: 04/23/2019  Discharge Diagnoses:  Principal Problem:   Lung cancer metastatic to brain [name] Active Problems:   Cancer, metastatic to bone ([place])   Adenocarcinoma of lung, stage 4, right ([place])   Acute deep vein thrombosis (DVT) of calf muscle vein of right lower extremity (HCC)   Leucocytosis   Hypoalbuminemia due to protein-calorie malnutrition [facility])   Palliative care by specialist   Cancer associated pain   Discharged Condition:  Stable   Significant Diagnostic Studies: VAS [place] LOWER EXTREMITY VENOUS (DVT)  Result Date: 04/21/2019  Lower Venous Study Indications: Swelling.  Comparison Study: 04/08/2019- age indeterminate DVT right femoral, profunda                   femoral, popliteal, posterior tibial, peroneal veins. Performing Technologist: Maudry Mayhew MHA, RDMS, RVT, RDCS  Examination Guidelines: A complete evaluation includes B-mode imaging, spectral Doppler, color Doppler, and power Doppler as needed of all accessible portions of each vessel. Bilateral testing is considered an integral part of a complete examination. Limited examinations for reoccurring indications may be performed as noted.  +---------+---------------+---------+-----------+----------+--------------+ RIGHT    CompressibilityPhasicitySpontaneityPropertiesThrombus Aging +---------+---------------+---------+-----------+----------+--------------+ CFV      Full           Yes      Yes                                 +---------+---------------+---------+-----------+----------+--------------+ SFJ      Full                                                        +---------+---------------+---------+-----------+----------+--------------+ FV Prox  None                                         Acute           +---------+---------------+---------+-----------+----------+--------------+ FV Mid   None                    No                   Acute          +---------+---------------+---------+-----------+----------+--------------+ FV DistalNone                                         Acute          +---------+---------------+---------+-----------+----------+--------------+ PFV      None                                         Acute          +---------+---------------+---------+-----------+----------+--------------+ POP      None           Yes      Yes                  Acute          +---------+---------------+---------+-----------+----------+--------------+  PTV      None                                         Acute          +---------+---------------+---------+-----------+----------+--------------+ PERO     None                                         Acute          +---------+---------------+---------+-----------+----------+--------------+  +----+---------------+---------+-----------+----------+--------------+ LEFTCompressibilityPhasicitySpontaneityPropertiesThrombus Aging +----+---------------+---------+-----------+----------+--------------+ CFV Full           Yes      Yes                                 +----+---------------+---------+-----------+----------+--------------+  Summary: Right: Findings consistent with acute deep vein thrombosis involving the right femoral vein, right proximal profunda vein, right popliteal vein, right posterior tibial veins, and right peroneal veins. No cystic structure found in the popliteal fossa. Left: No evidence of common femoral vein obstruction. When compared to prior study, right lower extremity veins with thrombus are more dilated during this exam with less echogenic components. This is suggestive of acute changes.  *See table(s) above for measurements and observations. Electronically signed by Monica Martinez MD on 04/21/2019  at 1:49:52 PM.    Final    VAS [place] LOWER EXTREMITY VENOUS (DVT)  Result Date: 04/09/2019  Lower Venous Study Indications: Swelling, and Pain.  Risk Factors: Surgery left craniotomy tumor on 04/05/19. Comparison Study: No priors. Performing Technologist: Oda Cogan RDMS, RVT  Examination Guidelines: A complete evaluation includes B-mode imaging, spectral Doppler, color Doppler, and power Doppler as needed of all accessible portions of each vessel. Bilateral testing is considered an integral part of a complete examination. Limited examinations for reoccurring indications may be performed as noted.  +---------+---------------+---------+-----------+----------+-----------------+ RIGHT    CompressibilityPhasicitySpontaneityPropertiesThrombus Aging    +---------+---------------+---------+-----------+----------+-----------------+ CFV      Full           Yes      Yes                                    +---------+---------------+---------+-----------+----------+-----------------+ SFJ      Full                                                           +---------+---------------+---------+-----------+----------+-----------------+ FV Prox  None                                         Age Indeterminate +---------+---------------+---------+-----------+----------+-----------------+ FV Mid   None                                         Age Indeterminate +---------+---------------+---------+-----------+----------+-----------------+ FV DistalNone  Age Indeterminate +---------+---------------+---------+-----------+----------+-----------------+ PFV      Full                                         Age Indeterminate +---------+---------------+---------+-----------+----------+-----------------+ POP      None           No       No                   Age Indeterminate  +---------+---------------+---------+-----------+----------+-----------------+ PTV      None                                         Age Indeterminate +---------+---------------+---------+-----------+----------+-----------------+ PERO     None                                         Age Indeterminate +---------+---------------+---------+-----------+----------+-----------------+ Gastroc  None                                                           +---------+---------------+---------+-----------+----------+-----------------+ Thrombus appears sub-acute.  +---------+---------------+---------+-----------+----------+--------------+ LEFT     CompressibilityPhasicitySpontaneityPropertiesThrombus Aging +---------+---------------+---------+-----------+----------+--------------+ CFV      Full           Yes      Yes                                 +---------+---------------+---------+-----------+----------+--------------+ SFJ      Full                                                        +---------+---------------+---------+-----------+----------+--------------+ FV Prox  Full                                                        +---------+---------------+---------+-----------+----------+--------------+ FV Mid   Full                                                        +---------+---------------+---------+-----------+----------+--------------+ FV DistalFull                                                        +---------+---------------+---------+-----------+----------+--------------+ PFV      Full                                                        +---------+---------------+---------+-----------+----------+--------------+  POP      Full           Yes      Yes                                 +---------+---------------+---------+-----------+----------+--------------+ PTV      Full                                                         +---------+---------------+---------+-----------+----------+--------------+ PERO     Full                                                        +---------+---------------+---------+-----------+----------+--------------+    Summary: Right: Findings consistent with age indeterminate deep vein thrombosis involving the right femoral vein, right popliteal vein, right posterior tibial veins, right peroneal veins, and right gastrocnemius veins. Left: There is no evidence of deep vein thrombosis in the lower extremity.  *See table(s) above for measurements and observations. Electronically signed by Harold Barban MD on 04/09/2019 at 8:29:58 AM.    Final     Labs:  Basic Metabolic Panel: BMP Latest Ref Rng & Units 04/22/2019 04/22/2019 04/15/2019  Glucose 70 - 99 mg/dL 115(H) 78 97  BUN 8 - 23 mg/dL 7(L) 7(L) 10  Creatinine 0.44 - 1.00 mg/dL 0.50 0.51 0.43(L)  Sodium 135 - 145 mmol/L 137 137 137  Potassium 3.5 - 5.1 mmol/L 4.0 4.0 3.9  Chloride 98 - 111 mmol/L 102 100 102  CO2 22 - 32 mmol/L 26 27 25   Calcium 8.9 - 10.3 mg/dL 8.6(L) 8.6(L) 8.4(L)    CBC: CBC Latest Ref Rng & Units 04/23/2019 04/22/2019 04/14/2019  WBC 4.0 - 10.5 K/uL 9.6 10.2 15.6(H)  Hemoglobin 12.0 - 15.0 g/dL 11.6(L) 11.1(L) 13.1  Hematocrit 36.0 - 46.0 % 35.1(L) 33.9(L) 38.6  Platelets 150 - 400 K/uL 387 393 414(H)    CBG: No results for input(s): GLUCAP in the last 168 hours.  Brief HPI:   [name] is a 63 y.o. female with history of HTN, OA who was admitted on 04/01/19 with 2-3 week history of right sided weakness progressing to foot drop, left breast/lateral chest wall pain and difficulty raising RUE.  CT of the head performed revealing left frontal lobe vasogenic edema concerning for metastatic disease.  Neurology recommended MRI brain for work-up and this revealed solitary left frontal lobe mass with moderate vasogenic edema and extensive small vessel disease.  She was started on Keppra and IV Decadron and  transferred to [facility] for further management.  CT chest, abdomen and pelvis revealed 3 x 2.4 x 3.6 spiculated mass in medial RUL compatible with primary bronchogenic neoplasm, additional bilateral pulmonary metastases LUL and BLL, thoracic nodal metastases, left supraclavicular nodal metastases, hepatic cyst and lytic osseous metastases along anterior aspect of L3 vertebral body she underwent left craniotomy with resection of tumor on 12/11 by Dr. Trenton Gammon and pathology positive for adenocarcinoma.  She developed calf pain on 12/13 and BLE Dopplers performed revealing indeterminate DVT involving right femoral, right gastroc and popliteal veins.  Dr. Marin Olp recommended continuing  subcu Lovenox and transition to NOAC on outpatient basis.  Patient to be presented to tumor board with plans of XRT in future.  Patient continued to be limited by right-sided weakness with instability as well as calf pain affecting mobility and ADLs.  CIR was recommended due to functional decline.   Hospital Course: Adaliz Dobis was admitted to rehab 04/13/2019 for inpatient therapies to consist of PT, ST and OT at least three hours five days a week. Past admission physiatrist, therapy team and rehab RN have worked together to provide customized collaborative inpatient rehab. Ensure supplements were added due to need for increased nutritional needs.  Marinol was added to help stimulate appetite.  Follow-up check of BMET showed lytes and renal status to be within normal limits.  Follow-up CBC showed ABLA and leukocytosis was resolving. Blood pressures were monitored on TID basis and have been reasonably stable.  Cranial incision is clean and dry and healing well.  She continues to have limitations with pain in right lower extremity.  Baclofen was increased to 5 mg 3 times daily to help with spasticity.  Duragesic patch was added for more consistent pain relief and titrated to 25 mcg at discharge.  Follow-up Doppler showed acute  DVT in RLE and she was transitioned to Eliquis treatment dose at discharge.  Palliative care was consulted to establish goals of care and psychosocial support.  Patient has elected on full scope of care and is hopeful for continued quality of life.  She is to follow-up with radiation oncology next week for evaluation and to initiate XRT. She is continent of bowel and bladder. She has made gains during rehab stay and is at supervision level.  She will continue to receive follow up outpatient PT and OT at [facility] neuro rehab after discharge  Rehab course: During patient's stay in rehab weekly team conferences were held to monitor patient's progress, set goals and discuss barriers to discharge. At admission, patient required mod assist with basic ADLs and with mobility. She  has had improvement in activity tolerance, balance, postural control as well as ability to compensate for deficits.  She has had improvement in right upper extremity and right lower extremity strength with improvement in coordination.  She is able to complete ADL tasks with supervision.  She requires supervision for transfers and to ambulate 150 feet with rolling walker and verbal cues for weight shifting on the right.  Family education was completed with husband regarding all aspects of safety and care.Marland Kitchen   Discharge disposition: 01-Home or Self Care  Diet: Regular  Special Instructions: 1.  No driving or strenuous activity till cleared by MD. 2.  Reschedule outpatient therapy for week after next.  Discharge Instructions    Ambulatory referral to Physical Medicine Rehab   Complete by: As directed    1-2 weeks transitional care appt. She will be starting XRT next week     Allergies as of 04/23/2019      Reactions   Codeine    Lisinopril-hydrochlorothiazide Other (See Comments)   Back pain      Medication List    STOP taking these medications   amLODipine 10 MG tablet Commonly known as: NORVASC   aspirin 81 MG tablet    dexamethasone 1 MG tablet Commonly known as: DECADRON   dexamethasone 2 MG tablet Commonly known as: DECADRON   enoxaparin 60 MG/0.6ML injection Commonly known as: LOVENOX   hydrALAZINE 25 MG tablet Commonly known as: APRESOLINE   HYDROcodone-acetaminophen 5-325 MG tablet Commonly  known as: NORCO/VICODIN   ibuprofen 800 MG tablet Commonly known as: ADVIL   ketorolac 10 MG tablet Commonly known as: TORADOL   ondansetron 4 MG tablet Commonly known as: ZOFRAN   ondansetron 4 MG/2ML Soln injection Commonly known as: ZOFRAN     TAKE these medications   acetaminophen 325 MG tablet Commonly known as: TYLENOL Take 2 tablets (650 mg total) by mouth every 6 (six) hours. What changed:   when to take this  reasons to take this  Another medication with the same name was removed. Continue taking this medication, and follow the directions you see here.   apixaban 5 MG Tabs tablet Commonly known as: ELIQUIS Take 2 tablets (10 mg total) by mouth 2 (two) times daily. Notes to patient: Use starter pack --2 pills twice a day till gone. Then decrease to one pill twice a day.    apixaban 5 MG Tabs tablet Commonly known as: ELIQUIS Take 1 tablet (5 mg total) by mouth 2 (two) times daily. After starter pack is used up Start taking on: April 29, 2019   Baclofen 5 MG Tabs Take 5 mg by mouth 3 (three) times daily.   diphenhydrAMINE 25 mg capsule Commonly known as: BENADRYL Take 1 capsule (25 mg total) by mouth every 8 (eight) hours as needed for itching.   dronabinol 5 MG capsule Commonly known as: MARINOL Take 1 capsule (5 mg total) by mouth 2 (two) times daily before lunch and supper.   feeding supplement (ENSURE ENLIVE) Liqd Take 237 mLs by mouth 2 (two) times daily between meals. Notes to patient: Premier protein/greek yogurt/ unsalted nuts, etc   fentaNYL 25 MCG/HR Commonly known as: [place] 1 patch onto the skin every 3 (three) days for 7 days. Notes to  patient: Was applied on 12/29 am--next change on Friday am   gabapentin 300 MG capsule Commonly known as: NEURONTIN Take 1 capsule (300 mg total) by mouth 3 (three) times daily.   levETIRAcetam 500 MG tablet Commonly known as: KEPPRA Take 1 tablet (500 mg total) by mouth 2 (two) times daily.   methocarbamol 500 MG tablet Commonly known as: ROBAXIN Take 1 tablet (500 mg total) by mouth every 6 (six) hours as needed for muscle spasms.   nicotine 7 mg/24hr patch Commonly known as: NICODERM CQ - dosed in mg/24 hr Place 1 patch (7 mg total) onto the skin daily.   Oxycodone HCl 10 MG Tabs--Rx# 150 pills Take 1 tablet (10 mg total) by mouth every 3 (three) hours as needed for severe pain.   pantoprazole 40 MG tablet Commonly known as: PROTONIX Take 1 tablet (40 mg total) by mouth daily with supper.   promethazine 12.5 MG tablet Commonly known as: PHENERGAN Take 1-2 tablets (12.5-25 mg total) by mouth every 4 (four) hours as needed for refractory nausea / vomiting.   senna-docusate 8.6-50 MG tablet Commonly known as: Senokot-S Take 1 tablet by mouth 2 (two) times daily.   traZODone 50 MG tablet Commonly known as: DESYREL Take 0.5-1 tablets (25-50 mg total) by mouth at bedtime as needed for sleep.      Follow-up Information    Volanda Napoleon, MD Follow up.   Specialty: Oncology Contact information: [address] [place] [address] [phone]        Tyler Pita, MD Follow up on 04/30/2019.   Specialty: Radiation Oncology Why: Appointment at 9 am--to see nurse/MD Contact information: [address] [place] [address] [phone]  Izora Ribas, MD Follow up.   Specialty: Physical Medicine and Rehabilitation Why: Office will call you with follow up appointment Contact information: [address] [address] [phone]        [name], MD .   Specialty: Obstetrics and  Gynecology Contact information: [address] [place] [address] [phone]        Pixie Casino, MD .   Specialty: Cardiology Contact information: [address] [place] [address] [phone]           Signed: Bary Leriche 04/23/2019, 11:19 AM

## 2019-04-23 NOTE — Progress Notes (Signed)
[facility] PHYSICAL MEDICINE & REHABILITATION PROGRESS NOTE  Subjective/Complaints: No new issues overnight. Pain still controlled. No issues with fentanyl  ROS: Patient denies fever, rash, sore throat, blurred vision, nausea, vomiting, diarrhea, cough, shortness of breath or chest pain,  headache, or mood change.    Objective: Vital Signs: Blood pressure 124/79, pulse 73, temperature 98.2 F (36.8 C), temperature source Oral, resp. rate 18, height 5\' 7"  (1.702 m), weight 59.3 kg, SpO2 95 %. VAS [place] LOWER EXTREMITY VENOUS (DVT)  Result Date: 04/21/2019  Lower Venous Study Indications: Swelling.  Comparison Study: 04/08/2019- age indeterminate DVT right femoral, profunda                   femoral, popliteal, posterior tibial, peroneal veins. Performing Technologist: Maudry Mayhew MHA, RDMS, RVT, RDCS  Examination Guidelines: A complete evaluation includes B-mode imaging, spectral Doppler, color Doppler, and power Doppler as needed of all accessible portions of each vessel. Bilateral testing is considered an integral part of a complete examination. Limited examinations for reoccurring indications may be performed as noted.  +---------+---------------+---------+-----------+----------+--------------+ RIGHT    CompressibilityPhasicitySpontaneityPropertiesThrombus Aging +---------+---------------+---------+-----------+----------+--------------+ CFV      Full           Yes      Yes                                 +---------+---------------+---------+-----------+----------+--------------+ SFJ      Full                                                        +---------+---------------+---------+-----------+----------+--------------+ FV Prox  None                                         Acute          +---------+---------------+---------+-----------+----------+--------------+ FV Mid   None                    No                   Acute           +---------+---------------+---------+-----------+----------+--------------+ FV DistalNone                                         Acute          +---------+---------------+---------+-----------+----------+--------------+ PFV      None                                         Acute          +---------+---------------+---------+-----------+----------+--------------+ POP      None           Yes      Yes                  Acute          +---------+---------------+---------+-----------+----------+--------------+ PTV      None  Acute          +---------+---------------+---------+-----------+----------+--------------+ PERO     None                                         Acute          +---------+---------------+---------+-----------+----------+--------------+  +----+---------------+---------+-----------+----------+--------------+ LEFTCompressibilityPhasicitySpontaneityPropertiesThrombus Aging +----+---------------+---------+-----------+----------+--------------+ CFV Full           Yes      Yes                                 +----+---------------+---------+-----------+----------+--------------+  Summary: Right: Findings consistent with acute deep vein thrombosis involving the right femoral vein, right proximal profunda vein, right popliteal vein, right posterior tibial veins, and right peroneal veins. No cystic structure found in the popliteal fossa. Left: No evidence of common femoral vein obstruction. When compared to prior study, right lower extremity veins with thrombus are more dilated during this exam with less echogenic components. This is suggestive of acute changes.  *See table(s) above for measurements and observations. Electronically signed by Monica Martinez MD on 04/21/2019 at 1:49:52 PM.    Final    Recent Labs    04/22/19 1048 04/23/19 0559  WBC 10.2 9.6  HGB 11.1* 11.6*  HCT 33.9* 35.1*  PLT 393 387   Recent  Labs    04/22/19 0835 04/22/19 1048  NA 137 137  K 4.0 4.0  CL 100 102  CO2 27 26  GLUCOSE 78 115*  BUN 7* 7*  CREATININE 0.51 0.50  CALCIUM 8.6* 8.6*    Physical Exam: BP 124/79 (BP Location: Left Arm)   Pulse 73   Temp 98.2 F (36.8 C) (Oral)   Resp 18   Ht 5\' 7"  (1.702 m)   Wt 59.3 kg   SpO2 95%   BMI 20.48 kg/m  Constitutional: No distress . Vital signs reviewed. HEENT: EOMI, oral membranes moist Neck: supple Cardiovascular: RRR without murmur. No JVD    Respiratory: CTA Bilaterally without wheezes or rales. Normal effort    GI: BS +, non-tender, non-distended  Skin: Warm and dry.  Intact. Psych: Normal mood.  Normal behavior. cooperative Musc: Right lower extremity with mild tenderness and 1+ edema unchanged Neurological:  Alert Motor: Left upper extremity: 5/5 proximal distal Right upper extremity: Proximally 0-15, handgrip 4/5 with apraxia still preesent Left lower extremity: Hip flexion, knee extension 4 -/5, ankle dorsiflexion 4/5 Right lower extremity: Hip flexion, knee extension 3/5, ankle dorsiflexion 4/5 Lumbar back: Tenderness to palpation over upper lumbar and lower thoracic spine ongoing ---no changes in back exam  Assessment/Plan: 1. Functional deficits secondary to brain and spine metastatic disease which require 3+ hours per day of interdisciplinary therapy in a comprehensive inpatient rehab setting.  Physiatrist is providing close team supervision and 24 hour management of active medical problems listed below.  Physiatrist and rehab team continue to assess barriers to discharge/monitor patient progress toward functional and medical goals  Care Tool:  Bathing    Body parts bathed by patient: Chest, Abdomen, Front perineal area, Right upper leg, Buttocks, Left upper leg, Right lower leg, Left lower leg, Face, Right arm, Left arm   Body parts bathed by helper: Right arm, Left arm     Bathing assist Assist Level: Supervision/Verbal cueing      Upper Body Dressing/Undressing Upper body dressing  What is the patient wearing?: Pull over shirt    Upper body assist Assist Level: Supervision/Verbal cueing    Lower Body Dressing/Undressing Lower body dressing      What is the patient wearing?: Pants, Underwear/pull up     Lower body assist Assist for lower body dressing: Supervision/Verbal cueing     Toileting Toileting Toileting Activity did not occur (Clothing management and hygiene only): N/A (no void or bm)  Toileting assist Assist for toileting: Supervision/Verbal cueing     Transfers Chair/bed transfer  Transfers assist     Chair/bed transfer assist level: Supervision/Verbal cueing     Locomotion Ambulation   Ambulation assist      Assist level: Supervision/Verbal cueing Assistive device: Walker-rolling Max distance: 150 ft   Walk 10 feet activity   Assist     Assist level: Supervision/Verbal cueing Assistive device: Walker-rolling, Orthosis   Walk 50 feet activity   Assist Walk 50 feet with 2 turns activity did not occur: Safety/medical concerns(Decreased strength/activity tolerance)  Assist level: Supervision/Verbal cueing Assistive device: Walker-rolling, Orthosis    Walk 150 feet activity   Assist Walk 150 feet activity did not occur: Safety/medical concerns(Decreased strength/activity tolerance)  Assist level: Supervision/Verbal cueing Assistive device: Walker-rolling, Orthosis    Walk 10 feet on uneven surface  activity   Assist Walk 10 feet on uneven surfaces activity did not occur: Safety/medical concerns(Decreased strength/activity tolerance)   Assist level: Supervision/Verbal cueing Assistive device: Walker-rolling, Orthosis   Wheelchair     Assist Will patient use wheelchair at discharge?: No Type of Wheelchair: Manual Wheelchair activity did not occur: N/A(pt ambulatory at household level)  Wheelchair assist level: Supervision/Verbal cueing Max  wheelchair distance: 50 ft    Wheelchair 50 feet with 2 turns activity    Assist    Wheelchair 50 feet with 2 turns activity did not occur: N/A(pt ambulatory at household level)   Assist Level: Supervision/Verbal cueing   Wheelchair 150 feet activity     Assist Wheelchair 150 feet activity did not occur: N/A(pt ambulatory at household level)          Medical Problem List and Plan: 1.  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   Dc home today  -Patient to see Raulkar in the office for transitional care encounter in 1-2 weeks.  2.  Antithrombotics: -RLE DVT/anticoagulation: increased size of clots/veins on doppler---changed to eliquis for rx on 12/28             -antiplatelet therapy: N/A 3. Pain Management: Norco prn             Monitor with increased mobility  12/22: In significant pain this morning, present in her back. Added Norco10mg  for severe pain for better pain control and to allow her to participate in therapy.   12/23: Mrs. Shamblin's pain is better controlled but she is asking for pain medication every 3.5 hours. Pain is present in her lower thoracic/upper lumbar spine and has a throbbing quality. The pain in her legs is much improved. She appears uncomfortable. She has been performing better in therapy after increasing her pain medication. Will increase frequency to [facility]  12/24: Mrs. Labrosse's pain is better controlled today after palliative care scheduled her tylenol and gabapentin yesterday. Spoke with palliative care today, and they also recommended adding long-acting pain control as well. I have added Oxycontin 10mg  q12H.   12/25: Since she is still requesting IR oxy prior to next dose,  I will increased Oxycontin to 15mg  BID  12/26: Pain now tolerable, but when asked if still severe at times, she says yes. Will increase Oxycontin to 20mg  BID. Discussed the possibility of a Fentanyl patch for better pain  control. Currently tolerating increased Oxy doses well.   12/27: Continuing to ask for breakthrough oxycodone every 3-4 hours. Pain is still always present, not worse at particular time of day, 6 or 7 in intensity. Discussed transitioning to Fentanyl patch for better pain control and patient is agreeable. Spoke with Corene Cornea from pharmacy regarding transition. Was previously on approximately 180 morphine equivalents with Oxycontin/Oxycodone q3H and will be transitioned to 120 morphine equivalents with Fentanyl patch 5mcg/Oxycodone q4H.    12/28: pain seems much better controlled today. consider titrating to fentanyl 34mcg patch at discharge   12/29: pain under control at present. Will increase fentanyl to 66mcg today for consistent coverage at home and considering her dx. Continue oxycodone prn 4. Mood: LCSW to follow for evaluation              -antipsychotic agents: N/A 5. Neuropsych: This patient is capable of making decisions on her own behalf. 6. Skin/Wound Care: Routine pressure relief measures 7. Fluids/Electrolytes/Nutrition: Ensure Enlive po BID for increased nutritional needs due to cancer/hypoalbuminemia, appreciate dietary input.  CMP within acceptable range 12/28 8. Stage 4 adenocarcinoma of the lung with metastases to the brain and spine:             -Post-op brain radiation to start on Jan 4th.             -Specimen sent for genetic analysis, results should take 7-10 days             Palliative care consulted; input appreciated.  9. Spasticity:   Baclofen 5mg  TID for increased tone in right lower extremity, tolerating at present. Consider further titration as outpt.  10.  Leukocytosis-likely related to CA  WBC's 9.6 12/29 11. Constipation: Will DC Senna-Docusate since now having regular BM  12/27: Given increase in opioids and likely opioid induced constipation, resumed senna-docusate BID.  12/29 bm this morning      LOS: 10 days A FACE TO FACE EVALUATION WAS  PERFORMED  Meredith Staggers 04/23/2019, 9:22 AM

## 2019-04-23 NOTE — Plan of Care (Signed)
Pt to discharge home with husband

## 2019-04-23 NOTE — Progress Notes (Signed)
Social Work Discharge Note   The overall goal for the admission was met for:   Discharge location: Yes - home with spouse who will provide 24/7 support  Length of Stay: Yes - 10 days  Discharge activity level: Yes - supervision overall  Home/community participation: Yes  Services provided included: MD, RD, PT, OT, RN, CM, TR, Pharmacy and SW  Financial Services: Private Insurance: [employer]  Follow-up services arranged: Outpatient: PT, OT, ST via [facility], DME: transport w/c, walker, 3n1 via [place] and Patient/Family has no preference for HH/DME agencies  Comments (or additional information):   contact person- spouse, Kindall Swaby @ [phone]  Patient/Family verbalized understanding of follow-up arrangements: Yes  Individual responsible for coordination of the follow-up plan: pt  Confirmed correct DME delivered: Davis Vannatter 04/23/2019    Asianna Brundage

## 2019-04-24 ENCOUNTER — Other Ambulatory Visit: Payer: Self-pay | Admitting: Hematology & Oncology

## 2019-04-24 ENCOUNTER — Ambulatory Visit: Payer: BC Managed Care – PPO | Admitting: Physical Therapy

## 2019-04-24 ENCOUNTER — Encounter: Payer: Self-pay | Admitting: *Deleted

## 2019-04-24 ENCOUNTER — Ambulatory Visit: Payer: BC Managed Care – PPO | Admitting: Occupational Therapy

## 2019-04-24 ENCOUNTER — Encounter: Payer: Self-pay | Admitting: Hematology & Oncology

## 2019-04-24 DIAGNOSIS — I82461 Acute embolism and thrombosis of right calf muscular vein: Secondary | ICD-10-CM

## 2019-04-24 DIAGNOSIS — C3491 Malignant neoplasm of unspecified part of right bronchus or lung: Secondary | ICD-10-CM

## 2019-04-24 DIAGNOSIS — Z7189 Other specified counseling: Secondary | ICD-10-CM

## 2019-04-24 HISTORY — DX: Other specified counseling: Z71.89

## 2019-04-24 NOTE — Progress Notes (Signed)
Reached out to [name] to introduce myself as the office RN Navigator and explain our new patient process. Spoke with patient's husband, [name]. Patient was seen in the hospital by Dr Marin Olp and needs to establish care here in the office. Scheduled their new patient appointment along with labs. Provided address and directions to the office including call back phone number. Reviewed with patient any concerns they may have or any possible barriers to attending their appointment.   Also gave husband information for the patient's schedule PET scan. Appointment time, date and location in addition to all PET prep information.   Informed patient about my role as a navigator and that I will meet with them prior to their New Patient appointment and more fully discuss what services I can provide. At this time patient has no further questions or needs.

## 2019-04-25 ENCOUNTER — Telehealth: Payer: Self-pay | Admitting: Registered Nurse

## 2019-04-25 ENCOUNTER — Telehealth: Payer: Self-pay | Admitting: Physical Medicine and Rehabilitation

## 2019-04-25 MED ORDER — FENTANYL 25 MCG/HR TD PT72: 1.0000 | MEDICATED_PATCH | TRANSDERMAL | 0 refills | Status: DC

## 2019-04-25 NOTE — Telephone Encounter (Signed)
PMP was Reviewed; Reesa Chew PA having difficulty with e-scribing.

## 2019-04-25 NOTE — Telephone Encounter (Signed)
Received prior authorization for fentanyl. Will attempt to send it to [facility]. IF unable will have husband come to pick up Rx.

## 2019-04-25 NOTE — Telephone Encounter (Signed)
Ms. Broyles Fentanyl e-scribed today: PMP was reviewed. Reesa Chew Pa was having problems with e-scribing.

## 2019-04-29 ENCOUNTER — Ambulatory Visit (HOSPITAL_COMMUNITY)
Admit: 2019-04-29 | Discharge: 2019-04-29 | Disposition: A | Discharge status: Home / Self Care | Payer: BC Managed Care – PPO | Attending: Radiation Oncology | Admitting: Radiation Oncology

## 2019-04-29 ENCOUNTER — Other Ambulatory Visit: Payer: Self-pay

## 2019-04-29 ENCOUNTER — Telehealth: Payer: Self-pay | Admitting: *Deleted

## 2019-04-29 ENCOUNTER — Encounter (HOSPITAL_COMMUNITY): Payer: Self-pay

## 2019-04-29 ENCOUNTER — Other Ambulatory Visit: Payer: Self-pay | Admitting: Physical Medicine and Rehabilitation

## 2019-04-29 DIAGNOSIS — C3491 Malignant neoplasm of unspecified part of right bronchus or lung: Secondary | ICD-10-CM | POA: Diagnosis not present

## 2019-04-29 DIAGNOSIS — G893 Neoplasm related pain (acute) (chronic): Secondary | ICD-10-CM

## 2019-04-29 DIAGNOSIS — C7949 Secondary malignant neoplasm of other parts of nervous system: Secondary | ICD-10-CM

## 2019-04-29 DIAGNOSIS — C7931 Secondary malignant neoplasm of brain: Secondary | ICD-10-CM

## 2019-04-29 MED ORDER — FENTANYL 50 MCG/HR TD PT72: 1.0000 | MEDICATED_PATCH | TRANSDERMAL | 0 refills | Status: DC

## 2019-04-29 NOTE — Progress Notes (Signed)
Pt came today for an MRI appointment for a presurgical scan with SRS protocol.  Pt stated she was very uncomfortable and asked to be sedated for her scan.  We told them it was not arranged with [place] ahead of time and unless her doctor gave her a script for something to bring in with her that it would not be possible today.  We told her we would try to get her as comfortable as we could on the table.  Pt was placed on the table and as soon as the first scout images were in progress she tripped the alarm and when asked she stated she could not carry through with the scan at this point, she was just too uncomfortable.  I tried to call the office and chose the selection for the doctor's line and there was no answer, so I called back to call the front desk and was taken to voicemail.  I left a message but so far have not received a response.  So this message is to inform you that this patient could not tolerate the scan due to her upper back pain (states she has a tumor that is compressing a nerve) and it's very uncomfortable.  Any questions, please call the dept [phone] or our scheduling line at [phone], thanks so much

## 2019-04-29 NOTE — Telephone Encounter (Signed)
Received a call from patients husband [name] stating that patient has been in extreme pain since she left the hospital.  Husband had called another physician ( he wasn't sure of name) who increased Fentanyl to 50 mcg.  Dr Marin Olp ok with this.  Made referral to radiation Oncology.  Spoke with Dr. Sondra Come.

## 2019-04-30 ENCOUNTER — Ambulatory Visit
Admission: RE | Admit: 2019-04-30 | Discharge: 2019-04-30 | Disposition: A | Discharge status: Home / Self Care | Payer: BC Managed Care – PPO | Source: Ambulatory Visit | Attending: Radiation Oncology | Admitting: Radiation Oncology

## 2019-04-30 ENCOUNTER — Ambulatory Visit
Admission: RE | Admit: 2019-04-30 | Payer: BC Managed Care – PPO | Source: Ambulatory Visit | Admitting: Radiation Oncology

## 2019-04-30 ENCOUNTER — Other Ambulatory Visit: Payer: Self-pay

## 2019-04-30 ENCOUNTER — Ambulatory Visit (HOSPITAL_COMMUNITY): Payer: BC Managed Care – PPO

## 2019-04-30 ENCOUNTER — Encounter: Payer: Self-pay | Admitting: Radiation Oncology

## 2019-04-30 ENCOUNTER — Ambulatory Visit
Admit: 2019-04-30 | Discharge: 2019-04-30 | Disposition: A | Discharge status: Home / Self Care | Payer: BC Managed Care – PPO | Attending: Radiation Oncology | Admitting: Radiation Oncology

## 2019-04-30 ENCOUNTER — Other Ambulatory Visit: Payer: Self-pay | Admitting: Urology

## 2019-04-30 VITALS — BP 114/71 | HR 84 | Temp 97.8°F | Resp 18 | Ht 67.0 in | Wt 134.0 lb

## 2019-04-30 DIAGNOSIS — M1990 Unspecified osteoarthritis, unspecified site: Secondary | ICD-10-CM | POA: Insufficient documentation

## 2019-04-30 DIAGNOSIS — C7949 Secondary malignant neoplasm of other parts of nervous system: Secondary | ICD-10-CM

## 2019-04-30 DIAGNOSIS — Z806 Family history of leukemia: Secondary | ICD-10-CM | POA: Diagnosis not present

## 2019-04-30 DIAGNOSIS — C3411 Malignant neoplasm of upper lobe, right bronchus or lung: Secondary | ICD-10-CM | POA: Diagnosis not present

## 2019-04-30 DIAGNOSIS — Z79899 Other long term (current) drug therapy: Secondary | ICD-10-CM | POA: Diagnosis not present

## 2019-04-30 DIAGNOSIS — G893 Neoplasm related pain (acute) (chronic): Secondary | ICD-10-CM

## 2019-04-30 DIAGNOSIS — C3490 Malignant neoplasm of unspecified part of unspecified bronchus or lung: Secondary | ICD-10-CM | POA: Insufficient documentation

## 2019-04-30 DIAGNOSIS — Z86718 Personal history of other venous thrombosis and embolism: Secondary | ICD-10-CM | POA: Insufficient documentation

## 2019-04-30 DIAGNOSIS — C7951 Secondary malignant neoplasm of bone: Secondary | ICD-10-CM

## 2019-04-30 DIAGNOSIS — C7931 Secondary malignant neoplasm of brain: Secondary | ICD-10-CM

## 2019-04-30 DIAGNOSIS — F17210 Nicotine dependence, cigarettes, uncomplicated: Secondary | ICD-10-CM | POA: Diagnosis not present

## 2019-04-30 DIAGNOSIS — G9389 Other specified disorders of brain: Secondary | ICD-10-CM | POA: Insufficient documentation

## 2019-04-30 DIAGNOSIS — I10 Essential (primary) hypertension: Secondary | ICD-10-CM | POA: Diagnosis not present

## 2019-04-30 DIAGNOSIS — C7952 Secondary malignant neoplasm of bone marrow: Secondary | ICD-10-CM

## 2019-04-30 DIAGNOSIS — C3491 Malignant neoplasm of unspecified part of right bronchus or lung: Secondary | ICD-10-CM

## 2019-04-30 MED ORDER — LORAZEPAM 1 MG PO TABS: 1.0000 mg | ORAL_TABLET | ORAL | 0 refills | Status: DC | PRN

## 2019-04-30 MED ORDER — MORPHINE SULFATE (PF) 4 MG/ML IV SOLN
2.0000 mg | Freq: Once | INTRAVENOUS | Status: DC
  Filled 2019-04-30: qty 0.5

## 2019-04-30 MED ORDER — MORPHINE SULFATE 4 MG/ML IJ SOLN
4.0000 mg | INTRAMUSCULAR | Status: AC
  Administered 2019-04-30: 4 mg via INTRAMUSCULAR
  Filled 2019-04-30: qty 1

## 2019-04-30 MED ORDER — LORAZEPAM 1 MG PO TABS
1.0000 mg | ORAL_TABLET | Freq: Once | ORAL | Status: DC
  Filled 2019-04-30: qty 1

## 2019-04-30 MED ORDER — MORPHINE SULFATE 4 MG/ML IJ SOLN
2.0000 mg | INTRAMUSCULAR | Status: AC
  Administered 2019-04-30: 2 mg via INTRAMUSCULAR
  Filled 2019-04-30: qty 1

## 2019-04-30 MED ORDER — LORAZEPAM 1 MG PO TABS
1.0000 mg | ORAL_TABLET | ORAL | Status: AC
  Administered 2019-04-30: 1 mg via ORAL
  Filled 2019-04-30: qty 1

## 2019-04-30 NOTE — Progress Notes (Signed)
Location/Histology of Brain Tumor: Stage IV non-small cell lung cancer, adenocarcinoma -bone and brain metastases  Patient presented with symptoms of:  recently presented to the ED at [facility] due to progressive right sided weakness in the upper and lower extremities ongoing for the past week as well as severe left-sided chest wall pain.  Past or anticipated interventions, if any, per neurosurgery: stereotactic guided craniotomy and resection of the posterior frontal brain tumor for both therapeutic and diagnostic purposes. This procedure was performed on 04/05/2019, and pathology confirmed adenocarcinoma consistent with metastatic lung adenocarcinoma  Past or anticipated interventions, if any, per medical oncology: referred to Dr. Tammi Klippel  Dose of Decadron, if applicable: none presently  Recent neurologic symptoms, if any:   Seizures: no, Keppra 500 bid  Headaches: no  Nausea: yes, managed with phenergan  Dizziness/ataxia: yes when standing  Difficulty with hand coordination: Left hand coordination WNL. Right hand weak and lacking coordination. Scheduled to begin OT/PT outpatient on 05/09/2018  Focal numbness/weakness: yes, right hand  Visual deficits/changes: no  Confusion/Memory deficits: no  Painful bone metastases at present, if any: yes  SAFETY ISSUES:  Prior radiation? no  Pacemaker/ICD? no  Possible current pregnancy? no  Is the patient on methotrexate? no  Additional Complaints / other details: 64 year old female. Married to [employer].

## 2019-04-30 NOTE — Progress Notes (Signed)
Opened in error

## 2019-04-30 NOTE — Progress Notes (Signed)
See progress note under physician encounter. 

## 2019-04-30 NOTE — Progress Notes (Signed)
Radiation Oncology         [phone] ________________________________  Outpatient Re-Evaluation  Name: [name] MRN: [id number]  Date of Service: 04/30/2019 DOB: 1955/04/29  CC:[name], MD  [name], MD   REFERRING PHYSICIAN: [name], MD  DIAGNOSIS: The primary encounter diagnosis was Secondary malignant neoplasm of bone and bone marrow ([place]). Diagnoses of Lung cancer metastatic to brain [facility]) and Metastatic cancer to brain [facility]) were also pertinent to this visit.    ICD-10-CM   1. Secondary malignant neoplasm of bone and bone marrow (H[facility])  C79.51 morphine 4 MG/ML injection 4 mg   C79.52   2. Lung cancer metastatic to brain ([place])  C34.90 LORazepam (ATIVAN) tablet 1 mg   C79.31 morphine 4 MG/ML injection 2 mg    morphine 4 MG/ML injection 4 mg  3. Metastatic cancer to brain [facility])  C79.31     HISTORY OF PRESENT ILLNESS: [name] is a 64 y.o. female seen at the request of Dr. Annette Stable.  She recently presented to the ED at [facility] due to progressive right sided weakness in the upper and lower extremities ongoing for the past week as well as severe left-sided chest wall pain.  A CT head on admission showed a large area of vasogenic edema in the left frontal lobe, highly suspicious for neoplasm, likely metastatic disease.  She was started on dexamethasone 4 mg p.o. every 6 hours to help manage the vasogenic edema and she reports noticing some improvement in the right sided weakness since starting this medication.  A CT C/A/P was performed on 04/02/19 for further evaluation and potential identification of a primary lesion and this did show a 3.6 cm spiculated mass in the medial right upper lobe, compatible with primary bronchogenic neoplasm.  Additionally, there were multiple bilateral pulmonary nodules, associated thoracic nodal metastases including a 10 mm left supraclavicular node which may be an amenable to biopsy/tissue sampling, and destructive  lytic osseous metastasis along the anterior aspect of the L3 vertebral body, as well as lesions in the transverse process of T5 and a left proximal rib.  An MRI of the brain with and without contrast was performed today and confirms a solitary 1.2 cm high left posterior frontal enhancing mass along the cortex with associated moderate vasogenic edema.  Neurosurgery was consulted and Dr. Annette Stable met with the patient on 04/02/2019 to discuss treatment options and the current plan is to proceed with a stereotactic guided craniotomy and resection of the posterior frontal brain tumor for both therapeutic and diagnostic purposes.  This procedure was performed on 04/05/2019, and pathology confirmed adenocarcinoma consistent with metastatic lung adenocarcinoma.  Postsurgical brain MRI performed on 04/06/2019 showed: unchanged degree of surrounding hyperintense T2-weighted signal; no residual contrast enhancement.   While in the hospital, she reported severe left shoulder pain. She proceeded to bone scan on 04/09/2019, which showed: focal areas of increased activity noted over multiple ribs bilaterally, L3 vertebral body, and left acetabular region, consistent with metastatic disease; no focal abnormalities noted about the left shoulder.  On 04/13/2019, she was transferred from the hospital into rehabilitation. Unfortunately, since discharge from rehab on 04/23/2019, she has continued to experience severe pain. This caused her to be unable to tolerate repeat brain MRI on 04/29/2019. She is scheduled for PET scan on 05/02/2019.   PREVIOUS RADIATION THERAPY: No  PAST MEDICAL HISTORY:  Past Medical History:  Diagnosis Date  . Bronchitis   . Goals of care, counseling/discussion 04/24/2019  .  Hypertension   . Osteoarthritis of metacarpophalangeal (MCP) joint of left thumb   . Pelvic ring fracture ([place]) 09/2016   left      PAST SURGICAL HISTORY: Past Surgical History:  Procedure Laterality Date  . APPLICATION OF  CRANIAL NAVIGATION Left 04/05/2019   Procedure: APPLICATION OF CRANIAL NAVIGATION;  Surgeon: Earnie Larsson, MD;  Location: [place];  Service: Neurosurgery;  Laterality: Left;  . CRANIOTOMY Left 04/05/2019   Procedure: LEFT FRONTAL CRANIOTOMY TUMOR EXCISION w/Brain Lab;  Surgeon: Earnie Larsson, MD;  Location: [place];  Service: Neurosurgery;  Laterality: Left;  LEFT FRONTAL CRANIOTOMY TUMOR EXCISION w/Brain Lab    FAMILY HISTORY:  Family History  Problem Relation Age of Onset  . CAD Father        CABG  . Stroke Father   . Cancer Brother        lymphoma    SOCIAL HISTORY:  Social History   Socioeconomic History  . Marital status: Married    Spouse name: Not on file  . Number of children: Not on file  . Years of education: Not on file  . Highest education level: Not on file  Occupational History  . Not on file  Tobacco Use  . Smoking status: Current Every Day Smoker    Packs/day: 0.50    Years: 25.00    Pack years: 12.50    Types: Cigarettes  . Smokeless tobacco: Never Used  Substance and Sexual Activity  . Alcohol use: No    Alcohol/week: 0.0 standard drinks  . Drug use: No  . Sexual activity: Not Currently    Birth control/protection: Post-menopausal  Other Topics Concern  . Not on file  Social History Narrative  . Not on file   Social Determinants of Health   Financial Resource Strain:   . Difficulty of Paying Living Expenses: Not on file  Food Insecurity:   . Worried About Charity fundraiser in the Last Year: Not on file  . Ran Out of Food in the Last Year: Not on file  Transportation Needs:   . Lack of Transportation (Medical): Not on file  . Lack of Transportation (Non-Medical): Not on file  Physical Activity:   . Days of Exercise per Week: Not on file  . Minutes of Exercise per Session: Not on file  Stress:   . Feeling of Stress : Not on file  Social Connections:   . Frequency of Communication with Friends and Family: Not on file  . Frequency of Social  Gatherings with Friends and Family: Not on file  . Attends Religious Services: Not on file  . Active Member of Clubs or Organizations: Not on file  . Attends Archivist Meetings: Not on file  . Marital Status: Not on file  Intimate Partner Violence:   . Fear of Current or Ex-Partner: Not on file  . Emotionally Abused: Not on file  . Physically Abused: Not on file  . Sexually Abused: Not on file    ALLERGIES: Codeine and Lisinopril-hydrochlorothiazide  MEDICATIONS:  Current Outpatient Medications  Medication Sig Dispense Refill  . acetaminophen (TYLENOL) 325 MG tablet Take 2 tablets (650 mg total) by mouth every 6 (six) hours.    . Baclofen 5 MG TABS Take 5 mg by mouth 3 (three) times daily. 90 tablet 0  . dronabinol (MARINOL) 5 MG capsule Take 1 capsule (5 mg total) by mouth 2 (two) times daily before lunch and supper. 60 capsule 0  . feeding supplement, ENSURE ENLIVE, (  ENSURE ENLIVE) LIQD Take 237 mLs by mouth 2 (two) times daily between meals. 237 mL 12  . fentaNYL (DURAGESIC) 50 MCG/HR Place 1 patch onto the skin every 3 (three) days for 15 days. 5 patch 0  . gabapentin (NEURONTIN) 300 MG capsule Take 1 capsule (300 mg total) by mouth 3 (three) times daily. 90 capsule 1  . levETIRAcetam (KEPPRA) 500 MG tablet Take 1 tablet (500 mg total) by mouth 2 (two) times daily. 60 tablet 1  . methocarbamol (ROBAXIN) 500 MG tablet Take 1 tablet (500 mg total) by mouth every 6 (six) hours as needed for muscle spasms. 60 tablet 0  . NARCAN 4 MG/0.1ML LIQD nasal spray kit 1 spray once.    . pantoprazole (PROTONIX) 40 MG tablet Take 1 tablet (40 mg total) by mouth daily with supper. 30 tablet 1  . promethazine (PHENERGAN) 12.5 MG tablet Take 1-2 tablets (12.5-25 mg total) by mouth every 4 (four) hours as needed for refractory nausea / vomiting. 30 tablet 0  . senna-docusate (SENOKOT-S) 8.6-50 MG tablet Take 1 tablet by mouth 2 (two) times daily. 60 tablet 0  . HYDROmorphone (DILAUDID) 4 MG  tablet Take 1 tablet (4 mg total) by mouth every 4 (four) hours as needed for moderate pain or severe pain. 30 tablet 0  . HYDROmorphone HCl (DILAUDID PO) Take by mouth as needed.    Marland Kitchen ketorolac (TORADOL) 10 MG tablet Take 10 mg by mouth every 6 (six) hours as needed.    Marland Kitchen LORazepam (ATIVAN) 1 MG tablet Take 1 tablet (1 mg total) by mouth as needed for anxiety (30 minutes prior to MRI and brain radiation treatment(s)). 8 tablet 0   Current Facility-Administered Medications  Medication Dose Route Frequency Provider Last Rate Last Admin  . morphine 4 MG/ML injection 2 mg  2 mg Intramuscular Once Bruning, Ashlyn, PA-C      . morphine 4 MG/ML injection 4 mg  4 mg Intravenous Once Bruning, Ashlyn, PA-C       Facility-Administered Medications Ordered in Other Encounters  Medication Dose Route Frequency Provider Last Rate Last Admin  . 0.9 %  sodium chloride infusion   Intravenous Continuous Volanda Napoleon, MD 500 mL/hr at 05/03/19 1326 New Bag at 05/03/19 1326    REVIEW OF SYSTEMS:  On review of systems, the patient reports that she is doing well overall. She denies any chest pain, shortness of breath, cough, fevers, chills, night sweats, unintended weight changes. She denies any bowel or bladder disturbances, and denies abdominal pain, vomiting. She denies any new musculoskeletal or joint aches or pains. She reports nausea managed with phenergan, dizziness when standing, and right hand weakness/numbness with coordination issues. A complete review of systems is obtained and is otherwise negative.    PHYSICAL EXAM:  Wt Readings from Last 3 Encounters:  05/03/19 132 lb (59.9 kg)  04/30/19 134 lb (60.8 kg)  04/13/19 130 lb 11.7 oz (59.3 kg)   Temp Readings from Last 3 Encounters:  05/03/19 97.7 F (36.5 C) (Temporal)  05/03/19 (!) 97.3 F (36.3 C) (Temporal)  04/30/19 97.8 F (36.6 C)   BP Readings from Last 3 Encounters:  05/03/19 121/74  05/03/19 104/81  05/02/19 139/78   Pulse  Readings from Last 3 Encounters:  05/03/19 86  05/03/19 93  05/02/19 71   Pain Assessment Pain Score: 10-Worst pain ever Pain Frequency: Constant Pain Loc: Shoulder(left)/10  In general this is a well appearing woman in mild distress. She's alert and oriented x4  and appropriate throughout the examination. Cardiopulmonary assessment is negative for acute distress and she exhibits normal effort.    KPS = 80  100 - Normal; no complaints; no evidence of disease. 90   - Able to carry on normal activity; minor signs or symptoms of disease. 80   - Normal activity with effort; some signs or symptoms of disease. 6   - Cares for self; unable to carry on normal activity or to do active work. 60   - Requires occasional assistance, but is able to care for most of his personal needs. 50   - Requires considerable assistance and frequent medical care. 92   - Disabled; requires special care and assistance. 30   - Severely disabled; hospital admission is indicated although death not imminent. 86   - Very sick; hospital admission necessary; active supportive treatment necessary. 10   - Moribund; fatal processes progressing rapidly. 0     - Dead  Karnofsky DA, Abelmann [place], Craver LS and Burchenal [facility] [phone]) The use of the nitrogen mustards in the palliative treatment of carcinoma: with particular reference to bronchogenic carcinoma Cancer 1 634-56  LABORATORY DATA:  Lab Results  Component Value Date   WBC 13.7 (H) 05/03/2019   HGB 11.5 (L) 05/03/2019   HCT 35.4 (L) 05/03/2019   MCV 93.4 05/03/2019   PLT 603 (H) 05/03/2019   Lab Results  Component Value Date   NA 141 05/03/2019   K 4.1 05/03/2019   CL 107 05/03/2019   CO2 17 (L) 05/03/2019   Lab Results  Component Value Date   ALT 9 05/03/2019   AST 10 (L) 05/03/2019   ALKPHOS 206 (H) 05/03/2019   BILITOT 0.4 05/03/2019     RADIOGRAPHY: MR BRAIN W WO CONTRAST  Result Date: 04/06/2019 CLINICAL DATA:  Status post tumor resection  EXAM: MRI HEAD WITHOUT AND WITH CONTRAST TECHNIQUE: Multiplanar, multiecho pulse sequences of the brain and surrounding structures were obtained without and with intravenous contrast. CONTRAST:  74m GADAVIST GADOBUTROL 1 MMOL/ML IV SOLN COMPARISON:  Brain MRI 04/04/2019 FINDINGS: Brain: Status post resection of contrast-enhancing lesion in the superior left frontal lobe. There is a small resection cavity without residual contrast enhancement. The degree of surrounding hyperintense T2-weighted signal is unchanged. There is an area of diffusion restriction inferior to the resection site, likely indicating some acute ischemia. There is near confluent white matter hyperintensity within both hemispheres. Vascular: Flow voids are preserved Skull and upper cervical spine: Status post left frontal craniotomy. Sinuses/Orbits: Negative Other: None IMPRESSION: 1. Status post resection of superior left frontal lobe mass with unchanged degree of surrounding hyperintense T2-weighted signal. No residual contrast enhancement. 2. Small area of diffusion restriction inferior to the resection site, likely indicating some acute ischemia. Electronically Signed   By: KUlyses JarredM.D.   On: 04/06/2019 05:56   MR BRAIN W WO CONTRAST  Addendum Date: 04/05/2019   ADDENDUM REPORT: 04/05/2019 09:29 ADDENDUM: FA map imaging with successfully created today. The extensive edema in the region of the left vertex tumor obscures fiber tract evaluation on the left. However, using the right-side for mirror image evaluation, the lesion is in fact in the motor strip. Electronically Signed   By: MNelson ChimesM.D.   On: 04/05/2019 09:29   Result Date: 04/05/2019 CLINICAL DATA:  Right-sided weakness. Solitary metastasis posterior left frontal lobe. BrainLAB protocol EXAM: MRI HEAD WITHOUT AND WITH CONTRAST TECHNIQUE: Multiplanar, multiecho pulse sequences of the brain and surrounding structures were obtained without and  with intravenous contrast.  CONTRAST:  6.45m GADAVIST GADOBUTROL 1 MMOL/ML IV SOLN COMPARISON:  None. FINDINGS: Brain: BrainLAB protocol is done for operative utilization. No change since yesterday's exam with respect to the extent of disease. 1.2 cm height left posterior frontal metastatic lesion with moderate surrounding vasogenic edema. Chronic small-vessel ischemic changes elsewhere throughout the cerebral hemispheric white matter. Old lacunar infarctions in the basal ganglia. Chronic small-vessel changes of the brainstem. Scattered punctate foci of hemosiderin deposition related to old small vessel infarctions. Vascular: Major vessels at the base of the brain show flow. Skull and upper cervical spine: Negative Sinuses/Orbits: Clear/normal Other: None IMPRESSION: BrainLAB protocol for operative utilization. 1.2 cm solitary left posterior frontal metastasis with moderate vasogenic edema. Any post processing is deferred to standard staffing hours. Electronically Signed: By: MNelson ChimesM.D. On: 04/04/2019 19:06   NM Bone Scan Whole Body  Result Date: 04/09/2019 CLINICAL DATA:  Non-small-cell lung cancer. Severe left shoulder pain. EXAM: NUCLEAR MEDICINE WHOLE BODY BONE SCAN TECHNIQUE: Whole body anterior and posterior images were obtained approximately 3 hours after intravenous injection of radiopharmaceutical. RADIOPHARMACEUTICALS:  21.3 mCi Technetium-986mDP IV COMPARISON:  CT 04/02/2019.  Chest x-ray 04/01/2019. FINDINGS: Bilateral renal function and excretion. Focal areas of increased activity noted over multiple ribs bilaterally, L3 vertebral body, and left acetabular region. These findings are consistent with metastatic disease. No focal abnormalities noted about the left shoulder. IMPRESSION: Findings consistent with bony metastatic disease as above. Left shoulder region is unremarkable. Electronically Signed   By: ThMarcello MooresRegister   On: 04/09/2019 11:21   VAS [place]OWER EXTREMITY VENOUS (DVT)  Result Date: 04/21/2019  Lower  Venous Study Indications: Swelling.  Comparison Study: 04/08/2019- age indeterminate DVT right femoral, profunda                   femoral, popliteal, posterior tibial, peroneal veins. Performing Technologist: MiMaudry MayhewHA, RDMS, RVT, RDCS  Examination Guidelines: A complete evaluation includes B-mode imaging, spectral Doppler, color Doppler, and power Doppler as needed of all accessible portions of each vessel. Bilateral testing is considered an integral part of a complete examination. Limited examinations for reoccurring indications may be performed as noted.  +---------+---------------+---------+-----------+----------+--------------+ RIGHT    CompressibilityPhasicitySpontaneityPropertiesThrombus Aging +---------+---------------+---------+-----------+----------+--------------+ CFV      Full           Yes      Yes                                 +---------+---------------+---------+-----------+----------+--------------+ SFJ      Full                                                        +---------+---------------+---------+-----------+----------+--------------+ FV Prox  None                                         Acute          +---------+---------------+---------+-----------+----------+--------------+ FV Mid   None                    No  Acute          +---------+---------------+---------+-----------+----------+--------------+ FV DistalNone                                         Acute          +---------+---------------+---------+-----------+----------+--------------+ PFV      None                                         Acute          +---------+---------------+---------+-----------+----------+--------------+ POP      None           Yes      Yes                  Acute          +---------+---------------+---------+-----------+----------+--------------+ PTV      None                                         Acute           +---------+---------------+---------+-----------+----------+--------------+ PERO     None                                         Acute          +---------+---------------+---------+-----------+----------+--------------+  +----+---------------+---------+-----------+----------+--------------+ LEFTCompressibilityPhasicitySpontaneityPropertiesThrombus Aging +----+---------------+---------+-----------+----------+--------------+ CFV Full           Yes      Yes                                 +----+---------------+---------+-----------+----------+--------------+  Summary: Right: Findings consistent with acute deep vein thrombosis involving the right femoral vein, right proximal profunda vein, right popliteal vein, right posterior tibial veins, and right peroneal veins. No cystic structure found in the popliteal fossa. Left: No evidence of common femoral vein obstruction. When compared to prior study, right lower extremity veins with thrombus are more dilated during this exam with less echogenic components. This is suggestive of acute changes.  *See table(s) above for measurements and observations. Electronically signed by Monica Martinez MD on 04/21/2019 at 1:49:52 PM.    Final    VAS [place] LOWER EXTREMITY VENOUS (DVT)  Result Date: 04/09/2019  Lower Venous Study Indications: Swelling, and Pain.  Risk Factors: Surgery left craniotomy tumor on 04/05/19. Comparison Study: No priors. Performing Technologist: Oda Cogan RDMS, RVT  Examination Guidelines: A complete evaluation includes B-mode imaging, spectral Doppler, color Doppler, and power Doppler as needed of all accessible portions of each vessel. Bilateral testing is considered an integral part of a complete examination. Limited examinations for reoccurring indications may be performed as noted.  +---------+---------------+---------+-----------+----------+-----------------+ RIGHT     CompressibilityPhasicitySpontaneityPropertiesThrombus Aging    +---------+---------------+---------+-----------+----------+-----------------+ CFV      Full           Yes      Yes                                    +---------+---------------+---------+-----------+----------+-----------------+  SFJ      Full                                                           +---------+---------------+---------+-----------+----------+-----------------+ FV Prox  None                                         Age Indeterminate +---------+---------------+---------+-----------+----------+-----------------+ FV Mid   None                                         Age Indeterminate +---------+---------------+---------+-----------+----------+-----------------+ FV DistalNone                                         Age Indeterminate +---------+---------------+---------+-----------+----------+-----------------+ PFV      Full                                         Age Indeterminate +---------+---------------+---------+-----------+----------+-----------------+ POP      None           No       No                   Age Indeterminate +---------+---------------+---------+-----------+----------+-----------------+ PTV      None                                         Age Indeterminate +---------+---------------+---------+-----------+----------+-----------------+ PERO     None                                         Age Indeterminate +---------+---------------+---------+-----------+----------+-----------------+ Gastroc  None                                                           +---------+---------------+---------+-----------+----------+-----------------+ Thrombus appears sub-acute.  +---------+---------------+---------+-----------+----------+--------------+ LEFT     CompressibilityPhasicitySpontaneityPropertiesThrombus Aging  +---------+---------------+---------+-----------+----------+--------------+ CFV      Full           Yes      Yes                                 +---------+---------------+---------+-----------+----------+--------------+ SFJ      Full                                                        +---------+---------------+---------+-----------+----------+--------------+ FV Prox  Full                                                        +---------+---------------+---------+-----------+----------+--------------+ FV Mid   Full                                                        +---------+---------------+---------+-----------+----------+--------------+ FV DistalFull                                                        +---------+---------------+---------+-----------+----------+--------------+ PFV      Full                                                        +---------+---------------+---------+-----------+----------+--------------+ POP      Full           Yes      Yes                                 +---------+---------------+---------+-----------+----------+--------------+ PTV      Full                                                        +---------+---------------+---------+-----------+----------+--------------+ PERO     Full                                                        +---------+---------------+---------+-----------+----------+--------------+    Summary: Right: Findings consistent with age indeterminate deep vein thrombosis involving the right femoral vein, right popliteal vein, right posterior tibial veins, right peroneal veins, and right gastrocnemius veins. Left: There is no evidence of deep vein thrombosis in the lower extremity.  *See table(s) above for measurements and observations. Electronically signed by Harold Barban MD on 04/09/2019 at 8:29:58 AM.    Final       IMPRESSION/PLAN: 68. 64 y.o. female with metastatic lung cancer  s/p resection of a solitary brain met with painful metastases of the left shoulder and left anterior 6th rib.Marland Kitchen  As previously discussed, this has proven to be a NSCLC, and we will therefore offer post-operative SRS in a single fraction, approximately 2 weeks postoperative.  We will also offer a 2 week course of palliative radiotherapy to the painful sites of metastases in the upper left chest wall rib and left scapula.    Nicholos Johns, PA-C    Tyler Pita, MD  [facility] Direct Dial: [phone]  Fax: [phone] [email or web address]  Skype  LinkedIn   This document serves as a record of services personally performed by Tyler Pita, MD and Freeman Caldron, PA-C. It was created on their behalf by Wilburn Mylar, a trained medical scribe. The creation of this record is based on the scribe's personal observations and the provider's statements to them. This document has been checked and approved by the attending provider.

## 2019-04-30 NOTE — Progress Notes (Signed)
Verbal orders entered for morphine and ativan. Initial orders entered by [employer], PA-C were read only and correlating encounter couldn't be found.

## 2019-05-01 DIAGNOSIS — C7931 Secondary malignant neoplasm of brain: Secondary | ICD-10-CM | POA: Diagnosis not present

## 2019-05-01 DIAGNOSIS — C7951 Secondary malignant neoplasm of bone: Secondary | ICD-10-CM | POA: Diagnosis not present

## 2019-05-01 DIAGNOSIS — G9389 Other specified disorders of brain: Secondary | ICD-10-CM | POA: Diagnosis not present

## 2019-05-01 NOTE — Progress Notes (Signed)
Late entry from 04/30/2019 at 1000  Has armband been applied?  Yes.    Does patient have an allergy to IV contrast dye?: No.   Has patient ever received premedication for IV contrast dye?: No.   Does patient take metformin?: No.  If patient does take metformin when was the last dose: n/a     IV site: upper arm right, condition no redness  Has IV site been added to flowsheet?  Yes.

## 2019-05-01 NOTE — Progress Notes (Signed)
Patient not charged for saline flush since IV ended up not being used. IV placed to administer contrast for brain simulation. Patient only able to tolerate chest simulation thus brain simulation not done.

## 2019-05-02 ENCOUNTER — Telehealth: Payer: Self-pay | Admitting: Radiation Therapy

## 2019-05-02 ENCOUNTER — Encounter (HOSPITAL_COMMUNITY): Payer: Self-pay

## 2019-05-02 ENCOUNTER — Encounter (HOSPITAL_COMMUNITY)
Admission: RE | Admit: 2019-05-02 | Discharge: 2019-05-02 | Disposition: A | Discharge status: Home / Self Care | Payer: BC Managed Care – PPO | Source: Ambulatory Visit | Attending: Hematology & Oncology | Admitting: Hematology & Oncology

## 2019-05-02 ENCOUNTER — Ambulatory Visit: Payer: BC Managed Care – PPO | Admitting: Radiation Oncology

## 2019-05-02 ENCOUNTER — Other Ambulatory Visit: Payer: Self-pay | Admitting: Urology

## 2019-05-02 ENCOUNTER — Ambulatory Visit
Admission: RE | Admit: 2019-05-02 | Discharge: 2019-05-02 | Disposition: A | Discharge status: Home / Self Care | Payer: BC Managed Care – PPO | Source: Ambulatory Visit | Attending: Radiation Oncology | Admitting: Radiation Oncology

## 2019-05-02 ENCOUNTER — Other Ambulatory Visit: Payer: Self-pay

## 2019-05-02 VITALS — BP 139/78 | HR 71

## 2019-05-02 DIAGNOSIS — G9389 Other specified disorders of brain: Secondary | ICD-10-CM | POA: Diagnosis not present

## 2019-05-02 DIAGNOSIS — C7931 Secondary malignant neoplasm of brain: Secondary | ICD-10-CM

## 2019-05-02 DIAGNOSIS — G893 Neoplasm related pain (acute) (chronic): Secondary | ICD-10-CM

## 2019-05-02 DIAGNOSIS — C7951 Secondary malignant neoplasm of bone: Secondary | ICD-10-CM | POA: Diagnosis not present

## 2019-05-02 DIAGNOSIS — C3491 Malignant neoplasm of unspecified part of right bronchus or lung: Secondary | ICD-10-CM | POA: Diagnosis not present

## 2019-05-02 LAB — GLUCOSE, CAPILLARY: Glucose-Capillary: 98 mg/dL (ref 70–99)

## 2019-05-02 MED ORDER — MORPHINE SULFATE 4 MG/ML IJ SOLN
4.0000 mg | INTRAMUSCULAR | Status: AC
  Administered 2019-05-02: 4 mg via INTRAMUSCULAR
  Filled 2019-05-02: qty 1

## 2019-05-02 MED ORDER — HYDROMORPHONE HCL 4 MG PO TABS: 4.0000 mg | ORAL_TABLET | ORAL | 0 refills | Status: DC | PRN

## 2019-05-02 MED ORDER — MORPHINE SULFATE (PF) 4 MG/ML IV SOLN
4.0000 mg | Freq: Once | INTRAVENOUS | Status: DC
  Filled 2019-05-02: qty 1

## 2019-05-02 NOTE — Progress Notes (Signed)
Received patient in the clinic prior to radiation therapy. Patient reports scapular pain 10 on a scale of 0-10 preventing her from laying flat for radiation therapy. Administered Morphine 4 mg as directed IM left deltoid by [name], PA-C. Patient tolerated this well. Explained to the patient and her husband that [employer], PA-C would prefer that Dr. Marin Olp manage adjustments to her pain medication since he is the initial prescriber and will follow her long after radiation is complete. Advised her husband per Freeman Caldron, PA-C to call Dr. Antonieta Pert office for direction on pain management. Explained that in the meantime Ashlyn has switched her oral pain medication to Dilaudid and sent the rx to [facility] to use in place of OXY. Stressed and repeated multiple times to both patient and husband that she is NOT to take both the oxy and the Dilaudid. Explained that Seymour believes Dr. Marin Olp may want to increase her Fentanyl patch again. Both patient and husband verbalized understanding of all reviewed.

## 2019-05-02 NOTE — Telephone Encounter (Addendum)
Ms. Schoenfeldt required pain medication to complete her initial fraction due to severe pain and the inability to remain still on the treatment couch. Mr. Difrancesco was waiting in the Rad Onc waiting room while the treatment therapists were working with his wife. After her treatment was completed, Vivien Rota, the patient transporter, was instructed by Mrs Slaubaugh to take her upstairs because her husband was waiting in the upstairs lobby.  After not seeing him upstairs, Vivien Rota returned to find him in the downstairs waiting area and let him know that Mrs. Kuwahara was ready for him in the upstairs lobby.    Mr. Lesure called me, he asked if his wife had completed her treatment. He knew that she was in pain and having a difficult time staying still. I said yes, she had received her first treatment. He proceeded to say that no-one is communicating with him, that he was waiting where he was told to be and then nobody came to tell him about his wife. He was upset that his wife had been taken upstairs and left there in a medicated state without him and that he didn't know what was going on.   I was apologetic and stated that I was not sure where the confusion had come from because I was not personally back there during her treatment or around when she had been taken upstairs. He was noticeably upset and stated that he needed to hang up, and that if we do not or cannot communicate with him what is going on with his wife then he does not think this is the place that they need to be. I again tried to speak with him, but he was no longer interested in talking and said that he needed to hang up.   I will route this message to Dr. Tammi Klippel and his PA, Ashlyn so they are aware.    [name] R.T.(R)(T) Radiation Special Procedures Navigator

## 2019-05-03 ENCOUNTER — Other Ambulatory Visit: Payer: Self-pay | Admitting: Radiation Therapy

## 2019-05-03 ENCOUNTER — Ambulatory Visit: Payer: BC Managed Care – PPO

## 2019-05-03 ENCOUNTER — Inpatient Hospital Stay: Payer: BC Managed Care – PPO

## 2019-05-03 ENCOUNTER — Encounter: Payer: Self-pay | Admitting: Hematology & Oncology

## 2019-05-03 ENCOUNTER — Other Ambulatory Visit: Payer: Self-pay

## 2019-05-03 ENCOUNTER — Encounter (HOSPITAL_COMMUNITY): Payer: Self-pay | Admitting: Hematology & Oncology

## 2019-05-03 ENCOUNTER — Inpatient Hospital Stay: Payer: BC Managed Care – PPO | Attending: Hematology & Oncology | Admitting: Hematology & Oncology

## 2019-05-03 ENCOUNTER — Encounter: Payer: Self-pay | Admitting: *Deleted

## 2019-05-03 VITALS — BP 104/81 | HR 93 | Temp 97.3°F | Resp 20 | Wt 132.0 lb

## 2019-05-03 DIAGNOSIS — C3411 Malignant neoplasm of upper lobe, right bronchus or lung: Secondary | ICD-10-CM | POA: Diagnosis not present

## 2019-05-03 DIAGNOSIS — R531 Weakness: Secondary | ICD-10-CM | POA: Diagnosis not present

## 2019-05-03 DIAGNOSIS — C7800 Secondary malignant neoplasm of unspecified lung: Secondary | ICD-10-CM | POA: Diagnosis not present

## 2019-05-03 DIAGNOSIS — C7951 Secondary malignant neoplasm of bone: Secondary | ICD-10-CM | POA: Diagnosis not present

## 2019-05-03 DIAGNOSIS — C7931 Secondary malignant neoplasm of brain: Secondary | ICD-10-CM | POA: Insufficient documentation

## 2019-05-03 DIAGNOSIS — Z452 Encounter for adjustment and management of vascular access device: Secondary | ICD-10-CM | POA: Insufficient documentation

## 2019-05-03 DIAGNOSIS — M549 Dorsalgia, unspecified: Secondary | ICD-10-CM | POA: Diagnosis not present

## 2019-05-03 DIAGNOSIS — F17210 Nicotine dependence, cigarettes, uncomplicated: Secondary | ICD-10-CM | POA: Insufficient documentation

## 2019-05-03 DIAGNOSIS — Z791 Long term (current) use of non-steroidal anti-inflammatories (NSAID): Secondary | ICD-10-CM | POA: Diagnosis not present

## 2019-05-03 DIAGNOSIS — Z7189 Other specified counseling: Secondary | ICD-10-CM

## 2019-05-03 DIAGNOSIS — Z79899 Other long term (current) drug therapy: Secondary | ICD-10-CM | POA: Diagnosis not present

## 2019-05-03 DIAGNOSIS — Z5111 Encounter for antineoplastic chemotherapy: Secondary | ICD-10-CM | POA: Insufficient documentation

## 2019-05-03 DIAGNOSIS — C3491 Malignant neoplasm of unspecified part of right bronchus or lung: Secondary | ICD-10-CM

## 2019-05-03 DIAGNOSIS — I82461 Acute embolism and thrombosis of right calf muscular vein: Secondary | ICD-10-CM

## 2019-05-03 LAB — CMP (CANCER CENTER ONLY)
ALT: 9 U/L (ref 0–44)
AST: 10 U/L — ABNORMAL LOW (ref 15–41)
Albumin: 3.4 g/dL — ABNORMAL LOW (ref 3.5–5.0)
Alkaline Phosphatase: 206 U/L — ABNORMAL HIGH (ref 38–126)
Anion gap: 17 — ABNORMAL HIGH (ref 5–15)
BUN: 6 mg/dL — ABNORMAL LOW (ref 8–23)
CO2: 17 mmol/L — ABNORMAL LOW (ref 22–32)
Calcium: 9.1 mg/dL (ref 8.9–10.3)
Chloride: 107 mmol/L (ref 98–111)
Creatinine: 0.44 mg/dL (ref 0.44–1.00)
GFR, Est AFR Am: >60 mL/min (ref >60)
GFR, Estimated: >60 mL/min (ref >60)
Glucose, Bld: 96 mg/dL (ref 70–99)
Potassium: 4.1 mmol/L (ref 3.5–5.1)
Sodium: 141 mmol/L (ref 135–145)
Total Bilirubin: 0.4 mg/dL (ref 0.3–1.2)
Total Protein: 6.5 g/dL (ref 6.5–8.1)

## 2019-05-03 LAB — CBC WITH DIFFERENTIAL (CANCER CENTER ONLY)
Abs Immature Granulocytes: 0.22 10*3/uL — ABNORMAL HIGH (ref 0.00–0.07)
Basophils Absolute: 0.1 10*3/uL (ref 0.0–0.1)
Basophils Relative: 0 %
Eosinophils Absolute: 0.1 10*3/uL (ref 0.0–0.5)
Eosinophils Relative: 0 %
HCT: 35.4 % — ABNORMAL LOW (ref 36.0–46.0)
Hemoglobin: 11.5 g/dL — ABNORMAL LOW (ref 12.0–15.0)
Immature Granulocytes: 2 %
Lymphocytes Relative: 10 %
Lymphs Abs: 1.4 10*3/uL (ref 0.7–4.0)
MCH: 30.3 pg (ref 26.0–34.0)
MCHC: 32.5 g/dL (ref 30.0–36.0)
MCV: 93.4 fL (ref 80.0–100.0)
Monocytes Absolute: 1.2 10*3/uL — ABNORMAL HIGH (ref 0.1–1.0)
Monocytes Relative: 8 %
Neutro Abs: 10.9 10*3/uL — ABNORMAL HIGH (ref 1.7–7.7)
Neutrophils Relative %: 80 %
Platelet Count: 603 10*3/uL — ABNORMAL HIGH (ref 150–400)
RBC: 3.79 MIL/uL — ABNORMAL LOW (ref 3.87–5.11)
RDW: 15.2 % (ref 11.5–15.5)
WBC Count: 13.7 10*3/uL — ABNORMAL HIGH (ref 4.0–10.5)
nRBC: 0 % (ref 0.0–0.2)

## 2019-05-03 MED ORDER — KETOROLAC TROMETHAMINE 15 MG/ML IJ SOLN
INTRAMUSCULAR | Status: AC
  Filled 2019-05-03: qty 2

## 2019-05-03 MED ORDER — SODIUM CHLORIDE 0.9 % IV SOLN
INTRAVENOUS | Status: AC
  Filled 2019-05-03 (×2): qty 250

## 2019-05-03 MED ORDER — KETOROLAC TROMETHAMINE 15 MG/ML IJ SOLN
30.0000 mg | Freq: Once | INTRAMUSCULAR | Status: AC
  Administered 2019-05-03: 30 mg via INTRAVENOUS
  Filled 2019-05-03: qty 2

## 2019-05-03 MED ORDER — HYDROMORPHONE HCL 4 MG PO TABS: 4.0000 mg | ORAL_TABLET | ORAL | 0 refills | Status: DC | PRN

## 2019-05-03 MED ORDER — HYDROMORPHONE HCL 4 MG/ML IJ SOLN
4.0000 mg | Freq: Once | INTRAMUSCULAR | Status: AC
  Administered 2019-05-03: 4 mg via INTRAVENOUS

## 2019-05-03 MED ORDER — FENTANYL 50 MCG/HR TD PT72: 1.0000 | MEDICATED_PATCH | TRANSDERMAL | 0 refills | Status: DC

## 2019-05-03 MED ORDER — HYDROMORPHONE HCL 4 MG/ML IJ SOLN
INTRAMUSCULAR | Status: AC
  Filled 2019-05-03: qty 1

## 2019-05-03 MED ORDER — KETOROLAC TROMETHAMINE 10 MG PO TABS: 10.0000 mg | ORAL_TABLET | Freq: Four times a day (QID) | ORAL | 2 refills | Status: DC | PRN

## 2019-05-03 NOTE — Progress Notes (Signed)
  Radiation Oncology         [phone] ________________________________  Name: [name] MRN: [id number]  Date: 04/30/2019  DOB: Jan 25, 1956  SIMULATION AND TREATMENT PLANNING NOTE    ICD-10-CM   1. Metastatic cancer to brain [facility])  C79.31   2. Lung cancer metastatic to brain ([place])  C34.90    C79.31   3. Adenocarcinoma of lung, stage 4, right ([facility])  C34.91     DIAGNOSIS:  64 yo woman with brain metastasis and painful mets to left scapula and left ribs  NARRATIVE:  The patient was brought to the [facility].  Identity was confirmed.  All relevant records and images related to the planned course of therapy were reviewed.  The patient freely provided informed written consent to proceed with treatment after reviewing the details related to the planned course of therapy. The consent form was witnessed and verified by the simulation staff.  Then, the patient was set-up in a stable reproducible  supine position for radiation therapy.  CT images were obtained.  Surface markings were placed.  The CT images were loaded into the planning software.  Then the target and avoidance structures were contoured.  Treatment planning then occurred.  The radiation prescription was entered and confirmed.  Then, I designed and supervised the construction of a total of 4 medically necessary complex treatment devices, MLCs to shield heart and lungs.  I have requested : 3D Simulation  I have requested a DVH of the following structures: left lung, right lung and targets.   PLAN:  The patient will receive 30 Gy in 10 fractions.  ________________________________  Sheral Apley Tammi Klippel, M.D.

## 2019-05-03 NOTE — Progress Notes (Signed)
Initial RN Navigator Patient Visit  Name: [name] Date of Referral : Hospital Follow Up Diagnosis: Metastatic Lung Cancer  Met with patient and her husband prior to their visit with MD. Hanley Seamen patient "Your Patient Navigator" handout which explains my role, areas in which I am able to help, and all the contact information for myself and the office. Also gave patient MD and Navigator business card. Reviewed with patient the general overview of expected course after initial diagnosis and time frame for all steps to be completed.  New patient packet given to patient which includes: orientation to office and staff; campus directory; education on My Chart and Advance Directives; and patient centered education on Lung Cancer  Patient completed visit with Dr. Marin Olp  Prior to patient visit with Dr Marin Olp, it was clear that patient was extremely uncomfortable experiencing 10/10 pain. She was tearful and unable to sit still. Spoke to Dr Marin Olp and we transferred her to the infusion treatment room to administer some medications in an attempt to treat her pain. She was placed in the private treatment bedroom to allow for Dr Marin Olp to complete her visit in there. Husband at the bedside.  Dr Marin Olp would like radiation treatments to be placed on hold. Sent a message to the radiation scheduler to cancel all appointment at this time.  Dr Marin Olp would like treatment to begin next week.   Patient scheduled for:  PET 05/06/2019 Chemo Education 05/07/2019 Nutrition Consult 05/07/2019 Port Placement 05/08/2019 First Treatment 05/09/2019  Reviewed/printed patient calendar for next week. With patient and husband. Reviewed prep for the PET including 8am arrival, NPO after midnight and low carb meal the night before. Reviewed the prep for port placement including 12p arrival, NPO after 7am and the need for a driver. All appointments reviewed for time, date and location. Calendar was reviewed and given to  patient.   Boost and Ensure samples given to patient.   Patient understands all follow up procedures and expectations. They have my number to reach out for any further clarification or additional needs.

## 2019-05-03 NOTE — Patient Instructions (Signed)

## 2019-05-03 NOTE — Progress Notes (Signed)
START ON PATHWAY REGIMEN - Non-Small Cell Lung     A cycle is every 21 days:     Pembrolizumab      Pemetrexed      Carboplatin   **Always confirm dose/schedule in your pharmacy ordering system**  Patient Characteristics: Stage IV Metastatic, Nonsquamous, Initial Chemotherapy/Immunotherapy, PS = 0, 1, ALK Rearrangement Negative and ROS1 Rearrangement Negative and NTRK Gene Fusion?Negative and RET Gene Fusion?Negative and EGFR Mutation Negative/Non?Sensitizing, PD-L1  Expression Positive 1-49% (TPS) / Negative / Not Tested / Awaiting Test Results and Immunotherapy Candidate AJCC T Category: T4 Current Disease Status: Distant Metastases AJCC N Category: N3 AJCC M Category: M1c AJCC 8 Stage Grouping: IVB Histology: Nonsquamous Cell ROS1 Rearrangement Status: Negative T790M Mutation Status: Not Applicable - EGFR Mutation Negative/Unknown Other Mutations/Biomarkers: No Other Actionable Mutations Chemotherapy/Immunotherapy LOT: Initial Chemotherapy/Immunotherapy Molecular Targeted Therapy: Not Appropriate MET Exon 14 Mutation Status: Negative RET Gene Fusion Status: Negative EGFR Mutation Status: Negative/Wild Type NTRK Gene Fusion Status: Negative PD-L1 Expression Status: PD-L1 Positive 1-49% (TPS) ALK Rearrangement Status: Negative BRAF V600E Mutation Status: Negative ECOG Performance Status: 1 Biomarker Assessment Status Confirmation: All Genomic Markers Negative or Only MET+ or BRAF+ Immunotherapy Candidate Status: Candidate for Immunotherapy Intent of Therapy: Non-Curative / Palliative Intent, Discussed with Patient

## 2019-05-05 ENCOUNTER — Other Ambulatory Visit: Payer: Self-pay | Admitting: Internal Medicine

## 2019-05-06 ENCOUNTER — Other Ambulatory Visit: Payer: Self-pay | Admitting: *Deleted

## 2019-05-06 ENCOUNTER — Ambulatory Visit (HOSPITAL_COMMUNITY)
Admission: RE | Admit: 2019-05-06 | Discharge: 2019-05-06 | Disposition: A | Discharge status: Home / Self Care | Payer: BC Managed Care – PPO | Source: Ambulatory Visit | Attending: Hematology & Oncology | Admitting: Hematology & Oncology

## 2019-05-06 ENCOUNTER — Ambulatory Visit: Payer: BC Managed Care – PPO

## 2019-05-06 ENCOUNTER — Other Ambulatory Visit: Payer: Self-pay

## 2019-05-06 DIAGNOSIS — J90 Pleural effusion, not elsewhere classified: Secondary | ICD-10-CM | POA: Insufficient documentation

## 2019-05-06 DIAGNOSIS — I2510 Atherosclerotic heart disease of native coronary artery without angina pectoris: Secondary | ICD-10-CM | POA: Insufficient documentation

## 2019-05-06 DIAGNOSIS — C771 Secondary and unspecified malignant neoplasm of intrathoracic lymph nodes: Secondary | ICD-10-CM | POA: Insufficient documentation

## 2019-05-06 DIAGNOSIS — I700 Atherosclerosis of aorta: Secondary | ICD-10-CM | POA: Insufficient documentation

## 2019-05-06 DIAGNOSIS — C7800 Secondary malignant neoplasm of unspecified lung: Secondary | ICD-10-CM | POA: Diagnosis not present

## 2019-05-06 DIAGNOSIS — C3490 Malignant neoplasm of unspecified part of unspecified bronchus or lung: Secondary | ICD-10-CM | POA: Diagnosis not present

## 2019-05-06 DIAGNOSIS — C7951 Secondary malignant neoplasm of bone: Secondary | ICD-10-CM | POA: Diagnosis not present

## 2019-05-06 DIAGNOSIS — C3491 Malignant neoplasm of unspecified part of right bronchus or lung: Secondary | ICD-10-CM | POA: Diagnosis not present

## 2019-05-06 LAB — LACTATE DEHYDROGENASE: LDH: 325 U/L — ABNORMAL HIGH (ref 98–192)

## 2019-05-06 LAB — IRON AND TIBC
Iron: 28 ug/dL — ABNORMAL LOW (ref 41–142)
Saturation Ratios: 18 % — ABNORMAL LOW (ref 21–57)
TIBC: 153 ug/dL — ABNORMAL LOW (ref 236–444)
UIBC: 124 ug/dL (ref 120–384)

## 2019-05-06 LAB — FERRITIN: Ferritin: 740 ng/mL — ABNORMAL HIGH (ref 11–307)

## 2019-05-06 LAB — GLUCOSE, CAPILLARY: Glucose-Capillary: 94 mg/dL (ref 70–99)

## 2019-05-06 MED ORDER — LACTULOSE 10 GM/15ML PO SOLN: 20.0000 g | Freq: Two times a day (BID) | ORAL | 4 refills | Status: AC

## 2019-05-06 MED ORDER — PROMETHAZINE HCL 12.5 MG PO TABS: 12.5000 mg | ORAL_TABLET | ORAL | 0 refills | Status: DC | PRN

## 2019-05-06 MED ORDER — FLUDEOXYGLUCOSE F - 18 (FDG) INJECTION
6.5500 | Freq: Once | INTRAVENOUS | Status: AC | PRN
  Administered 2019-05-06: 6.55 via INTRAVENOUS

## 2019-05-06 NOTE — Progress Notes (Signed)
Hematology and Oncology Follow Up Visit  [name] [id number] 06/13/1955 63 y.o. 05/06/2019   Principle Diagnosis:  Metastatic adenocarcinoma of the lung-brain and bone metastasis -- NO actionable mutations  Current Therapy:    Carboplatinum/Alimta/pembrolizumab-cycle #1-to start on 05/09/2019  Xgeva 120 mg IM q 3 month      Interim History:  [name] is back for first office visit.  I have been seeing her in the hospital.  She initially presented to [facility] in early December.  She had weakness over on the right side.  She was found to have brain metastasis.  She ultimately was found to have a right lung mass.  She had mediastinal adenopathy.  She had bony metastasis on bone scan..  She did undergo resection of the brain mass on the left side.  This was done on 04/05/2019.  The pathology report ([id number]) showed adenocarcinoma.  We sent the tumor off for molecular testing.  There were no actionable mutations.  He had a low PD-L1 value.  He had a high TMB level.  She has been having a lot of problems with pain.  Is been pain over in the left scapula.  There is no bony metastasis over there.  I am not sure as to why she is having all the pain.  She was in [facility].  She seemed to have some improvement in the right arm strength.  She was have some radiation treatments.  Were going to hold off on these right now.  She comes into the office today with quite a bit of pain.  We gave her some IV pain medication which really seem to help.  She has not had a bowel movement in like a 3 or 4 days.  She might need some lactulose.  Her appetite seems to be doing okay.  I told her that we could try her on some Marinol.  This certainly could help with her appetite.  Might help with some of her pain.  I think that she would be a good a good candidate for chemotherapy/immunotherapy.  She does not have a lot of tumor burden.  As such, I would like to hope that we could cause  some decrease in the tumor burden so that she will not have as much discomfort and have a better quality of life.  She has had no bleeding.  There is been no cough.  She has had no headache.  Overall, I would say her performance status is ECOG 1.  Medications:  Current Outpatient Medications:  .  HYDROmorphone HCl (DILAUDID PO), Take by mouth as needed., Disp: , Rfl:  .  ketorolac (TORADOL) 10 MG tablet, Take 1 tablet (10 mg total) by mouth every 6 (six) hours as needed., Disp: 30 tablet, Rfl: 2 .  acetaminophen (TYLENOL) 325 MG tablet, Take 2 tablets (650 mg total) by mouth every 6 (six) hours., Disp:  , Rfl:  .  Baclofen 5 MG TABS, Take 5 mg by mouth 3 (three) times daily., Disp: 90 tablet, Rfl: 0 .  dronabinol (MARINOL) 5 MG capsule, Take 1 capsule (5 mg total) by mouth 2 (two) times daily before lunch and supper., Disp: 60 capsule, Rfl: 0 .  feeding supplement, ENSURE ENLIVE, (ENSURE ENLIVE) LIQD, Take 237 mLs by mouth 2 (two) times daily between meals., Disp: 237 mL, Rfl: 12 .  fentaNYL (DURAGESIC) 50 MCG/HR, Place 1 patch onto the skin every other day., Disp: 15 patch, Rfl: 0 .  gabapentin (NEURONTIN) 300   MG capsule, Take 1 capsule (300 mg total) by mouth 3 (three) times daily., Disp: 90 capsule, Rfl: 1 .  HYDROmorphone (DILAUDID) 4 MG tablet, Take 1-2 tablets (4-8 mg total) by mouth every 4 (four) hours as needed for moderate pain or severe pain., Disp: 90 tablet, Rfl: 0 .  levETIRAcetam (KEPPRA) 500 MG tablet, Take 1 tablet (500 mg total) by mouth 2 (two) times daily., Disp: 60 tablet, Rfl: 1 .  LORazepam (ATIVAN) 1 MG tablet, Take 1 tablet (1 mg total) by mouth as needed for anxiety (30 minutes prior to MRI and brain radiation treatment(s))., Disp: 8 tablet, Rfl: 0 .  methocarbamol (ROBAXIN) 500 MG tablet, Take 1 tablet (500 mg total) by mouth every 6 (six) hours as needed for muscle spasms., Disp: 60 tablet, Rfl: 0 .  NARCAN 4 MG/0.1ML LIQD nasal spray kit, 1 spray once., Disp: , Rfl:    .  pantoprazole (PROTONIX) 40 MG tablet, Take 1 tablet (40 mg total) by mouth daily with supper., Disp: 30 tablet, Rfl: 1 .  promethazine (PHENERGAN) 12.5 MG tablet, Take 1-2 tablets (12.5-25 mg total) by mouth every 4 (four) hours as needed for refractory nausea / vomiting., Disp: 30 tablet, Rfl: 0 .  senna-docusate (SENOKOT-S) 8.6-50 MG tablet, Take 1 tablet by mouth 2 (two) times daily., Disp: 60 tablet, Rfl: 0  Current Facility-Administered Medications:  .  morphine 4 MG/ML injection 2 mg, 2 mg, Intramuscular, Once, Bruning, Ashlyn, PA-C .  morphine 4 MG/ML injection 4 mg, 4 mg, Intravenous, Once, Bruning, Ashlyn, PA-C  Allergies:  Allergies  Allergen Reactions  . Codeine   . Lisinopril-Hydrochlorothiazide Other (See Comments)    Back pain    Past Medical History, Surgical history, Social history, and Family History were reviewed and updated.  Review of Systems: Review of Systems  Constitutional: Negative.   HENT:  Negative.   Eyes: Negative.   Respiratory: Negative.   Cardiovascular: Negative.   Gastrointestinal: Negative.   Endocrine: Negative.   Genitourinary: Negative.    Musculoskeletal: Positive for arthralgias and back pain.  Skin: Negative.   Neurological: Positive for extremity weakness.  Hematological: Negative.   Psychiatric/Behavioral: Negative.     Physical Exam: Fairly well-developed well-nourished white female in mild distress secondary to pain.  Vital signs show temperature of 97.3.  Pulse 93.  Blood pressure 104/81.  Weight 232 pounds.  Head neck exam shows no ocular or oral lesions.  There are no palpable cervical or supraclavicular lymph nodes.  Lungs are clear bilaterally.  Cardiac exam regular rate and rhythm with no murmurs, rubs or bruits.  Abdomen is soft.  Bowel sounds are slightly decreased.  She has no fluid wave.  There is no palpable liver or spleen tip.  Back exam shows some tenderness in the left scapula.  This is in the inferior portion of the  scapula.  There is no tenderness over the actual spine or hips.  Extremities shows weakness in the right arm.  This is chronic.  Neurological exam shows the weakness in the right arm.  Skin exam shows no rashes, ecchymoses or petechia.      weight is 132 lb (59.9 kg). Her temporal temperature is 97.3 F (36.3 C) (abnormal). Her blood pressure is 104/81 and her pulse is 93. Her respiration is 20 and oxygen saturation is 93%.   Wt Readings from Last 3 Encounters:  05/03/19 132 lb (59.9 kg)  04/30/19 134 lb (60.8 kg)  04/13/19 130 lb 11.7 oz (59.3 kg)      Physical Exam   Lab Results  Component Value Date   WBC 13.7 (H) 05/03/2019   HGB 11.5 (L) 05/03/2019   HCT 35.4 (L) 05/03/2019   MCV 93.4 05/03/2019   PLT 603 (H) 05/03/2019     Chemistry      Component Value Date/Time   NA 141 05/03/2019 1203   K 4.1 05/03/2019 1203   CL 107 05/03/2019 1203   CO2 17 (L) 05/03/2019 1203   BUN 6 (L) 05/03/2019 1203   CREATININE 0.44 05/03/2019 1203      Component Value Date/Time   CALCIUM 9.1 05/03/2019 1203   ALKPHOS 206 (H) 05/03/2019 1203   AST 10 (L) 05/03/2019 1203   ALT 9 05/03/2019 1203   BILITOT 0.4 05/03/2019 1203       Impression and Plan: Ms. Bargar is a 63-year-old yo female.  She is or was a smoker.  She now has metastatic adenocarcinoma of the right lung.  Our goal at this point is her quality of life.  Hopefully, we can improve her pain by systemic therapy..  I told her husband to increase her fentanyl patches to 50 mcg every 2 days.  This hopefully might give a bit a little bit of a more stable state of fentanyl into her system.  I also told him that she can take the hydromorphone 4-8 mg every 4 hours as needed.  In the office, we gave her some IV Toradol.  This seemed to help.  She has some Toradol at home orally.  We will see if some Marinol might help her out a little bit.  I will also give her some lactulose.  I think that she would be a good candidate for  systemic chemotherapy.  Given that this is adenocarcinoma, I would use carboplatinum/Alimta/pembrolizumab.  I think this is reasonable.  I think the chance of response should be over 60%.  She will need to have a Port-A-Cath placed.  She actually is going to have one placed on 05/08/2019.  I am went over the toxicity of treatment.  She and her husband both have a good understanding of the side effects.  She likely will lose her hair.  We will have to be very cautious with respect to neutropenia.  I will give her 2 cycles of treatment and then follow-up with a PET scan.  She is going to have a PET scan on 05/06/2019.  I had to spend over an hour with she and her husband today.  Most the time was spent face-to-face.  I was trying to help with her pain.  I was talking to them about the protocol that we have her chemotherapy.  We will hold off on radiation for right now.  I think this would make them feel a lot better emotionally.  We will start her first treatment of chemotherapy on 05/09/2019.  I will plan to see her back on the day of her second cycle of treatment.    R , MD 1/11/20217:59 AM 

## 2019-05-07 ENCOUNTER — Encounter: Payer: Self-pay | Admitting: *Deleted

## 2019-05-07 ENCOUNTER — Ambulatory Visit: Payer: BC Managed Care – PPO

## 2019-05-07 ENCOUNTER — Inpatient Hospital Stay: Payer: BC Managed Care – PPO

## 2019-05-07 ENCOUNTER — Ambulatory Visit: Payer: BC Managed Care – PPO | Admitting: Radiation Oncology

## 2019-05-07 ENCOUNTER — Encounter: Payer: Self-pay | Admitting: Physical Medicine and Rehabilitation

## 2019-05-07 ENCOUNTER — Encounter
Payer: BC Managed Care – PPO | Attending: Physical Medicine and Rehabilitation | Admitting: Physical Medicine and Rehabilitation

## 2019-05-07 ENCOUNTER — Other Ambulatory Visit: Payer: Self-pay | Admitting: Physician Assistant

## 2019-05-07 ENCOUNTER — Inpatient Hospital Stay: Payer: BC Managed Care – PPO | Admitting: Nutrition

## 2019-05-07 DIAGNOSIS — G893 Neoplasm related pain (acute) (chronic): Secondary | ICD-10-CM

## 2019-05-07 MED ORDER — ONDANSETRON HCL 4 MG PO TABS: 4.0000 mg | ORAL_TABLET | Freq: Three times a day (TID) | ORAL | Status: DC | PRN

## 2019-05-07 MED ORDER — FENTANYL 75 MCG/HR TD PT72: 1.0000 | MEDICATED_PATCH | TRANSDERMAL | 0 refills | Status: DC

## 2019-05-07 NOTE — Progress Notes (Signed)
Subjective:    Patient ID: [name], female    DOB: August 12, 1955, 64 y.o.   MRN: [id number]  HPI  Due to national recommendations of social distancing because of COVID 44, an audio/video tele-health visit is felt to be the most appropriate encounter for this patient at this time. See MyChart message from today for the patient's consent to a tele-health encounter with [place]. This is a follow up tele-visit via Webex. The patient is at home. MD is at office.   [name] presents for transitional care follow-up following CIR admission for metastatic cancer to the brain and bone.   Her chief complaint is currently severe pain. She was started on Fentanyl 55mcg patches prior to discharge from CIR and was started by Dr. Marin Olp on Toradol and  Hydrocodone. Her Fentanyl patch was also increased to q48H and she received IV morphine in clinic. She felt much better over the weekend with these additions but her husband states that after her bone scan on Monday she has been more fatigued. She also has Gabapentin and Methacarbamol. Location of pain is near the inferior portion of the lateral scapula. She does not have metastases to this region. There was a plan for radiation to her lung tumor but husband says that patient was only able to tolerate one dose due to pain. Radiation has been deferred and plan is to start chemotherapy on 1/14.   She has been having nausea and decreased appetite and was started on Phenergan and Marinol. We also prescribed Zofran for her and she only has one pill left. Her appetite is still diminished. She has been having a BM every 3 days and still feels constipated. Has been taking Lactulose, which helps.    Family History  Problem Relation Age of Onset  . CAD Father        CABG  . Stroke Father   . Cancer Brother        lymphoma   Social History   Socioeconomic History  . Marital status: Married    Spouse name: Not on file  .  Number of children: Not on file  . Years of education: Not on file  . Highest education level: Not on file  Occupational History  . Not on file  Tobacco Use  . Smoking status: Current Every Day Smoker    Packs/day: 0.50    Years: 25.00    Pack years: 12.50    Types: Cigarettes  . Smokeless tobacco: Never Used  Substance and Sexual Activity  . Alcohol use: No    Alcohol/week: 0.0 standard drinks  . Drug use: No  . Sexual activity: Not Currently    Birth control/protection: Post-menopausal  Other Topics Concern  . Not on file  Social History Narrative  . Not on file   Social Determinants of Health   Financial Resource Strain:   . Difficulty of Paying Living Expenses: Not on file  Food Insecurity:   . Worried About Charity fundraiser in the Last Year: Not on file  . Ran Out of Food in the Last Year: Not on file  Transportation Needs:   . Lack of Transportation (Medical): Not on file  . Lack of Transportation (Non-Medical): Not on file  Physical Activity:   . Days of Exercise per Week: Not on file  . Minutes of Exercise per Session: Not on file  Stress:   . Feeling of Stress : Not on file  Social Connections:   .  Frequency of Communication with Friends and Family: Not on file  . Frequency of Social Gatherings with Friends and Family: Not on file  . Attends Religious Services: Not on file  . Active Member of Clubs or Organizations: Not on file  . Attends Archivist Meetings: Not on file  . Marital Status: Not on file   Past Surgical History:  Procedure Laterality Date  . APPLICATION OF CRANIAL NAVIGATION Left 04/05/2019   Procedure: APPLICATION OF CRANIAL NAVIGATION;  Surgeon: Earnie Larsson, MD;  Location: [place];  Service: Neurosurgery;  Laterality: Left;  . CRANIOTOMY Left 04/05/2019   Procedure: LEFT FRONTAL CRANIOTOMY TUMOR EXCISION w/Brain Lab;  Surgeon: Earnie Larsson, MD;  Location: [place];  Service: Neurosurgery;  Laterality: Left;  LEFT FRONTAL CRANIOTOMY  TUMOR EXCISION w/Brain Lab   Past Medical History:  Diagnosis Date  . Bronchitis   . Goals of care, counseling/discussion 04/24/2019  . Hypertension   . Osteoarthritis of metacarpophalangeal (MCP) joint of left thumb   . Pelvic ring fracture ([place]) 09/2016   left   There were no vitals taken for this visit.  Opioid Risk Score:   Fall Risk Score:  `1  Depression screen PHQ 2/9  No flowsheet data found.   Review of Systems Negative except as reported above" +constipation, pain, nausea, fatigue.      Objective:   Physical Exam  Not performed as patient was seen via phone visit.       Assessment & Plan:  1) Cancer-related pain  --Pain is worst over left inferior scapular. She has no pain in this region and suspect pain may be due to tumor compression of intercostal nerves.   --Radiation to tumor could alleviate pain but patient is currently unable to tolerate sessions.  --Will increase Fentanyl patch to 37mcg. If pain is still uncontrolled, we can consider increasing Fentanyl or Dilaudid doses or adding Methadone, for which qTC interval would need to be monitored.  --Discussed the mechanism of action of her multiple pain medications and the side effects she could expect from each. Recommend that she take more prn Methcarbamol and Gabapentin if these are helping her pain given relatively safe side effect profile. She is currently taking Dilaudid 1 tablet every 4-6 hours although she is prescribed 1-2 tablets every 4 hours.   --Current goal is better pain control before we can enhance her mobility with home therapies. Plan for chemotherapy to start in 2 days. Have refilled her Zofran, which she finds helps with her nausea.   --Continue Lactulose BID and Senna-docusate BID for constipation. Can consider increasing doses or adding additional agents if constipation fails to improve with this regimen. Her constipation may also be contributing to her nausea.   Thirty minutes of  virtual patient care time were spent during this visit. All questions were encouraged and answered. Follow up with me in 2 weeks.

## 2019-05-07 NOTE — Progress Notes (Unsigned)
Patient in chemotherapy education class with husband. Discussed side effects of      Carboplatin, Alimta, and Keytrude                  which include but are not limited to myelosuppression, decreased appetite, fatigue, fever, allergic or infusional reaction, mucositis, cardiac toxicity, cough, SOB, altered taste, nausea and vomiting, diarrhea, constipation, elevated LFTs myalgia and arthralgias, hair loss or thinning, rash, skin dryness, nail changes, peripheral neuropathy, discolored urine, delayed wound healing, mental changes (Chemo brain), increased risk of infections, weight loss.  Reviewed infusion room and office policy and procedure and phone numbers 24 hours x 7 days a week.  Reviewed when to call the office with any concerns or problems.  Scientist, clinical (histocompatibility and immunogenetics) given.  Discussed portacath insertion and EMLA cream administration.  Antiemetic protocol and chemotherapy schedule reviewed. Patient verbalized understanding of chemotherapy indications and possible side effects.  Teachback done

## 2019-05-07 NOTE — Progress Notes (Signed)
Patient and her husband in the office today for dietary consult and chemo education.  I visited with them to see how things had been over the weekend. Patient is still dealing with significant pain, but she was much better controlled today. She is ready to move forward with initiation of treatment.  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 Since covid hit last March, they have virtually been out of work. He has concerns with finances. Currently patient is insured, but he's not sure how much longer they can make payments. He also needs help navigating applying for SSI/Disability/Medicare/Medicaid.  Spoke to [employer] and she will reach out to the patient's husband tomorrow to help give him some direction and education regarding his choices.   Referral faxed to [facility] in [place] who can also help patient with application.  Patient and husband also visited with Baxter Flattery, our Financial Specialist.  Husband advised to let this office know as soon as he knows, if patient will lose insurance so that her treatment plan will not be affected. He understood.

## 2019-05-07 NOTE — Progress Notes (Signed)
64 year old female diagnosed with metastatic lung cancer. She is a patient of Dr. Marin Olp.  Current Therapy:         Carboplatinum/Alimta/pembrolizumab-cycle #1-to start on 05/09/2019  Xgeva 120 mg IM q 3 month   PMH includes arthritis and HTN.  Medications include Marinol, Ativan, Protonix, Phenergan, Senokot.  Labs include Albumin 3.4  Height: 5"7". Weight: 132 pounds on Jan 8. UBW: 135 pounds per patient. Weight of 140  Pounds in May 2020. BMI: 20.67.  Patient is here today for nutrition and chemo education. She is with her husband and she presents in a wheelchair. Complains of pain. Reports she thinks she is eating ok, but husband disagrees. He is concerned if she is consuming adequate protein. States BM are improving. They just picked up the lactulose prescription and will begin per MD. Patient appears to have nausea by her description.  She says nothing tastes "right".  Nutrition Diagnosis: Food and Nutrition Diagnosis Related Knowledge Deficit related to metastatic cancer and associated treatments as evidenced by no prior need for nutrition related information.  Intervention: Educated patient to eat small frequent meals and snacks with high protein, high calories foods. Encouraged patient to explore a variety of oral nutrition supplements and try to drink at least 2 bottles daily. Provided 1 complimentary case of Ensure Enlive. Reviewed strategies for improving taste alterations. Reviewed tips on eating with nausea. Fact sheets provided. Questions answered. Teach back method used. Contact information given.  Monitoring, Evaluation, Goals: Patient will tolerate increased calories and protein to minimize weight loss throughout treatment.  Next Visit: To be scheduled as needed. Patient has my contact information for questions.

## 2019-05-08 ENCOUNTER — Other Ambulatory Visit: Payer: Self-pay | Admitting: *Deleted

## 2019-05-08 ENCOUNTER — Ambulatory Visit: Payer: BC Managed Care – PPO

## 2019-05-08 ENCOUNTER — Ambulatory Visit (HOSPITAL_COMMUNITY): Payer: BC Managed Care – PPO

## 2019-05-08 ENCOUNTER — Ambulatory Visit (HOSPITAL_COMMUNITY)
Admission: RE | Admit: 2019-05-08 | Discharge: 2019-05-08 | Disposition: A | Discharge status: Home / Self Care | Payer: BC Managed Care – PPO | Source: Ambulatory Visit | Attending: Hematology & Oncology | Admitting: Hematology & Oncology

## 2019-05-08 ENCOUNTER — Encounter (HOSPITAL_COMMUNITY): Payer: Self-pay

## 2019-05-08 ENCOUNTER — Inpatient Hospital Stay: Payer: BC Managed Care – PPO | Admitting: General Practice

## 2019-05-08 ENCOUNTER — Other Ambulatory Visit: Payer: Self-pay

## 2019-05-08 ENCOUNTER — Other Ambulatory Visit: Payer: Self-pay | Admitting: Hematology & Oncology

## 2019-05-08 DIAGNOSIS — C3491 Malignant neoplasm of unspecified part of right bronchus or lung: Secondary | ICD-10-CM

## 2019-05-08 DIAGNOSIS — M19042 Primary osteoarthritis, left hand: Secondary | ICD-10-CM | POA: Insufficient documentation

## 2019-05-08 DIAGNOSIS — I10 Essential (primary) hypertension: Secondary | ICD-10-CM | POA: Insufficient documentation

## 2019-05-08 DIAGNOSIS — C3490 Malignant neoplasm of unspecified part of unspecified bronchus or lung: Secondary | ICD-10-CM

## 2019-05-08 DIAGNOSIS — C7931 Secondary malignant neoplasm of brain: Secondary | ICD-10-CM | POA: Insufficient documentation

## 2019-05-08 DIAGNOSIS — Z79899 Other long term (current) drug therapy: Secondary | ICD-10-CM | POA: Diagnosis not present

## 2019-05-08 DIAGNOSIS — F17210 Nicotine dependence, cigarettes, uncomplicated: Secondary | ICD-10-CM | POA: Insufficient documentation

## 2019-05-08 DIAGNOSIS — Z452 Encounter for adjustment and management of vascular access device: Secondary | ICD-10-CM | POA: Diagnosis not present

## 2019-05-08 HISTORY — PX: IR IMAGING GUIDED PORT INSERTION: IMG5740

## 2019-05-08 LAB — CBC
HCT: 36.3 % (ref 36.0–46.0)
Hemoglobin: 11.4 g/dL — ABNORMAL LOW (ref 12.0–15.0)
MCH: 30.6 pg (ref 26.0–34.0)
MCHC: 31.4 g/dL (ref 30.0–36.0)
MCV: 97.6 fL (ref 80.0–100.0)
Platelets: 606 10*3/uL — ABNORMAL HIGH (ref 150–400)
RBC: 3.72 MIL/uL — ABNORMAL LOW (ref 3.87–5.11)
RDW: 16.1 % — ABNORMAL HIGH (ref 11.5–15.5)
WBC: 23 10*3/uL — ABNORMAL HIGH (ref 4.0–10.5)
nRBC: 0 % (ref 0.0–0.2)

## 2019-05-08 LAB — PROTIME-INR
INR: 1.1 (ref 0.8–1.2)
Prothrombin Time: 14.1 seconds (ref 11.4–15.2)

## 2019-05-08 MED ORDER — FENTANYL CITRATE (PF) 100 MCG/2ML IJ SOLN
INTRAMUSCULAR | Status: AC | PRN
  Administered 2019-05-08 (×2): 50 ug via INTRAVENOUS

## 2019-05-08 MED ORDER — LIDOCAINE-PRILOCAINE 2.5-2.5 % EX CREA: TOPICAL_CREAM | CUTANEOUS | 3 refills | Status: DC

## 2019-05-08 MED ORDER — HEPARIN SOD (PORK) LOCK FLUSH 100 UNIT/ML IV SOLN
INTRAVENOUS | Status: AC
  Filled 2019-05-08: qty 5

## 2019-05-08 MED ORDER — MIDAZOLAM HCL 2 MG/2ML IJ SOLN
INTRAMUSCULAR | Status: AC | PRN
  Administered 2019-05-08 (×2): 1 mg via INTRAVENOUS

## 2019-05-08 MED ORDER — DIPHENHYDRAMINE HCL 50 MG/ML IJ SOLN
INTRAMUSCULAR | Status: AC | PRN
  Administered 2019-05-08: 25 mg via INTRAVENOUS

## 2019-05-08 MED ORDER — LIDOCAINE-EPINEPHRINE 1 %-1:100000 IJ SOLN
INTRAMUSCULAR | Status: AC
  Filled 2019-05-08: qty 1

## 2019-05-08 MED ORDER — CEFAZOLIN SODIUM-DEXTROSE 2-4 GM/100ML-% IV SOLN: 2.0000 g | Freq: Once | INTRAVENOUS | Status: AC

## 2019-05-08 MED ORDER — CEFAZOLIN SODIUM-DEXTROSE 2-4 GM/100ML-% IV SOLN
INTRAVENOUS | Status: AC
  Administered 2019-05-08: 14:00:00 2 g via INTRAVENOUS
  Filled 2019-05-08: qty 100

## 2019-05-08 MED ORDER — SODIUM CHLORIDE 0.9 % IV SOLN: INTRAVENOUS | Status: DC

## 2019-05-08 MED ORDER — FENTANYL CITRATE (PF) 100 MCG/2ML IJ SOLN
INTRAMUSCULAR | Status: AC
  Filled 2019-05-08: qty 2

## 2019-05-08 MED ORDER — LIDOCAINE-EPINEPHRINE (PF) 1 %-1:200000 IJ SOLN
INTRAMUSCULAR | Status: AC | PRN
  Administered 2019-05-08: 10 mL

## 2019-05-08 MED ORDER — ONDANSETRON HCL 8 MG PO TABS: 8.0000 mg | ORAL_TABLET | Freq: Two times a day (BID) | ORAL | 1 refills | Status: DC | PRN

## 2019-05-08 MED ORDER — FOLIC ACID 1 MG PO TABS: 1.0000 mg | ORAL_TABLET | Freq: Every day | ORAL | 3 refills | Status: DC

## 2019-05-08 MED ORDER — DIPHENHYDRAMINE HCL 50 MG/ML IJ SOLN
INTRAMUSCULAR | Status: AC
  Filled 2019-05-08: qty 1

## 2019-05-08 MED ORDER — HEPARIN SOD (PORK) LOCK FLUSH 100 UNIT/ML IV SOLN
INTRAVENOUS | Status: AC | PRN
  Administered 2019-05-08: 500 [IU] via INTRAVENOUS

## 2019-05-08 MED ORDER — MIDAZOLAM HCL 2 MG/2ML IJ SOLN
INTRAMUSCULAR | Status: AC
  Filled 2019-05-08: qty 4

## 2019-05-08 NOTE — Procedures (Signed)
Interventional Radiology Procedure Note  Procedure: Placement of a right IJ approach single lumen PowerPort.  Tip is positioned at the superior cavoatrial junction and catheter is ready for immediate use.  Complications: No immediate Recommendations:  - Ok to shower tomorrow - Do not submerge for 7 days - Routine line care   Signed,  Dela Sweeny K. Kentavious Michele, MD   

## 2019-05-08 NOTE — Discharge Instructions (Signed)
Implanted Port Insertion, Care After This sheet gives you information about how to care for yourself after your procedure. Your health care provider may also give you more specific instructions. If you have problems or questions, contact your health care provider. What can I expect after the procedure? After the procedure, it is common to have:  Discomfort at the port insertion site.  Bruising on the skin over the port. This should improve over 3-4 days. Follow these instructions at home: [facility] care  After your port is placed, you will get a manufacturer's information card. The card has information about your port. Keep this card with you at all times.  Take care of the port as told by your health care provider. Ask your health care provider if you or a family member can get training for taking care of the port at home. A home health care nurse may also take care of the port.  Make sure to remember what type of port you have. Incision care      Follow instructions from your health care provider about how to take care of your port insertion site. Make sure you: ? Wash your hands with soap and water before and after you change your bandage (dressing). If soap and water are not available, use hand sanitizer. ? Change your dressing as told by your health care provider. ? Leave stitches (sutures), skin glue, or adhesive strips in place. These skin closures may need to stay in place for 2 weeks or longer. If adhesive strip edges start to loosen and curl up, you may trim the loose edges. Do not remove adhesive strips completely unless your health care provider tells you to do that.  Check your port insertion site every day for signs of infection. Check for: ? Redness, swelling, or pain. ? Fluid or blood. ? Warmth. ? Pus or a bad smell. Activity  Return to your normal activities as told by your health care provider. Ask your health care provider what activities are safe for you.  Do not  lift anything that is heavier than 10 lb (4.5 kg), or the limit that you are told, until your health care provider says that it is safe. General instructions  Take over-the-counter and prescription medicines only as told by your health care provider.  Do not take baths, swim, or use a hot tub until your health care provider approves. Ask your health care provider if you may take showers. You may only be allowed to take sponge baths.  Do not drive for 24 hours if you were given a sedative during your procedure.  Wear a medical alert bracelet in case of an emergency. This will tell any health care providers that you have a port.  Keep all follow-up visits as told by your health care provider. This is important. Contact a health care provider if:  You cannot flush your port with saline as directed, or you cannot draw blood from the port.  You have a fever or chills.  You have redness, swelling, or pain around your port insertion site.  You have fluid or blood coming from your port insertion site.  Your port insertion site feels warm to the touch.  You have pus or a bad smell coming from the port insertion site. Get help right away if:  You have chest pain or shortness of breath.  You have bleeding from your port that you cannot control. Summary  Take care of the port as told by your health  care provider. Keep the manufacturer's information card with you at all times.  Change your dressing as told by your health care provider.  Contact a health care provider if you have a fever or chills or if you have redness, swelling, or pain around your port insertion site.  Keep all follow-up visits as told by your health care provider. This information is not intended to replace advice given to you by your health care provider. Make sure you discuss any questions you have with your health care provider. Document Revised: 11/07/2017 Document Reviewed: 11/07/2017 Elsevier Patient Education   [place] After These instructions provide you with information about caring for yourself after your procedure. Your health care provider may also give you more specific instructions. Your treatment has been planned according to current medical practices, but problems sometimes occur. Call your health care provider if you have any problems or questions after your procedure. What can I expect after the procedure? After your procedure, you may:  Feel sleepy for several hours.  Feel clumsy and have poor balance for several hours.  Feel forgetful about what happened after the procedure.  Have poor judgment for several hours.  Feel nauseous or vomit.  Have a sore throat if you had a breathing tube during the procedure. Follow these instructions at home: For at least 24 hours after the procedure:      Have a responsible adult stay with you. It is important to have someone help care for you until you are awake and alert.  Rest as needed.  Do not: ? Participate in activities in which you could fall or become injured. ? Drive. ? Use heavy machinery. ? Drink alcohol. ? Take sleeping pills or medicines that cause drowsiness. ? Make important decisions or sign legal documents. ? Take care of children on your own. Eating and drinking  Follow the diet that is recommended by your health care provider.  If you vomit, drink water, juice, or soup when you can drink without vomiting.  Make sure you have little or no nausea before eating solid foods. General instructions  Take over-the-counter and prescription medicines only as told by your health care provider.  If you have sleep apnea, surgery and certain medicines can increase your risk for breathing problems. Follow instructions from your health care provider about wearing your sleep device: ? Anytime you are sleeping, including during daytime naps. ? While taking prescription pain medicines,  sleeping medicines, or medicines that make you drowsy.  If you smoke, do not smoke without supervision.  Keep all follow-up visits as told by your health care provider. This is important. Contact a health care provider if:  You keep feeling nauseous or you keep vomiting.  You feel light-headed.  You develop a rash.  You have a fever. Get help right away if:  You have trouble breathing. Summary  For several hours after your procedure, you may feel sleepy and have poor judgment.  Have a responsible adult stay with you for at least 24 hours or until you are awake and alert. This information is not intended to replace advice given to you by your health care provider. Make sure you discuss any questions you have with your health care provider. Document Revised: 07/10/2017 Document Reviewed: 08/02/2015 Elsevier Patient Education  [place] not remove dressing until after 3:00 pm  tomorrow, pat area dry when you get out of the shower, you do not have to  put anything else back on the site. You can not use elma cream or lidocaine cream until the skin glue has come completely off ( it usually takes 2 weeks). The cancer center can remove the dressing tomorrow and apply an ice pack for a few minutes, I recommend that you wear a buttom up shirt for when you go to the cancer center.

## 2019-05-08 NOTE — H&P (Signed)
Chief Complaint: Patient was seen in consultation today for port placement at the request of Ennever,Peter R  Referring Physician(s): Ennever,Peter R  Supervising Physician: Jacqulynn Cadet  Patient Status: [facility] - Out-pt  History of Present Illness: [name] is a 64 y.o. female with metastatic lung cancer. She is referred for port placement PMHx, meds, labs, imaging, allergies reviewed. Feels well, no recent fevers, chills, illness. Has been NPO today as directed.    Past Medical History:  Diagnosis Date  . Bronchitis   . Goals of care, counseling/discussion 04/24/2019  . Hypertension   . Osteoarthritis of metacarpophalangeal (MCP) joint of left thumb   . Pelvic ring fracture ([place]) 09/2016   left    Past Surgical History:  Procedure Laterality Date  . APPLICATION OF CRANIAL NAVIGATION Left 04/05/2019   Procedure: APPLICATION OF CRANIAL NAVIGATION;  Surgeon: Earnie Larsson, MD;  Location: [place];  Service: Neurosurgery;  Laterality: Left;  . CRANIOTOMY Left 04/05/2019   Procedure: LEFT FRONTAL CRANIOTOMY TUMOR EXCISION w/Brain Lab;  Surgeon: Earnie Larsson, MD;  Location: [place];  Service: Neurosurgery;  Laterality: Left;  LEFT FRONTAL CRANIOTOMY TUMOR EXCISION w/Brain Lab    Allergies: Patient has no known allergies.  Medications: Prior to Admission medications   Medication Sig Start Date End Date Taking? Authorizing Provider  acetaminophen (TYLENOL) 325 MG tablet Take 2 tablets (650 mg total) by mouth every 6 (six) hours. 04/22/19  Yes Love, Ivan Anchors, PA-C  Baclofen 5 MG TABS Take 5 mg by mouth 3 (three) times daily. 04/22/19  Yes Love, Ivan Anchors, PA-C  dronabinol (MARINOL) 5 MG capsule Take 1 capsule (5 mg total) by mouth 2 (two) times daily before lunch and supper. 04/23/19  Yes Love, Ivan Anchors, PA-C  feeding supplement, ENSURE ENLIVE, (ENSURE ENLIVE) LIQD Take 237 mLs by mouth 2 (two) times daily between meals. 04/11/19  Yes Viona [name] D, NP  fentaNYL  (DURAGESIC) 75 MCG/HR Place 1 patch onto the skin every 3 (three) days. 05/07/19  Yes Raulkar, Clide Deutscher, MD  gabapentin (NEURONTIN) 300 MG capsule Take 1 capsule (300 mg total) by mouth 3 (three) times daily. 04/22/19  Yes Love, Ivan Anchors, PA-C  HYDROmorphone (DILAUDID) 4 MG tablet Take 1-2 tablets (4-8 mg total) by mouth every 4 (four) hours as needed for moderate pain or severe pain. 05/03/19  Yes Ennever, Rudell Cobb, MD  ketorolac (TORADOL) 10 MG tablet Take 1 tablet (10 mg total) by mouth every 6 (six) hours as needed. 05/03/19  Yes Ennever, Rudell Cobb, MD  lactulose (CHRONULAC) 10 GM/15ML solution Take 30 mLs (20 g total) by mouth 2 (two) times daily. 05/06/19  Yes Volanda Napoleon, MD  levETIRAcetam (KEPPRA) 500 MG tablet Take 1 tablet (500 mg total) by mouth 2 (two) times daily. 04/22/19  Yes Love, Ivan Anchors, PA-C  LORazepam (ATIVAN) 1 MG tablet Take 1 tablet (1 mg total) by mouth as needed for anxiety (30 minutes prior to MRI and brain radiation treatment(s)). 04/30/19  Yes Vontae Court, Ashlyn, PA-C  methocarbamol (ROBAXIN) 500 MG tablet Take 1 tablet (500 mg total) by mouth every 6 (six) hours as needed for muscle spasms. 04/22/19  Yes Love, Ivan Anchors, PA-C  pantoprazole (PROTONIX) 40 MG tablet Take 1 tablet (40 mg total) by mouth daily with supper. 04/23/19  Yes Love, Ivan Anchors, PA-C  promethazine (PHENERGAN) 12.5 MG tablet Take 1-2 tablets (12.5-25 mg total) by mouth every 4 (four) hours as needed for refractory nausea / vomiting. 05/06/19  Yes Burney Gauze  R, MD  senna-docusate (SENOKOT-S) 8.6-50 MG tablet Take 1 tablet by mouth 2 (two) times daily. 04/22/19  Yes Love, Ivan Anchors, PA-C  amLODipine (NORVASC) 10 MG tablet TAKE 1 TABLET (10 MG TOTAL) BY MOUTH DAILY. NEED OFFICE VISIT 05/07/19   Pixie Casino, MD  folic acid (FOLVITE) 1 MG tablet Take 1 tablet (1 mg total) by mouth daily. Start 5-7 days before Alimta chemotherapy. Continue until 21 days after Alimta completed. 05/08/19   Volanda Napoleon, MD    HYDROmorphone HCl (DILAUDID PO) Take by mouth as needed.    [provider]  lidocaine-prilocaine (EMLA) cream Apply to affected area once 05/08/19   Ennever, Rudell Cobb, MD  [facility] 4 MG/0.1ML LIQD nasal spray kit 1 spray once. 04/23/19   [provider]  ondansetron (ZOFRAN) 8 MG tablet Take 1 tablet (8 mg total) by mouth 2 (two) times daily as needed (Nausea or vomiting). Start if needed on the third day after chemotherapy. 05/08/19   Volanda Napoleon, MD     Family History  Problem Relation Age of Onset  . CAD Father        CABG  . Stroke Father   . Cancer Brother        lymphoma    Social History   Socioeconomic History  . Marital status: Married    Spouse name: Not on file  . Number of children: Not on file  . Years of education: Not on file  . Highest education level: Not on file  Occupational History  . Not on file  Tobacco Use  . Smoking status: Current Every Day Smoker    Packs/day: 0.50    Years: 25.00    Pack years: 12.50    Types: Cigarettes  . Smokeless tobacco: Never Used  Substance and Sexual Activity  . Alcohol use: No    Alcohol/week: 0.0 standard drinks  . Drug use: No  . Sexual activity: Not Currently    Birth control/protection: Post-menopausal  Other Topics Concern  . Not on file  Social History Narrative  . Not on file   Social Determinants of Health   Financial Resource Strain:   . Difficulty of Paying Living Expenses: Not on file  Food Insecurity:   . Worried About Charity fundraiser in the Last Year: Not on file  . Ran Out of Food in the Last Year: Not on file  Transportation Needs:   . Lack of Transportation (Medical): Not on file  . Lack of Transportation (Non-Medical): Not on file  Physical Activity:   . Days of Exercise per Week: Not on file  . Minutes of Exercise per Session: Not on file  Stress:   . Feeling of Stress : Not on file  Social Connections:   . Frequency of Communication with Friends and Family: Not  on file  . Frequency of Social Gatherings with Friends and Family: Not on file  . Attends Religious Services: Not on file  . Active Member of Clubs or Organizations: Not on file  . Attends Archivist Meetings: Not on file  . Marital Status: Not on file     Review of Systems: A 12 point ROS discussed and pertinent positives are indicated in the HPI above.  All other systems are negative.  Review of Systems  Vital Signs: BP 116/85 (BP Location: Left Arm)   Pulse 92   Temp 97.8 F (36.6 C) (Oral)   Resp 16   SpO2 92%  Physical Exam Constitutional:      Appearance: Normal appearance.  HENT:     Head: Normocephalic.     Mouth/Throat:     Mouth: Mucous membranes are moist.     Pharynx: Oropharynx is clear.  Cardiovascular:     Rate and Rhythm: Normal rate and regular rhythm.     Heart sounds: Normal heart sounds.  Pulmonary:     Effort: Pulmonary effort is normal. No respiratory distress.     Breath sounds: Normal breath sounds. No rhonchi.  Chest:     Chest wall: No tenderness.  Abdominal:     General: Abdomen is flat. There is no distension.     Palpations: Abdomen is soft.  Musculoskeletal:     Cervical back: Normal range of motion and neck supple.  Skin:    General: Skin is warm and dry.  Neurological:     General: No focal deficit present.     Mental Status: She is alert and oriented to person, place, and time.  Psychiatric:        Mood and Affect: Mood normal.        Thought Content: Thought content normal.        Judgment: Judgment normal.       Imaging: NM Bone Scan Whole Body  Result Date: 04/09/2019 CLINICAL DATA:  Non-small-cell lung cancer. Severe left shoulder pain. EXAM: NUCLEAR MEDICINE WHOLE BODY BONE SCAN TECHNIQUE: Whole body anterior and posterior images were obtained approximately 3 hours after intravenous injection of radiopharmaceutical. RADIOPHARMACEUTICALS:  21.3 mCi Technetium-69mMDP IV COMPARISON:  CT 04/02/2019.  Chest x-ray  04/01/2019. FINDINGS: Bilateral renal function and excretion. Focal areas of increased activity noted over multiple ribs bilaterally, L3 vertebral body, and left acetabular region. These findings are consistent with metastatic disease. No focal abnormalities noted about the left shoulder. IMPRESSION: Findings consistent with bony metastatic disease as above. Left shoulder region is unremarkable. Electronically Signed   By: TMarcello Moores Register   On: 04/09/2019 11:21   NM PET Image Initial (PI) Skull Base To Thigh  Result Date: 05/06/2019 CLINICAL DATA:  Initial treatment strategy for metastatic non-small cell lung cancer. EXAM: NUCLEAR MEDICINE PET SKULL BASE TO THIGH TECHNIQUE: 6.6 mCi F-18 FDG was injected intravenously. Full-ring PET imaging was performed from the skull base to thigh after the radiotracer. CT data was obtained and used for attenuation correction and anatomic localization. Fasting blood glucose: 94 mg/dl COMPARISON:  Multiple exams, including CT scans from 04/02/2019 FINDINGS: Mediastinal blood pool activity: SUV max 1.8 Liver activity: SUV max NA NECK: Symmetric glottic activity, maximum SUV 8.1, probably physiologic. Incidental CT findings: Bilateral common carotid atherosclerotic calcification. CHEST: Hypermetabolic bilateral supraclavicular, right paratracheal, left internal mammary, prevascular, subcarinal, right hilar, and right infrahilar adenopathy. A right supraclavicular node measuring 2.2 cm in short axis on image 36/4 (formerly 1.1 cm on 04/02/2027) has a maximum SUV of 19.5. A centrally necrotic right paratracheal lymph node measuring 3.8 cm in short axis on image 49/4 (formerly 0.0 cm) has a maximum SUV 22.5. Bilateral hypermetabolic pulmonary nodules. An index superior segment right lower lobe nodule measures 2.2 by 1.8 cm on image 33/8 (formerly 1.8 by 1.5 cm on 04/02/2019) with maximum SUV 10.4. A left upper lobe nodule measuring 2.0 by 1.6 cm on image 26/8 (formerly 1.7 by 1.5  cm) has a maximum SUV of 15.9. Incidental CT findings: New small to moderate bilateral pleural effusions. These are nonspecific for malignant involvement. Coronary, aortic arch, and branch vessel atherosclerotic vascular disease.  ABDOMEN/PELVIS: A left periaortic node measuring 1.0 cm in short axis on image 113/4 (previously 0.3 cm) has a maximum SUV of 18.6. Scattered activity in bowel, most likely to be physiologic. Subtle activity along the right hepatic lobe capsular margin adjacent to the adrenal gland with maximum SUV of 4.7 compared to background liver activity of 2.9, a small metastatic lesion the liver or conceivably of the for renal gland is raises possibility although no contour abnormality of the right adrenal gland is observed. Incidental CT findings: Aortoiliac atherosclerotic vascular disease. Hypodense lesion anteriorly in the liver for example on image 16/1, not metabolically active, probably benign. SKELETON: Scattered hypermetabolic skeletal lesions in the cervical spine, thoracic spine, ribs, left scapula, lumbar spine, right iliac bone noted. Index destructive lesion of the left anterior sixth rib associated with surrounding soft tissue mass measuring 3.9 cm in short axis (previously 2.9 cm), maximum SUV 21.9. Index metastatic lesion in the L3 vertebral body is primarily lytic with maximum SUV 18.6. Index metastatic lesion in the right upper acetabulum/iliac bone is primarily mildly sclerotic with maximum SUV 8.7. Overall the appearance of degree of involvement seems increased compared to the bone scan of 04/09/2019. Incidental CT findings: Old healed left pelvic fractures. IMPRESSION: 1. Extensive and enlarging metastatic adenopathy in the chest with numerous enlarging metastatic nodules in the lungs. Scattered increased osseous metastatic lesions. New hypermetabolic left periaortic lymph node in the abdomen. 2. New small to moderate bilateral pleural effusions. 3. Aortic Atherosclerosis  (ICD10-I70.0).  Coronary atherosclerosis. Electronically Signed   By: Van Clines M.D.   On: 05/06/2019 11:55   VAS [place] LOWER EXTREMITY VENOUS (DVT)  Result Date: 04/21/2019  Lower Venous Study Indications: Swelling.  Comparison Study: 04/08/2019- age indeterminate DVT right femoral, profunda                   femoral, popliteal, posterior tibial, peroneal veins. Performing Technologist: Maudry Mayhew MHA, RDMS, RVT, RDCS  Examination Guidelines: A complete evaluation includes B-mode imaging, spectral Doppler, color Doppler, and power Doppler as needed of all accessible portions of each vessel. Bilateral testing is considered an integral part of a complete examination. Limited examinations for reoccurring indications may be performed as noted.  +---------+---------------+---------+-----------+----------+--------------+ RIGHT    CompressibilityPhasicitySpontaneityPropertiesThrombus Aging +---------+---------------+---------+-----------+----------+--------------+ CFV      Full           Yes      Yes                                 +---------+---------------+---------+-----------+----------+--------------+ SFJ      Full                                                        +---------+---------------+---------+-----------+----------+--------------+ FV Prox  None                                         Acute          +---------+---------------+---------+-----------+----------+--------------+ FV Mid   None                    No  Acute          +---------+---------------+---------+-----------+----------+--------------+ FV DistalNone                                         Acute          +---------+---------------+---------+-----------+----------+--------------+ PFV      None                                         Acute          +---------+---------------+---------+-----------+----------+--------------+ POP      None           Yes       Yes                  Acute          +---------+---------------+---------+-----------+----------+--------------+ PTV      None                                         Acute          +---------+---------------+---------+-----------+----------+--------------+ PERO     None                                         Acute          +---------+---------------+---------+-----------+----------+--------------+  +----+---------------+---------+-----------+----------+--------------+ LEFTCompressibilityPhasicitySpontaneityPropertiesThrombus Aging +----+---------------+---------+-----------+----------+--------------+ CFV Full           Yes      Yes                                 +----+---------------+---------+-----------+----------+--------------+  Summary: Right: Findings consistent with acute deep vein thrombosis involving the right femoral vein, right proximal profunda vein, right popliteal vein, right posterior tibial veins, and right peroneal veins. No cystic structure found in the popliteal fossa. Left: No evidence of common femoral vein obstruction. When compared to prior study, right lower extremity veins with thrombus are more dilated during this exam with less echogenic components. This is suggestive of acute changes.  *See table(s) above for measurements and observations. Electronically signed by Monica Martinez MD on 04/21/2019 at 1:49:52 PM.    Final     Labs:  CBC: Recent Labs    04/22/19 1048 04/23/19 0559 05/03/19 1203 05/08/19 1226  WBC 10.2 9.6 13.7* 23.0*  HGB 11.1* 11.6* 11.5* 11.4*  HCT 33.9* 35.1* 35.4* 36.3  PLT 393 387 603* 606*    COAGS: Recent Labs    04/01/19 1533  INR 1.0  APTT 27    BMP: Recent Labs    04/15/19 0617 04/22/19 0835 04/22/19 1048 05/03/19 1203  NA 137 137 137 141  K 3.9 4.0 4.0 4.1  CL 102 100 102 107  CO2 '25 27 26 ' 17*  GLUCOSE 97 78 115* 96  BUN 10 7* 7* 6*  CALCIUM 8.4* 8.6* 8.6* 9.1  CREATININE 0.43* 0.51 0.50  0.44  GFRNONAA >60 >60 >60 >60  GFRAA >60 >60 >60 >60    LIVER FUNCTION TESTS: Recent Labs    04/05/19 0214 04/14/19 1013 04/22/19 1048 05/03/19  1203  BILITOT 0.3 0.3 0.3 0.4  AST 11* 28 18 10*  ALT 12 46* 40 9  ALKPHOS 90 122 182* 206*  PROT 6.1* 5.9* 6.3* 6.5  ALBUMIN 2.9* 2.6* 2.4* 3.4*    TUMOR MARKERS: No results for input(s): AFPTM, CEA, CA199, CHROMGRNA in the last 8760 hours.  Assessment and Plan: Metastatic lung cancer For port placement Labs pending Risks and benefits of image guided port-a-catheter placement was discussed with the patient including, but not limited to bleeding, infection, pneumothorax, or fibrin sheath development and need for additional procedures.  All of the patient's questions were answered, patient is agreeable to proceed. Consent signed and in chart.    Thank you for this interesting consult.  I greatly enjoyed meeting Unnamed Hino and look forward to participating in their care.  A copy of this report was sent to the requesting provider on this date.  Electronically Signed: Ascencion Dike, PA-C 05/08/2019, 12:37 PM   I spent a total of 20 minutes in face to face in clinical consultation, greater than 50% of which was counseling/coordinating care for port

## 2019-05-08 NOTE — Progress Notes (Signed)
[name] CSW Progress Notes  Call to family as requested by [facility] nurse navigator - family has questions about applying for disability and Medicaid.  Called, spoke w husband - he requested we reschedule for tomorrow.  Appt rescheduled - also family has my cell number and are encouraged to reach out as needed.  [name], LCSW Clinical Social Worker Phone:  [phone] Cell:  [phone]

## 2019-05-09 ENCOUNTER — Ambulatory Visit: Payer: BC Managed Care – PPO

## 2019-05-09 ENCOUNTER — Other Ambulatory Visit: Payer: Self-pay | Admitting: *Deleted

## 2019-05-09 ENCOUNTER — Inpatient Hospital Stay: Payer: BC Managed Care – PPO

## 2019-05-09 ENCOUNTER — Inpatient Hospital Stay: Payer: BC Managed Care – PPO | Admitting: General Practice

## 2019-05-09 ENCOUNTER — Encounter: Payer: Self-pay | Admitting: *Deleted

## 2019-05-09 ENCOUNTER — Other Ambulatory Visit: Payer: Self-pay

## 2019-05-09 VITALS — BP 109/67 | HR 89 | Resp 20

## 2019-05-09 DIAGNOSIS — Z79899 Other long term (current) drug therapy: Secondary | ICD-10-CM | POA: Diagnosis not present

## 2019-05-09 DIAGNOSIS — Z95828 Presence of other vascular implants and grafts: Secondary | ICD-10-CM

## 2019-05-09 DIAGNOSIS — C7931 Secondary malignant neoplasm of brain: Secondary | ICD-10-CM | POA: Diagnosis not present

## 2019-05-09 DIAGNOSIS — Z791 Long term (current) use of non-steroidal anti-inflammatories (NSAID): Secondary | ICD-10-CM | POA: Diagnosis not present

## 2019-05-09 DIAGNOSIS — C3490 Malignant neoplasm of unspecified part of unspecified bronchus or lung: Secondary | ICD-10-CM

## 2019-05-09 DIAGNOSIS — C3491 Malignant neoplasm of unspecified part of right bronchus or lung: Secondary | ICD-10-CM

## 2019-05-09 DIAGNOSIS — M549 Dorsalgia, unspecified: Secondary | ICD-10-CM | POA: Diagnosis not present

## 2019-05-09 DIAGNOSIS — R531 Weakness: Secondary | ICD-10-CM | POA: Diagnosis not present

## 2019-05-09 DIAGNOSIS — F17210 Nicotine dependence, cigarettes, uncomplicated: Secondary | ICD-10-CM | POA: Diagnosis not present

## 2019-05-09 DIAGNOSIS — Z5111 Encounter for antineoplastic chemotherapy: Secondary | ICD-10-CM | POA: Diagnosis not present

## 2019-05-09 DIAGNOSIS — E860 Dehydration: Secondary | ICD-10-CM

## 2019-05-09 DIAGNOSIS — C3411 Malignant neoplasm of upper lobe, right bronchus or lung: Secondary | ICD-10-CM | POA: Diagnosis not present

## 2019-05-09 DIAGNOSIS — Z452 Encounter for adjustment and management of vascular access device: Secondary | ICD-10-CM | POA: Diagnosis not present

## 2019-05-09 DIAGNOSIS — C7800 Secondary malignant neoplasm of unspecified lung: Secondary | ICD-10-CM | POA: Diagnosis not present

## 2019-05-09 DIAGNOSIS — C7951 Secondary malignant neoplasm of bone: Secondary | ICD-10-CM | POA: Diagnosis not present

## 2019-05-09 LAB — CMP (CANCER CENTER ONLY)
ALT: 5 U/L (ref 0–44)
AST: 10 U/L — ABNORMAL LOW (ref 15–41)
Albumin: 3.2 g/dL — ABNORMAL LOW (ref 3.5–5.0)
Alkaline Phosphatase: 195 U/L — ABNORMAL HIGH (ref 38–126)
Anion gap: 19 — ABNORMAL HIGH (ref 5–15)
BUN: 8 mg/dL (ref 8–23)
CO2: 17 mmol/L — ABNORMAL LOW (ref 22–32)
Calcium: 8.9 mg/dL (ref 8.9–10.3)
Chloride: 105 mmol/L (ref 98–111)
Creatinine: 0.55 mg/dL (ref 0.44–1.00)
GFR, Est AFR Am: >60 mL/min (ref >60)
GFR, Estimated: >60 mL/min (ref >60)
Glucose, Bld: 98 mg/dL (ref 70–99)
Potassium: 4.2 mmol/L (ref 3.5–5.1)
Sodium: 141 mmol/L (ref 135–145)
Total Bilirubin: 0.3 mg/dL (ref 0.3–1.2)
Total Protein: 6 g/dL — ABNORMAL LOW (ref 6.5–8.1)

## 2019-05-09 LAB — CBC WITH DIFFERENTIAL (CANCER CENTER ONLY)
Abs Immature Granulocytes: 0.34 10*3/uL — ABNORMAL HIGH (ref 0.00–0.07)
Basophils Absolute: 0.1 10*3/uL (ref 0.0–0.1)
Basophils Relative: 0 %
Eosinophils Absolute: 0 10*3/uL (ref 0.0–0.5)
Eosinophils Relative: 0 %
HCT: 32 % — ABNORMAL LOW (ref 36.0–46.0)
Hemoglobin: 10.3 g/dL — ABNORMAL LOW (ref 12.0–15.0)
Immature Granulocytes: 2 %
Lymphocytes Relative: 5 %
Lymphs Abs: 1 10*3/uL (ref 0.7–4.0)
MCH: 30.6 pg (ref 26.0–34.0)
MCHC: 32.2 g/dL (ref 30.0–36.0)
MCV: 95 fL (ref 80.0–100.0)
Monocytes Absolute: 1.2 10*3/uL — ABNORMAL HIGH (ref 0.1–1.0)
Monocytes Relative: 6 %
Neutro Abs: 19.2 10*3/uL — ABNORMAL HIGH (ref 1.7–7.7)
Neutrophils Relative %: 87 %
Platelet Count: 552 10*3/uL — ABNORMAL HIGH (ref 150–400)
RBC: 3.37 MIL/uL — ABNORMAL LOW (ref 3.87–5.11)
RDW: 15.9 % — ABNORMAL HIGH (ref 11.5–15.5)
WBC Count: 21.9 10*3/uL — ABNORMAL HIGH (ref 4.0–10.5)
nRBC: 0 % (ref 0.0–0.2)

## 2019-05-09 MED ORDER — SENNOSIDES-DOCUSATE SODIUM 8.6-50 MG PO TABS: 1.0000 | ORAL_TABLET | Freq: Two times a day (BID) | ORAL | Status: DC

## 2019-05-09 MED ORDER — SODIUM CHLORIDE 0.9 % IV SOLN
372.4000 mg | Freq: Once | INTRAVENOUS | Status: AC
  Administered 2019-05-09: 370 mg via INTRAVENOUS
  Filled 2019-05-09: qty 37

## 2019-05-09 MED ORDER — HYDROMORPHONE HCL 4 MG/ML IJ SOLN
4.0000 mg | Freq: Once | INTRAMUSCULAR | Status: AC
  Administered 2019-05-09: 4 mg via INTRAVENOUS

## 2019-05-09 MED ORDER — CYANOCOBALAMIN 1000 MCG/ML IJ SOLN
1000.0000 ug | Freq: Once | INTRAMUSCULAR | Status: AC
  Administered 2019-05-09: 1000 ug via INTRAMUSCULAR

## 2019-05-09 MED ORDER — DEXAMETHASONE SODIUM PHOSPHATE 10 MG/ML IJ SOLN
INTRAMUSCULAR | Status: AC
  Filled 2019-05-09: qty 1

## 2019-05-09 MED ORDER — SODIUM CHLORIDE 0.9 % IV SOLN
460.0000 mg/m2 | Freq: Once | INTRAVENOUS | Status: AC
  Administered 2019-05-09: 800 mg via INTRAVENOUS
  Filled 2019-05-09: qty 20

## 2019-05-09 MED ORDER — DEXAMETHASONE SODIUM PHOSPHATE 10 MG/ML IJ SOLN
10.0000 mg | Freq: Once | INTRAMUSCULAR | Status: AC
  Administered 2019-05-09: 10 mg via INTRAVENOUS

## 2019-05-09 MED ORDER — KETOROLAC TROMETHAMINE 10 MG PO TABS: 10.0000 mg | ORAL_TABLET | Freq: Four times a day (QID) | ORAL | 2 refills | Status: DC | PRN

## 2019-05-09 MED ORDER — SODIUM CHLORIDE 0.9% FLUSH
10.0000 mL | Freq: Once | INTRAVENOUS | Status: AC
  Administered 2019-05-09: 10 mL via INTRAVENOUS
  Filled 2019-05-09: qty 10

## 2019-05-09 MED ORDER — PALONOSETRON HCL INJECTION 0.25 MG/5ML
INTRAVENOUS | Status: AC
  Filled 2019-05-09: qty 5

## 2019-05-09 MED ORDER — METHOCARBAMOL 500 MG PO TABS: 500.0000 mg | ORAL_TABLET | Freq: Four times a day (QID) | ORAL | 0 refills | Status: DC | PRN

## 2019-05-09 MED ORDER — APIXABAN 5 MG PO TABS: 5.0000 mg | ORAL_TABLET | Freq: Two times a day (BID) | ORAL | 1 refills | Status: DC

## 2019-05-09 MED ORDER — HEPARIN SOD (PORK) LOCK FLUSH 100 UNIT/ML IV SOLN
500.0000 [IU] | Freq: Once | INTRAVENOUS | Status: AC | PRN
  Administered 2019-05-09: 500 [IU]
  Filled 2019-05-09: qty 5

## 2019-05-09 MED ORDER — SODIUM CHLORIDE 0.9 % IV SOLN
150.0000 mg | Freq: Once | INTRAVENOUS | Status: AC
  Administered 2019-05-09: 150 mg via INTRAVENOUS
  Filled 2019-05-09: qty 5

## 2019-05-09 MED ORDER — PALONOSETRON HCL INJECTION 0.25 MG/5ML
0.2500 mg | Freq: Once | INTRAVENOUS | Status: AC
  Administered 2019-05-09: 0.25 mg via INTRAVENOUS

## 2019-05-09 MED ORDER — SODIUM CHLORIDE 0.9 % IV SOLN
1000.0000 mL | INTRAVENOUS | Status: AC
  Administered 2019-05-09: 1000 mL via INTRAVENOUS
  Filled 2019-05-09 (×2): qty 1000

## 2019-05-09 MED ORDER — SODIUM CHLORIDE 0.9 % IV SOLN
Freq: Once | INTRAVENOUS | Status: AC
  Filled 2019-05-09: qty 250

## 2019-05-09 MED ORDER — HYDROMORPHONE HCL 4 MG/ML IJ SOLN
INTRAMUSCULAR | Status: AC
  Filled 2019-05-09: qty 1

## 2019-05-09 MED ORDER — SODIUM CHLORIDE 0.9% FLUSH
10.0000 mL | INTRAVENOUS | Status: DC | PRN
  Administered 2019-05-09: 10 mL
  Filled 2019-05-09: qty 10

## 2019-05-09 MED ORDER — SODIUM CHLORIDE 0.9 % IV SOLN
200.0000 mg | Freq: Once | INTRAVENOUS | Status: AC
  Administered 2019-05-09: 200 mg via INTRAVENOUS
  Filled 2019-05-09: qty 8

## 2019-05-09 MED ORDER — CYANOCOBALAMIN 1000 MCG/ML IJ SOLN
INTRAMUSCULAR | Status: AC
  Filled 2019-05-09: qty 1

## 2019-05-09 MED ORDER — SODIUM CHLORIDE 0.9 % IV SOLN: 10.0000 mg | Freq: Once | INTRAVENOUS | Status: DC

## 2019-05-09 MED ORDER — LORAZEPAM 1 MG PO TABS: 1.0000 mg | ORAL_TABLET | Freq: Four times a day (QID) | ORAL | 1 refills | Status: DC | PRN

## 2019-05-09 NOTE — Patient Instructions (Signed)
[facility] Discharge Instructions for Patients Receiving Chemotherapy  Today you received the following chemotherapy agents Keytruda, Carboplatin, Alimta, Vitamin B12  To help prevent nausea and vomiting after your treatment, we encourage you to take your nausea medication   1) Take Phenergan every4 hours as needed for nausea and vomiting. 2) Can Also take Lorazepam (Ativan) for nausea and vomiting every 8 hours as needed.   3) Beginning Sunday, if you are still experiencing nausea and vomiting, you can take Ondansetran (Zofran) twice daily as needed. 4) Begin taking Folic Acid daily which will help with side effects.      If you develop nausea and vomiting that is not controlled by your nausea medication, call the clinic.   BELOW ARE SYMPTOMS THAT SHOULD BE REPORTED IMMEDIATELY:  *FEVER GREATER THAN 100.5 F  *CHILLS WITH OR WITHOUT FEVER  NAUSEA AND VOMITING THAT IS NOT CONTROLLED WITH YOUR NAUSEA MEDICATION  *UNUSUAL SHORTNESS OF BREATH  *UNUSUAL BRUISING OR BLEEDING  TENDERNESS IN MOUTH AND THROAT WITH OR WITHOUT PRESENCE OF ULCERS  *URINARY PROBLEMS  *BOWEL PROBLEMS  UNUSUAL RASH Items with * indicate a potential emergency and should be followed up as soon as possible.  Feel free to call the clinic should you have any questions or concerns. The clinic phone number is [phone].  Please show the [facility] at check-in to the Emergency Department and triage nurse.

## 2019-05-09 NOTE — Progress Notes (Signed)
Reviewed all labwork with Dr. Marin Olp.  Proceed with Chemo today.  Medications reconciled and nausea medications reviewed

## 2019-05-09 NOTE — Patient Instructions (Signed)
Implanted Port Insertion, Care After °This sheet gives you information about how to care for yourself after your procedure. Your health care provider may also give you more specific instructions. If you have problems or questions, contact your health care provider. °What can I expect after the procedure? °After the procedure, it is common to have: °· Discomfort at the port insertion site. °· Bruising on the skin over the port. This should improve over 3-4 days. °Follow these instructions at home: °Port care °· After your port is placed, you will get a manufacturer's information card. The card has information about your port. Keep this card with you at all times. °· Take care of the port as told by your health care provider. Ask your health care provider if you or a family member can get training for taking care of the port at home. A home health care nurse may also take care of the port. °· Make sure to remember what type of port you have. °Incision care ° °  ° °· Follow instructions from your health care provider about how to take care of your port insertion site. Make sure you: °? Wash your hands with soap and water before and after you change your bandage (dressing). If soap and water are not available, use hand sanitizer. °? Change your dressing as told by your health care provider. °? Leave stitches (sutures), skin glue, or adhesive strips in place. These skin closures may need to stay in place for 2 weeks or longer. If adhesive strip edges start to loosen and curl up, you may trim the loose edges. Do not remove adhesive strips completely unless your health care provider tells you to do that. °· Check your port insertion site every day for signs of infection. Check for: °? Redness, swelling, or pain. °? Fluid or blood. °? Warmth. °? Pus or a bad smell. °Activity °· Return to your normal activities as told by your health care provider. Ask your health care provider what activities are safe for you. °· Do not  lift anything that is heavier than 10 lb (4.5 kg), or the limit that you are told, until your health care provider says that it is safe. °General instructions °· Take over-the-counter and prescription medicines only as told by your health care provider. °· Do not take baths, swim, or use a hot tub until your health care provider approves. Ask your health care provider if you may take showers. You may only be allowed to take sponge baths. °· Do not drive for 24 hours if you were given a sedative during your procedure. °· Wear a medical alert bracelet in case of an emergency. This will tell any health care providers that you have a port. °· Keep all follow-up visits as told by your health care provider. This is important. °Contact a health care provider if: °· You cannot flush your port with saline as directed, or you cannot draw blood from the port. °· You have a fever or chills. °· You have redness, swelling, or pain around your port insertion site. °· You have fluid or blood coming from your port insertion site. °· Your port insertion site feels warm to the touch. °· You have pus or a bad smell coming from the port insertion site. °Get help right away if: °· You have chest pain or shortness of breath. °· You have bleeding from your port that you cannot control. °Summary °· Take care of the port as told by your health   care provider. Keep the manufacturer's information card with you at all times. °· Change your dressing as told by your health care provider. °· Contact a health care provider if you have a fever or chills or if you have redness, swelling, or pain around your port insertion site. °· Keep all follow-up visits as told by your health care provider. °This information is not intended to replace advice given to you by your health care provider. Make sure you discuss any questions you have with your health care provider. °Document Revised: 11/07/2017 Document Reviewed: 11/07/2017 °Elsevier Patient Education ©  2020 [employer] ° °

## 2019-05-09 NOTE — Progress Notes (Signed)
[place] CSW Progress Notes  Phone call with Denim Start, patient's husband.  States "I have filed with Social Services to get her disability, Medicaid and Medicare."  DSS caseworker is Sheppard Penton [facility] [id number] Case ID [phone]).  They have received packet of paperwork required for Medicaid application - patient and husband will submit and await decision.  Husband has applied to [employer] for financial help - early retirement has been recommended.  She has not applied for disability - has been referred to [facility] for help in filing for Social Security disability.  They can provide information on best course of action at this time.  Advised husband that Medicare is available either at age 10 or 3 months after determination of disability.  MEdicaid is dependent on DSS screen for financial and disability eligibility.  At this time, family is paying premiums for health insurance acquired through [employer].  Husband reports premiums are burdensome - referred to [employer] to inquire about their COVID premium assistance fund and also provided contact info for Legal Aid which may be able to research any available premium subsidies.  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  Husband also asked for information on how to help aging parents who now reside in their home.  Father is bedbound, mother has dementia and is prone to wandering.  PCP has provided FL2, Medicaid has told husband that it is his responsibility to find suitable placement in SNF.  Advised that PCP can place [facility] referral which might include CSW who can assist w finding Medicaid placement.  Also advised that husband consult Medicare.gov for list of nearby SNFs - can consult their admissions offices to see if they are able to assist w placement.  Caregiving for patient and parents is significantly  burdensome for husband due to multiple medical needs.  Father has been given Medicaid [place]; however, aides are unable to lift him or provide any helpful care for father thus husband declined their services.    Will call husband in 2 weeks to assess progress and will send list of resources.  [name], LCSW Clinical Social Worker Phone:  [phone] Cell:  [phone]

## 2019-05-09 NOTE — Progress Notes (Signed)
Spoke with husband when he dropped off patient for treatment. He is exhausted, and mentally drained, as is the patient. She has a more rough days after her PET and Port Placement. He is concerned that she is not eating or drinking enough. She is also in considerable pain today from her port placement yesterday.  Assured him we would check her kidney function with labs today. She would get some pain management while in treatment and she would get fluids to help with hydration. Explained that hopefully her bad days would decrease now that we were done with work up and she had no other planned testing/scans.   He also had questions about her med list. He wants to know what she should and should not be taking. Reviewed the list with Dr Marin Olp and discontinued all meds which Dr Marin Olp ordered to be dc'd. Will print new medication list today for patient to take home with her.  [facility] needs a signed consent to complete referral to their service. Patient was unable to physically sign paper, due to right sided paralysis, but a verbal consent was obtained with two RNs. Consent faxed to [facility].   Patient tolerated treatment well. She will come back tomorrow for more fluid. She has a calendar of her appointments. She is instructed to focus on symptom management, and adequate food and fluid intake. I answered all he questions. She and her husband know to contact me if they have any issues.

## 2019-05-10 ENCOUNTER — Ambulatory Visit: Payer: BC Managed Care – PPO

## 2019-05-10 ENCOUNTER — Ambulatory Visit: Payer: BC Managed Care – PPO | Admitting: Physical Therapy

## 2019-05-10 ENCOUNTER — Ambulatory Visit: Payer: BC Managed Care – PPO | Admitting: Radiation Oncology

## 2019-05-10 ENCOUNTER — Inpatient Hospital Stay: Payer: BC Managed Care – PPO

## 2019-05-10 ENCOUNTER — Ambulatory Visit: Payer: BC Managed Care – PPO | Admitting: Occupational Therapy

## 2019-05-10 ENCOUNTER — Other Ambulatory Visit: Payer: Self-pay | Admitting: Family

## 2019-05-10 VITALS — BP 131/76 | HR 78 | Temp 97.5°F | Resp 18

## 2019-05-10 DIAGNOSIS — R531 Weakness: Secondary | ICD-10-CM | POA: Diagnosis not present

## 2019-05-10 DIAGNOSIS — C7931 Secondary malignant neoplasm of brain: Secondary | ICD-10-CM

## 2019-05-10 DIAGNOSIS — Z791 Long term (current) use of non-steroidal anti-inflammatories (NSAID): Secondary | ICD-10-CM | POA: Diagnosis not present

## 2019-05-10 DIAGNOSIS — C7951 Secondary malignant neoplasm of bone: Secondary | ICD-10-CM | POA: Diagnosis not present

## 2019-05-10 DIAGNOSIS — C7800 Secondary malignant neoplasm of unspecified lung: Secondary | ICD-10-CM | POA: Diagnosis not present

## 2019-05-10 DIAGNOSIS — C3411 Malignant neoplasm of upper lobe, right bronchus or lung: Secondary | ICD-10-CM | POA: Diagnosis not present

## 2019-05-10 DIAGNOSIS — C3491 Malignant neoplasm of unspecified part of right bronchus or lung: Secondary | ICD-10-CM

## 2019-05-10 DIAGNOSIS — M549 Dorsalgia, unspecified: Secondary | ICD-10-CM | POA: Diagnosis not present

## 2019-05-10 DIAGNOSIS — Z79899 Other long term (current) drug therapy: Secondary | ICD-10-CM | POA: Diagnosis not present

## 2019-05-10 DIAGNOSIS — F17210 Nicotine dependence, cigarettes, uncomplicated: Secondary | ICD-10-CM | POA: Diagnosis not present

## 2019-05-10 DIAGNOSIS — Z5111 Encounter for antineoplastic chemotherapy: Secondary | ICD-10-CM | POA: Diagnosis not present

## 2019-05-10 DIAGNOSIS — Z452 Encounter for adjustment and management of vascular access device: Secondary | ICD-10-CM | POA: Diagnosis not present

## 2019-05-10 LAB — TSH: TSH: 1.15 u[IU]/mL (ref 0.308–3.960)

## 2019-05-10 LAB — T4: T4, Total: 7.1 ug/dL (ref 4.5–12.0)

## 2019-05-10 MED ORDER — HEPARIN SOD (PORK) LOCK FLUSH 100 UNIT/ML IV SOLN
500.0000 [IU] | Freq: Once | INTRAVENOUS | Status: AC
  Administered 2019-05-10: 500 [IU] via INTRAVENOUS
  Filled 2019-05-10: qty 5

## 2019-05-10 MED ORDER — HYDROMORPHONE HCL 4 MG/ML IJ SOLN
INTRAMUSCULAR | Status: AC
  Filled 2019-05-10: qty 1

## 2019-05-10 MED ORDER — SODIUM CHLORIDE 0.9 % IV SOLN
INTRAVENOUS | Status: DC
  Filled 2019-05-10 (×2): qty 250

## 2019-05-10 MED ORDER — SODIUM CHLORIDE 0.9% FLUSH
10.0000 mL | INTRAVENOUS | Status: DC | PRN
  Administered 2019-05-10: 10 mL via INTRAVENOUS
  Filled 2019-05-10: qty 10

## 2019-05-10 MED ORDER — LORAZEPAM 2 MG/ML IJ SOLN
INTRAMUSCULAR | Status: AC
  Filled 2019-05-10: qty 1

## 2019-05-10 MED ORDER — HYDROMORPHONE HCL 4 MG/ML IJ SOLN
4.0000 mg | Freq: Once | INTRAMUSCULAR | Status: AC
  Administered 2019-05-10: 4 mg via INTRAVENOUS

## 2019-05-10 MED ORDER — LORAZEPAM 2 MG/ML IJ SOLN
0.5000 mg | INTRAMUSCULAR | Status: AC
  Administered 2019-05-10: 0.5 mg via INTRAVENOUS

## 2019-05-10 NOTE — Patient Instructions (Signed)
Dehydration, Adult Dehydration is a condition in which there is not enough water or other fluids in the body. This happens when a person loses more fluids than he or she takes in. Important organs, such as the kidneys, brain, and heart, cannot function without a proper amount of fluids. Any loss of fluids from the body can lead to dehydration. Dehydration can be mild, moderate, or severe. It should be treated right away to prevent it from becoming severe. What are the causes? Dehydration may be caused by:  Conditions that cause loss of water or other fluids, such as diarrhea, vomiting, or sweating or urinating a lot.  Not drinking enough fluids, especially when you are ill or doing activities that require a lot of energy.  Other illnesses and conditions, such as fever or infection.  Certain medicines, such as medicines that remove excess fluid from the body (diuretics).  Lack of safe drinking water.  Not being able to get enough water and food. What increases the risk? The following factors may make you more likely to develop this condition:  Having a long-term (chronic) illness that has not been treated properly, such as diabetes, heart disease, or kidney disease.  Being 65 years of age or older.  Having a disability.  Living in a place that is high in altitude, where thinner, drier air causes more fluid loss.  Doing exercises that put stress on your body for a long time (endurance sports). What are the signs or symptoms? Symptoms of dehydration depend on how severe it is. Mild or moderate dehydration  Thirst.  Dry lips or dry mouth.  Dizziness or light-headedness, especially when standing up from a seated position.  Muscle cramps.  Dark urine. Urine may be the color of tea.  Less urine or tears produced than usual.  Headache. Severe dehydration  Changes in skin. Your skin may be cold and clammy, blotchy, or pale. Your skin also may not return to normal after being  lightly pinched and released.  Little or no tears, urine, or sweat.  Changes in vital signs, such as rapid breathing and low blood pressure. Your pulse may be weak or may be faster than 100 beats a minute when you are sitting still.  Other changes, such as: ? Feeling very thirsty. ? Sunken eyes. ? Cold hands and feet. ? Confusion. ? Being very tired (lethargic) or having trouble waking from sleep. ? Short-term weight loss. ? Loss of consciousness. How is this diagnosed? This condition is diagnosed based on your symptoms and a physical exam. You may have blood and urine tests to help confirm the diagnosis. How is this treated? Treatment for this condition depends on how severe it is. Treatment should be started right away. Do not wait until dehydration becomes severe. Severe dehydration is an emergency and needs to be treated in a hospital.  Mild or moderate dehydration can be treated at home. You may be asked to: ? Drink more fluids. ? Drink an oral rehydration solution (ORS). This drink helps restore proper amounts of fluids and salts and minerals in the blood (electrolytes).  Severe dehydration can be treated: ? With IV fluids. ? By correcting abnormal levels of electrolytes. This is often done by giving electrolytes through a tube that is passed through your nose and into your stomach (nasogastric tube, or NG tube). ? By treating the underlying cause of dehydration. Follow these instructions at home: Oral rehydration solution If told by your health care provider, drink an ORS:  Make   an ORS by following instructions on the package.  Start by drinking small amounts, about  cup (120 mL) every 5-10 minutes.  Slowly increase how much you drink until you have taken the amount recommended by your health care provider. Eating and drinking         Drink enough clear fluid to keep your urine pale yellow. If you were told to drink an ORS, finish the ORS first and then start slowly  drinking other clear fluids. Drink fluids such as: ? Water. Do not drink only water. Doing that can lead to hyponatremia, which is having too little salt (sodium) in the body. ? Water from ice chips you suck on. ? Fruit juice that you have added water to (diluted fruit juice). ? Low-calorie sports drinks.  Eat foods that contain a healthy balance of electrolytes, such as bananas, oranges, potatoes, tomatoes, and spinach.  Do not drink alcohol.  Avoid the following: ? Drinks that contain a lot of sugar. These include high-calorie sports drinks, fruit juice that is not diluted, and soda. ? Caffeine. ? Foods that are greasy or contain a lot of fat or sugar. General instructions  Take over-the-counter and prescription medicines only as told by your health care provider.  Do not take sodium tablets. Doing that can lead to having too much sodium in the body (hypernatremia).  Return to your normal activities as told by your health care provider. Ask your health care provider what activities are safe for you.  Keep all follow-up visits as told by your health care provider. This is important. Contact a health care provider if:  You have muscle cramps, pain, or discomfort, such as: ? Pain in your abdomen and the pain gets worse or stays in one area (localizes). ? Stiff neck.  You have a rash.  You are more irritable than usual.  You are sleepier or have a harder time waking than usual.  You feel weak or dizzy.  You feel very thirsty. Get help right away if you have:  Any symptoms of severe dehydration.  Symptoms of vomiting, such as: ? You cannot eat or drink without vomiting. ? Vomiting gets worse or does not go away. ? Vomit includes blood or green matter (bile).  Symptoms that get worse with treatment.  A fever.  A severe headache.  Problems with urination or bowel movements, such as: ? Diarrhea that gets worse or does not go away. ? Blood in your stool (feces). This  may cause stool to look black and tarry. ? Not urinating, or urinating only a small amount of very dark urine, within 6-8 hours.  Trouble breathing. These symptoms may represent a serious problem that is an emergency. Do not wait to see if the symptoms will go away. Get medical help right away. Call your local emergency services (911 in the [place] Do not drive yourself to the hospital. Summary  Dehydration is a condition in which there is not enough water or other fluids in the body. This happens when a person loses more fluids than he or she takes in.  Treatment for this condition depends on how severe it is. Treatment should be started right away. Do not wait until dehydration becomes severe.  Drink enough clear fluid to keep your urine pale yellow. If you were told to drink an oral rehydration solution (ORS), finish the ORS first and then start slowly drinking other clear fluids.  Take over-the-counter and prescription medicines only as told by your health care   provider.  Get help right away if you have any symptoms of severe dehydration. This information is not intended to replace advice given to you by your health care provider. Make sure you discuss any questions you have with your health care provider. Document Revised: 11/22/2018 Document Reviewed: 11/22/2018 Elsevier Patient Education  2020 Elsevier Inc[employer]   

## 2019-05-12 ENCOUNTER — Other Ambulatory Visit: Payer: Self-pay | Admitting: Urology

## 2019-05-13 ENCOUNTER — Encounter (HOSPITAL_COMMUNITY): Payer: BC Managed Care – PPO

## 2019-05-13 ENCOUNTER — Inpatient Hospital Stay (HOSPITAL_COMMUNITY): Payer: BC Managed Care – PPO

## 2019-05-13 ENCOUNTER — Emergency Department (HOSPITAL_COMMUNITY): Payer: BC Managed Care – PPO

## 2019-05-13 ENCOUNTER — Ambulatory Visit: Payer: BC Managed Care – PPO

## 2019-05-13 ENCOUNTER — Telehealth: Payer: Self-pay | Admitting: *Deleted

## 2019-05-13 ENCOUNTER — Encounter (HOSPITAL_COMMUNITY): Payer: Self-pay | Admitting: Emergency Medicine

## 2019-05-13 ENCOUNTER — Inpatient Hospital Stay (HOSPITAL_COMMUNITY)
Admission: EM | Admit: 2019-05-13 | Discharge: 2019-05-17 | DRG: 871 | Disposition: A | Discharge status: Home / Self Care | Payer: BC Managed Care – PPO | Attending: Internal Medicine | Admitting: Internal Medicine

## 2019-05-13 ENCOUNTER — Encounter: Payer: Self-pay | Admitting: Radiation Oncology

## 2019-05-13 ENCOUNTER — Other Ambulatory Visit: Payer: Self-pay

## 2019-05-13 DIAGNOSIS — Z7901 Long term (current) use of anticoagulants: Secondary | ICD-10-CM

## 2019-05-13 DIAGNOSIS — R399 Unspecified symptoms and signs involving the genitourinary system: Secondary | ICD-10-CM | POA: Diagnosis not present

## 2019-05-13 DIAGNOSIS — R109 Unspecified abdominal pain: Secondary | ICD-10-CM | POA: Diagnosis not present

## 2019-05-13 DIAGNOSIS — N10 Acute tubulo-interstitial nephritis: Secondary | ICD-10-CM | POA: Diagnosis present

## 2019-05-13 DIAGNOSIS — F17210 Nicotine dependence, cigarettes, uncomplicated: Secondary | ICD-10-CM | POA: Diagnosis present

## 2019-05-13 DIAGNOSIS — Z79899 Other long term (current) drug therapy: Secondary | ICD-10-CM

## 2019-05-13 DIAGNOSIS — C3490 Malignant neoplasm of unspecified part of unspecified bronchus or lung: Secondary | ICD-10-CM

## 2019-05-13 DIAGNOSIS — G893 Neoplasm related pain (acute) (chronic): Secondary | ICD-10-CM | POA: Diagnosis present

## 2019-05-13 DIAGNOSIS — J90 Pleural effusion, not elsewhere classified: Secondary | ICD-10-CM | POA: Diagnosis not present

## 2019-05-13 DIAGNOSIS — R197 Diarrhea, unspecified: Secondary | ICD-10-CM | POA: Diagnosis not present

## 2019-05-13 DIAGNOSIS — E872 Acidosis, unspecified: Secondary | ICD-10-CM | POA: Diagnosis present

## 2019-05-13 DIAGNOSIS — K219 Gastro-esophageal reflux disease without esophagitis: Secondary | ICD-10-CM | POA: Diagnosis present

## 2019-05-13 DIAGNOSIS — T451X5A Adverse effect of antineoplastic and immunosuppressive drugs, initial encounter: Secondary | ICD-10-CM | POA: Diagnosis present

## 2019-05-13 DIAGNOSIS — M1812 Unilateral primary osteoarthritis of first carpometacarpal joint, left hand: Secondary | ICD-10-CM | POA: Diagnosis present

## 2019-05-13 DIAGNOSIS — I2699 Other pulmonary embolism without acute cor pulmonale: Secondary | ICD-10-CM | POA: Diagnosis present

## 2019-05-13 DIAGNOSIS — R079 Chest pain, unspecified: Secondary | ICD-10-CM

## 2019-05-13 DIAGNOSIS — E860 Dehydration: Secondary | ICD-10-CM | POA: Diagnosis not present

## 2019-05-13 DIAGNOSIS — Z9221 Personal history of antineoplastic chemotherapy: Secondary | ICD-10-CM | POA: Diagnosis not present

## 2019-05-13 DIAGNOSIS — R1111 Vomiting without nausea: Secondary | ICD-10-CM | POA: Diagnosis not present

## 2019-05-13 DIAGNOSIS — K5900 Constipation, unspecified: Secondary | ICD-10-CM | POA: Diagnosis present

## 2019-05-13 DIAGNOSIS — A419 Sepsis, unspecified organism: Principal | ICD-10-CM | POA: Diagnosis present

## 2019-05-13 DIAGNOSIS — Z79891 Long term (current) use of opiate analgesic: Secondary | ICD-10-CM

## 2019-05-13 DIAGNOSIS — F419 Anxiety disorder, unspecified: Secondary | ICD-10-CM | POA: Diagnosis present

## 2019-05-13 DIAGNOSIS — I313 Pericardial effusion (noninflammatory): Secondary | ICD-10-CM | POA: Diagnosis not present

## 2019-05-13 DIAGNOSIS — C3491 Malignant neoplasm of unspecified part of right bronchus or lung: Secondary | ICD-10-CM | POA: Diagnosis present

## 2019-05-13 DIAGNOSIS — E46 Unspecified protein-calorie malnutrition: Secondary | ICD-10-CM | POA: Diagnosis present

## 2019-05-13 DIAGNOSIS — Z807 Family history of other malignant neoplasms of lymphoid, hematopoietic and related tissues: Secondary | ICD-10-CM

## 2019-05-13 DIAGNOSIS — C3411 Malignant neoplasm of upper lobe, right bronchus or lung: Secondary | ICD-10-CM | POA: Diagnosis not present

## 2019-05-13 DIAGNOSIS — D6481 Anemia due to antineoplastic chemotherapy: Secondary | ICD-10-CM | POA: Diagnosis present

## 2019-05-13 DIAGNOSIS — C7951 Secondary malignant neoplasm of bone: Secondary | ICD-10-CM | POA: Diagnosis not present

## 2019-05-13 DIAGNOSIS — R0602 Shortness of breath: Secondary | ICD-10-CM | POA: Diagnosis not present

## 2019-05-13 DIAGNOSIS — S0990XA Unspecified injury of head, initial encounter: Secondary | ICD-10-CM | POA: Diagnosis not present

## 2019-05-13 DIAGNOSIS — E876 Hypokalemia: Secondary | ICD-10-CM | POA: Diagnosis not present

## 2019-05-13 DIAGNOSIS — C7931 Secondary malignant neoplasm of brain: Secondary | ICD-10-CM | POA: Diagnosis not present

## 2019-05-13 DIAGNOSIS — I10 Essential (primary) hypertension: Secondary | ICD-10-CM | POA: Diagnosis present

## 2019-05-13 DIAGNOSIS — R350 Frequency of micturition: Secondary | ICD-10-CM | POA: Diagnosis not present

## 2019-05-13 DIAGNOSIS — I82409 Acute embolism and thrombosis of unspecified deep veins of unspecified lower extremity: Secondary | ICD-10-CM | POA: Diagnosis not present

## 2019-05-13 DIAGNOSIS — Z515 Encounter for palliative care: Secondary | ICD-10-CM | POA: Diagnosis not present

## 2019-05-13 DIAGNOSIS — Z20822 Contact with and (suspected) exposure to covid-19: Secondary | ICD-10-CM | POA: Diagnosis not present

## 2019-05-13 DIAGNOSIS — Z823 Family history of stroke: Secondary | ICD-10-CM

## 2019-05-13 DIAGNOSIS — Z923 Personal history of irradiation: Secondary | ICD-10-CM

## 2019-05-13 DIAGNOSIS — Z8249 Family history of ischemic heart disease and other diseases of the circulatory system: Secondary | ICD-10-CM

## 2019-05-13 DIAGNOSIS — R110 Nausea: Secondary | ICD-10-CM | POA: Diagnosis not present

## 2019-05-13 DIAGNOSIS — Z86718 Personal history of other venous thrombosis and embolism: Secondary | ICD-10-CM

## 2019-05-13 DIAGNOSIS — R5383 Other fatigue: Secondary | ICD-10-CM | POA: Diagnosis not present

## 2019-05-13 LAB — CBC
HCT: 26.9 % — ABNORMAL LOW (ref 36.0–46.0)
Hemoglobin: 8.6 g/dL — ABNORMAL LOW (ref 12.0–15.0)
MCH: 31 pg (ref 26.0–34.0)
MCHC: 32 g/dL (ref 30.0–36.0)
MCV: 97.1 fL (ref 80.0–100.0)
Platelets: 302 10*3/uL (ref 150–400)
RBC: 2.77 MIL/uL — ABNORMAL LOW (ref 3.87–5.11)
RDW: 16.4 % — ABNORMAL HIGH (ref 11.5–15.5)
WBC: 14.6 10*3/uL — ABNORMAL HIGH (ref 4.0–10.5)
nRBC: 0 % (ref 0.0–0.2)

## 2019-05-13 LAB — COMPREHENSIVE METABOLIC PANEL
ALT: 17 U/L (ref 0–44)
AST: 25 U/L (ref 15–41)
Albumin: 2.5 g/dL — ABNORMAL LOW (ref 3.5–5.0)
Alkaline Phosphatase: 143 U/L — ABNORMAL HIGH (ref 38–126)
Anion gap: 16 — ABNORMAL HIGH (ref 5–15)
BUN: <5 mg/dL — ABNORMAL LOW (ref 8–23)
CO2: 15 mmol/L — ABNORMAL LOW (ref 22–32)
Calcium: 7.7 mg/dL — ABNORMAL LOW (ref 8.9–10.3)
Chloride: 107 mmol/L (ref 98–111)
Creatinine, Ser: 0.42 mg/dL — ABNORMAL LOW (ref 0.44–1.00)
GFR calc Af Amer: >60 mL/min (ref >60)
GFR calc non Af Amer: >60 mL/min (ref >60)
Glucose, Bld: 93 mg/dL (ref 70–99)
Potassium: 3.3 mmol/L — ABNORMAL LOW (ref 3.5–5.1)
Sodium: 138 mmol/L (ref 135–145)
Total Bilirubin: 1.3 mg/dL — ABNORMAL HIGH (ref 0.3–1.2)
Total Protein: 5.7 g/dL — ABNORMAL LOW (ref 6.5–8.1)

## 2019-05-13 LAB — URINALYSIS, ROUTINE W REFLEX MICROSCOPIC
Bilirubin Urine: NEGATIVE
Glucose, UA: NEGATIVE mg/dL
Ketones, ur: 80 mg/dL — AB
Leukocytes,Ua: NEGATIVE
Nitrite: NEGATIVE
Protein, ur: 30 mg/dL — AB
Specific Gravity, Urine: 1.017 (ref 1.005–1.030)
pH: 5 (ref 5.0–8.0)

## 2019-05-13 LAB — LACTIC ACID, PLASMA
Lactic Acid, Venous: 1 mmol/L (ref 0.5–1.9)
Lactic Acid, Venous: 1 mmol/L (ref 0.5–1.9)

## 2019-05-13 LAB — TROPONIN I (HIGH SENSITIVITY)
Troponin I (High Sensitivity): 48 ng/L — ABNORMAL HIGH (ref <18)
Troponin I (High Sensitivity): 51 ng/L — ABNORMAL HIGH (ref <18)

## 2019-05-13 LAB — DIFFERENTIAL
Abs Immature Granulocytes: 0.09 10*3/uL — ABNORMAL HIGH (ref 0.00–0.07)
Basophils Absolute: 0 10*3/uL (ref 0.0–0.1)
Basophils Relative: 0 %
Eosinophils Absolute: 0 10*3/uL (ref 0.0–0.5)
Eosinophils Relative: 0 %
Immature Granulocytes: 1 %
Lymphocytes Relative: 4 %
Lymphs Abs: 0.6 10*3/uL — ABNORMAL LOW (ref 0.7–4.0)
Monocytes Absolute: 0.1 10*3/uL (ref 0.1–1.0)
Monocytes Relative: 1 %
Neutro Abs: 13.7 10*3/uL — ABNORMAL HIGH (ref 1.7–7.7)
Neutrophils Relative %: 94 %

## 2019-05-13 LAB — ECHOCARDIOGRAM LIMITED
Height: 67 in
Weight: 2144 oz

## 2019-05-13 LAB — HEMOGLOBIN AND HEMATOCRIT, BLOOD
HCT: 28 % — ABNORMAL LOW (ref 36.0–46.0)
Hemoglobin: 8.8 g/dL — ABNORMAL LOW (ref 12.0–15.0)

## 2019-05-13 LAB — LIPASE, BLOOD: Lipase: 18 U/L (ref 11–51)

## 2019-05-13 LAB — POC OCCULT BLOOD, ED: Fecal Occult Bld: POSITIVE — AB

## 2019-05-13 LAB — ABO/RH: ABO/RH(D): B NEG

## 2019-05-13 MED ORDER — HYDROMORPHONE HCL 2 MG/ML IJ SOLN
2.0000 mg | INTRAMUSCULAR | Status: DC | PRN
  Administered 2019-05-13 – 2019-05-14 (×4): 2 mg via INTRAVENOUS
  Filled 2019-05-13 (×4): qty 1

## 2019-05-13 MED ORDER — SODIUM CHLORIDE 0.9% FLUSH: 9.0000 mL | INTRAVENOUS | Status: DC | PRN

## 2019-05-13 MED ORDER — HYDROMORPHONE HCL 1 MG/ML IJ SOLN
0.5000 mg | Freq: Once | INTRAMUSCULAR | Status: AC
  Administered 2019-05-13: 0.5 mg via INTRAVENOUS
  Filled 2019-05-13: qty 1

## 2019-05-13 MED ORDER — ENOXAPARIN SODIUM 60 MG/0.6ML ~~LOC~~ SOLN
1.0000 mg/kg | Freq: Two times a day (BID) | SUBCUTANEOUS | Status: DC
  Administered 2019-05-13: 60 mg via SUBCUTANEOUS
  Filled 2019-05-13: qty 0.6

## 2019-05-13 MED ORDER — METHOCARBAMOL 500 MG PO TABS
500.0000 mg | ORAL_TABLET | Freq: Four times a day (QID) | ORAL | Status: DC | PRN
  Administered 2019-05-15 – 2019-05-16 (×2): 500 mg via ORAL
  Filled 2019-05-13 (×2): qty 1

## 2019-05-13 MED ORDER — DIPHENHYDRAMINE HCL 25 MG PO CAPS: 25.0000 mg | ORAL_CAPSULE | ORAL | Status: DC | PRN

## 2019-05-13 MED ORDER — ACETAMINOPHEN 325 MG PO TABS: 650.0000 mg | ORAL_TABLET | Freq: Four times a day (QID) | ORAL | Status: DC | PRN

## 2019-05-13 MED ORDER — SODIUM CHLORIDE 0.9 % IV SOLN
2.0000 g | INTRAVENOUS | Status: DC
  Administered 2019-05-14: 2 g via INTRAVENOUS
  Filled 2019-05-13 (×2): qty 20

## 2019-05-13 MED ORDER — NALOXONE HCL 0.4 MG/ML IJ SOLN: 0.4000 mg | INTRAMUSCULAR | Status: DC | PRN

## 2019-05-13 MED ORDER — ONDANSETRON HCL 4 MG PO TABS: 8.0000 mg | ORAL_TABLET | Freq: Three times a day (TID) | ORAL | Status: DC | PRN

## 2019-05-13 MED ORDER — FOLIC ACID 1 MG PO TABS
1.0000 mg | ORAL_TABLET | Freq: Every day | ORAL | Status: DC
  Administered 2019-05-14 – 2019-05-17 (×4): 1 mg via ORAL
  Filled 2019-05-13 (×5): qty 1

## 2019-05-13 MED ORDER — DIPHENHYDRAMINE HCL 12.5 MG/5ML PO ELIX: 12.5000 mg | ORAL_SOLUTION | Freq: Four times a day (QID) | ORAL | Status: DC | PRN

## 2019-05-13 MED ORDER — ONDANSETRON HCL 4 MG/2ML IJ SOLN
4.0000 mg | Freq: Four times a day (QID) | INTRAMUSCULAR | Status: DC | PRN
  Administered 2019-05-13: 4 mg via INTRAVENOUS
  Filled 2019-05-13: qty 2

## 2019-05-13 MED ORDER — SODIUM CHLORIDE 0.9 % IV BOLUS
1000.0000 mL | Freq: Once | INTRAVENOUS | Status: AC
  Administered 2019-05-13: 1000 mL via INTRAVENOUS

## 2019-05-13 MED ORDER — OXYMETAZOLINE HCL 0.05 % NA SOLN
1.0000 | Freq: Two times a day (BID) | NASAL | Status: DC | PRN
  Filled 2019-05-13: qty 15

## 2019-05-13 MED ORDER — PANTOPRAZOLE SODIUM 40 MG PO TBEC
40.0000 mg | DELAYED_RELEASE_TABLET | Freq: Every day | ORAL | Status: DC
  Administered 2019-05-15 – 2019-05-16 (×2): 40 mg via ORAL
  Filled 2019-05-13 (×3): qty 1

## 2019-05-13 MED ORDER — IOHEXOL 350 MG/ML SOLN
100.0000 mL | Freq: Once | INTRAVENOUS | Status: AC | PRN
  Administered 2019-05-13: 100 mL via INTRAVENOUS

## 2019-05-13 MED ORDER — POTASSIUM CHLORIDE CRYS ER 20 MEQ PO TBCR
40.0000 meq | EXTENDED_RELEASE_TABLET | ORAL | Status: AC
  Administered 2019-05-13: 40 meq via ORAL
  Filled 2019-05-13: qty 2

## 2019-05-13 MED ORDER — LIDOCAINE-PRILOCAINE 2.5-2.5 % EX CREA
1.0000 "application " | TOPICAL_CREAM | CUTANEOUS | Status: DC | PRN
  Filled 2019-05-13: qty 5

## 2019-05-13 MED ORDER — ONDANSETRON HCL 4 MG PO TABS: 4.0000 mg | ORAL_TABLET | Freq: Four times a day (QID) | ORAL | Status: DC | PRN

## 2019-05-13 MED ORDER — DIPHENHYDRAMINE HCL 50 MG/ML IJ SOLN: 12.5000 mg | Freq: Four times a day (QID) | INTRAMUSCULAR | Status: DC | PRN

## 2019-05-13 MED ORDER — ALBUTEROL SULFATE (2.5 MG/3ML) 0.083% IN NEBU: 2.5000 mg | INHALATION_SOLUTION | Freq: Four times a day (QID) | RESPIRATORY_TRACT | Status: DC | PRN

## 2019-05-13 MED ORDER — HYDROMORPHONE 1 MG/ML IV SOLN
INTRAVENOUS | Status: DC
  Administered 2019-05-14: 30 mg via INTRAVENOUS
  Filled 2019-05-13 (×7): qty 30

## 2019-05-13 MED ORDER — FENTANYL 75 MCG/HR TD PT72
1.0000 | MEDICATED_PATCH | TRANSDERMAL | Status: DC
  Administered 2019-05-13: 1 via TRANSDERMAL
  Filled 2019-05-13: qty 1

## 2019-05-13 MED ORDER — HEPARIN (PORCINE) 25000 UT/250ML-% IV SOLN
1150.0000 [IU]/h | INTRAVENOUS | Status: DC
  Administered 2019-05-14: 1050 [IU]/h via INTRAVENOUS
  Administered 2019-05-14: 1150 [IU]/h via INTRAVENOUS
  Filled 2019-05-13 (×2): qty 250

## 2019-05-13 MED ORDER — SODIUM CHLORIDE 0.9 % IV SOLN
8.0000 mg | Freq: Three times a day (TID) | INTRAVENOUS | Status: DC | PRN
  Administered 2019-05-14: 8 mg via INTRAVENOUS
  Filled 2019-05-13 (×2): qty 4

## 2019-05-13 MED ORDER — SODIUM CHLORIDE 0.9 % IV SOLN
2.0000 g | Freq: Once | INTRAVENOUS | Status: AC
  Administered 2019-05-13: 2 g via INTRAVENOUS
  Filled 2019-05-13: qty 20

## 2019-05-13 MED ORDER — HYDROMORPHONE 1 MG/ML IV SOLN: INTRAVENOUS | Status: DC

## 2019-05-13 MED ORDER — ACETAMINOPHEN 650 MG RE SUPP: 650.0000 mg | Freq: Four times a day (QID) | RECTAL | Status: DC | PRN

## 2019-05-13 MED ORDER — PROCHLORPERAZINE EDISYLATE 10 MG/2ML IJ SOLN
10.0000 mg | Freq: Four times a day (QID) | INTRAMUSCULAR | Status: DC | PRN
  Administered 2019-05-13 – 2019-05-17 (×5): 10 mg via INTRAVENOUS
  Filled 2019-05-13 (×5): qty 2

## 2019-05-13 MED ORDER — SODIUM CHLORIDE (PF) 0.9 % IJ SOLN
INTRAMUSCULAR | Status: AC
  Filled 2019-05-13: qty 50

## 2019-05-13 MED ORDER — LACTULOSE 10 GM/15ML PO SOLN: 20.0000 g | Freq: Two times a day (BID) | ORAL | Status: DC | PRN

## 2019-05-13 MED ORDER — LEVETIRACETAM 500 MG PO TABS
500.0000 mg | ORAL_TABLET | Freq: Two times a day (BID) | ORAL | Status: DC
  Administered 2019-05-14 – 2019-05-15 (×5): 500 mg via ORAL
  Filled 2019-05-13 (×6): qty 1

## 2019-05-13 MED ORDER — APIXABAN 5 MG PO TABS: 5.0000 mg | ORAL_TABLET | Freq: Two times a day (BID) | ORAL | Status: DC

## 2019-05-13 MED ORDER — DRONABINOL 5 MG PO CAPS
5.0000 mg | ORAL_CAPSULE | Freq: Two times a day (BID) | ORAL | Status: DC
  Administered 2019-05-15 – 2019-05-17 (×5): 5 mg via ORAL
  Filled 2019-05-13 (×6): qty 1

## 2019-05-13 MED ORDER — ONDANSETRON HCL 4 MG/2ML IJ SOLN
4.0000 mg | Freq: Once | INTRAMUSCULAR | Status: AC
  Administered 2019-05-13: 4 mg via INTRAVENOUS
  Filled 2019-05-13: qty 2

## 2019-05-13 MED ORDER — HYDROMORPHONE HCL 2 MG/ML IJ SOLN: 2.0000 mg | INTRAMUSCULAR | Status: DC | PRN

## 2019-05-13 MED ORDER — SODIUM CHLORIDE 0.9% FLUSH
3.0000 mL | Freq: Two times a day (BID) | INTRAVENOUS | Status: DC
  Administered 2019-05-14 – 2019-05-15 (×2): 3 mL via INTRAVENOUS

## 2019-05-13 MED ORDER — LORAZEPAM 1 MG PO TABS
1.0000 mg | ORAL_TABLET | Freq: Four times a day (QID) | ORAL | Status: DC | PRN
  Administered 2019-05-16: 1 mg via ORAL
  Filled 2019-05-13 (×2): qty 1

## 2019-05-13 MED ORDER — POLYVINYL ALCOHOL 1.4 % OP SOLN
1.0000 [drp] | OPHTHALMIC | Status: DC | PRN
  Filled 2019-05-13: qty 15

## 2019-05-13 MED ORDER — SODIUM CHLORIDE 0.9 % IV SOLN: 25.0000 mg | INTRAVENOUS | Status: DC | PRN

## 2019-05-13 NOTE — Progress Notes (Signed)
ANTICOAGULATION CONSULT NOTE - Initial Consult  Pharmacy Consult for transitioning from Lovenox to IV heparin Indication: new PE, recent DVT (04/08/2019)  No Known Allergies  Patient Measurements: Height: 5\' 7"  (170.2 cm) Weight: 134 lb (60.8 kg) IBW/kg (Calculated) : 61.6  Vital Signs: Temp: 98 F (36.7 C) (01/18 0324) Temp Source: Oral (01/18 0324) BP: 120/69 (01/18 0945) Pulse Rate: 92 (01/18 0945)  Labs: Recent Labs    05/13/19 0407 05/13/19 0505 05/13/19 0655 05/13/19 0815  HGB  --  8.6*  --  8.8*  HCT  --  26.9*  --  28.0*  PLT  --  302  --   --   CREATININE 0.42*  --   --   --   TROPONINIHS 48*  --  51*  --     Estimated Creatinine Clearance: 69.1 mL/min (A) (by C-G formula based on SCr of 0.42 mg/dL (L)).   Medical History: Past Medical History:  Diagnosis Date  . Bronchitis   . Goals of care, counseling/discussion 04/24/2019  . Hypertension   . Osteoarthritis of metacarpophalangeal (MCP) joint of left thumb   . Pelvic ring fracture ([place]) 09/2016   left   Assessment: 1 y/oF with adenocarcinoma of the right lung with bone and brain metastases on Apixaban 5mg  PO BID PTA for hx of RLE DVT in 03/2019 who presents to [facility] ED on 05/13/2019 with urinary frequency and chest pain. CTa + for PE. Pharmacy consulted for Enoxaparin dosing. Last dose of Apixaban PTA reported as 05/12/2019 at 1800. CBC: Hgb low/decreased to 8.6, Pltc 302K. SCr 0.42 with CrCl > 30 ml/min.   Goal of Therapy:  Treatment of VTE Monitor platelets by anticoagulation protocol: Yes   Plan:   Patient just got Lovenox full dose 1mg /kg this AM around 0900  Transitioning to IV heparin but will not need to start until next Lovenox dose would have been due at 2100.   At 2100, start IV heparin at rate of 1050 units/hr with NO bolus  Will check heparin level and aPTT 6 hours after starting IV heparin   Monitor renal function, CBC at least q72h, and for s/sx of bleeding   Adrian Saran,  PharmD, BCPS 05/13/2019 10:20 AM

## 2019-05-13 NOTE — Telephone Encounter (Signed)
Message received from patient's husband to inform Dr. Marin Olp that patient is in the ER at [facility] and that he would like to speak with Dr. Marin Olp.  Dr. Marin Olp notified and K. Curcio NP notified of patient's location per order of Dr. Marin Olp.

## 2019-05-13 NOTE — ED Notes (Signed)
Bladder scan 26ml. 

## 2019-05-13 NOTE — Progress Notes (Addendum)
HEMATOLOGY-ONCOLOGY PROGRESS NOTE  SUBJECTIVE: The patient presented to the emergency room with complaints of fatigue and increased urinary frequency.  She was also having mild low back discomfort and chest discomfort.  Due to her chest discomfort and tachycardia, a CT angiogram of the chest was performed which showed a pulmonary embolus in the lingular and left lower lobe pulmonary arteries.  Patient has been on Eliquis for her recent diagnosis of DVT.  He also had a CT of the abdomen pelvis due to her increased urinary frequency and was found to have findings suspicious for cystitis and right pyelonephritis.  CT also showed a destructive lytic lesion within the L3 vertebral body which was unchanged from the prior PET scan.  The patient recently started on chemotherapy for her non-small cell lung cancer with carboplatin, Alimta, and Keytruda.  First cycle was given on 05/09/2019.  The patient's husband is at the bedside at the time of my visit.  The patient has not been having any fevers or chills at home.  They have not noticed any hematuria.  She is complaining of back pain as well as chest discomfort.  She does have some shortness of breath.  She has also been having nausea since her last chemotherapy.  Oncology History  Adenocarcinoma of lung, stage 4, right ([place])  04/11/2019 Initial Diagnosis   Adenocarcinoma of lung, stage 4, right ([place])   05/09/2019 -  Chemotherapy   The patient had palonosetron (ALOXI) injection 0.25 mg, 0.25 mg, Intravenous,  Once, 1 of 4 cycles Administration: 0.25 mg (05/09/2019) PEMEtrexed (ALIMTA) 800 mg in sodium chloride 0.9 % 100 mL chemo infusion, 460 mg/m2 = 750 mg (90 % of original dose 500 mg/m2), Intravenous,  Once, 1 of 6 cycles Dose modification: 450 mg/m2 (90 % of original dose 500 mg/m2, Cycle 1, Reason: Provider Judgment) Administration: 800 mg (05/09/2019) CARBOplatin (PARAPLATIN) 370 mg in sodium chloride 0.9 % 250 mL chemo infusion, 370 mg (100 % of  original dose 372.4 mg), Intravenous,  Once, 1 of 4 cycles Dose modification:   (original dose 372.4 mg, Cycle 1) Administration: 370 mg (05/09/2019) fosaprepitant (EMEND) 150 mg in sodium chloride 0.9 % 145 mL IVPB, 150 mg, Intravenous,  Once, 1 of 4 cycles Administration: 150 mg (05/09/2019) pembrolizumab (KEYTRUDA) 200 mg in sodium chloride 0.9 % 50 mL chemo infusion, 200 mg, Intravenous, Once, 1 of 6 cycles Administration: 200 mg (05/09/2019)  for chemotherapy treatment.    Lung cancer metastatic to brain ([place])  04/13/2019 Initial Diagnosis   Lung cancer metastatic to brain ([place])   05/09/2019 -  Chemotherapy   The patient had palonosetron (ALOXI) injection 0.25 mg, 0.25 mg, Intravenous,  Once, 1 of 4 cycles Administration: 0.25 mg (05/09/2019) PEMEtrexed (ALIMTA) 800 mg in sodium chloride 0.9 % 100 mL chemo infusion, 460 mg/m2 = 750 mg (90 % of original dose 500 mg/m2), Intravenous,  Once, 1 of 6 cycles Dose modification: 450 mg/m2 (90 % of original dose 500 mg/m2, Cycle 1, Reason: Provider Judgment) Administration: 800 mg (05/09/2019) CARBOplatin (PARAPLATIN) 370 mg in sodium chloride 0.9 % 250 mL chemo infusion, 370 mg (100 % of original dose 372.4 mg), Intravenous,  Once, 1 of 4 cycles Dose modification:   (original dose 372.4 mg, Cycle 1) Administration: 370 mg (05/09/2019) fosaprepitant (EMEND) 150 mg in sodium chloride 0.9 % 145 mL IVPB, 150 mg, Intravenous,  Once, 1 of 4 cycles Administration: 150 mg (05/09/2019) pembrolizumab (KEYTRUDA) 200 mg in sodium chloride 0.9 % 50 mL chemo infusion, 200  mg, Intravenous, Once, 1 of 6 cycles Administration: 200 mg (05/09/2019)  for chemotherapy treatment.       REVIEW OF SYSTEMS:   As noted in the HPI.  Additional review of systems noncontributory.  I have reviewed the past medical history, past surgical history, social history and family history with the patient and they are unchanged from previous note.   PHYSICAL EXAMINATION: ECOG  PERFORMANCE STATUS: 2 - Symptomatic, <50% confined to bed  Vitals:   05/13/19 0900 05/13/19 0945  BP: 120/69 120/69  Pulse:  92  Resp: 17   Temp:    SpO2:  93%   Filed Weights   05/13/19 0324  Weight: 134 lb (60.8 kg)    Intake/Output from previous day: No intake/output data recorded.  GENERAL: Alert, appears uncomfortable  NECK: supple, thyroid normal size, non-tender, without nodularity LUNGS: clear to auscultation and percussion with normal breathing effort HEART: regular rate & rhythm and no murmurs and no lower extremity edema ABDOMEN:abdomen soft, non-tender and normal bowel sounds Musculoskeletal:no cyanosis of digits and no clubbing  NEURO: alert & oriented x 3 with fluent speech, no focal motor/sensory deficits  LABORATORY DATA:  I have reviewed the data as listed CMP Latest Ref Rng & Units 05/13/2019 05/09/2019 05/03/2019  Glucose 70 - 99 mg/dL 93 98 96  BUN 8 - 23 mg/dL <5(L) 8 6(L)  Creatinine 0.44 - 1.00 mg/dL 0.42(L) 0.55 0.44  Sodium 135 - 145 mmol/L 138 141 141  Potassium 3.5 - 5.1 mmol/L 3.3(L) 4.2 4.1  Chloride 98 - 111 mmol/L 107 105 107  CO2 22 - 32 mmol/L 15(L) 17(L) 17(L)  Calcium 8.9 - 10.3 mg/dL 7.7(L) 8.9 9.1  Total Protein 6.5 - 8.1 g/dL 5.7(L) 6.0(L) 6.5  Total Bilirubin 0.3 - 1.2 mg/dL 1.3(H) 0.3 0.4  Alkaline Phos 38 - 126 U/L 143(H) 195(H) 206(H)  AST 15 - 41 U/L 25 10(L) 10(L)  ALT 0 - 44 U/L 17 5 9     Lab Results  Component Value Date   WBC 14.6 (H) 05/13/2019   HGB 8.8 (L) 05/13/2019   HCT 28.0 (L) 05/13/2019   MCV 97.1 05/13/2019   PLT 302 05/13/2019   NEUTROABS 13.7 (H) 05/13/2019    CT Angio Chest PE W and/or Wo Contrast  Result Date: 05/13/2019 CLINICAL DATA:  Shortness of breath. Recent diagnosis of metastatic non-small cell cancer. EXAM: CT ANGIOGRAPHY CHEST WITH CONTRAST TECHNIQUE: Multidetector CT imaging of the chest was performed using the standard protocol during bolus administration of intravenous contrast. Multiplanar  CT image reconstructions and MIPs were obtained to evaluate the vascular anatomy. CONTRAST:  149mL OMNIPAQUE IOHEXOL 350 MG/ML SOLN COMPARISON:  PET CT 7 days ago FINDINGS: Cardiovascular: Positive for acute pulmonary embolus with filling defect in the lingular and left lower lobar pulmonary arteries. Thromboembolic burden is small. Aortic atherosclerosis without dissection. Heart size normal. Minimal pericardial fluid. Right chest port in place, tip in the atrial caval junction. Mediastinum/Nodes: Multifocal bulky mediastinal and right hilar adenopathy, most prominent involving the right lower paratracheal region. No a conglomerate measures up to 3.6 cm, not significantly changed from prior PET allowing for differences in technique. Right supraclavicular adenopathy with node measuring 2.1 cm. There also enlarged left supraclavicular nodes. No esophageal wall thickening. Lungs/Pleura: Small bilateral pleural effusions have improved from prior PET. Right pleural effusion extends into the fissure. Multiple pulmonary nodules throughout both lungs. Dominant lesion in the right upper lobe extension the hilum towards the apex with surrounding spiculations. Some of these  nodules are more conspicuous given decrease in pleural fluid from prior. Otherwise no significant change in nodule since prior PET. Trachea and mainstem bronchi are patent. Upper Abdomen: Assessed on concurrent abdominal CT, reported separately. Musculoskeletal: Osseous metastatic disease, assessed on recent PET. This includes destructive lesion involving left anterior sixth rib. Many of the region of hypermetabolism on PET have faint or no CT correlate. Review of the MIP images confirms the above findings. IMPRESSION: 1. Positive for acute pulmonary embolus in the lingular and left lower lobar pulmonary arteries. Thromboembolic burden is small. 2. Small bilateral pleural effusions, improved since PET CT 1 week ago. 3. Recent diagnosis of non-small cell  lung cancer with recent staging PET scan 1 week ago. Multiple pulmonary nodules, multifocal thoracic adenopathy, and osseous metastatic disease. Overall no significant change. 4.  Aortic Atherosclerosis (ICD10-I70.0). Critical Value/emergent results were called by telephone at the time of interpretation on 05/13/2019 at 6:51 am to Dr [facility] [place] , who verbally acknowledged these results. Electronically Signed   By: Keith Rake M.D.   On: 05/13/2019 06:51   CT ABDOMEN PELVIS W CONTRAST  Result Date: 05/13/2019 CLINICAL DATA:  Abdominal pain. Urinary frequency. Right-sided pain. EXAM: CT ABDOMEN AND PELVIS WITH CONTRAST TECHNIQUE: Multidetector CT imaging of the abdomen and pelvis was performed using the standard protocol following bolus administration of intravenous contrast. CONTRAST:  167mL OMNIPAQUE IOHEXOL 350 MG/ML SOLN COMPARISON:  Recent PET CT 05/06/2019, abdominal CT 04/02/2019 FINDINGS: Lower chest: Assessed on concurrent chest CT. Small pleural effusions. Hepatobiliary: Small low-density lesions in the liver consistent with cyst. There is no additional vague hypodensity in the right lobe measuring 16 mm, previously with lobulated enhancement, likely hemangioma. No new liver lesion. Gallbladder physiologically distended, no calcified stone. No biliary dilatation. Pancreas: No ductal dilatation or inflammation. Spleen: Calcified granuloma. Normal in size. Adrenals/Urinary Tract: Mild left adrenal thickening again seen. Normal right adrenal gland. Heterogeneous enhancement of the right kidney with patchy nephrogram suspicious for pyelonephritis. No hydronephrosis. No renal fluid collection. Small low-density lesion in the upper left kidney is similar to prior exam. Mild urinary bladder wall thickening. No urolithiasis. Stomach/Bowel: Stomach partially distended. No gastric wall thickening. No bowel obstruction or inflammation. Appendix not confidently visualized, no evidence of appendicitis.  Vascular/Lymphatic: Aortic atherosclerosis. No aneurysm. 10 mm left periaortic node unchanged from recent PET, series 4, image 37. No new adenopathy. Reproductive: Small uterine fibroid. No adnexal mass. Other: No ascites. No free air. Musculoskeletal: Destructive lytic lesion within L3 vertebral body, unchanged from recent PET. No new osseous abnormality. IMPRESSION: 1. Findings suspicious for cystitis and right pyelonephritis. 2. Destructive lytic lesion within L3 vertebral body, unchanged from recent PET. Electronically Signed   By: Keith Rake M.D.   On: 05/13/2019 07:00   NM PET Image Initial (PI) Skull Base To Thigh  Result Date: 05/06/2019 CLINICAL DATA:  Initial treatment strategy for metastatic non-small cell lung cancer. EXAM: NUCLEAR MEDICINE PET SKULL BASE TO THIGH TECHNIQUE: 6.6 mCi F-18 FDG was injected intravenously. Full-ring PET imaging was performed from the skull base to thigh after the radiotracer. CT data was obtained and used for attenuation correction and anatomic localization. Fasting blood glucose: 94 mg/dl COMPARISON:  Multiple exams, including CT scans from 04/02/2019 FINDINGS: Mediastinal blood pool activity: SUV max 1.8 Liver activity: SUV max NA NECK: Symmetric glottic activity, maximum SUV 8.1, probably physiologic. Incidental CT findings: Bilateral common carotid atherosclerotic calcification. CHEST: Hypermetabolic bilateral supraclavicular, right paratracheal, left internal mammary, prevascular, subcarinal, right hilar, and right infrahilar  adenopathy. A right supraclavicular node measuring 2.2 cm in short axis on image 36/4 (formerly 1.1 cm on 04/02/2027) has a maximum SUV of 19.5. A centrally necrotic right paratracheal lymph node measuring 3.8 cm in short axis on image 49/4 (formerly 0.0 cm) has a maximum SUV 22.5. Bilateral hypermetabolic pulmonary nodules. An index superior segment right lower lobe nodule measures 2.2 by 1.8 cm on image 33/8 (formerly 1.8 by 1.5 cm on  04/02/2019) with maximum SUV 10.4. A left upper lobe nodule measuring 2.0 by 1.6 cm on image 26/8 (formerly 1.7 by 1.5 cm) has a maximum SUV of 15.9. Incidental CT findings: New small to moderate bilateral pleural effusions. These are nonspecific for malignant involvement. Coronary, aortic arch, and branch vessel atherosclerotic vascular disease. ABDOMEN/PELVIS: A left periaortic node measuring 1.0 cm in short axis on image 113/4 (previously 0.3 cm) has a maximum SUV of 18.6. Scattered activity in bowel, most likely to be physiologic. Subtle activity along the right hepatic lobe capsular margin adjacent to the adrenal gland with maximum SUV of 4.7 compared to background liver activity of 2.9, a small metastatic lesion the liver or conceivably of the for renal gland is raises possibility although no contour abnormality of the right adrenal gland is observed. Incidental CT findings: Aortoiliac atherosclerotic vascular disease. Hypodense lesion anteriorly in the liver for example on image 87/5, not metabolically active, probably benign. SKELETON: Scattered hypermetabolic skeletal lesions in the cervical spine, thoracic spine, ribs, left scapula, lumbar spine, right iliac bone noted. Index destructive lesion of the left anterior sixth rib associated with surrounding soft tissue mass measuring 3.9 cm in short axis (previously 2.9 cm), maximum SUV 21.9. Index metastatic lesion in the L3 vertebral body is primarily lytic with maximum SUV 18.6. Index metastatic lesion in the right upper acetabulum/iliac bone is primarily mildly sclerotic with maximum SUV 8.7. Overall the appearance of degree of involvement seems increased compared to the bone scan of 04/09/2019. Incidental CT findings: Old healed left pelvic fractures. IMPRESSION: 1. Extensive and enlarging metastatic adenopathy in the chest with numerous enlarging metastatic nodules in the lungs. Scattered increased osseous metastatic lesions. New hypermetabolic left  periaortic lymph node in the abdomen. 2. New small to moderate bilateral pleural effusions. 3. Aortic Atherosclerosis (ICD10-I70.0).  Coronary atherosclerosis. Electronically Signed   By: Van Clines M.D.   On: 05/06/2019 11:55   DG Chest Port 1 View  Result Date: 05/13/2019 CLINICAL DATA:  Chest pain. Recent diagnosis of lung cancer. EXAM: PORTABLE CHEST 1 VIEW COMPARISON:  Radiograph 04/01/2019. PET CT 05/06/2019 reviewed FINDINGS: Patient is rotated. Right chest port with tip in the lower SVC. No pneumothorax. Right pleural effusion. Left pleural effusion on PET CT not well seen. Right paratracheal soft tissue density, likely related to underlying pulmonary mass. Patchy opacity in the left perihilar lung. No pneumothorax. Bone lesions on prior PET are not well demonstrated radiographically. IMPRESSION: 1. Right pleural effusion. Left pleural effusion on recent PET not visualized radiographically. Right paratracheal soft tissue density likely related to known pulmonary mass. 2. Patchy opacities in the left perihilar lung likely correspond pulmonary nodules on PET. 3. Right chest port in place, tip in the SVC. Electronically Signed   By: Keith Rake M.D.   On: 05/13/2019 05:01   IR IMAGING GUIDED PORT INSERTION  Result Date: 05/08/2019 INDICATION: 64 year old female with stage IV metastatic lung cancer including brain metastases. She presents for port catheter placement. EXAM: IMPLANTED PORT A CATH PLACEMENT WITH ULTRASOUND AND FLUOROSCOPIC GUIDANCE MEDICATIONS: 2 g  Ancef and 25 mg Benadryl; The antibiotic was administered within an appropriate time interval prior to skin puncture. ANESTHESIA/SEDATION: Versed 2 mg IV; Fentanyl 100 mcg IV; Moderate Sedation Time:  18 minutes The patient was continuously monitored during the procedure by the interventional radiology nurse under my direct supervision. FLUOROSCOPY TIME:  0 minutes, 12 seconds (2 mGy) COMPLICATIONS: None immediate. PROCEDURE: The  right neck and chest was prepped with chlorhexidine, and draped in the usual sterile fashion using maximum barrier technique (cap and mask, sterile gown, sterile gloves, large sterile sheet, hand hygiene and cutaneous antiseptic). Local anesthesia was attained by infiltration with 1% lidocaine with epinephrine. Ultrasound demonstrated patency of the right internal jugular vein, and this was documented with an image. Under real-time ultrasound guidance, this vein was accessed with a 21 gauge micropuncture needle and image documentation was performed. A small dermatotomy was made at the access site with an 11 scalpel. A 0.018" wire was advanced into the SVC and the access needle exchanged for a 11F micropuncture vascular sheath. The 0.018" wire was then removed and a 0.035" wire advanced into the IVC. An appropriate location for the subcutaneous reservoir was selected below the clavicle and an incision was made through the skin and underlying soft tissues. The subcutaneous tissues were then dissected using a combination of blunt and sharp surgical technique and a pocket was formed. A single lumen low-profile power injectable portacatheter was then tunneled through the subcutaneous tissues from the pocket to the dermatotomy and the port reservoir placed within the subcutaneous pocket. The venous access site was then serially dilated and a peel away vascular sheath placed over the wire. The wire was removed and the port catheter advanced into position under fluoroscopic guidance. The catheter tip is positioned in the superior cavoatrial junction. This was documented with a spot image. The portacatheter was then tested and found to flush and aspirate well. The port was flushed with saline followed by 100 units/mL heparinized saline. The pocket was then closed in two layers using first subdermal inverted interrupted absorbable sutures followed by a running subcuticular suture. The epidermis was then sealed with Dermabond.  The dermatotomy at the venous access site was also closed with Dermabond. IMPRESSION: Successful placement of a right IJ approach Power Port with ultrasound and fluoroscopic guidance. The catheter is ready for use. Electronically Signed   By: Jacqulynn Cadet M.D.   On: 05/08/2019 15:58   VAS [place] LOWER EXTREMITY VENOUS (DVT)  Result Date: 04/21/2019  Lower Venous Study Indications: Swelling.  Comparison Study: 04/08/2019- age indeterminate DVT right femoral, profunda                   femoral, popliteal, posterior tibial, peroneal veins. Performing Technologist: Maudry Mayhew MHA, RDMS, RVT, RDCS  Examination Guidelines: A complete evaluation includes B-mode imaging, spectral Doppler, color Doppler, and power Doppler as needed of all accessible portions of each vessel. Bilateral testing is considered an integral part of a complete examination. Limited examinations for reoccurring indications may be performed as noted.  +---------+---------------+---------+-----------+----------+--------------+ RIGHT    CompressibilityPhasicitySpontaneityPropertiesThrombus Aging +---------+---------------+---------+-----------+----------+--------------+ CFV      Full           Yes      Yes                                 +---------+---------------+---------+-----------+----------+--------------+ SFJ      Full                                                        +---------+---------------+---------+-----------+----------+--------------+  FV Prox  None                                         Acute          +---------+---------------+---------+-----------+----------+--------------+ FV Mid   None                    No                   Acute          +---------+---------------+---------+-----------+----------+--------------+ FV DistalNone                                         Acute          +---------+---------------+---------+-----------+----------+--------------+ PFV      None                                          Acute          +---------+---------------+---------+-----------+----------+--------------+ POP      None           Yes      Yes                  Acute          +---------+---------------+---------+-----------+----------+--------------+ PTV      None                                         Acute          +---------+---------------+---------+-----------+----------+--------------+ PERO     None                                         Acute          +---------+---------------+---------+-----------+----------+--------------+  +----+---------------+---------+-----------+----------+--------------+ LEFTCompressibilityPhasicitySpontaneityPropertiesThrombus Aging +----+---------------+---------+-----------+----------+--------------+ CFV Full           Yes      Yes                                 +----+---------------+---------+-----------+----------+--------------+  Summary: Right: Findings consistent with acute deep vein thrombosis involving the right femoral vein, right proximal profunda vein, right popliteal vein, right posterior tibial veins, and right peroneal veins. No cystic structure found in the popliteal fossa. Left: No evidence of common femoral vein obstruction. When compared to prior study, right lower extremity veins with thrombus are more dilated during this exam with less echogenic components. This is suggestive of acute changes.  *See table(s) above for measurements and observations. Electronically signed by Monica Martinez MD on 04/21/2019 at 1:49:52 PM.    Final     ASSESSMENT AND PLAN: 1.  Metastatic non-small cell lung cancer, adenocarcinoma 2.  Recent diagnosis of DVT and now with pulmonary embolus 3.  Cancer related pain 4.  Leukocytosis 5.  Anemia due to recent chemotherapy and noted to have heme positive stools 6.  Hypokalemia 7.  Protein calorie malnutrition 8.  CT  scan findings concerning for cystitis and right  pyelonephritis  -The patient is status post 1 cycle of chemotherapy.  She developed fatigue and nausea following her chemotherapy.  We will adjust antiemetics. -The patient is noted to have a new diagnosis of a PE.  She was previously on Eliquis.  Recommend heparin drip and will transition to Lovenox prior to discharge. -The patient has pain that is not fully controlled.  Recommend continuation of the Duragesic patch and agree with Dilaudid PCA.  Palliative care consult is currently pending and we would ask their assistance in helping [place] control her pain. -The patient has leukocytosis likely due to underlying pyelonephritis/cystitis.  Blood cultures and urine culture are pending.  IV antibiotics per hospitalist. -The patient's hemoglobin is slowly reaching down.  This is likely related to her recent chemotherapy but she is also noted to have heme positive stools.  Recommend PRBC transfusion for hemoglobin less than 8 or active bleeding.  We may need to consider asking for GI consult for further evaluation of her heme positive stools. -The patient has mild hypokalemia.  Replete per hospitalist. -Continue Marinol for poor appetite.   LOS: 0 days   Mikey Bussing, DNP, AGPCNP-BC, AOCNP 05/13/19   ADDENDUM: I examined Ms. Gren.  Unfortunate, she is still in the emergency room.  At this point, I am not sure that she really is going be a candidate for any further treatment.  I know she is only had one treatment of chemotherapy.  Her performance status just is not getting better.  She now has a pulmonary embolism.  She was off Eliquis for a few days because of the Port-A-Cath placement.  I am not sure that this was a real reason for her to now have the pulmonary embolism.  I would keep her on heparin for right now.  I really think that she probably would benefit and have more response to Lovenox.  I realize that this is an injection.  She seems a little bit confused this morning.  Again I will know  if this is from the pain medication that she is on.  I think palliative care would be a very good idea to try to help with the pain issues.  Maybe, I will talk to radiation oncology and see if they can do a single fraction to this left 6th rib where she seems to be having her pain.  I will give her a dose of Zometa.  I had a long talk with her husband on the phone yesterday.  He certainly would be amenable to her not having any more chemotherapy.  I told him that it really would be nice to give her a chance with chemotherapy.  I would think that she should respond.  However, this is all about quality of life.  She has stage IV disease.  We are not going to cure her.  If she cannot be functional, I do think she would want to be subjected to treatment that might affect her quality of life in a negative way.  This morning, her labs show her albumin to be 2.7.  I am going to check a prealbumin on her.  Her creatinine is 0.44.  Her hemoglobin is 9.3.  She is not neutropenic.  Cultures are pending.  She is on antibiotics.  Hopefully, she will have a room sometime today.  I appreciate everybody's help with Ms. Shackett.  We will certainly follow along very closely.  Lattie Haw, MD  [place] [phone]

## 2019-05-13 NOTE — Progress Notes (Addendum)
ANTICOAGULATION CONSULT NOTE - Initial Consult  Pharmacy Consult for Enoxaparin  Indication: new PE, recent DVT (04/08/2019)  No Known Allergies  Patient Measurements: Height: 5\' 7"  (170.2 cm) Weight: 134 lb (60.8 kg) IBW/kg (Calculated) : 61.6  Vital Signs: Temp: 98 F (36.7 C) (01/18 0324) Temp Source: Oral (01/18 0324) BP: 146/98 (01/18 0625) Pulse Rate: 96 (01/18 0625)  Labs: Recent Labs    05/13/19 0407 05/13/19 0505  HGB  --  8.6*  HCT  --  26.9*  PLT  --  302  CREATININE 0.42*  --   TROPONINIHS 48*  --     Estimated Creatinine Clearance: 69.1 mL/min (A) (by C-G formula based on SCr of 0.42 mg/dL (L)).   Medical History: Past Medical History:  Diagnosis Date  . Bronchitis   . Goals of care, counseling/discussion 04/24/2019  . Hypertension   . Osteoarthritis of metacarpophalangeal (MCP) joint of left thumb   . Pelvic ring fracture ([place]) 09/2016   left   Assessment: 39 y/oF with adenocarcinoma of the right lung with bone and brain metastases on Apixaban 5mg  PO BID PTA for hx of RLE DVT in 03/2019 who presents to [facility] ED on 05/13/2019 with urinary frequency and chest pain. CTa + for PE. Pharmacy consulted for Enoxaparin dosing. Last dose of Apixaban PTA reported as 05/12/2019 at 1800. CBC: Hgb low/decreased to 8.6, Pltc 302K. SCr 0.42 with CrCl > 30 ml/min.   Goal of Therapy:  Treatment of VTE Monitor platelets by anticoagulation protocol: Yes   Plan:  Enoxaparin 60mg  (1mg /kg) SQ q12h Monitor renal function, CBC at least q72h, and for s/sx of bleeding   Lindell Spar, PharmD, BCPS Clinical Pharmacist  05/13/2019,7:37 AM

## 2019-05-13 NOTE — Progress Notes (Signed)
  Echocardiogram 2D Echocardiogram has been performed. Patient in severe chest and back pain. Echo changed to limited.  [name] 05/13/2019, 11:36 AM

## 2019-05-13 NOTE — ED Notes (Signed)
Patient constantly complaining about pain. Each time asked, different location. Pain now to right hip.

## 2019-05-13 NOTE — Progress Notes (Signed)
  Radiation Oncology         [phone]) [phone] ________________________________  Name: [name] MRN: [id number]  Date: 05/13/2019  DOB: 1955/10/20  End of Treatment Note  Diagnosis:   64 yo woman with a solitary brain metastasis and painful left scapula/rib metastases from adenocarcinoma of the right upper lung  Indication for treatment:  Palliation       Radiation treatment dates:   05/02/19  Site/dose:    1.  The left scapula was treated to 3 Gy in a single fraction of a planned 10 fractions 2.  The left ribs were treated to 3 Gy in a single fraction of a planned 10 fractions  Beams/energy:      1.  The left scapula was treated using a 3 field technique 2.  The left ribs were treated using a 3 field technique  Narrative: The patient did not tolerate radiation treatment very well.   She had baseline severe left shoulder pain.  As a result, she was not comfortable on the treatment table.  We had planned 10 radiation fractions to the left shoulder to alleviate this pain, but, unfortunately she elected to discontinue radiation.  We had also planned post-op SRS s/p resection of her solitary brain met, and, this was also cancelled due to patient pain.  Plan: The patient has completed radiation treatment. We would be happy to offer palliative radiation later in her course if needed.  The planned radiation was intended to alleviate the pain.  Radiation therapy has an 80-90% likelihood to reduce pain and 50% likelihood to eliminate it completely. ________________________________  Sheral Apley Tammi Klippel, M.D.

## 2019-05-13 NOTE — ED Notes (Signed)
Orders received from hospitalist for PRN pain management while pt holding in the ED.

## 2019-05-13 NOTE — ED Provider Notes (Signed)
[facility] DEPT Provider Note   CSN: [id number] Arrival date & time: 05/13/19  0304     History Chief Complaint  Patient presents with  . Urinary Frequency  . Chest Pain    [name] is a 64 y.o. female.  64 yo F with a cc of fatigue and increased urinary frequency.  Going on since about noon yesterday.  Patient started urinating every hour and then started going every couple minutes.  Patient had some dysuria with that.  No fevers.  Having some mild low back discomfort.  Patient has recently begun chemotherapy and had a treatment done 4 days ago.  No significant complication other than dehydration as of yet.  Complaining of some chest pain as well.  Hard for her to describe.  The history is provided by the patient.  Urinary Frequency This is a new problem. The current episode started 2 days ago. The problem occurs constantly. The problem has not changed since onset.Associated symptoms include chest pain. Pertinent negatives include no headaches and no shortness of breath. Nothing aggravates the symptoms. Nothing relieves the symptoms. She has tried nothing for the symptoms. The treatment provided no relief.  Chest Pain Associated symptoms: no dizziness, no fever, no headache, no nausea, no palpitations, no shortness of breath and no vomiting        Past Medical History:  Diagnosis Date  . Bronchitis   . Goals of care, counseling/discussion 04/24/2019  . Hypertension   . Osteoarthritis of metacarpophalangeal (MCP) joint of left thumb   . Pelvic ring fracture ([place]) 09/2016   left    Patient Active Problem List   Diagnosis Date Noted  . Goals of care, counseling/discussion 04/24/2019  . Palliative care by specialist   . DNR (do not resuscitate) discussion   . Cancer associated pain   . Leucocytosis   . Hypoalbuminemia due to protein-calorie malnutrition ([place])   . Lung cancer metastatic to brain ([place]) 04/13/2019  . Postoperative pain   .  Spasticity   . Acute deep vein thrombosis (DVT) of calf muscle vein of right lower extremity ([place])   . Adenocarcinoma of lung, stage 4, right ([place]) 04/11/2019  . Metastatic cancer to brain ([place]) 04/05/2019  . Brain mass 04/01/2019  . Intermittent explosive disorder 06/13/2016  . Chest pain 01/11/2013  . HYPERCHOLESTEROLEMIA WITH HIGH HDL 04/08/2009  . Essential hypertension 04/08/2009  . TOBACCO ABUSE 11/20/2008    Past Surgical History:  Procedure Laterality Date  . APPLICATION OF CRANIAL NAVIGATION Left 04/05/2019   Procedure: APPLICATION OF CRANIAL NAVIGATION;  Surgeon: Earnie Larsson, MD;  Location: [facility];  Service: Neurosurgery;  Laterality: Left;  . CRANIOTOMY Left 04/05/2019   Procedure: LEFT FRONTAL CRANIOTOMY TUMOR EXCISION w/Brain Lab;  Surgeon: Earnie Larsson, MD;  Location: [facility];  Service: Neurosurgery;  Laterality: Left;  LEFT FRONTAL CRANIOTOMY TUMOR EXCISION w/Brain Lab  . IR IMAGING GUIDED PORT INSERTION  05/08/2019     OB History   No obstetric history on file.     Family History  Problem Relation Age of Onset  . CAD Father        CABG  . Stroke Father   . Cancer Brother        lymphoma    Social History   Tobacco Use  . Smoking status: Current Every Day Smoker    Packs/day: 0.50    Years: 25.00    Pack years: 12.50    Types: Cigarettes  . Smokeless tobacco: Never Used  Substance Use Topics  . Alcohol use: No    Alcohol/week: 0.0 standard drinks  . Drug use: No    Home Medications Prior to Admission medications   Medication Sig Start Date End Date Taking? Authorizing Provider  acetaminophen (TYLENOL) 325 MG tablet Take 2 tablets (650 mg total) by mouth every 6 (six) hours. 04/22/19   Love, Ivan Anchors, PA-C  amLODipine (NORVASC) 10 MG tablet TAKE 1 TABLET (10 MG TOTAL) BY MOUTH DAILY. NEED OFFICE VISIT 05/07/19   Pixie Casino, MD  apixaban (ELIQUIS) 5 MG TABS tablet Take 1 tablet (5 mg total) by mouth 2 (two) times daily. After starter pack is used up  05/09/19   Volanda Napoleon, MD  dronabinol (MARINOL) 5 MG capsule Take 1 capsule (5 mg total) by mouth 2 (two) times daily before lunch and supper. 04/23/19   Love, Ivan Anchors, PA-C  feeding supplement, ENSURE ENLIVE, (ENSURE ENLIVE) LIQD Take 237 mLs by mouth 2 (two) times daily between meals. 04/11/19   Viona [name] D, NP  fentaNYL (DURAGESIC) 75 MCG/HR Place 1 patch onto the skin every 3 (three) days. 05/07/19   Raulkar, Clide Deutscher, MD  folic acid (FOLVITE) 1 MG tablet Take 1 tablet (1 mg total) by mouth daily. Start 5-7 days before Alimta chemotherapy. Continue until 21 days after Alimta completed. 05/08/19   Volanda Napoleon, MD  HYDROmorphone (DILAUDID) 4 MG tablet Take 1-2 tablets (4-8 mg total) by mouth every 4 (four) hours as needed for moderate pain or severe pain. 05/03/19   Volanda Napoleon, MD  ketorolac (TORADOL) 10 MG tablet Take 1 tablet (10 mg total) by mouth every 6 (six) hours as needed. 05/09/19   Volanda Napoleon, MD  lactulose (CHRONULAC) 10 GM/15ML solution Take 30 mLs (20 g total) by mouth 2 (two) times daily. 05/06/19   Volanda Napoleon, MD  levETIRAcetam (KEPPRA) 500 MG tablet Take 1 tablet (500 mg total) by mouth 2 (two) times daily. 04/22/19   Love, Ivan Anchors, PA-C  lidocaine-prilocaine (EMLA) cream Apply to affected area once 05/08/19   Ennever, Rudell Cobb, MD  LORazepam (ATIVAN) 1 MG tablet Take 1 tablet (1 mg total) by mouth every 6 (six) hours as needed for anxiety. Or nausea or vomiting. 05/09/19   Volanda Napoleon, MD  methocarbamol (ROBAXIN) 500 MG tablet Take 1 tablet (500 mg total) by mouth every 6 (six) hours as needed for muscle spasms. 05/09/19   Volanda Napoleon, MD  NARCAN 4 MG/0.1ML LIQD nasal spray kit 1 spray once. 04/23/19   [provider]  ondansetron (ZOFRAN) 8 MG tablet Take 1 tablet (8 mg total) by mouth 2 (two) times daily as needed (Nausea or vomiting). Start if needed on the third day after chemotherapy. 05/08/19   Volanda Napoleon, MD  pantoprazole  (PROTONIX) 40 MG tablet Take 1 tablet (40 mg total) by mouth daily with supper. 04/23/19   Love, Ivan Anchors, PA-C  promethazine (PHENERGAN) 12.5 MG tablet Take 1-2 tablets (12.5-25 mg total) by mouth every 4 (four) hours as needed for refractory nausea / vomiting. 05/06/19   Volanda Napoleon, MD  senna-docusate (SENOKOT-S) 8.6-50 MG tablet Take 1 tablet by mouth 2 (two) times daily. 05/09/19   Volanda Napoleon, MD    Allergies    Patient has no known allergies.  Review of Systems   Review of Systems  Constitutional: Negative for chills and fever.  HENT: Negative for congestion and rhinorrhea.   Eyes: Negative for  redness and visual disturbance.  Respiratory: Negative for shortness of breath and wheezing.   Cardiovascular: Positive for chest pain. Negative for palpitations.  Gastrointestinal: Negative for nausea and vomiting.  Genitourinary: Positive for frequency. Negative for dysuria and urgency.  Musculoskeletal: Negative for arthralgias and myalgias.  Skin: Negative for pallor and wound.  Neurological: Negative for dizziness and headaches.    Physical Exam Updated Vital Signs BP (!) 146/98 (BP Location: Left Arm)   Pulse 96   Temp 98 F (36.7 C) (Oral)   Resp 15   Ht '5\' 7"'  (1.702 m)   Wt 60.8 kg   SpO2 95%   BMI 20.99 kg/m   Physical Exam Vitals and nursing note reviewed.  Constitutional:      General: She is not in acute distress.    Appearance: She is well-developed. She is not diaphoretic.  HENT:     Head: Normocephalic and atraumatic.  Eyes:     Pupils: Pupils are equal, round, and reactive to light.  Cardiovascular:     Rate and Rhythm: Regular rhythm. Tachycardia present.     Heart sounds: No murmur. No friction rub. No gallop.   Pulmonary:     Effort: Pulmonary effort is normal.     Breath sounds: No wheezing or rales.  Abdominal:     General: There is no distension.     Palpations: Abdomen is soft.     Tenderness: There is no abdominal tenderness.    Musculoskeletal:        General: No tenderness.     Cervical back: Normal range of motion and neck supple.  Skin:    General: Skin is warm and dry.  Neurological:     Mental Status: She is alert and oriented to person, place, and time.  Psychiatric:        Behavior: Behavior normal.     ED Results / Procedures / Treatments   Labs (all labs ordered are listed, but only abnormal results are displayed) Labs Reviewed  COMPREHENSIVE METABOLIC PANEL - Abnormal; Notable for the following components:      Result Value   Potassium 3.3 (*)    CO2 15 (*)    BUN <5 (*)    Creatinine, Ser 0.42 (*)    Calcium 7.7 (*)    Total Protein 5.7 (*)    Albumin 2.5 (*)    Alkaline Phosphatase 143 (*)    Total Bilirubin 1.3 (*)    Anion gap 16 (*)    All other components within normal limits  URINALYSIS, ROUTINE W REFLEX MICROSCOPIC - Abnormal; Notable for the following components:   Hgb urine dipstick MODERATE (*)    Ketones, ur 80 (*)    Protein, ur 30 (*)    Bacteria, UA RARE (*)    All other components within normal limits  CBC - Abnormal; Notable for the following components:   WBC 14.6 (*)    RBC 2.77 (*)    Hemoglobin 8.6 (*)    HCT 26.9 (*)    RDW 16.4 (*)    All other components within normal limits  DIFFERENTIAL - Abnormal; Notable for the following components:   Neutro Abs 13.7 (*)    Lymphs Abs 0.6 (*)    Abs Immature Granulocytes 0.09 (*)    All other components within normal limits  TROPONIN I (HIGH SENSITIVITY) - Abnormal; Notable for the following components:   Troponin I (High Sensitivity) 48 (*)    All other components within normal limits  CULTURE, BLOOD (ROUTINE X 2)  CULTURE, BLOOD (ROUTINE X 2)  URINE CULTURE  LIPASE, BLOOD  CBC WITH DIFFERENTIAL/PLATELET  TROPONIN I (HIGH SENSITIVITY)    EKG None  Radiology CT Angio Chest PE W and/or Wo Contrast  Result Date: 05/13/2019 CLINICAL DATA:  Shortness of breath. Recent diagnosis of metastatic non-small cell  cancer. EXAM: CT ANGIOGRAPHY CHEST WITH CONTRAST TECHNIQUE: Multidetector CT imaging of the chest was performed using the standard protocol during bolus administration of intravenous contrast. Multiplanar CT image reconstructions and MIPs were obtained to evaluate the vascular anatomy. CONTRAST:  158m OMNIPAQUE IOHEXOL 350 MG/ML SOLN COMPARISON:  PET CT 7 days ago FINDINGS: Cardiovascular: Positive for acute pulmonary embolus with filling defect in the lingular and left lower lobar pulmonary arteries. Thromboembolic burden is small. Aortic atherosclerosis without dissection. Heart size normal. Minimal pericardial fluid. Right chest port in place, tip in the atrial caval junction. Mediastinum/Nodes: Multifocal bulky mediastinal and right hilar adenopathy, most prominent involving the right lower paratracheal region. No a conglomerate measures up to 3.6 cm, not significantly changed from prior PET allowing for differences in technique. Right supraclavicular adenopathy with node measuring 2.1 cm. There also enlarged left supraclavicular nodes. No esophageal wall thickening. Lungs/Pleura: Small bilateral pleural effusions have improved from prior PET. Right pleural effusion extends into the fissure. Multiple pulmonary nodules throughout both lungs. Dominant lesion in the right upper lobe extension the hilum towards the apex with surrounding spiculations. Some of these nodules are more conspicuous given decrease in pleural fluid from prior. Otherwise no significant change in nodule since prior PET. Trachea and mainstem bronchi are patent. Upper Abdomen: Assessed on concurrent abdominal CT, reported separately. Musculoskeletal: Osseous metastatic disease, assessed on recent PET. This includes destructive lesion involving left anterior sixth rib. Many of the region of hypermetabolism on PET have faint or no CT correlate. Review of the MIP images confirms the above findings. IMPRESSION: 1. Positive for acute pulmonary  embolus in the lingular and left lower lobar pulmonary arteries. Thromboembolic burden is small. 2. Small bilateral pleural effusions, improved since PET CT 1 week ago. 3. Recent diagnosis of non-small cell lung cancer with recent staging PET scan 1 week ago. Multiple pulmonary nodules, multifocal thoracic adenopathy, and osseous metastatic disease. Overall no significant change. 4.  Aortic Atherosclerosis (ICD10-I70.0). Critical Value/emergent results were called by telephone at the time of interpretation on 05/13/2019 at 6:51 am to [facility], who verbally acknowledged these results. Electronically Signed   By: MKeith RakeM.D.   On: 05/13/2019 06:51   CT ABDOMEN PELVIS W CONTRAST  Result Date: 05/13/2019 CLINICAL DATA:  Abdominal pain. Urinary frequency. Right-sided pain. EXAM: CT ABDOMEN AND PELVIS WITH CONTRAST TECHNIQUE: Multidetector CT imaging of the abdomen and pelvis was performed using the standard protocol following bolus administration of intravenous contrast. CONTRAST:  1060mOMNIPAQUE IOHEXOL 350 MG/ML SOLN COMPARISON:  Recent PET CT 05/06/2019, abdominal CT 04/02/2019 FINDINGS: Lower chest: Assessed on concurrent chest CT. Small pleural effusions. Hepatobiliary: Small low-density lesions in the liver consistent with cyst. There is no additional vague hypodensity in the right lobe measuring 16 mm, previously with lobulated enhancement, likely hemangioma. No new liver lesion. Gallbladder physiologically distended, no calcified stone. No biliary dilatation. Pancreas: No ductal dilatation or inflammation. Spleen: Calcified granuloma. Normal in size. Adrenals/Urinary Tract: Mild left adrenal thickening again seen. Normal right adrenal gland. Heterogeneous enhancement of the right kidney with patchy nephrogram suspicious for pyelonephritis. No hydronephrosis. No renal fluid collection. Small low-density lesion in the  upper left kidney is similar to prior exam. Mild urinary bladder wall  thickening. No urolithiasis. Stomach/Bowel: Stomach partially distended. No gastric wall thickening. No bowel obstruction or inflammation. Appendix not confidently visualized, no evidence of appendicitis. Vascular/Lymphatic: Aortic atherosclerosis. No aneurysm. 10 mm left periaortic node unchanged from recent PET, series 4, image 37. No new adenopathy. Reproductive: Small uterine fibroid. No adnexal mass. Other: No ascites. No free air. Musculoskeletal: Destructive lytic lesion within L3 vertebral body, unchanged from recent PET. No new osseous abnormality. IMPRESSION: 1. Findings suspicious for cystitis and right pyelonephritis. 2. Destructive lytic lesion within L3 vertebral body, unchanged from recent PET. Electronically Signed   By: Keith Rake M.D.   On: 05/13/2019 07:00   DG Chest Port 1 View  Result Date: 05/13/2019 CLINICAL DATA:  Chest pain. Recent diagnosis of lung cancer. EXAM: PORTABLE CHEST 1 VIEW COMPARISON:  Radiograph 04/01/2019. PET CT 05/06/2019 reviewed FINDINGS: Patient is rotated. Right chest port with tip in the lower SVC. No pneumothorax. Right pleural effusion. Left pleural effusion on PET CT not well seen. Right paratracheal soft tissue density, likely related to underlying pulmonary mass. Patchy opacity in the left perihilar lung. No pneumothorax. Bone lesions on prior PET are not well demonstrated radiographically. IMPRESSION: 1. Right pleural effusion. Left pleural effusion on recent PET not visualized radiographically. Right paratracheal soft tissue density likely related to known pulmonary mass. 2. Patchy opacities in the left perihilar lung likely correspond pulmonary nodules on PET. 3. Right chest port in place, tip in the SVC. Electronically Signed   By: Keith Rake M.D.   On: 05/13/2019 05:01    Procedures Procedures (including critical care time)  Medications Ordered in ED Medications  sodium chloride (PF) 0.9 % injection (has no administration in time range)    sodium chloride 0.9 % bolus 1,000 mL (0 mLs Intravenous Stopped 05/13/19 0608)  HYDROmorphone (DILAUDID) injection 0.5 mg (0.5 mg Intravenous Given 05/13/19 0421)  ondansetron (ZOFRAN) injection 4 mg (4 mg Intravenous Given 05/13/19 0421)  HYDROmorphone (DILAUDID) injection 0.5 mg (0.5 mg Intravenous Given 05/13/19 0623)  iohexol (OMNIPAQUE) 350 MG/ML injection 100 mL (100 mLs Intravenous Contrast Given 05/13/19 0604)    ED Course  I have reviewed the triage vital signs and the nursing notes.  Pertinent labs & imaging results that were available during my care of the patient were reviewed by me and considered in my medical decision making (see chart for details).    MDM Rules/Calculators/A&P                      64 yo F with a chief complaints of increased urinary frequency chest pain and fatigue.  Chest pain is been going on for some time but the urinary symptoms started about 12 hours ago.  Will screen for infection.  Check lab work.  She is tachycardic and appears uncomfortable we will treat with pain medicine.  Bolus of IV fluids.  Reassess.  Patient has a mildly elevated troponin.  With her history of lung cancer and chest discomfort must consider pulmonary embolism as possible pathology.  I am unsure the cause of her increased urinary frequency, UA is not consistent with urinary tract infection.  With her diffuse discomfort will obtain a CT of the abdomen pelvis.  Patient was found to have a pulmonary embolism on CT scan.  She is currently on Eliquis.  Had a DVT postoperative about 3 weeks ago per the family.  With a positive troponin and a  PE with a history of cancer makes the patient high risk for decompensation.  Will discuss with medicine for possible admission.  CRITICAL CARE Performed by: Cecilio Asper   Total critical care time: 35 minutes  Critical care time was exclusive of separately billable procedures and treating other patients.  Critical care was necessary to  treat or prevent imminent or life-threatening deterioration.  Critical care was time spent personally by me on the following activities: development of treatment plan with patient and/or surrogate as well as nursing, discussions with consultants, evaluation of patient's response to treatment, examination of patient, obtaining history from patient or surrogate, ordering and performing treatments and interventions, ordering and review of laboratory studies, ordering and review of radiographic studies, pulse oximetry and re-evaluation of patient's condition.  The patients results and plan were reviewed and discussed.   Any x-rays performed were independently reviewed by myself.   Differential diagnosis were considered with the presenting HPI.  Medications  sodium chloride (PF) 0.9 % injection (has no administration in time range)  sodium chloride 0.9 % bolus 1,000 mL (0 mLs Intravenous Stopped 05/13/19 0608)  HYDROmorphone (DILAUDID) injection 0.5 mg (0.5 mg Intravenous Given 05/13/19 0421)  ondansetron (ZOFRAN) injection 4 mg (4 mg Intravenous Given 05/13/19 0421)  HYDROmorphone (DILAUDID) injection 0.5 mg (0.5 mg Intravenous Given 05/13/19 0623)  iohexol (OMNIPAQUE) 350 MG/ML injection 100 mL (100 mLs Intravenous Contrast Given 05/13/19 0604)    Vitals:   05/13/19 0324 05/13/19 0500 05/13/19 0545 05/13/19 0625  BP: 107/73 132/60  (!) 146/98  Pulse: (!) 110  90 96  Resp: '20  17 15  ' Temp: 98 F (36.7 C)     TempSrc: Oral     SpO2: 95%  91% 95%  Weight: 60.8 kg     Height: '5\' 7"'  (1.702 m)       Final diagnoses:  Other acute pulmonary embolism without acute cor pulmonale (HCC)  Urinary frequency    Admission/ observation were discussed with the admitting physician, patient and/or family and they are comfortable with the plan.    Final Clinical Impression(s) / ED Diagnoses Final diagnoses:  Other acute pulmonary embolism without acute cor pulmonale ([place])  Urinary frequency    Rx /  DC Orders ED Discharge Orders    None       Deno Etienne, DO 05/13/19 [phone]

## 2019-05-13 NOTE — H&P (Addendum)
History and Physical    [name] HKV:[id number] DOB: [birth year] DOA: 05/13/2019  Referring MD/NP/PA: Dr. Deno Etienne, MD PCP: [name], MD  Patient coming from: Home  Chief Complaint: Urinary frequency and pain  I have personally briefly reviewed patient's old medical records in [place]   HPI: [name] is a 64 y.o. female with medical history significant of hypertension, DVT on Eliquis, and metastatic adenocarcinoma of the lung on chemotherapy followed by Dr. Marin Olp.  History is obtained from the patient and her husband.  She presents with complaints of increasing urinary frequency that started yesterday afternoon.  Noted to have to urinate every 15 minutes.  She reports associated symptoms of urgency, discomfort with urinating, nonlocalized flank pain, and constipation.  She complains of her pain being uncontrolled despite utilizing hydromorphone 8 mg every 4 hours along with her fentanyl patch.  She has known solitary brain, left scapula/rib metastases which she underwent radiation treatments but did not tolerate it well.  Furthermore, she complained of chest pain that she noted was pleuritic in nature.  Patient had been off of Eliquis for 4 days, to undergo port placement last week.  Her last chemotherapy session was 3 days ago since that time patient has been sick with complaints of nausea.  Denies having any vomiting or fevers.  Husband notes that the patient has not had a bowel movement in several days despite taking medications.  ED Course: On admission into the emergency department patient was seen to be afebrile with heart rates up to 110, and all other vital signs maintained.  Labs significant for WBC 14.6, hemoglobin 8.6, potassium 3.3, CO2 15, calcium 7.7, alkaline phosphatase 143, albumin 2.5, total bilirubin 1.3, and troponin 48.  Urinalysis noted moderate hemoglobin, and 0-5 RBCs, negative nitrites, negative leukocytes, rare bacteria, and 6-10 WBCs.  CT scan of the  abdomen and pelvis did reveal signs of a right-sided pyelonephritis and cystitis.  CT angiogram of the chest noted small left-sided pulmonary embolus with small burden and bilateral pleural effusions.  Patient was given 1 L normal saline IV fluids, hydromorphone, and Rocephin for suspected pyelonephritis.  Review of Systems  Constitutional: Positive for malaise/fatigue. Negative for fever.  HENT: Negative for nosebleeds and sinus pain.   Eyes: Negative for pain.  Respiratory: Negative for cough and hemoptysis.   Cardiovascular: Positive for chest pain. Negative for leg swelling.  Gastrointestinal: Positive for constipation and nausea. Negative for blood in stool and vomiting.  Genitourinary: Positive for dysuria, flank pain, frequency and urgency.  Musculoskeletal: Positive for joint pain and myalgias.  Neurological: Positive for weakness. Negative for focal weakness and loss of consciousness.  Psychiatric/Behavioral: Negative for memory loss and substance abuse.    Past Medical History:  Diagnosis Date   Bronchitis    Goals of care, counseling/discussion 04/24/2019   Hypertension    Osteoarthritis of metacarpophalangeal (MCP) joint of left thumb    Pelvic ring fracture ([place]) 09/2016   left    Past Surgical History:  Procedure Laterality Date   APPLICATION OF CRANIAL NAVIGATION Left 04/05/2019   Procedure: APPLICATION OF CRANIAL NAVIGATION;  Surgeon: Earnie Larsson, MD;  Location: [place];  Service: Neurosurgery;  Laterality: Left;   CRANIOTOMY Left 04/05/2019   Procedure: LEFT FRONTAL CRANIOTOMY TUMOR EXCISION w/Brain Lab;  Surgeon: Earnie Larsson, MD;  Location: [place];  Service: Neurosurgery;  Laterality: Left;  LEFT FRONTAL CRANIOTOMY TUMOR EXCISION w/Brain Lab   IR IMAGING GUIDED PORT INSERTION  05/08/2019     reports  that she has been smoking cigarettes. She has a 12.50 pack-year smoking history. She has never used smokeless tobacco. She reports that she does not drink alcohol  or use drugs.  No Known Allergies  Family History  Problem Relation Age of Onset   CAD Father        CABG   Stroke Father    Cancer Brother        lymphoma    Prior to Admission medications   Medication Sig Start Date End Date Taking? Authorizing Provider  acetaminophen (TYLENOL) 325 MG tablet Take 2 tablets (650 mg total) by mouth every 6 (six) hours. 04/22/19   Love, Ivan Anchors, PA-C  amLODipine (NORVASC) 10 MG tablet TAKE 1 TABLET (10 MG TOTAL) BY MOUTH DAILY. NEED OFFICE VISIT 05/07/19   Pixie Casino, MD  apixaban (ELIQUIS) 5 MG TABS tablet Take 1 tablet (5 mg total) by mouth 2 (two) times daily. After starter pack is used up 05/09/19   Volanda Napoleon, MD  dronabinol (MARINOL) 5 MG capsule Take 1 capsule (5 mg total) by mouth 2 (two) times daily before lunch and supper. 04/23/19   Love, Ivan Anchors, PA-C  feeding supplement, ENSURE ENLIVE, (ENSURE ENLIVE) LIQD Take 237 mLs by mouth 2 (two) times daily between meals. 04/11/19   Viona [name] D, NP  fentaNYL (DURAGESIC) 75 MCG/HR Place 1 patch onto the skin every 3 (three) days. 05/07/19   Raulkar, Clide Deutscher, MD  folic acid (FOLVITE) 1 MG tablet Take 1 tablet (1 mg total) by mouth daily. Start 5-7 days before Alimta chemotherapy. Continue until 21 days after Alimta completed. 05/08/19   Volanda Napoleon, MD  HYDROmorphone (DILAUDID) 4 MG tablet Take 1-2 tablets (4-8 mg total) by mouth every 4 (four) hours as needed for moderate pain or severe pain. 05/03/19   Volanda Napoleon, MD  ketorolac (TORADOL) 10 MG tablet Take 1 tablet (10 mg total) by mouth every 6 (six) hours as needed. 05/09/19   Volanda Napoleon, MD  lactulose (CHRONULAC) 10 GM/15ML solution Take 30 mLs (20 g total) by mouth 2 (two) times daily. 05/06/19   Volanda Napoleon, MD  levETIRAcetam (KEPPRA) 500 MG tablet Take 1 tablet (500 mg total) by mouth 2 (two) times daily. 04/22/19   Love, Ivan Anchors, PA-C  lidocaine-prilocaine (EMLA) cream Apply to affected area once 05/08/19    Ennever, Rudell Cobb, MD  LORazepam (ATIVAN) 1 MG tablet Take 1 tablet (1 mg total) by mouth every 6 (six) hours as needed for anxiety. Or nausea or vomiting. 05/09/19   Volanda Napoleon, MD  methocarbamol (ROBAXIN) 500 MG tablet Take 1 tablet (500 mg total) by mouth every 6 (six) hours as needed for muscle spasms. 05/09/19   Volanda Napoleon, MD  NARCAN 4 MG/0.1ML LIQD nasal spray kit 1 spray once. 04/23/19   [provider]  ondansetron (ZOFRAN) 8 MG tablet Take 1 tablet (8 mg total) by mouth 2 (two) times daily as needed (Nausea or vomiting). Start if needed on the third day after chemotherapy. 05/08/19   Volanda Napoleon, MD  pantoprazole (PROTONIX) 40 MG tablet Take 1 tablet (40 mg total) by mouth daily with supper. 04/23/19   Love, Ivan Anchors, PA-C  promethazine (PHENERGAN) 12.5 MG tablet Take 1-2 tablets (12.5-25 mg total) by mouth every 4 (four) hours as needed for refractory nausea / vomiting. 05/06/19   Volanda Napoleon, MD  senna-docusate (SENOKOT-S) 8.6-50 MG tablet Take 1 tablet by mouth 2 (  two) times daily. 05/09/19   Volanda Napoleon, MD    Physical Exam:  Constitutional: Older female who appears to be in distress Vitals:   05/13/19 0324 05/13/19 0500 05/13/19 0545 05/13/19 0625  BP: 107/73 132/60  (!) 146/98  Pulse: (!) 110  90 96  Resp: '20  17 15  ' Temp: 98 F (36.7 C)     TempSrc: Oral     SpO2: 95%  91% 95%  Weight: 60.8 kg     Height: '5\' 7"'  (1.702 m)      Eyes: PERRL, lids and conjunctivae normal ENMT: Mucous membranes are dry. Posterior pharynx clear of any exudate or lesions.  Neck: normal, supple, no masses, no thyromegaly Respiratory: clear to auscultation bilaterally, no wheezing, no crackles. Normal respiratory effort. No accessory muscle use.  Cardiovascular: Only tachycardic, no murmurs / rubs / gallops. No extremity edema. 2+ pedal pulses. No carotid bruits.  Abdomen: no tenderness, no masses palpated. No hepatosplenomegaly. Bowel sounds positive.    Musculoskeletal: no clubbing / cyanosis. No joint deformity upper and lower extremities. Good ROM, no contractures. Normal muscle tone.  Skin: no rashes, lesions, ulcers. No induration Neurologic: CN 2-12 grossly intact. Sensation intact, DTR normal. Strength 5/5 in all 4.  Psychiatric: Normal judgment and insight. Alert and oriented x 3. Normal mood.     Labs on Admission: I have personally reviewed following labs and imaging studies  CBC: Recent Labs  Lab 05/08/19 1226 05/09/19 1037 05/13/19 0505  WBC 23.0* 21.9* 14.6*  NEUTROABS  --  19.2* 13.7*  HGB 11.4* 10.3* 8.6*  HCT 36.3 32.0* 26.9*  MCV 97.6 95.0 97.1  PLT 606* 552* 403   Basic Metabolic Panel: Recent Labs  Lab 05/09/19 1037 05/13/19 0407  NA 141 138  K 4.2 3.3*  CL 105 107  CO2 17* 15*  GLUCOSE 98 93  BUN 8 <5*  CREATININE 0.55 0.42*  CALCIUM 8.9 7.7*   GFR: Estimated Creatinine Clearance: 69.1 mL/min (A) (by C-G formula based on SCr of 0.42 mg/dL (L)). Liver Function Tests: Recent Labs  Lab 05/09/19 1037 05/13/19 0407  AST 10* 25  ALT 5 17  ALKPHOS 195* 143*  BILITOT 0.3 1.3*  PROT 6.0* 5.7*  ALBUMIN 3.2* 2.5*   Recent Labs  Lab 05/13/19 0407  LIPASE 18   No results for input(s): AMMONIA in the last 168 hours. Coagulation Profile: Recent Labs  Lab 05/08/19 1226  INR 1.1   Cardiac Enzymes: No results for input(s): CKTOTAL, CKMB, CKMBINDEX, TROPONINI in the last 168 hours. BNP (last 3 results) No results for input(s): PROBNP in the last 8760 hours. HbA1C: No results for input(s): HGBA1C in the last 72 hours. CBG: Recent Labs  Lab 05/06/19 0848  GLUCAP 94   Lipid Profile: No results for input(s): CHOL, HDL, LDLCALC, TRIG, CHOLHDL, LDLDIRECT in the last 72 hours. Thyroid Function Tests: No results for input(s): TSH, T4TOTAL, FREET4, T3FREE, THYROIDAB in the last 72 hours. Anemia Panel: No results for input(s): VITAMINB12, FOLATE, FERRITIN, TIBC, IRON, RETICCTPCT in the last 72  hours. Urine analysis:    Component Value Date/Time   COLORURINE YELLOW 05/13/2019 0407   APPEARANCEUR CLEAR 05/13/2019 0407   LABSPEC 1.017 05/13/2019 0407   PHURINE 5.0 05/13/2019 0407   GLUCOSEU NEGATIVE 05/13/2019 0407   HGBUR MODERATE (A) 05/13/2019 0407   BILIRUBINUR NEGATIVE 05/13/2019 0407   KETONESUR 80 (A) 05/13/2019 0407   PROTEINUR 30 (A) 05/13/2019 0407   NITRITE NEGATIVE 05/13/2019 0407   LEUKOCYTESUR NEGATIVE  05/13/2019 0407   Sepsis Labs: No results found for this or any previous visit (from the past 240 hour(s)).   Radiological Exams on Admission: CT Angio Chest PE W and/or Wo Contrast  Result Date: 05/13/2019 CLINICAL DATA:  Shortness of breath. Recent diagnosis of metastatic non-small cell cancer. EXAM: CT ANGIOGRAPHY CHEST WITH CONTRAST TECHNIQUE: Multidetector CT imaging of the chest was performed using the standard protocol during bolus administration of intravenous contrast. Multiplanar CT image reconstructions and MIPs were obtained to evaluate the vascular anatomy. CONTRAST:  152m OMNIPAQUE IOHEXOL 350 MG/ML SOLN COMPARISON:  PET CT 7 days ago FINDINGS: Cardiovascular: Positive for acute pulmonary embolus with filling defect in the lingular and left lower lobar pulmonary arteries. Thromboembolic burden is small. Aortic atherosclerosis without dissection. Heart size normal. Minimal pericardial fluid. Right chest port in place, tip in the atrial caval junction. Mediastinum/Nodes: Multifocal bulky mediastinal and right hilar adenopathy, most prominent involving the right lower paratracheal region. No a conglomerate measures up to 3.6 cm, not significantly changed from prior PET allowing for differences in technique. Right supraclavicular adenopathy with node measuring 2.1 cm. There also enlarged left supraclavicular nodes. No esophageal wall thickening. Lungs/Pleura: Small bilateral pleural effusions have improved from prior PET. Right pleural effusion extends into the  fissure. Multiple pulmonary nodules throughout both lungs. Dominant lesion in the right upper lobe extension the hilum towards the apex with surrounding spiculations. Some of these nodules are more conspicuous given decrease in pleural fluid from prior. Otherwise no significant change in nodule since prior PET. Trachea and mainstem bronchi are patent. Upper Abdomen: Assessed on concurrent abdominal CT, reported separately. Musculoskeletal: Osseous metastatic disease, assessed on recent PET. This includes destructive lesion involving left anterior sixth rib. Many of the region of hypermetabolism on PET have faint or no CT correlate. Review of the MIP images confirms the above findings. IMPRESSION: 1. Positive for acute pulmonary embolus in the lingular and left lower lobar pulmonary arteries. Thromboembolic burden is small. 2. Small bilateral pleural effusions, improved since PET CT 1 week ago. 3. Recent diagnosis of non-small cell lung cancer with recent staging PET scan 1 week ago. Multiple pulmonary nodules, multifocal thoracic adenopathy, and osseous metastatic disease. Overall no significant change. 4.  Aortic Atherosclerosis (ICD10-I70.0). Critical Value/emergent results were called by telephone at the time of interpretation on 05/13/2019 at 6:51 am to Dr [facility], who verbally acknowledged these results. Electronically Signed   By: MKeith RakeM.D.   On: 05/13/2019 06:51   CT ABDOMEN PELVIS W CONTRAST  Result Date: 05/13/2019 CLINICAL DATA:  Abdominal pain. Urinary frequency. Right-sided pain. EXAM: CT ABDOMEN AND PELVIS WITH CONTRAST TECHNIQUE: Multidetector CT imaging of the abdomen and pelvis was performed using the standard protocol following bolus administration of intravenous contrast. CONTRAST:  1067mOMNIPAQUE IOHEXOL 350 MG/ML SOLN COMPARISON:  Recent PET CT 05/06/2019, abdominal CT 04/02/2019 FINDINGS: Lower chest: Assessed on concurrent chest CT. Small pleural effusions. Hepatobiliary:  Small low-density lesions in the liver consistent with cyst. There is no additional vague hypodensity in the right lobe measuring 16 mm, previously with lobulated enhancement, likely hemangioma. No new liver lesion. Gallbladder physiologically distended, no calcified stone. No biliary dilatation. Pancreas: No ductal dilatation or inflammation. Spleen: Calcified granuloma. Normal in size. Adrenals/Urinary Tract: Mild left adrenal thickening again seen. Normal right adrenal gland. Heterogeneous enhancement of the right kidney with patchy nephrogram suspicious for pyelonephritis. No hydronephrosis. No renal fluid collection. Small low-density lesion in the upper left kidney is similar to prior  exam. Mild urinary bladder wall thickening. No urolithiasis. Stomach/Bowel: Stomach partially distended. No gastric wall thickening. No bowel obstruction or inflammation. Appendix not confidently visualized, no evidence of appendicitis. Vascular/Lymphatic: Aortic atherosclerosis. No aneurysm. 10 mm left periaortic node unchanged from recent PET, series 4, image 37. No new adenopathy. Reproductive: Small uterine fibroid. No adnexal mass. Other: No ascites. No free air. Musculoskeletal: Destructive lytic lesion within L3 vertebral body, unchanged from recent PET. No new osseous abnormality. IMPRESSION: 1. Findings suspicious for cystitis and right pyelonephritis. 2. Destructive lytic lesion within L3 vertebral body, unchanged from recent PET. Electronically Signed   By: Keith Rake M.D.   On: 05/13/2019 07:00   DG Chest Port 1 View  Result Date: 05/13/2019 CLINICAL DATA:  Chest pain. Recent diagnosis of lung cancer. EXAM: PORTABLE CHEST 1 VIEW COMPARISON:  Radiograph 04/01/2019. PET CT 05/06/2019 reviewed FINDINGS: Patient is rotated. Right chest port with tip in the lower SVC. No pneumothorax. Right pleural effusion. Left pleural effusion on PET CT not well seen. Right paratracheal soft tissue density, likely related to  underlying pulmonary mass. Patchy opacity in the left perihilar lung. No pneumothorax. Bone lesions on prior PET are not well demonstrated radiographically. IMPRESSION: 1. Right pleural effusion. Left pleural effusion on recent PET not visualized radiographically. Right paratracheal soft tissue density likely related to known pulmonary mass. 2. Patchy opacities in the left perihilar lung likely correspond pulmonary nodules on PET. 3. Right chest port in place, tip in the SVC. Electronically Signed   By: Keith Rake M.D.   On: 05/13/2019 05:01    EKG: Independently reviewed.  Sinus rhythm at 95 bpm  Assessment/Plan  Pulmonary embolus, history of DVT: Acute.  Patient found to have acute left-sided PEs with small burden on CT angiogram of the chest.she has a known history of DVT for which she was on Eliquis in the first place.  She had been taken off of Eliquis 1/10 for port placement, but had restarted taking the medication as advised on 1/14.  Patient had initially been started on full dose Lovenox.  After messaging Dr. Marin Olp it was recommended to place patient on a heparin drip and then ultimately transition to Lovenox instead of keeping her on Eliquis. -Admit to a medical telemetry bed -Heparin per pharmacy -Check Doppler ultrasound of the lower extremities -Check echocardiogram   Suspected SIRS/sepsis secondary to pyelonephritis: Acute.  Patient presented initially tachycardic with WBC 14.6.  Lactic acid was not initially obtained patient met SIRS/sepsis criteria, but appears tachycardia with leukocytosis could also be contributed to pulmonary embolus.  Urinalysis significant for moderate hemoglobin, rare bacteria, negative leukocytes, negative nitrites, and 6-10 WBCs.  However CT scan of the abdomen and pelvis did show concern for right-sided pyelonephritis and cystitis. -Follow-up blood and urine cultures -Continue empiric antibiotics of Rocephin  Uncontrolled pain, adenocarcinoma of the  lung with mets to the brain and rib: Acute.  Patient with metastatic adenocarcinoma with metastases to the left scapula/rib and solitary brain lesion.  Patient has received palliative radiation therapy without improvement in pain symptoms.  Reportedly has been taking Dilaudid 8 mg every 4 hours without relief of pain in addition to her fentanyl patch. -Continue fentanyl patch -Dilaudid PCA pump -Continue lactulose and Keppra -Palliative care consulted for symptom management  Chemotherapy induced anemia: Acute.  Patient's hemoglobin noted be 8.6 on admission, but appears to have been trending down since she received chemotherapy.  Hemoglobin had been 13.1 on 04/14/2019.  She denies any reports of bleeding and  therefore suspected chemotherapy-induced anemia. -Type and screen for possible need of blood products -Check stool guaiac -Continue to monitor H&H   Hypokalemia: Acute.  Potassium noted to be 3.3 -Give 40 mEq of potassium chloride x1 dose now -Continue to monitor and replace as needed  Elevated troponin: Acute.  Initial troponin 48 and repeat 51.  Suspect that this is secondary to demand after being found of acute pulmonary embolus. -Continue to monitor  Metabolic acidosis with elevated anion gap: On admission CO2 noted to be 15 with anion gap mildly elevated at 16 and glucose 93.  Unclear cause of the electrolyte derangement at this time. -May warrant further investigation  Essential hypertension: Patient had no longer been taking amlodipine. -Continue to monitor  Anxiety -Continue Ativan as needed  GERD -Continue Protonix  DVT prophylaxis: Lovenox Code Status: Full Family Communication: Discussed plan of care with the patient's husband present at bedside. Disposition Plan: Possible discharge home in 2 to 3 days. Consults called: Cardiology Admission status: inpatient   Norval Morton MD [facility] Pager [phone]   If 7PM-7AM, please contact  night-coverage [email or web address] Password [facility]  05/13/2019, 7:21 AM

## 2019-05-13 NOTE — ED Notes (Signed)
Patient reports pain 10/10 to right leg.

## 2019-05-13 NOTE — ED Triage Notes (Signed)
Patient is complaining of urinary frequency. Patient is having to urinate every 5 to 10 min. Patient can not get comfortable. Patient is complaining of pain on the right side where the port was put on Wednesday. Patient is on Chemo. Last Chemo session was Thursday.

## 2019-05-13 NOTE — Progress Notes (Signed)
Bilateral lower extremity venous duplex order received in the setting of pulmonary embolism. Patient has had two recent lower extremity venous duplexes (04/08/2019 and 04/21/2019), the latest one showing acute changes to right lower extremity DVT. Please advise if repeat duplex is needed. Thank you.  05/13/2019 2:00 PM Maudry Mayhew, MHA, RVT, RDCS, RDMS

## 2019-05-14 ENCOUNTER — Ambulatory Visit: Payer: BC Managed Care – PPO

## 2019-05-14 ENCOUNTER — Telehealth: Payer: Self-pay | Admitting: Radiation Therapy

## 2019-05-14 ENCOUNTER — Inpatient Hospital Stay (HOSPITAL_COMMUNITY): Payer: BC Managed Care – PPO

## 2019-05-14 ENCOUNTER — Ambulatory Visit
Admit: 2019-05-14 | Discharge: 2019-05-14 | Disposition: A | Discharge status: Home / Self Care | Payer: BC Managed Care – PPO | Attending: Radiation Oncology | Admitting: Radiation Oncology

## 2019-05-14 DIAGNOSIS — T451X5A Adverse effect of antineoplastic and immunosuppressive drugs, initial encounter: Secondary | ICD-10-CM

## 2019-05-14 DIAGNOSIS — C7931 Secondary malignant neoplasm of brain: Secondary | ICD-10-CM

## 2019-05-14 DIAGNOSIS — R197 Diarrhea, unspecified: Secondary | ICD-10-CM

## 2019-05-14 DIAGNOSIS — I82409 Acute embolism and thrombosis of unspecified deep veins of unspecified lower extremity: Secondary | ICD-10-CM

## 2019-05-14 DIAGNOSIS — D6481 Anemia due to antineoplastic chemotherapy: Secondary | ICD-10-CM

## 2019-05-14 DIAGNOSIS — Z9221 Personal history of antineoplastic chemotherapy: Secondary | ICD-10-CM

## 2019-05-14 DIAGNOSIS — G893 Neoplasm related pain (acute) (chronic): Secondary | ICD-10-CM

## 2019-05-14 DIAGNOSIS — R5383 Other fatigue: Secondary | ICD-10-CM

## 2019-05-14 DIAGNOSIS — C7951 Secondary malignant neoplasm of bone: Secondary | ICD-10-CM

## 2019-05-14 DIAGNOSIS — R110 Nausea: Secondary | ICD-10-CM

## 2019-05-14 DIAGNOSIS — Z7901 Long term (current) use of anticoagulants: Secondary | ICD-10-CM

## 2019-05-14 DIAGNOSIS — R399 Unspecified symptoms and signs involving the genitourinary system: Secondary | ICD-10-CM

## 2019-05-14 DIAGNOSIS — I2699 Other pulmonary embolism without acute cor pulmonale: Secondary | ICD-10-CM

## 2019-05-14 DIAGNOSIS — Z515 Encounter for palliative care: Secondary | ICD-10-CM

## 2019-05-14 DIAGNOSIS — C3411 Malignant neoplasm of upper lobe, right bronchus or lung: Secondary | ICD-10-CM

## 2019-05-14 DIAGNOSIS — Z79899 Other long term (current) drug therapy: Secondary | ICD-10-CM

## 2019-05-14 DIAGNOSIS — Z923 Personal history of irradiation: Secondary | ICD-10-CM

## 2019-05-14 LAB — CBC
HCT: 28.5 % — ABNORMAL LOW (ref 36.0–46.0)
Hemoglobin: 9.3 g/dL — ABNORMAL LOW (ref 12.0–15.0)
MCH: 31.2 pg (ref 26.0–34.0)
MCHC: 32.6 g/dL (ref 30.0–36.0)
MCV: 95.6 fL (ref 80.0–100.0)
Platelets: 318 10*3/uL (ref 150–400)
RBC: 2.98 MIL/uL — ABNORMAL LOW (ref 3.87–5.11)
RDW: 16.3 % — ABNORMAL HIGH (ref 11.5–15.5)
WBC: 15.1 10*3/uL — ABNORMAL HIGH (ref 4.0–10.5)
nRBC: 0 % (ref 0.0–0.2)

## 2019-05-14 LAB — COMPREHENSIVE METABOLIC PANEL
ALT: 15 U/L (ref 0–44)
AST: 19 U/L (ref 15–41)
Albumin: 2.7 g/dL — ABNORMAL LOW (ref 3.5–5.0)
Alkaline Phosphatase: 157 U/L — ABNORMAL HIGH (ref 38–126)
Anion gap: 16 — ABNORMAL HIGH (ref 5–15)
BUN: <5 mg/dL — ABNORMAL LOW (ref 8–23)
CO2: 19 mmol/L — ABNORMAL LOW (ref 22–32)
Calcium: 8.4 mg/dL — ABNORMAL LOW (ref 8.9–10.3)
Chloride: 104 mmol/L (ref 98–111)
Creatinine, Ser: 0.44 mg/dL (ref 0.44–1.00)
GFR calc Af Amer: >60 mL/min (ref >60)
GFR calc non Af Amer: >60 mL/min (ref >60)
Glucose, Bld: 117 mg/dL — ABNORMAL HIGH (ref 70–99)
Potassium: 3.1 mmol/L — ABNORMAL LOW (ref 3.5–5.1)
Sodium: 139 mmol/L (ref 135–145)
Total Bilirubin: 0.9 mg/dL (ref 0.3–1.2)
Total Protein: 6.7 g/dL (ref 6.5–8.1)

## 2019-05-14 LAB — HEPARIN LEVEL (UNFRACTIONATED)
Heparin Unfractionated: 0.8 IU/mL — ABNORMAL HIGH (ref 0.30–0.70)
Heparin Unfractionated: 0.57 IU/mL (ref 0.30–0.70)
Heparin Unfractionated: 0.37 IU/mL (ref 0.30–0.70)

## 2019-05-14 LAB — PROTIME-INR
INR: 1.2 (ref 0.8–1.2)
Prothrombin Time: 15.4 seconds — ABNORMAL HIGH (ref 11.4–15.2)

## 2019-05-14 LAB — URINE CULTURE

## 2019-05-14 LAB — APTT
aPTT: 67 seconds — ABNORMAL HIGH (ref 24–36)
aPTT: 57 seconds — ABNORMAL HIGH (ref 24–36)

## 2019-05-14 LAB — PREALBUMIN: Prealbumin: 15 mg/dL — ABNORMAL LOW (ref 18–38)

## 2019-05-14 LAB — SARS CORONAVIRUS 2 (TAT 6-24 HRS): SARS Coronavirus 2: NEGATIVE

## 2019-05-14 MED ORDER — HYDROMORPHONE HCL 1 MG/ML IJ SOLN: 1.0000 mg | INTRAMUSCULAR | Status: DC | PRN

## 2019-05-14 MED ORDER — HYDROMORPHONE HCL 1 MG/ML IJ SOLN
1.0000 mg | INTRAMUSCULAR | Status: AC | PRN
  Administered 2019-05-14 – 2019-05-15 (×3): 1 mg via INTRAVENOUS
  Filled 2019-05-14 (×3): qty 1

## 2019-05-14 MED ORDER — FENTANYL 75 MCG/HR TD PT72: 1.0000 | MEDICATED_PATCH | TRANSDERMAL | Status: DC

## 2019-05-14 MED ORDER — CHLORHEXIDINE GLUCONATE CLOTH 2 % EX PADS
6.0000 | MEDICATED_PAD | Freq: Every day | CUTANEOUS | Status: DC
  Administered 2019-05-15 – 2019-05-17 (×3): 6 via TOPICAL

## 2019-05-14 MED ORDER — ZOLEDRONIC ACID 4 MG/5ML IV CONC
4.0000 mg | Freq: Once | INTRAVENOUS | Status: AC
  Administered 2019-05-14: 4 mg via INTRAVENOUS
  Filled 2019-05-14: qty 5

## 2019-05-14 NOTE — Progress Notes (Signed)
ANTICOAGULATION CONSULT NOTE -  Consult  Pharmacy Consult for transitioning from Lovenox to IV heparin Indication: new PE, recent DVT (04/08/2019)  No Known Allergies  Patient Measurements: Height: 5\' 7"  (170.2 cm) Weight: 134 lb (60.8 kg) IBW/kg (Calculated) : 61.6  Vital Signs: Temp: 98.1 F (36.7 C) (01/19 0124) Temp Source: Oral (01/19 0124) BP: 124/74 (01/19 0300) Pulse Rate: 97 (01/19 0300)  Labs: Recent Labs    05/13/19 0407 05/13/19 0505 05/13/19 0505 05/13/19 0655 05/13/19 0815 05/14/19 0448 05/14/19 0515  HGB  --  8.6*   < >  --  8.8* 9.3*  --   HCT  --  26.9*  --   --  28.0* 28.5*  --   PLT  --  302  --   --   --  318  --   APTT  --   --   --   --   --   --  67*  LABPROT  --   --   --   --   --  15.4*  --   INR  --   --   --   --   --  1.2  --   HEPARINUNFRC  --   --   --   --   --   --  0.80*  CREATININE 0.42*  --   --   --   --  0.44  --   TROPONINIHS 48*  --   --  51*  --   --   --    < > = values in this interval not displayed.    Estimated Creatinine Clearance: 69.1 mL/min (by C-G formula based on SCr of 0.44 mg/dL).   Medical History: Past Medical History:  Diagnosis Date  . Bronchitis   . Goals of care, counseling/discussion 04/24/2019  . Hypertension   . Osteoarthritis of metacarpophalangeal (MCP) joint of left thumb   . Pelvic ring fracture ([place]) 09/2016   left   Assessment: 75 y/oF with adenocarcinoma of the right lung with bone and brain metastases on Apixaban 5mg  PO BID PTA for hx of RLE DVT in 03/2019 who [facility] ED on 05/13/2019 with urinary frequency and chest pain. CTa + for PE. Pharmacy consulted for Enoxaparin dosing. Last dose of Apixaban PTA reported as 05/12/2019 at 1800. CBC: Hgb low/decreased to 8.6, Pltc 302K. SCr 0.42 with CrCl > 30 ml/min.  Today: 0515 HL = 0.80 and aptt = 67 sec, aptt at goal, HL still elevated form apixaban, no infusion or bleeding issues per RN.   Goal of Therapy:  Treatment of VTE Monitor  platelets by anticoagulation protocol: Yes   Plan:   Continue heparin drip at 1050 units/hr  Recheck confirmatory aptt and another HL  in 6 hours  Daily HL/aptt  Monitor renal function, CBC at least q72h, and for s/sx of bleeding   Dorrene German 05/14/2019 6:04 AM

## 2019-05-14 NOTE — Progress Notes (Signed)
Patient's husband, Korene Dula, notified of patient's fall.

## 2019-05-14 NOTE — Progress Notes (Signed)
PROGRESS NOTE  [name] MGQ:[id number] DOB: 26-Oct-1955 DOA: 05/13/2019 PCP: [name], MD  Brief History   [name] is a 64 y.o. female with medical history significant of hypertension, DVT on Eliquis, and metastatic adenocarcinoma of the lung on chemotherapy followed by Dr. Marin Olp.  History is obtained from the patient and her husband.  She presents with complaints of increasing urinary frequency that started yesterday afternoon.  Noted to have to urinate every 15 minutes.  She reports associated symptoms of urgency, discomfort with urinating, nonlocalized flank pain, and constipation.  She complains of her pain being uncontrolled despite utilizing hydromorphone 8 mg every 4 hours along with her fentanyl patch.  She has known solitary brain, left scapula/rib metastases which she underwent radiation treatments but did not tolerate it well.  Furthermore, she complained of chest pain that she noted was pleuritic in nature.  Patient had been off of Eliquis for 4 days, to undergo port placement last week.  Her last chemotherapy session was 3 days ago since that time patient has been sick with complaints of nausea.  Denies having any vomiting or fevers.  Husband notes that the patient has not had a bowel movement in several days despite taking medications.  ED Course: On admission into the emergency department patient was seen to be afebrile with heart rates up to 110, and all other vital signs maintained.  Labs significant for WBC 14.6, hemoglobin 8.6, potassium 3.3, CO2 15, calcium 7.7, alkaline phosphatase 143, albumin 2.5, total bilirubin 1.3, and troponin 48.  Urinalysis noted moderate hemoglobin, and 0-5 RBCs, negative nitrites, negative leukocytes, rare bacteria, and 6-10 WBCs.  CT scan of the abdomen and pelvis did reveal signs of a right-sided pyelonephritis and cystitis.  CT angiogram of the chest noted small left-sided pulmonary embolus with small burden and bilateral pleural effusions.   Patient was given 1 L normal saline IV fluids, hydromorphone, and Rocephin for suspected pyelonephritis.  Consultants  . Hematology Oncology . Palliative care  Procedures  . Radiation therapy  Antibiotics   Anti-infectives (From admission, onward)   Start     Dose/Rate Route Frequency Ordered Stop   05/14/19 0800  cefTRIAXone (ROCEPHIN) 2 g in sodium chloride 0.9 % 100 mL IVPB     2 g 200 mL/hr over 30 Minutes Intravenous Every 24 hours 05/13/19 0746     05/13/19 0730  cefTRIAXone (ROCEPHIN) 2 g in sodium chloride 0.9 % 100 mL IVPB     2 g 200 mL/hr over 30 Minutes Intravenous  Once 05/13/19 0721 05/13/19 0848    .   Subjective  The patient is frail, weak, and confused. She is complaining of pain in her back and her right shoulder. She also fell in the ED room earilier today resulting in a goose egg over her right brow. She told palliative care earlier that she had a headache. She tells me that she does not.   Objective   Vitals:  Vitals:   05/14/19 1630 05/14/19 1747  BP: (!) 109/96 132/79  Pulse: 90 89  Resp: 18 19  Temp:  98 F (36.7 C)  SpO2: 95% 93%   Exam:  Constitutional:  . The patient is awake, alert, and slightly confused. Mild distress from back pain. Marland Kitchen FACE: there is swelling and ecchymosis above her right brow. It is tender to touch.  Marland Kitchen HEAD: No other injuries, bumps or bruising seen on head, scalp. Respiratory:  . No increased work of breathing. . No wheezes, rales, or rhonchi .  No tactile fremitus Cardiovascular:  . Regular rate and rhythm . No murmurs, ectopy, or gallups. . No lateral PMI. No thrills. Abdomen:  . Abdomen is soft, non-tender, non-distended . No hernias, masses, or organomegaly . Normoactive bowel sounds.  Musculoskeletal:  . No cyanosis, clubbing, or edema Skin:  . No rashes, lesions, ulcers . palpation of skin: no induration or nodules Neurologic:  . CN 2-12 intact . Sensation all 4 extremities intact Psychiatric:   . Mental status: Confused. o Mood, affect appropriate. Defers to husband for decisions.  I have personally reviewed the following:   Today's Data  . Vitals, CMP, CBC, Prealbumin  Micro Data  . Urine culture: Multiple species.  . Blood cultures x 2: No growth.  Imaging  . CT head: No acute or traumatic findings.  Scheduled Meds: . Chlorhexidine Gluconate Cloth  6 each Topical Daily  . dronabinol  5 mg Oral BID AC  . [START ON 05/16/2019] fentaNYL  1 patch Transdermal Q72H  . folic acid  1 mg Oral Daily  . HYDROmorphone   Intravenous Q4H  . levETIRAcetam  500 mg Oral BID  . pantoprazole  40 mg Oral Q supper  . sodium chloride flush  3 mL Intravenous Q12H   Continuous Infusions: . cefTRIAXone (ROCEPHIN)  IV Stopped (05/14/19 1134)  . heparin 1,050 Units/hr (05/14/19 0001)  . ondansetron (ZOFRAN) IV      Active Problems:   Metastatic cancer to brain [facility])   Adenocarcinoma of lung, stage 4, right (HCC)   Sepsis ([place])   Pulmonary emboli (HCC)   Acute pyelonephritis   Antineoplastic chemotherapy induced anemia   Metabolic acidosis with increased anion gap and accumulation of organic acids   Hypokalemia   Anxiety   LOS: 1 day   A & P  Pulmonary embolus, history of DVT: Acute.  Patient found to have acute left-sided PEs with small burden on CT angiogram of the chest.she has a known history of DVT for which she was on Eliquis in the first place.  She had been taken off of Eliquis 1/10 for port placement, but had restarted taking the medication as advised on 1/14.  Patient had initially been started on full dose Lovenox.  After messaging Dr. Marin Olp it was recommended to place patient on a heparin drip and then ultimately transition to Lovenox instead of keeping her on Eliquis. The patient has been admitted to a telemetry bed. She is on a heparin drip. Echocardiogram has been performed and demonstrated an EF of 60-65%, a small anterior pericardial effusion.  Suspected  SIRS/sepsis secondary to pyelonephritis: Acute.  Patient presented initially tachycardic with WBC 14.6.  Lactic acid was not initially obtained patient met SIRS/sepsis criteria, but appears tachycardia with leukocytosis could also be contributed to pulmonary embolus.  Urinalysis significant for moderate hemoglobin, rare bacteria, negative leukocytes, negative nitrites, and 6-10 WBCs.  However CT scan of the abdomen and pelvis did show concern for right-sided pyelonephritis and cystitis. Urine culture demonstrateed multiple species. Will re-order culture. Blood cultures have had no growth. Continue Rocephin empirically.  Uncontrolled pain, adenocarcinoma of the lung with mets to the brain and rib: Acute.  Patient with metastatic adenocarcinoma with metastases to the left scapula/rib and solitary brain lesion.  Patient has received palliative radiation therapy without improvement in pain symptoms.  Reportedly has been taking Dilaudid 8 mg every 4 hours without relief of pain in addition to her fentanyl patch. Continue fentanyl patch. Continue Dilaudid PCA pump for now with plans to change  her over to MS Contin. Palliative care consulted for symptom management. Continue lactulose and keppra.  Chemotherapy induced anemia: Acute.  Patient's hemoglobin noted be 8.6 on admission, but appears to have been trending down since she received chemotherapy.  Hemoglobin had been 13.1 on 04/14/2019.  She denies any reports of bleeding and therefore suspected chemotherapy-induced anemia. FOBT positive. Monitor H&H.   Hypokalemia: Acute. 3.1 this morning. Supplement and monitor.   Elevated troponin: Acute.  Initial troponin 48 and repeat 51.  Suspect that this is secondary to demand after being found of acute pulmonary embolus.  Metabolic acidosis with elevated anion gap: On admission CO2 noted to be 15 with anion gap mildly elevated at 16 and glucose 93.  Improved at 19 this morning. Unclear cause of the electrolyte  derangement at this time.  Essential hypertension: Blood pressure is well controlled. Patient had no longer been taking amlodipine. Monitor.  Anxiety: Continue Ativan as needed  GERD: Continue Protonix.  I have seen and examined this patient myself. I have spent 42 minutes in her evaluation and care.  DVT prophylaxis: Lovenox Code Status: Full Family Communication: Discussed plan of care with the patient's husband present at bedside. Disposition Plan: Possible discharge home in 2 to 3 days.  Judy Stemmer, DO [facility] Direct contact: see [email or web address]  7PM-7AM contact night coverage as above 05/14/2019, 6:24 PM  LOS: 1 day

## 2019-05-14 NOTE — ED Notes (Signed)
Pt. Documented in error see above note in chart. 

## 2019-05-14 NOTE — Progress Notes (Signed)
[name] for transitioning from Lovenox to IV heparin Indication: new PE, recent DVT (04/08/2019)  No Known Allergies  Patient Measurements: Height: 5\' 7"  (170.2 cm) Weight: 134 lb (60.8 kg) IBW/kg (Calculated) : 61.6  Vital Signs: Temp: 98.2 F (36.8 C) (01/19 0624) Temp Source: Oral (01/19 0624) BP: 109/96 (01/19 1430) Pulse Rate: 96 (01/19 1430)  Labs: Recent Labs    05/13/19 0407 05/13/19 0505 05/13/19 0505 05/13/19 0655 05/13/19 0815 05/14/19 0448 05/14/19 0515 05/14/19 1514  HGB  --  8.6*   < >  --  8.8* 9.3*  --   --   HCT  --  26.9*  --   --  28.0* 28.5*  --   --   PLT  --  302  --   --   --  318  --   --   APTT  --   --   --   --   --   --  67* 57*  LABPROT  --   --   --   --   --  15.4*  --   --   INR  --   --   --   --   --  1.2  --   --   HEPARINUNFRC  --   --   --   --   --   --  0.80* 0.57  CREATININE 0.42*  --   --   --   --  0.44  --   --   TROPONINIHS 48*  --   --  51*  --   --   --   --    < > = values in this interval not displayed.   Estimated Creatinine Clearance: 69.1 mL/min (by C-G formula based on SCr of 0.44 mg/dL).  Medical History: Past Medical History:  Diagnosis Date  . Bronchitis   . Goals of care, counseling/discussion 04/24/2019  . Hypertension   . Osteoarthritis of metacarpophalangeal (MCP) joint of left thumb   . Pelvic ring fracture ([place]) 09/2016   left   Assessment: 50 y/oF with adenocarcinoma of the right lung with bone and brain metastases on Apixaban 5mg  PO BID PTA for hx of RLE DVT in 03/2019 who presents to [facility] ED on 05/13/2019 with urinary frequency and chest pain. CTa + for PE. Pharmacy consulted for Enoxaparin dosing. Last dose of Apixaban PTA reported as 05/12/2019 at 1800. CBC: Hgb low/decreased to 8.6, Pltc 302K. SCr 0.42 with CrCl > 30 ml/min.   Today, 05/14/2019  Heparin infusion started ~ midnight, at 1050 units/hr, no bolus 0515 HL = 0.80 and aPTT = 67 sec, aptt at goal, HL  still elevated from apixaban - no infusion or bleeding issues per RN Rpt levels ordered 1300, drawn 1514: Heparin level 0.57, aPTT 57 sec, levels correlate  Goal of Therapy:  Heparin level 0.3-0.7 units/ml APTT 66-102 sec Monitor platelets by anticoagulation protocol: Yes   Plan:   Continue heparin drip at 1050 units/hr  Recheck Heparin level at 2200  Monitor renal function, CBC at least q72h, and for s/sx of bleeding, daily Heparin level   Minda Ditto PharmD 05/14/2019 3:51 PM

## 2019-05-14 NOTE — Progress Notes (Signed)
Patient repeatedly removing nasal canula despite re-direction. Sats maintained 94-96% on room air. No respiratory distress noted. Will monitor

## 2019-05-14 NOTE — Progress Notes (Signed)
Pt declining PCA pump. Pump and medication removed from patient and room. Night shift provider notified. PRN pain medication requested. Awaiting orders.

## 2019-05-14 NOTE — ED Notes (Signed)
Pt Tx to oncology for radiation treatment

## 2019-05-14 NOTE — Telephone Encounter (Addendum)
Ms. Whiters completed treatment to the Lt shoulder/Lt Chest/ T-5 transverse process and rib isocenter pictured below, but was unfortunately unable to tolerate the planned treatment to the Lt anterior ribs. I called Mr. Gotwalt to let him know that she is on her way back to the ED and that she completed half of the treatment course. He did not answer, so I left a voicemail.     [name] R.T.(R)(T) Radiation Special Procedures Navigator

## 2019-05-14 NOTE — Progress Notes (Signed)
[place] for transitioning from Lovenox to IV heparin Indication: new PE, recent DVT (04/08/2019)  No Known Allergies  Patient Measurements: Height: 5\' 7"  (170.2 cm) Weight: 134 lb (60.8 kg) IBW/kg (Calculated) : 61.6  Vital Signs: Temp: 97.9 F (36.6 C) (01/19 2149) Temp Source: Oral (01/19 2149) BP: 131/77 (01/19 2149) Pulse Rate: 97 (01/19 2149)  Labs: Recent Labs    05/13/19 0407 05/13/19 0505 05/13/19 0505 05/13/19 3614 05/13/19 0815 05/14/19 0448 05/14/19 0515 05/14/19 1514 05/14/19 2148  HGB  --  8.6*   < >  --  8.8* 9.3*  --   --   --   HCT  --  26.9*  --   --  28.0* 28.5*  --   --   --   PLT  --  302  --   --   --  318  --   --   --   APTT  --   --   --   --   --   --  67* 57*  --   LABPROT  --   --   --   --   --  15.4*  --   --   --   INR  --   --   --   --   --  1.2  --   --   --   HEPARINUNFRC  --   --   --   --   --   --  0.80* 0.57 0.37  CREATININE 0.42*  --   --   --   --  0.44  --   --   --   TROPONINIHS 48*  --   --  51*  --   --   --   --   --    < > = values in this interval not displayed.   Estimated Creatinine Clearance: 69.1 mL/min (by C-G formula based on SCr of 0.44 mg/dL).  Medical History: Past Medical History:  Diagnosis Date  . Bronchitis   . Goals of care, counseling/discussion 04/24/2019  . Hypertension   . Osteoarthritis of metacarpophalangeal (MCP) joint of left thumb   . Pelvic ring fracture ([place]) 09/2016   left   Assessment: 46 y/oF with adenocarcinoma of the right lung with bone and brain metastases on Apixaban 5mg  PO BID PTA for hx of RLE DVT in 03/2019 who presents to [facility] ED on 05/13/2019 with urinary frequency and chest pain. CTa + for PE. Pharmacy consulted for Enoxaparin dosing. Last dose of Apixaban PTA reported as 05/12/2019 at 1800. CBC: Hgb low/decreased to 8.6, Pltc 302K. SCr 0.42 with CrCl > 30 ml/min.   Today, 05/14/2019  Heparin infusion started ~ midnight, at 1050 units/hr, no  bolus 0515 HL = 0.80 and aPTT = 67 sec, aptt at goal, HL still elevated from apixaban - no infusion or bleeding issues per RN Rpt levels ordered 1300, drawn 1514: Heparin level 0.57, aPTT 57 sec, levels correlate 2148 HL = 0.37 at goal, no bleeding or infusion issues per RN.    Goal of Therapy:  Heparin level 0.3-0.7 units/ml APTT 66-102 sec Monitor platelets by anticoagulation protocol: Yes   Plan:   Will increase  Heparin drip to 1150 units/hr to make sure stays in range  Recheck HL in 6 hours  Monitor renal function, CBC at least q72h, and for s/sx of bleeding, daily Heparin level   Dorrene German PharmD 05/14/2019 10:53 PM

## 2019-05-14 NOTE — ED Notes (Addendum)
Pt. Found with nasal cannula removed. Nasal cannula placed back on pt. Nose, andshe was encouraged to continue wearing it in order to help her breath.

## 2019-05-14 NOTE — Progress Notes (Signed)
Patient noted to be standing at foot of bed. I instructed patient to sit back down on bed and not get up without assistance. Patient had been previously instructed to call for assistance and not get up without assistance. Patient sat back down on bed. While I was putting on my isolation gown at the room's entrance, the patient stood back up, then sat back on bed but slid off the side of the bed and hit her face on the floor. Sheran Luz MD paged about incident.

## 2019-05-14 NOTE — Consult Note (Signed)
Consultation Note Date: 05/14/2019   Patient Name: [name]  DOB: 1955-12-05  MRN: [id number]  Age / Sex: 64 y.o., female  PCP: [name], MD Referring Physician: Karie Kirks, DO  Reason for Consultation: Establishing goals of care and Psychosocial/spiritual support  HPI/Patient Profile: 64 y.o. female   admitted on 05/13/2019 with past  medical history significant of hypertension, DVT on Eliquis, and metastatic adenocarcinoma of the lung to bone and brain,  followed by Dr. Marin Olp.   She is s/p resection of the brain mass on  04/05/2019.   Anticipated radiation therapy was unsuccessful even before initiation, patient was unable to tolerate secondary to pain.  She has completed one chemotherapy RX,  completed 5 days ago under Dr. Marin Olp.  She has had ongoing significant pain related to her cancer diagnosis.  Home pain management consisted of hydromorphone 8 mg every 4 hours with a 75 mg fentanyl patch.  She has had continued slow physical and functional decline, since her diagnosis in December.  She was admitted through the emergency room with complaints of increasing urinary frequency.   She reported associated symptoms of urgency, discomfort with urinating, nonlocalized flank pain, and constipation.     ED Course: On admission into the emergency department patient was seen to be afebrile with heart rates up to 110, and all other vital signs maintained.  Labs significant for WBC 14.6, hemoglobin 8.6, potassium 3.3, CO2 15, calcium 7.7, alkaline phosphatase 143, albumin 2.5, total bilirubin 1.3, and troponin 48.  Urinalysis noted moderate hemoglobin, and 0-5 RBCs, negative nitrites, negative leukocytes, rare bacteria, and 6-10 WBCs.  CT scan of the abdomen and pelvis did reveal signs of a right-sided pyelonephritis and cystitis.  CT angiogram of the chest noted small left-sided pulmonary embolus with  small burden and bilateral pleural effusions.   Admitted for treatment and stabilization.  Currently she is weak, with intermittent confusion and nausea.   Patient and her  family face treatment option decisions, advanced directive decisions and anticipatory care needs.    Clinical Assessment and Goals of Care:  This NP Wadie Lessen reviewed medical records, received report from team, assessed the patient and then meet at the patient's bedside along with her husband/Eric to discuss current medical situation.   I met Anita and her husband [name] a few weeks ago when she was inpatient CIR rehab.  At that time we discussed the role of palliative medicine within  a holistic treatment plan.  We discussed pain management strategies at that time.  Malachy Mood and [name] share with me that initially after discharge from rehab the patient was doing well.  Doing some home exercises eating well and ambulating.  However since Port-A-Cath was placed it has been "one thing after another".  She was unable to tolerate radiation 2/2 to pain.  Today she slipped out of bed onto the floor and has a bruise on the right side of her outer right  eyebrow.  She is weak and unable to ambulate without assistance.  Appetite is poor.  We discussed diagnosis, prognosis, GOC, EOL wishes disposition and options.    Patient and family feel "conflicted" in  understanding the reality of her diagnosis, viable treatment options and likely outcomes.    Encouraged patient and her husband to discuss with oncologist Dr. Marin Olp tomorrow morning.      We did discuss the difference between an aggressive medical intervention path and a palliative comfort path for this patient at this time in this situation, as well as hospice benefit.   A  discussion was had today regarding advanced directives.    The difference between a aggressive medical intervention path  and a palliative comfort care path for this patient at this time was had.  Values  and goals of care important to patient and family were attempted to be elicited.  Questions and concerns addressed.   Family encouraged to call with questions or concerns.  PMT will continue to supprot holistically.  They are considering a second opinion.  Patient's husband is her HPOA.    They await their attorney's completion of  advanced care planning documents.       SUMMARY OF RECOMMENDATIONS    Code Status/Advance Care Planning:  Full code- dicussed today and  will re-address in the morning  Symptom Management:   Pain: Attending making adjustments, will assess efficacy in am   Palliative Prophylaxis:   Aspiration, Bowel Regimen, Delirium Protocol and Frequent Pain Assessment  Additional Recommendations (Limitations, Scope, Preferences):  Full Scope Treatment  Psycho-social/Spiritual:   Desire for further Chaplaincy support:no  Additional Recommendations: Education on Hospice  Prognosis:   Unable to determine  Discharge Planning: To Be Determined      Primary Diagnoses: Present on Admission: . Sepsis ([place]) . Pulmonary emboli ([place]) . Acute pyelonephritis . Antineoplastic chemotherapy induced anemia . Metabolic acidosis with increased anion gap and accumulation of organic acids . Hypokalemia . Anxiety . Adenocarcinoma of lung, stage 4, right ([place]) . Metastatic cancer to brain [facility])   I have reviewed the medical record, interviewed the patient and family, and examined the patient. The following aspects are pertinent.  Past Medical History:  Diagnosis Date  . Bronchitis   . Goals of care, counseling/discussion 04/24/2019  . Hypertension   . Osteoarthritis of metacarpophalangeal (MCP) joint of left thumb   . Pelvic ring fracture ([place]) 09/2016   left   Social History   Socioeconomic History  . Marital status: Married    Spouse name: Not on file  . Number of children: Not on file  . Years of education: Not on file  . Highest education level: Not  on file  Occupational History  . Not on file  Tobacco Use  . Smoking status: Current Every Day Smoker    Packs/day: 0.50    Years: 25.00    Pack years: 12.50    Types: Cigarettes  . Smokeless tobacco: Never Used  Substance and Sexual Activity  . Alcohol use: No    Alcohol/week: 0.0 standard drinks  . Drug use: No  . Sexual activity: Not Currently    Birth control/protection: Post-menopausal  Other Topics Concern  . Not on file  Social History Narrative  . Not on file   Social Determinants of Health   Financial Resource Strain:   . Difficulty of Paying Living Expenses: Not on file  Food Insecurity:   . Worried About Charity fundraiser in the Last Year: Not on file  . Ran Out of Food in the Last Year: Not on file  Transportation Needs:   . Film/video editor (Medical): Not on file  . Lack of Transportation (Non-Medical): Not on file  Physical Activity:   . Days of Exercise per Week: Not on file  . Minutes of Exercise per Session: Not on file  Stress:   . Feeling of Stress : Not on file  Social Connections:   . Frequency of Communication with Friends and Family: Not on file  . Frequency of Social Gatherings with Friends and Family: Not on file  . Attends Religious Services: Not on file  . Active Member of Clubs or Organizations: Not on file  . Attends Archivist Meetings: Not on file  . Marital Status: Not on file   Family History  Problem Relation Age of Onset  . CAD Father        CABG  . Stroke Father   . Cancer Brother        lymphoma   Scheduled Meds: . dronabinol  5 mg Oral BID AC  . [START ON 05/16/2019] fentaNYL  1 patch Transdermal Q72H  . folic acid  1 mg Oral Daily  . HYDROmorphone   Intravenous Q4H  . levETIRAcetam  500 mg Oral BID  .  morphine injection  2 mg Intramuscular Once  .  morphine injection  4 mg Intravenous Once  . pantoprazole  40 mg Oral Q supper  . sodium chloride flush  3 mL Intravenous Q12H   Continuous  Infusions: . cefTRIAXone (ROCEPHIN)  IV Stopped (05/14/19 1134)  . heparin 1,050 Units/hr (05/14/19 0001)  . ondansetron (ZOFRAN) IV     PRN Meds:.acetaminophen **OR** acetaminophen, albuterol, diphenhydrAMINE **OR** diphenhydrAMINE, HYDROmorphone (DILAUDID) injection, lactulose, lidocaine-prilocaine, LORazepam, methocarbamol, naloxone **AND** sodium chloride flush, ondansetron **OR** ondansetron (ZOFRAN) IV, ondansetron, oxymetazoline, polyvinyl alcohol, prochlorperazine Medications Prior to Admission:  Prior to Admission medications   Medication Sig Start Date End Date Taking? Authorizing Provider  acetaminophen (TYLENOL) 325 MG tablet Take 2 tablets (650 mg total) by mouth every 6 (six) hours. 04/22/19  Yes Love, Ivan Anchors, PA-C  apixaban (ELIQUIS) 5 MG TABS tablet Take 1 tablet (5 mg total) by mouth 2 (two) times daily. After starter pack is used up 05/09/19  Yes Ennever, Rudell Cobb, MD  dronabinol (MARINOL) 5 MG capsule Take 1 capsule (5 mg total) by mouth 2 (two) times daily before lunch and supper. 04/23/19  Yes Love, Ivan Anchors, PA-C  feeding supplement, ENSURE ENLIVE, (ENSURE ENLIVE) LIQD Take 237 mLs by mouth 2 (two) times daily between meals. 04/11/19  Yes Viona [name] D, NP  fentaNYL (DURAGESIC) 75 MCG/HR Place 1 patch onto the skin every 3 (three) days. 05/07/19  Yes Raulkar, Clide Deutscher, MD  folic acid (FOLVITE) 1 MG tablet Take 1 tablet (1 mg total) by mouth daily. Start 5-7 days before Alimta chemotherapy. Continue until 21 days after Alimta completed. 05/08/19  Yes Ennever, Rudell Cobb, MD  HYDROmorphone (DILAUDID) 4 MG tablet Take 1-2 tablets (4-8 mg total) by mouth every 4 (four) hours as needed for moderate pain or severe pain. 05/03/19  Yes Ennever, Rudell Cobb, MD  ketorolac (TORADOL) 10 MG tablet Take 1 tablet (10 mg total) by mouth every 6 (six) hours as needed. Patient taking differently: Take 10 mg by mouth every 6 (six) hours as needed for moderate pain.  05/09/19  Yes Ennever, Rudell Cobb, MD   lactulose (CHRONULAC) 10 GM/15ML solution Take 30 mLs (20 g total) by mouth 2 (two) times daily. Patient taking differently: Take 20 g by  mouth 2 (two) times daily as needed for mild constipation.  05/06/19  Yes Volanda Napoleon, MD  levETIRAcetam (KEPPRA) 500 MG tablet Take 1 tablet (500 mg total) by mouth 2 (two) times daily. 04/22/19  Yes Love, Ivan Anchors, PA-C  LORazepam (ATIVAN) 1 MG tablet Take 1 tablet (1 mg total) by mouth every 6 (six) hours as needed for anxiety. Or nausea or vomiting. 05/09/19  Yes Ennever, Rudell Cobb, MD  methocarbamol (ROBAXIN) 500 MG tablet Take 1 tablet (500 mg total) by mouth every 6 (six) hours as needed for muscle spasms. 05/09/19  Yes Ennever, Rudell Cobb, MD  NARCAN 4 MG/0.1ML LIQD nasal spray kit Place 1 spray into the nose once as needed (overdose).  04/23/19  Yes [provider]  ondansetron (ZOFRAN) 8 MG tablet Take 1 tablet (8 mg total) by mouth 2 (two) times daily as needed (Nausea or vomiting). Start if needed on the third day after chemotherapy. 05/08/19  Yes Ennever, Rudell Cobb, MD  oxymetazoline (AFRIN) 0.05 % nasal spray Place 1 spray into both nostrils 2 (two) times daily as needed for congestion.   Yes [provider]  pantoprazole (PROTONIX) 40 MG tablet Take 1 tablet (40 mg total) by mouth daily with supper. 04/23/19  Yes Love, Ivan Anchors, PA-C  polyvinyl alcohol (LIQUIFILM TEARS) 1.4 % ophthalmic solution Place 1 drop into both eyes as needed for dry eyes.   Yes [provider]  promethazine (PHENERGAN) 12.5 MG tablet Take 1-2 tablets (12.5-25 mg total) by mouth every 4 (four) hours as needed for refractory nausea / vomiting. 05/06/19  Yes Ennever, Rudell Cobb, MD  senna-docusate (SENOKOT-S) 8.6-50 MG tablet Take 1 tablet by mouth 2 (two) times daily. 05/09/19  Yes Ennever, Rudell Cobb, MD  amLODipine (NORVASC) 10 MG tablet TAKE 1 TABLET (10 MG TOTAL) BY MOUTH DAILY. NEED OFFICE VISIT Patient not taking: Reported on 05/13/2019 05/07/19   Pixie Casino, MD  lidocaine-prilocaine (EMLA) cream Apply to affected area once Patient taking differently: Apply 1 application topically as needed (port access). Apply to affected area once 05/08/19   Volanda Napoleon, MD   No Known Allergies Review of Systems  Constitutional: Positive for fatigue.  Musculoskeletal: Positive for back pain.  Neurological: Positive for weakness.  Psychiatric/Behavioral: Positive for confusion.    Physical Exam Constitutional:      Appearance: She is underweight.     Comments: -Intermittently confused to place and situation  Cardiovascular:     Rate and Rhythm: Normal rate.  Skin:    General: Skin is warm and dry.  Neurological:     Mental Status: She is alert.     Vital Signs: BP (!) 109/96   Pulse 96   Temp 98.2 F (36.8 C) (Oral)   Resp 18   Ht _0  (1.702 m)   Wt 60.8 kg   SpO2 94%   BMI 20.99 kg/m  Pain Scale: 0-10   Pain Score: 10-Worst pain ever   SpO2: SpO2: 94 % O2 Device:SpO2: 94 % O2 Flow Rate: .O2 Flow Rate (L/min): 2 L/min  IO: Intake/output summary:   Intake/Output Summary (Last 24 hours) at 05/14/2019 1508 Last data filed at 05/14/2019 1339 Gross per 24 hour  Intake 200 ml  Output --  Net 200 ml    LBM:   Baseline Weight: Weight: 60.8 kg Most recent weight: Weight: 60.8 kg     Palliative Assessment/Data:  30 %    Discussed with Dr Benny Lennert and Dr Marin Olp  Time In: 0930 Time Out: 1045 Time Total: 75 minutes Greater than 50%  of this time was spent counseling and coordinating care related to the above assessment and plan.  Signed by: Wadie Lessen, NP   Please contact Palliative Medicine Team phone at [phone] for questions and concerns.  For individual provider: See Shea Evans

## 2019-05-15 ENCOUNTER — Telehealth: Payer: Self-pay | Admitting: Radiation Therapy

## 2019-05-15 ENCOUNTER — Ambulatory Visit: Payer: BC Managed Care – PPO

## 2019-05-15 ENCOUNTER — Encounter (HOSPITAL_COMMUNITY): Payer: BC Managed Care – PPO

## 2019-05-15 LAB — CBC WITH DIFFERENTIAL/PLATELET
Abs Immature Granulocytes: 0.04 10*3/uL (ref 0.00–0.07)
Basophils Absolute: 0 10*3/uL (ref 0.0–0.1)
Basophils Relative: 0 %
Eosinophils Absolute: 0 10*3/uL (ref 0.0–0.5)
Eosinophils Relative: 0 %
HCT: 25.5 % — ABNORMAL LOW (ref 36.0–46.0)
Hemoglobin: 8.3 g/dL — ABNORMAL LOW (ref 12.0–15.0)
Immature Granulocytes: 1 %
Lymphocytes Relative: 13 %
Lymphs Abs: 0.9 10*3/uL (ref 0.7–4.0)
MCH: 31 pg (ref 26.0–34.0)
MCHC: 32.5 g/dL (ref 30.0–36.0)
MCV: 95.1 fL (ref 80.0–100.0)
Monocytes Absolute: 0.2 10*3/uL (ref 0.1–1.0)
Monocytes Relative: 3 %
Neutro Abs: 5.8 10*3/uL (ref 1.7–7.7)
Neutrophils Relative %: 83 %
Platelets: 258 10*3/uL (ref 150–400)
RBC: 2.68 MIL/uL — ABNORMAL LOW (ref 3.87–5.11)
RDW: 16.3 % — ABNORMAL HIGH (ref 11.5–15.5)
WBC: 7 10*3/uL (ref 4.0–10.5)
nRBC: 0 % (ref 0.0–0.2)

## 2019-05-15 LAB — COMPREHENSIVE METABOLIC PANEL
ALT: 15 U/L (ref 0–44)
AST: 18 U/L (ref 15–41)
Albumin: 2.6 g/dL — ABNORMAL LOW (ref 3.5–5.0)
Alkaline Phosphatase: 143 U/L — ABNORMAL HIGH (ref 38–126)
Anion gap: 15 (ref 5–15)
BUN: 5 mg/dL — ABNORMAL LOW (ref 8–23)
CO2: 23 mmol/L (ref 22–32)
Calcium: 8.5 mg/dL — ABNORMAL LOW (ref 8.9–10.3)
Chloride: 103 mmol/L (ref 98–111)
Creatinine, Ser: 0.36 mg/dL — ABNORMAL LOW (ref 0.44–1.00)
GFR calc Af Amer: >60 mL/min (ref >60)
GFR calc non Af Amer: >60 mL/min (ref >60)
Glucose, Bld: 103 mg/dL — ABNORMAL HIGH (ref 70–99)
Potassium: 3.2 mmol/L — ABNORMAL LOW (ref 3.5–5.1)
Sodium: 141 mmol/L (ref 135–145)
Total Bilirubin: 1.1 mg/dL (ref 0.3–1.2)
Total Protein: 6.5 g/dL (ref 6.5–8.1)

## 2019-05-15 LAB — HEMOGLOBIN AND HEMATOCRIT, BLOOD
HCT: 32.4 % — ABNORMAL LOW (ref 36.0–46.0)
Hemoglobin: 11 g/dL — ABNORMAL LOW (ref 12.0–15.0)

## 2019-05-15 LAB — PREPARE RBC (CROSSMATCH)

## 2019-05-15 LAB — HEPARIN LEVEL (UNFRACTIONATED): Heparin Unfractionated: 0.57 IU/mL (ref 0.30–0.70)

## 2019-05-15 MED ORDER — DIPHENHYDRAMINE HCL 25 MG PO CAPS
25.0000 mg | ORAL_CAPSULE | Freq: Once | ORAL | Status: AC
  Administered 2019-05-15: 25 mg via ORAL
  Filled 2019-05-15: qty 1

## 2019-05-15 MED ORDER — ENOXAPARIN SODIUM 100 MG/ML ~~LOC~~ SOLN
1.5000 mg/kg | SUBCUTANEOUS | Status: DC
  Administered 2019-05-15 – 2019-05-17 (×3): 90 mg via SUBCUTANEOUS
  Filled 2019-05-15 (×3): qty 1

## 2019-05-15 MED ORDER — FENTANYL 100 MCG/HR TD PT72
1.0000 | MEDICATED_PATCH | TRANSDERMAL | Status: DC
  Administered 2019-05-15: 1 via TRANSDERMAL
  Filled 2019-05-15: qty 1

## 2019-05-15 MED ORDER — BOOST / RESOURCE BREEZE PO LIQD CUSTOM
1.0000 | ORAL | Status: DC
  Administered 2019-05-16 – 2019-05-17 (×2): 1 via ORAL

## 2019-05-15 MED ORDER — SODIUM CHLORIDE 0.9% IV SOLUTION: Freq: Once | INTRAVENOUS | Status: AC

## 2019-05-15 MED ORDER — LIP MEDEX EX OINT: TOPICAL_OINTMENT | CUTANEOUS | Status: DC | PRN

## 2019-05-15 MED ORDER — POTASSIUM CHLORIDE 10 MEQ/100ML IV SOLN
INTRAVENOUS | Status: AC
  Filled 2019-05-15: qty 100

## 2019-05-15 MED ORDER — POTASSIUM CHLORIDE 10 MEQ/100ML IV SOLN
10.0000 meq | INTRAVENOUS | Status: AC
  Administered 2019-05-15 (×4): 10 meq via INTRAVENOUS
  Filled 2019-05-15 (×3): qty 100

## 2019-05-15 MED ORDER — MORPHINE SULFATE 15 MG PO TABS
15.0000 mg | ORAL_TABLET | ORAL | Status: DC | PRN
  Administered 2019-05-15 – 2019-05-16 (×4): 15 mg via ORAL
  Filled 2019-05-15 (×4): qty 1

## 2019-05-15 MED ORDER — FUROSEMIDE 10 MG/ML IJ SOLN
20.0000 mg | Freq: Once | INTRAMUSCULAR | Status: AC
  Administered 2019-05-15: 20 mg via INTRAVENOUS
  Filled 2019-05-15: qty 2

## 2019-05-15 MED ORDER — SODIUM CHLORIDE 0.9% FLUSH
10.0000 mL | Freq: Two times a day (BID) | INTRAVENOUS | Status: DC
  Administered 2019-05-15: 10 mL

## 2019-05-15 MED ORDER — SODIUM CHLORIDE 0.9% FLUSH
10.0000 mL | INTRAVENOUS | Status: DC | PRN
  Administered 2019-05-15: 10 mL

## 2019-05-15 MED ORDER — ACETAMINOPHEN 325 MG PO TABS
650.0000 mg | ORAL_TABLET | Freq: Once | ORAL | Status: AC
  Administered 2019-05-15: 650 mg via ORAL
  Filled 2019-05-15: qty 2

## 2019-05-15 MED ORDER — ADULT MULTIVITAMIN W/MINERALS CH
1.0000 | ORAL_TABLET | Freq: Every day | ORAL | Status: DC
  Administered 2019-05-15 – 2019-05-17 (×3): 1 via ORAL
  Filled 2019-05-15 (×3): qty 1

## 2019-05-15 MED ORDER — SULFAMETHOXAZOLE-TRIMETHOPRIM 800-160 MG PO TABS
1.0000 | ORAL_TABLET | Freq: Two times a day (BID) | ORAL | Status: DC
  Administered 2019-05-15 – 2019-05-16 (×4): 1 via ORAL
  Filled 2019-05-15 (×4): qty 1

## 2019-05-15 MED ORDER — ENSURE ENLIVE PO LIQD
237.0000 mL | Freq: Two times a day (BID) | ORAL | Status: DC
  Administered 2019-05-15: 237 mL via ORAL

## 2019-05-15 NOTE — Telephone Encounter (Signed)
I spoke with Mr. [name] to see if [name] would like to come down and finish the treatment she was not able to complete yesterday. He said that she is currently having a blood transfusion, but he will talk to her and see what she wants to do. Sam, Dr. Johny Shears nurse, and the treatment machine are aware of the possibility of her coming back today.    [name] R.T.(R)(T) Radiation Special Procedures Navigator

## 2019-05-15 NOTE — Progress Notes (Signed)
PROGRESS NOTE  [name] OFV:[id number] DOB: Apr 13, 1956 DOA: 05/13/2019 PCP: [name], MD  Brief History   [name] is a 64 y.o. female with medical history significant of hypertension, DVT on Eliquis, and metastatic adenocarcinoma of the lung on chemotherapy followed by Dr. Marin Olp.  History is obtained from the patient and her husband.  She presents with complaints of increasing urinary frequency that started yesterday afternoon.  Noted to have to urinate every 15 minutes.  She reports associated symptoms of urgency, discomfort with urinating, nonlocalized flank pain, and constipation.  She complains of her pain being uncontrolled despite utilizing hydromorphone 8 mg every 4 hours along with her fentanyl patch.  She has known solitary brain, left scapula/rib metastases which she underwent radiation treatments but did not tolerate it well.  Furthermore, she complained of chest pain that she noted was pleuritic in nature.  Patient had been off of Eliquis for 4 days, to undergo port placement last week.  Her last chemotherapy session was 3 days ago since that time patient has been sick with complaints of nausea.  Denies having any vomiting or fevers.  Husband notes that the patient has not had a bowel movement in several days despite taking medications.  ED Course: On admission into the emergency department patient was seen to be afebrile with heart rates up to 110, and all other vital signs maintained.  Labs significant for WBC 14.6, hemoglobin 8.6, potassium 3.3, CO2 15, calcium 7.7, alkaline phosphatase 143, albumin 2.5, total bilirubin 1.3, and troponin 48.  Urinalysis noted moderate hemoglobin, and 0-5 RBCs, negative nitrites, negative leukocytes, rare bacteria, and 6-10 WBCs.  CT scan of the abdomen and pelvis did reveal signs of a right-sided pyelonephritis and cystitis.  CT angiogram of the chest noted small left-sided pulmonary embolus with small burden and bilateral pleural effusions.   Patient was given 1 L normal saline IV fluids, hydromorphone, and Rocephin for suspected pyelonephritis.  The patient was unable to complete her radiation treatment yesterday. She may go to complete it today after her transfusion is completed. The patient was give transfusion of 1 unit PRBC's this morning as recommended by her oncologist, Dr. Marin Olp.  Consultants   Hematology Oncology  Palliative care  Procedures   Radiation therapy  Antibiotics   Anti-infectives (From admission, onward)   Start     Dose/Rate Route Frequency Ordered Stop   05/15/19 1000  sulfamethoxazole-trimethoprim (BACTRIM DS) 800-160 MG per tablet 1 tablet     1 tablet Oral Every 12 hours 05/15/19 0709     05/14/19 0800  cefTRIAXone (ROCEPHIN) 2 g in sodium chloride 0.9 % 100 mL IVPB  Status:  Discontinued     2 g 200 mL/hr over 30 Minutes Intravenous Every 24 hours 05/13/19 0746 05/15/19 0709   05/13/19 0730  cefTRIAXone (ROCEPHIN) 2 g in sodium chloride 0.9 % 100 mL IVPB     2 g 200 mL/hr over 30 Minutes Intravenous  Once 05/13/19 0721 05/13/19 0848      Subjective  The patient is sitting up in bed. She is feeling quite a bit better. No new complaints.  Objective   Vitals:  Vitals:   05/15/19 1215 05/15/19 1417  BP: 104/69 108/70  Pulse: (!) 102 76  Resp: 20 17  Temp: 98.1 F (36.7 C) 98 F (36.7 C)  SpO2: 92% 100%   Exam:  Constitutional:   The patient is awake, alert, and oriented x 3. No acute distress.  FACE: there is swelling and ecchymosis  above her right brow. It is tender to touch. Marland Kitchen Respiratory:   No increased work of breathing.  No wheezes, rales, or rhonchi  No tactile fremitus Cardiovascular:   Regular rate and rhythm  No murmurs, ectopy, or gallups.  No lateral PMI. No thrills. Abdomen:   Abdomen is soft, non-tender, non-distended  No hernias, masses, or organomegaly  Normoactive bowel sounds.  Musculoskeletal:   No cyanosis, clubbing, or edema Skin:    No rashes, lesions, ulcers  palpation of skin: no induration or nodules Neurologic:   CN 2-12 intact  Sensation all 4 extremities intact Psychiatric:   Mental status:  o Mood, affect appropriate. o Fair insight and judgement.  I have personally reviewed the following:   Today's Data   Vitals, CMP, CBC  Micro Data   Urine culture: Multiple species.   Blood cultures x 2: No growth.  Imaging   CT head: No acute or traumatic findings.  Scheduled Meds:  Chlorhexidine Gluconate Cloth  6 each Topical Daily   dronabinol  5 mg Oral BID AC   enoxaparin (LOVENOX) injection  1.5 mg/kg Subcutaneous Q24H   [START ON 05/16/2019] feeding supplement  1 Container Oral Q24H   feeding supplement (ENSURE ENLIVE)  237 mL Oral BID BM   fentaNYL  1 patch Transdermal T46F   folic acid  1 mg Oral Daily   furosemide  20 mg Intravenous Once   levETIRAcetam  500 mg Oral BID   multivitamin with minerals  1 tablet Oral Daily   pantoprazole  40 mg Oral Q supper   sodium chloride flush  3 mL Intravenous Q12H   sulfamethoxazole-trimethoprim  1 tablet Oral Q12H   Continuous Infusions:  ondansetron (ZOFRAN) IV 8 mg (05/14/19 2202)    Active Problems:   Metastatic cancer to brain [facility])   Adenocarcinoma of lung, stage 4, right ([place])   Cancer related pain   Sepsis ([place])   Pulmonary emboli ([facility])   Acute pyelonephritis   Antineoplastic chemotherapy induced anemia   Metabolic acidosis with increased anion gap and accumulation of organic acids   Hypokalemia   Anxiety   LOS: 2 days   A & P  Pulmonary embolus, history of DVT: Acute.  Patient found to have acute left-sided PEs with small burden on CT angiogram of the chest.she has a known history of DVT for which she was on Eliquis in the first place.  She had been taken off of Eliquis 1/10 for port placement, but had restarted taking the medication as advised on 1/14.  Patient had initially been started on full dose Lovenox.   After messaging Dr. Marin Olp it was recommended to place patient on a heparin drip and then ultimately transition to Lovenox instead of keeping her on Eliquis. The patient has been admitted to a telemetry bed. She is on a heparin drip. Echocardiogram has been performed and demonstrated an EF of 60-65%, a small anterior pericardial effusion. Dr. Marin Olp has converted her back to lovenox from heparin.  Suspected SIRS/sepsis secondary to pyelonephritis: Acute.  Patient presented initially tachycardic with WBC 14.6.  Lactic acid was not initially obtained patient met SIRS/sepsis criteria, but appears tachycardia with leukocytosis could also be contributed to pulmonary embolus.  Urinalysis significant for moderate hemoglobin, rare bacteria, negative leukocytes, negative nitrites, and 6-10 WBCs.  However CT scan of the abdomen and pelvis did show concern for right-sided pyelonephritis and cystitis. Urine culture demonstrateed multiple species. Will re-order culture. Blood cultures have had no growth. Continue Rocephin empirically.  Uncontrolled pain, adenocarcinoma of the lung with mets to the brain and rib: Acute.  Patient with metastatic adenocarcinoma with metastases to the left scapula/rib and solitary brain lesion.  Patient has received palliative radiation therapy without improvement in pain symptoms.  Reportedly has been taking Dilaudid 8 mg every 4 hours without relief of pain in addition to her fentanyl patch. This did not seem to be working well for the patient at home. I have discussed the patient with pharmacy. I will continue fentanyl patch, but will increase dose to 100 mcg and use MSIR for breakthrough pain.Will discontinue oral dilaudid. The Dilaudid PCA pump was discontinued last night as the patient did not like it. for now with plans to change her over to MS Contin. Palliative care consulted for symptom management. Continue lactulose and keppra.  Chemotherapy induced anemia: Acute.  Patient's  hemoglobin noted be 8.6 on admission, but appears to have been trending down since she received chemotherapy.  Hemoglobin had been 13.1 on 04/14/2019.  She denies any reports of bleeding and therefore suspected chemotherapy-induced anemia. FOBT positive. Monitor H&H. She is receiving 1 unit of PRBC's in transfusion this morning.  Hypokalemia: Acute. 3.2 this morning. Supplement and monitor.   Elevated troponin: Acute.  Initial troponin 48 and repeat 51.  Suspect that this is secondary to demand after being found of acute pulmonary embolus.  Metabolic acidosis with elevated anion gap: Resolved. On admission CO2 noted to be 15 with anion gap mildly elevated at 16 and glucose 93.  Improved at 23 this morning.  Essential hypertension: Blood pressure is well controlled. Patient had no longer been taking amlodipine. Monitor.  Anxiety: Continue Ativan as needed  GERD: Continue Protonix.  I have seen and examined this patient myself. I have spent 35 minutes in her evaluation and care.  DVT prophylaxis: Lovenox Code Status: Full Family Communication: Discussed plan of care with the patient's husband present at bedside. Disposition Plan: Possible discharge home in 1 to 2 days.  Daylen Lipsky, DO [facility] Direct contact: see [email or web address]  7PM-7AM contact night coverage as above 05/15/2019, 2:48 PM  LOS: 1 day

## 2019-05-15 NOTE — Progress Notes (Signed)
Ms. Seith looks a whole lot better today.  I am just happy about this.  Hopefully, this may have been from a urinary tract infection.  Her CT of the brain looked all right.  There is no brain metastasis.  There is no edema.  Her hemoglobin was 8.3.  I really think that she will benefit from a transfusion.  I think that she needs to have a much higher hemoglobin.  I think with her recent chemotherapy, her hemoglobin will continue to drop.  Her white cell count went from 15,000 down to 7000.  We will have to see what the white cell count is tomorrow.  She is much more alert and oriented.  She is eating a little bit.  She is having no problems with nausea or vomiting.  She has a little bit of diarrhea.  This might be from antibiotics.  Might be from her past chemotherapy.  I do not think this is anything that could be referable to Clostridium difficile.  Pain seems to be doing pretty well right now.  She is on heparin.  We will try to convert her over to Lovenox.  I really think that Lovenox is the best way to try to treat the thromboembolic disease as an outpatient.  I will start her on Lovenox today.  I think we should probably consider getting her on oral antibiotics.  I know that the urine culture has not come back positive as of yet.  However, I think that her symptoms certainly could been referable to a urinary tract infection.  I think Bactrim DS would be reasonable.  Again, she seems to be back to her baseline.  She is alert and oriented.  Her husband is with her.  I had a long talk with him last night.  She had radiation yesterday for the rib lesion.  I am thankful for Radiation Oncology for their incredibly expeditious treatment.  Hopefully this will help with her pain.  I think if we can just get her pain under better control, she will do much better at home.  She and her husband would like to go home tomorrow.  Hopefully this will be manageable.  We have to make sure that she is able to  get Lovenox at home.  We need to make sure that they are shown how to do the Lovenox injections.  Hopefully she will eat a little bit more today.  Her vital signs all look stable.  Her temperature is 97.7.  Pulse 81.  Blood pressure 120/76.  Oxygen saturation is 94%.  Her lungs are clear bilaterally.  Cardiac exam regular rate and rhythm.  Abdomen is soft.  She has decent bowel sounds.  Extremities shows some slight swelling in the right leg.  This is chronic.  She does not have much movement in the right arm.  This also is chronic.  I see that she is on Keppra.  I would like to think that the [place] might be able to be discontinued.  She has never had a seizure.  There is no brain mets or swelling on her recent CT scan of the brain.  I think the less medication that she is on at home the better off she will be in the easier it will be to help care for her at home.  I am very appreciative of the outstanding care that she is getting from all the staff up on 4 E.  As always, the compassion and professionalism of the  staff are being seen every day.  Lattie Haw, MD  Lurena Joiner 1:37

## 2019-05-15 NOTE — Progress Notes (Signed)
Patient ID: [name], female   DOB: March 22, 1956, 64 y.o.   MRN: [id number]  This NP spoke to husband by phone  as a follow up to  yesterday's [place].  He tells me all their concerns have been addressed, "things are going pretty well".  Pain is currently managed, patient is tolerating po intake and cognitively clear. Plan is hopefully discharge home tomorrow.   I offered to meet in the OP setting for ongoing palliative support as they see beneficially.  They have my contact information.   Discussed  the importance of continued conversation with each other  and the  medical providers regarding overall plan of care and treatment options,  ensuring decisions are within the context of the patients values and GOCs.  Questions and concerns addressed    No charge   Wadie Lessen NP  Palliative Medicine Team Team Phone # [phone] Pager [phone]

## 2019-05-15 NOTE — Progress Notes (Signed)
Phoned [facility] to inquire if patient feels up completing her radiation therapy today. [place], RN questioned the patient and her husband with me on the phone. I heard the patient verbalize she did not feel like she could tolerate radiation therapy. The nurse questioned on my behalf if I could check in tomorrow to see if she was feeling up to it. I heard the patient confirm I could. Notified radiation team of these findings.

## 2019-05-15 NOTE — Progress Notes (Signed)
Initial Nutrition Assessment  DOCUMENTATION CODES:   Not applicable  INTERVENTION:  - will order Boost Breeze once/day, each supplement provides 250 kcal and 9 grams of protein. - will order Ensure Enlive BID, each supplement provides 350 kcal and 20 grams of protein. - will order Magic cup TID with meals, each supplement provides 290 kcal and 9 grams of protein - will order daily multivitamin with minerals.    NUTRITION DIAGNOSIS:   Increased nutrient needs related to chronic illness, cancer and cancer related treatments, acute illness(sepsis) as evidenced by estimated needs.  GOAL:   Patient will meet greater than or equal to 90% of their needs  MONITOR:   PO intake, Supplement acceptance, Labs, Weight trends  REASON FOR ASSESSMENT:   Malnutrition Screening Tool  ASSESSMENT:   64 y.o. female with medical history significant of HTN, DVT on Eliquis, and metastatic adenocarcinoma of the lung with single brain met, L scapula, and rib mets. She is on chemotherapy and followed by Dr. Marin Olp. She did undergo radiation but did not tolerate it. She presented to the ED with complaints of increasing urinary frequency that started 1/17; she had to urinate every 15 minutes. She reported associated symptoms of urgency, discomfort with urinating, non-localized flank pain, and constipation.  No intakes documented since admission. Patient sleeping at the time of RD visit and her husband was at bedside and provided all information; he preferred NFPE be deferred so patient may rest. Patient and husband spoke with [facility] RD on 1/12 and that note was reviewed.   Over the past 1 week, patient has been eating and drinking very little although she has been doing better with liquids than with solid foods. Many items and a variety of temperatures and methods of food preparation have been trialed without success.   Patient was able to eat a piece of bacon, a few bites of English muffin for  breakfast yesterday which was the most she has eaten at one time in >1 week. She was able to drink a full bottle of Ensure this AM; she had been sipping on Ensure at home PTA.   Since chemo on 1/15 she has been having diarrhea. Prior to that, she was experiencing constipation 2/2 pain medication. She has been prescribed marinol since ~42/10 but it is uncertain if it has been affective as PO intakes have been negatively impacted by ongoing nausea and intermittent nausea. No anti-emetic prescribed PTA but husband is hopeful a prescription will be provided at the time of d/c.   Per chart review, current weight is 134 lb and weight has been stable x1 month. Patient is at high risk for malnutrition.    Labs reviewed; K: 3.2 mmol/l, BUN: 5 mg/dl, creatinine: 0.36 mg/dl, Ca: 8.5 mg/dl, Alk Phos elevated.  Medications reviewed; 5 mg marinol BID, 1 mg folvite/day, 20 mg IV lasix x1 dose 1/20.     NUTRITION - FOCUSED PHYSICAL EXAM:  unable to complete due to patient sleeping.  Diet Order:   Diet Order            Diet regular Room service appropriate? Yes; Fluid consistency: Thin  Diet effective now              EDUCATION NEEDS:   No education needs have been identified at this time  Skin:  Skin Assessment: Reviewed RN Assessment  Last BM:  1/19  Height:   Ht Readings from Last 1 Encounters:  05/13/19 '5\' 7"'  (1.702 m)    Weight:  Wt Readings from Last 1 Encounters:  05/13/19 60.8 kg    Ideal Body Weight:  61.4 kg  BMI:  Body mass index is 20.99 kg/m.  Estimated Nutritional Needs:   Kcal:  2902-1115 kcal  Protein:  95-110 grams  Fluid:  >/= 2.2 L/day     Jarome Matin, MS, RD, LDN, [facility] Inpatient Clinical Dietitian Pager # [phone] After hours/weekend pager # [phone]

## 2019-05-16 ENCOUNTER — Ambulatory Visit
Admit: 2019-05-16 | Discharge: 2019-05-16 | Disposition: A | Discharge status: Home / Self Care | Payer: BC Managed Care – PPO | Attending: Radiation Oncology | Admitting: Radiation Oncology

## 2019-05-16 ENCOUNTER — Encounter: Payer: Self-pay | Admitting: Radiation Oncology

## 2019-05-16 ENCOUNTER — Ambulatory Visit: Payer: BC Managed Care – PPO

## 2019-05-16 LAB — COMPREHENSIVE METABOLIC PANEL
ALT: 13 U/L (ref 0–44)
AST: 16 U/L (ref 15–41)
Albumin: 2.4 g/dL — ABNORMAL LOW (ref 3.5–5.0)
Alkaline Phosphatase: 121 U/L (ref 38–126)
Anion gap: 13 (ref 5–15)
BUN: 8 mg/dL (ref 8–23)
CO2: 25 mmol/L (ref 22–32)
Calcium: 7.8 mg/dL — ABNORMAL LOW (ref 8.9–10.3)
Chloride: 101 mmol/L (ref 98–111)
Creatinine, Ser: 0.41 mg/dL — ABNORMAL LOW (ref 0.44–1.00)
GFR calc Af Amer: >60 mL/min (ref >60)
GFR calc non Af Amer: >60 mL/min (ref >60)
Glucose, Bld: 97 mg/dL (ref 70–99)
Potassium: 3 mmol/L — ABNORMAL LOW (ref 3.5–5.1)
Sodium: 139 mmol/L (ref 135–145)
Total Bilirubin: 0.4 mg/dL (ref 0.3–1.2)
Total Protein: 5.9 g/dL — ABNORMAL LOW (ref 6.5–8.1)

## 2019-05-16 LAB — CBC WITH DIFFERENTIAL/PLATELET
Abs Immature Granulocytes: 0.04 10*3/uL (ref 0.00–0.07)
Basophils Absolute: 0 10*3/uL (ref 0.0–0.1)
Basophils Relative: 1 %
Eosinophils Absolute: 0 10*3/uL (ref 0.0–0.5)
Eosinophils Relative: 1 %
HCT: 32.3 % — ABNORMAL LOW (ref 36.0–46.0)
Hemoglobin: 10.8 g/dL — ABNORMAL LOW (ref 12.0–15.0)
Immature Granulocytes: 1 %
Lymphocytes Relative: 18 %
Lymphs Abs: 0.6 10*3/uL — ABNORMAL LOW (ref 0.7–4.0)
MCH: 30.9 pg (ref 26.0–34.0)
MCHC: 33.4 g/dL (ref 30.0–36.0)
MCV: 92.3 fL (ref 80.0–100.0)
Monocytes Absolute: 0.4 10*3/uL (ref 0.1–1.0)
Monocytes Relative: 13 %
Neutro Abs: 2.3 10*3/uL (ref 1.7–7.7)
Neutrophils Relative %: 66 %
Platelets: 160 10*3/uL (ref 150–400)
RBC: 3.5 MIL/uL — ABNORMAL LOW (ref 3.87–5.11)
RDW: 15.5 % (ref 11.5–15.5)
WBC: 3.5 10*3/uL — ABNORMAL LOW (ref 4.0–10.5)
nRBC: 0 % (ref 0.0–0.2)

## 2019-05-16 LAB — BASIC METABOLIC PANEL
Anion gap: 11 (ref 5–15)
BUN: 9 mg/dL (ref 8–23)
CO2: 27 mmol/L (ref 22–32)
Calcium: 8.3 mg/dL — ABNORMAL LOW (ref 8.9–10.3)
Chloride: 99 mmol/L (ref 98–111)
Creatinine, Ser: 0.47 mg/dL (ref 0.44–1.00)
GFR calc Af Amer: >60 mL/min (ref >60)
GFR calc non Af Amer: >60 mL/min (ref >60)
Glucose, Bld: 106 mg/dL — ABNORMAL HIGH (ref 70–99)
Potassium: 3.5 mmol/L (ref 3.5–5.1)
Sodium: 137 mmol/L (ref 135–145)

## 2019-05-16 LAB — TYPE AND SCREEN
ABO/RH(D): B NEG
Antibody Screen: NEGATIVE
Unit division: 0
Unit division: 0

## 2019-05-16 LAB — BPAM RBC
Blood Product Expiration Date: 202101252359
Blood Product Expiration Date: 202102012359
ISSUE DATE / TIME: 202101200749
ISSUE DATE / TIME: 202101201123
Unit Type and Rh: 1700
Unit Type and Rh: 1700

## 2019-05-16 MED ORDER — LEVETIRACETAM 500 MG PO TABS
500.0000 mg | ORAL_TABLET | Freq: Every day | ORAL | Status: DC
  Administered 2019-05-16 – 2019-05-17 (×2): 500 mg via ORAL
  Filled 2019-05-16 (×2): qty 1

## 2019-05-16 MED ORDER — TBO-FILGRASTIM 480 MCG/0.8ML ~~LOC~~ SOSY
480.0000 ug | PREFILLED_SYRINGE | Freq: Once | SUBCUTANEOUS | Status: AC
  Administered 2019-05-16: 480 ug via SUBCUTANEOUS
  Filled 2019-05-16: qty 0.8

## 2019-05-16 MED ORDER — POTASSIUM CHLORIDE 10 MEQ/50ML IV SOLN
10.0000 meq | INTRAVENOUS | Status: AC
  Administered 2019-05-16 (×3): 10 meq via INTRAVENOUS
  Filled 2019-05-16 (×3): qty 50

## 2019-05-16 MED ORDER — MORPHINE SULFATE 15 MG PO TABS
15.0000 mg | ORAL_TABLET | ORAL | Status: DC | PRN
  Administered 2019-05-16 – 2019-05-17 (×5): 30 mg via ORAL
  Filled 2019-05-16 (×5): qty 2

## 2019-05-16 NOTE — Progress Notes (Signed)
PROGRESS NOTE  [name] QQV:[id number] DOB: 10-18-1955 DOA: 05/13/2019 PCP: [name], MD  Brief History   [name] is a 64 y.o. female with medical history significant of hypertension, DVT on Eliquis, and metastatic adenocarcinoma of the lung on chemotherapy followed by Dr. Marin Olp.  History is obtained from the patient and her husband.  She presents with complaints of increasing urinary frequency that started yesterday afternoon.  Noted to have to urinate every 15 minutes.  She reports associated symptoms of urgency, discomfort with urinating, nonlocalized flank pain, and constipation.  She complains of her pain being uncontrolled despite utilizing hydromorphone 8 mg every 4 hours along with her fentanyl patch.  She has known solitary brain, left scapula/rib metastases which she underwent radiation treatments but did not tolerate it well.  Furthermore, she complained of chest pain that she noted was pleuritic in nature.  Patient had been off of Eliquis for 4 days, to undergo port placement last week.  Her last chemotherapy session was 3 days ago since that time patient has been sick with complaints of nausea.  Denies having any vomiting or fevers.  Husband notes that the patient has not had a bowel movement in several days despite taking medications.  ED Course: On admission into the emergency department patient was seen to be afebrile with heart rates up to 110, and all other vital signs maintained.  Labs significant for WBC 14.6, hemoglobin 8.6, potassium 3.3, CO2 15, calcium 7.7, alkaline phosphatase 143, albumin 2.5, total bilirubin 1.3, and troponin 48.  Urinalysis noted moderate hemoglobin, and 0-5 RBCs, negative nitrites, negative leukocytes, rare bacteria, and 6-10 WBCs.  CT scan of the abdomen and pelvis did reveal signs of a right-sided pyelonephritis and cystitis.  CT angiogram of the chest noted small left-sided pulmonary embolus with small burden and bilateral pleural effusions.   Patient was given 1 L normal saline IV fluids, hydromorphone, and Rocephin for suspected pyelonephritis.  The patient was unable to complete her radiation treatment yesterday. She may go to complete it today after her transfusion is completed. The patient was give transfusion of 1 unit PRBC's this morning as recommended by her oncologist, Dr. Marin Olp.  Consultants  . Hematology Oncology . Palliative care  Procedures  . Radiation therapy  Antibiotics   Anti-infectives (From admission, onward)   Start     Dose/Rate Route Frequency Ordered Stop   05/15/19 1000  sulfamethoxazole-trimethoprim (BACTRIM DS) 800-160 MG per tablet 1 tablet     1 tablet Oral Every 12 hours 05/15/19 0709     05/14/19 0800  cefTRIAXone (ROCEPHIN) 2 g in sodium chloride 0.9 % 100 mL IVPB  Status:  Discontinued     2 g 200 mL/hr over 30 Minutes Intravenous Every 24 hours 05/13/19 0746 05/15/19 0709   05/13/19 0730  cefTRIAXone (ROCEPHIN) 2 g in sodium chloride 0.9 % 100 mL IVPB     2 g 200 mL/hr over 30 Minutes Intravenous  Once 05/13/19 0721 05/13/19 0848      Subjective  The patient is sitting up in bed. Although she was feeling better this am, upon my visit, the patient's back pain had again become severe, 8/10. She had just received MSIR 15 mg.   Objective   Vitals:  Vitals:   05/16/19 0532 05/16/19 1401  BP: 121/81 115/80  Pulse: 90 81  Resp: 18 15  Temp: 98 F (36.7 C) 97.7 F (36.5 C)  SpO2: 90% 94%   Exam:  Constitutional:  . The patient is  awake, alert, and oriented x 3. Moderate distress from back pain. Marland Kitchen FACE: there is swelling and ecchymosis above her right brow. It is tender to touch. Marland Kitchen Respiratory:  . No increased work of breathing. . No wheezes, rales, or rhonchi . No tactile fremitus Cardiovascular:  . Regular rate and rhythm . No murmurs, ectopy, or gallups. . No lateral PMI. No thrills. Abdomen:  . Abdomen is soft, non-tender, non-distended . No hernias, masses, or  organomegaly . Normoactive bowel sounds.  Musculoskeletal:  . No cyanosis, clubbing, or edema Skin:  . No rashes, lesions, ulcers . palpation of skin: no induration or nodules Neurologic:  . CN 2-12 intact . Sensation all 4 extremities intact Psychiatric:  . Mental status:  o Mood, affect appropriate. o Fair insight and judgement.  I have personally reviewed the following:   Today's Data  . Vitals, CMP, CBC  Micro Data  . Urine culture: Multiple species.  . Blood cultures x 2: No growth.  Imaging  . CT head: No acute or traumatic findings.  Scheduled Meds: . Chlorhexidine Gluconate Cloth  6 each Topical Daily  . dronabinol  5 mg Oral BID AC  . enoxaparin (LOVENOX) injection  1.5 mg/kg Subcutaneous Q24H  . feeding supplement  1 Container Oral Q24H  . feeding supplement (ENSURE ENLIVE)  237 mL Oral BID BM  . fentaNYL  1 patch Transdermal Q72H  . folic acid  1 mg Oral Daily  . levETIRAcetam  500 mg Oral Daily  . multivitamin with minerals  1 tablet Oral Daily  . pantoprazole  40 mg Oral Q supper  . sodium chloride flush  10-40 mL Intracatheter Q12H  . sodium chloride flush  3 mL Intravenous Q12H  . sulfamethoxazole-trimethoprim  1 tablet Oral Q12H   Continuous Infusions: . ondansetron (ZOFRAN) IV 8 mg (05/14/19 2202)    Active Problems:   Metastatic cancer to brain [facility])   Adenocarcinoma of lung, stage 4, right (HCC)   Cancer related pain   Sepsis ([place])   Pulmonary emboli (HCC)   Acute pyelonephritis   Antineoplastic chemotherapy induced anemia   Metabolic acidosis with increased anion gap and accumulation of organic acids   Hypokalemia   Anxiety   LOS: 3 days   A & P  Pulmonary embolus, history of DVT: Acute.  Patient found to have acute left-sided PEs with small burden on CT angiogram of the chest.she has a known history of DVT for which she was on Eliquis in the first place.  She had been taken off of Eliquis 1/10 for port placement, but had restarted  taking the medication as advised on 1/14.  Patient had initially been started on full dose Lovenox.  After messaging Dr. Marin Olp it was recommended to place patient on a heparin drip and then ultimately transition to Lovenox instead of keeping her on Eliquis. The patient has been admitted to a telemetry bed. She is on a heparin drip. Echocardiogram has been performed and demonstrated an EF of 60-65%, a small anterior pericardial effusion. Dr. Marin Olp has converted her back to lovenox from heparin. I have consulted TOC to arrange for this as outpatient.  Suspected SIRS/sepsis secondary to pyelonephritis: Acute.  Patient presented initially tachycardic with WBC 14.6.  Lactic acid was not initially obtained patient met SIRS/sepsis criteria, but appears tachycardia with leukocytosis could also be contributed to pulmonary embolus.  Urinalysis significant for moderate hemoglobin, rare bacteria, negative leukocytes, negative nitrites, and 6-10 WBCs.  However CT scan of the abdomen and  pelvis did show concern for right-sided pyelonephritis and cystitis. Urine culture demonstrateed multiple species. Will re-order culture. Blood cultures have had no growth. Continue Rocephin empirically.  Uncontrolled pain, adenocarcinoma of the lung with mets to the brain and rib: Acute.  Patient with metastatic adenocarcinoma with metastases to the left scapula/rib and solitary brain lesion.  Patient has received palliative radiation therapy without improvement in pain symptoms.  Reportedly has been taking Dilaudid 8 mg every 4 hours without relief of pain in addition to her fentanyl patch. This did not seem to be working well for the patient at home. I have discussed the patient with pharmacy. I will continue fentanyl patch, but will increase dose to 100 mcg and use MSIR for breakthrough pain.Will discontinue oral dilaudid. The Dilaudid PCA pump was discontinued last night as the patient did not like it. for now with plans to change  her over to MS Contin. Palliative care consulted for symptom management. Continue lactulose and keppra. Pain seems uncontrolled with Fentanyl patch 100 mcg with MSIR 15 mg as needed for breakthrough pain. I will increase this to 30 mg and observe for better control.  Chemotherapy induced anemia: Acute.  Patient's hemoglobin noted be 8.6 on admission, but appears to have been trending down since she received chemotherapy.  Hemoglobin had been 13.1 on 04/14/2019.  She denies any reports of bleeding and therefore suspected chemotherapy-induced anemia. FOBT positive. Monitor H&H. She is receiving 1 unit of PRBC's in transfusion this morning. Hemoglobin stable at 10.8 this am.  Hypokalemia: Acute. 3.0 this morning. Supplement and monitor.   Elevated troponin: Acute.  Initial troponin 48 and repeat 51.  Suspect that this is secondary to demand after being found of acute pulmonary embolus.  Metabolic acidosis with elevated anion gap: Resolved. On admission CO2 noted to be 15 with anion gap mildly elevated at 16 and glucose 93.  Improved at 25 this morning.  Essential hypertension: Blood pressure is well controlled. Patient had no longer been taking amlodipine. Monitor.  Anxiety: Continue Ativan as needed  GERD: Continue Protonix.  I have seen and examined this patient myself. I have spent 38 minutes in her evaluation and care.  DVT prophylaxis: Lovenox Code Status: Full Family Communication: Discussed plan of care with the patient's husband present at bedside. Disposition Plan: Possible discharge home tomorrow if pain is better controlled.  Ruhee Enck, DO [facility] Direct contact: see [email or web address]  7PM-7AM contact night coverage as above 05/16/2019, 4:03 PM  LOS: 1 day

## 2019-05-16 NOTE — Progress Notes (Signed)
[facility] Therapy Treatment Record Phone [phone]   Radiation Therapy was administered to [name] on: 05/16/2019  1:29 PM and was treatment # 1 out of a planned course of 1 treatments.  Radiation Treatment  1). Beam photons with 6-10 energy  2). Brachytherapy None  3). Stereotactic Radiosurgery None  4). Other Radiation None     Judy Goodenow F Lilyauna Miedema, RT (T)

## 2019-05-16 NOTE — Progress Notes (Signed)
Ms. Witte looks quite good this morning.  She really does not have a lot of issues.  She is not having any nausea or vomiting.  It seems as if her pain is under pretty good control.  The fentanyl patch was increased up to 100 mcg.  She is on MSIR now.  We have her on Lovenox for the pulmonary embolism.  I believe that she will do well with this as an outpatient.  Hopefully this will not be a problem for her to get.  Her appetite seems to be doing well.  She feels she will be able to eat better once she gets home.  It looks either might be a little bit more strength in the right arm.  She has had no issues with bowels or bladder.  There is no dysuria.  The blood transfusion seemed to help.  Her CBC shows white cell count of 3.5.  Hemoglobin 10.8.  Platelet count 160,000.  The calcium is 7.8 with an albumin of 2.4.  Her potassium is 3.0.  I will give her some runs of potassium this morning.  I also would like to give her a Neupogen injection.  I realize her white cell count is not that low but I feel that it will continue to trend downward given her chemotherapy that she had last week.  Her vital signs all look stable.  Her temperature is 98.  Pulse 90.  Blood pressure 121/81.  Her head neck exam shows no adenopathy in the neck.  There is no oral lesions.  She has good breath sounds bilaterally.  Cardiac exam regular rate and rhythm.  Abdomen is soft.  Bowel sounds are present.  Extremities shows a little bit of better strength in the right arm.  There is no swelling in her right leg.  Neurological exam shows the weakness in the right arm by this appears to be better.  Hopefully, she will be able to go home today.  Going to try to wean her off the [place].  She has never had a problem with seizures.  Her brain scan that she had done on Monday looked fine.  There is no swelling.  I think the less medications that she is on the easy to his for her and her husband.  We MUST make sure that she gets  Lovenox at home.  I will plan to see her back in the office next week for follow-up just that we can make sure that she is doing okay at home.  I appreciate the outstanding care that she is gotten from everybody up on 4 E.  Lattie Haw, MD  Darlyn Chamber 29:11

## 2019-05-16 NOTE — TOC Initial Note (Signed)
Transition of Care [facility]) - Initial/Assessment Note    Patient Details  Name: [name] MRN: [id number] Date of Birth: [birth year]  Transition of Care [facility]) CM/SW Contact:    Dessa Phi, RN Phone Number: 05/16/2019, 2:00 PM  Clinical Narrative: CM referral for benefit check-lovenox-await outcome.                  Expected Discharge Plan: Home/Self Care Barriers to Discharge: Continued Medical Work up   Patient Goals and CMS Choice        Expected Discharge Plan and Services Expected Discharge Plan: Home/Self Care   Discharge Planning Services: CM Consult   Living arrangements for the past 2 months: Single Family Home                                      Prior Living Arrangements/Services Living arrangements for the past 2 months: Single Family Home Lives with:: Spouse Patient language and need for interpreter reviewed:: Yes Do you feel safe going back to the place where you live?: Yes      Need for Family Participation in Patient Care: No (Comment) Care giver support system in place?: Yes (comment)   Criminal Activity/Legal Involvement Pertinent to Current Situation/Hospitalization: No - Comment as needed  Activities of Daily Living Home Assistive Devices/Equipment: Cane (specify quad or straight)(single point cane) ADL Screening (condition at time of admission) Patient's cognitive ability adequate to safely complete daily activities?: Yes Is the patient deaf or have difficulty hearing?: No Does the patient have difficulty seeing, even when wearing glasses/contacts?: No Does the patient have difficulty concentrating, remembering, or making decisions?: No Patient able to express need for assistance with ADLs?: Yes Does the patient have difficulty dressing or bathing?: Yes Independently performs ADLs?: No Communication: Independent Dressing (OT): Needs assistance Is this a change from baseline?: Pre-admission baseline Grooming: Needs assistance Is  this a change from baseline?: Pre-admission baseline Feeding: Needs assistance Is this a change from baseline?: Pre-admission baseline Bathing: Needs assistance Is this a change from baseline?: Pre-admission baseline Toileting: Needs assistance Is this a change from baseline?: Pre-admission baseline In/Out Bed: Needs assistance Is this a change from baseline?: Pre-admission baseline Walks in Home: Needs assistance Is this a change from baseline?: Pre-admission baseline Does the patient have difficulty walking or climbing stairs?: Yes(secondary to pain and weakness) Weakness of Legs: Both Weakness of Arms/Hands: Both  Permission Sought/Granted Permission sought to share information with : Case Manager Permission granted to share information with : Yes, Verbal Permission Granted  Share Information with NAME: Case manager Juliann Pulse           Emotional Assessment Appearance:: Appears stated age Attitude/Demeanor/Rapport: Gracious Affect (typically observed): Accepting Orientation: : Oriented to Self, Oriented to Place, Oriented to  Time, Oriented to Situation Alcohol / Substance Use: Not Applicable Psych Involvement: No (comment)  Admission diagnosis:  Urinary frequency [R35.0] Metastatic cancer to brain ([place]) [C79.31] Pulmonary emboli (HCC) [I26.99] Adenocarcinoma of lung, stage 4, right ([place]) [C34.91] Sepsis ([place]) [A41.9] Other acute pulmonary embolism without acute cor pulmonale ([place]) [I26.99] Lung cancer metastatic to brain ([place]) [C34.90, C79.31] Patient Active Problem List   Diagnosis Date Noted  . Sepsis ([place]) 05/13/2019  . Pulmonary emboli ([place]) 05/13/2019  . Acute pyelonephritis 05/13/2019  . Antineoplastic chemotherapy induced anemia 05/13/2019  . Metabolic acidosis with increased anion gap and accumulation of organic acids 05/13/2019  . Hypokalemia 05/13/2019  . Anxiety  05/13/2019  . Goals of care, counseling/discussion 04/24/2019  . Palliative care by specialist    . DNR (do not resuscitate) discussion   . Cancer related pain   . Leucocytosis   . Hypoalbuminemia due to protein-calorie malnutrition ([place])   . Lung cancer metastatic to brain ([place]) 04/13/2019  . Postoperative pain   . Spasticity   . Acute deep vein thrombosis (DVT) of calf muscle vein of right lower extremity ([place])   . Adenocarcinoma of lung, stage 4, right ([place]) 04/11/2019  . Metastatic cancer to brain ([place]) 04/05/2019  . Brain mass 04/01/2019  . Intermittent explosive disorder 06/13/2016  . Chest pain 01/11/2013  . HYPERCHOLESTEROLEMIA WITH HIGH HDL 04/08/2009  . Essential hypertension 04/08/2009  . TOBACCO ABUSE 11/20/2008   PCP:  [name], MD Pharmacy:   [id number] [place], [place] [address] Phone: [phone] Fax: [place], [place] - [address] [place] [address] Phone: [phone] Fax: [place], [place] - [place] [address] Phone: [phone] Fax: [phone]     Social Determinants of Health (SDOH) Interventions    Readmission Risk Interventions No flowsheet data found.

## 2019-05-16 NOTE — Progress Notes (Signed)
Understanding patient may be discharged today this RN called [facility] to inquire if she is feeling up to finish off radiation treatments at 1. Spoke with Abby, RN who committed to speaking with the patient and phoning this RN back. Awaiting Abby's return call.

## 2019-05-16 NOTE — TOC Benefit Eligibility Note (Signed)
Transition of Care [facility]) Benefit Eligibility Note    Patient Details  Name: [name] MRN: [id number] Date of Birth: 1955/10/21   Medication/Dose: ENOXAPARINE 90 MG SQ Q24HR  Covered?: Yes  Tier: 2 Drug  Prescription Coverage [facility]  Spoke with Person/Company/Phone Number:: LELEJANDRA    @ [employer]  RX # [phone]  Co-Pay: ZERO DOLLARS  Prior Approval: No  Deductible: Met  Additional Notes: LOVENOX   90 MG SQ  Q24HR: NOT COVER , [place] # [phone]    Memory Argue Phone Number: 05/16/2019, 3:06 PM

## 2019-05-17 DIAGNOSIS — R1111 Vomiting without nausea: Secondary | ICD-10-CM

## 2019-05-17 DIAGNOSIS — R350 Frequency of micturition: Secondary | ICD-10-CM

## 2019-05-17 LAB — COMPREHENSIVE METABOLIC PANEL
ALT: 16 U/L (ref 0–44)
AST: 20 U/L (ref 15–41)
Albumin: 2.8 g/dL — ABNORMAL LOW (ref 3.5–5.0)
Alkaline Phosphatase: 129 U/L — ABNORMAL HIGH (ref 38–126)
Anion gap: 13 (ref 5–15)
BUN: 10 mg/dL (ref 8–23)
CO2: 26 mmol/L (ref 22–32)
Calcium: 8.3 mg/dL — ABNORMAL LOW (ref 8.9–10.3)
Chloride: 97 mmol/L — ABNORMAL LOW (ref 98–111)
Creatinine, Ser: 0.53 mg/dL (ref 0.44–1.00)
GFR calc Af Amer: >60 mL/min (ref >60)
GFR calc non Af Amer: >60 mL/min (ref >60)
Glucose, Bld: 110 mg/dL — ABNORMAL HIGH (ref 70–99)
Potassium: 3.6 mmol/L (ref 3.5–5.1)
Sodium: 136 mmol/L (ref 135–145)
Total Bilirubin: 0.8 mg/dL (ref 0.3–1.2)
Total Protein: 6.4 g/dL — ABNORMAL LOW (ref 6.5–8.1)

## 2019-05-17 LAB — CBC WITH DIFFERENTIAL/PLATELET
Abs Immature Granulocytes: 0.32 10*3/uL — ABNORMAL HIGH (ref 0.00–0.07)
Basophils Absolute: 0 10*3/uL (ref 0.0–0.1)
Basophils Relative: 0 %
Eosinophils Absolute: 0 10*3/uL (ref 0.0–0.5)
Eosinophils Relative: 0 %
HCT: 31.3 % — ABNORMAL LOW (ref 36.0–46.0)
Hemoglobin: 10.3 g/dL — ABNORMAL LOW (ref 12.0–15.0)
Immature Granulocytes: 3 %
Lymphocytes Relative: 4 %
Lymphs Abs: 0.5 10*3/uL — ABNORMAL LOW (ref 0.7–4.0)
MCH: 30.7 pg (ref 26.0–34.0)
MCHC: 32.9 g/dL (ref 30.0–36.0)
MCV: 93.2 fL (ref 80.0–100.0)
Monocytes Absolute: 1 10*3/uL (ref 0.1–1.0)
Monocytes Relative: 9 %
Neutro Abs: 9.3 10*3/uL — ABNORMAL HIGH (ref 1.7–7.7)
Neutrophils Relative %: 84 %
Platelets: 120 10*3/uL — ABNORMAL LOW (ref 150–400)
RBC: 3.36 MIL/uL — ABNORMAL LOW (ref 3.87–5.11)
RDW: 15.4 % (ref 11.5–15.5)
WBC: 11.1 10*3/uL — ABNORMAL HIGH (ref 4.0–10.5)
nRBC: 0 % (ref 0.0–0.2)

## 2019-05-17 LAB — URINALYSIS, ROUTINE W REFLEX MICROSCOPIC
Bilirubin Urine: NEGATIVE
Glucose, UA: NEGATIVE mg/dL
Hgb urine dipstick: NEGATIVE
Ketones, ur: NEGATIVE mg/dL
Leukocytes,Ua: NEGATIVE
Nitrite: NEGATIVE
Protein, ur: NEGATIVE mg/dL
Specific Gravity, Urine: 1.011 (ref 1.005–1.030)
pH: 8 (ref 5.0–8.0)

## 2019-05-17 LAB — URINE CULTURE: Culture: NO GROWTH

## 2019-05-17 MED ORDER — ADULT MULTIVITAMIN W/MINERALS CH: 1.0000 | ORAL_TABLET | Freq: Every day | ORAL | 0 refills | Status: AC

## 2019-05-17 MED ORDER — LORATADINE 10 MG PO TABS
10.0000 mg | ORAL_TABLET | Freq: Every day | ORAL | Status: DC
  Administered 2019-05-17: 10 mg via ORAL
  Filled 2019-05-17: qty 1

## 2019-05-17 MED ORDER — FENTANYL 100 MCG/HR TD PT72: 1.0000 | MEDICATED_PATCH | TRANSDERMAL | 0 refills | Status: DC

## 2019-05-17 MED ORDER — HEPARIN SOD (PORK) LOCK FLUSH 100 UNIT/ML IV SOLN
500.0000 [IU] | INTRAVENOUS | Status: AC | PRN
  Administered 2019-05-17: 500 [IU]
  Filled 2019-05-17: qty 5

## 2019-05-17 MED ORDER — LIP MEDEX EX OINT: TOPICAL_OINTMENT | CUTANEOUS | 0 refills | Status: AC | PRN

## 2019-05-17 MED ORDER — METOCLOPRAMIDE HCL 5 MG/ML IJ SOLN: 10.0000 mg | Freq: Once | INTRAMUSCULAR | Status: DC | PRN

## 2019-05-17 MED ORDER — MORPHINE SULFATE 15 MG PO TABS: 15.0000 mg | ORAL_TABLET | ORAL | 0 refills | Status: DC | PRN

## 2019-05-17 MED ORDER — LORATADINE 10 MG PO TABS: 10.0000 mg | ORAL_TABLET | Freq: Every day | ORAL | 0 refills | Status: AC

## 2019-05-17 MED ORDER — ENOXAPARIN SODIUM 100 MG/ML ~~LOC~~ SOLN: 1.5000 mg/kg | SUBCUTANEOUS | 0 refills | Status: DC

## 2019-05-17 NOTE — Progress Notes (Signed)
Pt spouse voiced concern about pts frequent urination, stating he is" afraid her bladder infection has returned and that is what brought her to the hospital". Writer made provider aware. Will continue to monitor.

## 2019-05-17 NOTE — Discharge Summary (Signed)
Physician Discharge Summary  [name] OZY:[id number] DOB: [birth year] DOA: 05/13/2019  PCP: [name], MD  Admit date: 05/13/2019 Discharge date: 05/17/2019  Recommendations for Outpatient Follow-up:  1. Keep appointments for radiation therapy. 2. Follow up with Dr. Marin Olp as directed. 3. Follow up with PCP in 7-10 days.  Discharge Diagnoses: Principal diagnosis is #1 1. Uncontrolled pain due to malignancy 2. Pulmonary embolus despite eliquis at home 3. UTI 4. TME 5. Constipation 6. Adenocardinoma with mets  Discharge Condition: Fair  Disposition: Home  Diet recommendation: Regular  Filed Weights   05/13/19 0324  Weight: 60.8 kg    History of present illness:  [name] is a 64 y.o. female with medical history significant of hypertension, DVT on Eliquis, and metastatic adenocarcinoma of the lung on chemotherapy followed by Dr. Marin Olp.  History is obtained from the patient and her husband.  She presents with complaints of increasing urinary frequency that started yesterday afternoon.  Noted to have to urinate every 15 minutes.  She reports associated symptoms of urgency, discomfort with urinating, nonlocalized flank pain, and constipation.  She complains of her pain being uncontrolled despite utilizing hydromorphone 8 mg every 4 hours along with her fentanyl patch.  She has known solitary brain, left scapula/rib metastases which she underwent radiation treatments but did not tolerate it well.  Furthermore, she complained of chest pain that she noted was pleuritic in nature.  Patient had been off of Eliquis for 4 days, to undergo port placement last week.  Her last chemotherapy session was 3 days ago since that time patient has been sick with complaints of nausea.  Denies having any vomiting or fevers.  Husband notes that the patient has not had a bowel movement in several days despite taking medications.  ED Course: On admission into the emergency department patient was  seen to be afebrile with heart rates up to 110, and all other vital signs maintained.  Labs significant for WBC 14.6, hemoglobin 8.6, potassium 3.3, CO2 15, calcium 7.7, alkaline phosphatase 143, albumin 2.5, total bilirubin 1.3, and troponin 48.  Urinalysis noted moderate hemoglobin, and 0-5 RBCs, negative nitrites, negative leukocytes, rare bacteria, and 6-10 WBCs.  CT scan of the abdomen and pelvis did reveal signs of a right-sided pyelonephritis and cystitis.  CT angiogram of the chest noted small left-sided pulmonary embolus with small burden and bilateral pleural effusions.  Patient was given 1 L normal saline IV fluids, hydromorphone, and Rocephin for suspected pyelonephritis.  Hospital Course: The patient was admitted to a telemetry bed. She was placed on a heparin drip. Her potassium was supplemented and followed. Palliative care was consulted as was her oncologist, Dr. Marin Olp. She was treated with IV antibiotics. Her pain regimen was changed to affect improved pain control. Her Fentanyl patch was increased to 100 mcg q 72 hours, and she was started on MSIR 15 - 30 mg q4h prn breakthrough pain. The patient was given one unit of blood in transfusion as recommended by Dr. Marin Olp. She continued her radiation treatments while inpatient as possible. Dr. Marin Olp changed the patient to Lovenox for discharge to home. This morning the patient felt that her pain was much better controlled. She was discharged to home in fair condition.  Today's assessment: S: The patient is resting comfortably in bed. No new complaints.  O: Vitals:  Vitals:   05/16/19 2051 05/17/19 0628  BP: 107/71 139/81  Pulse: 97 88  Resp: 18 18  Temp: 97.8 F (36.6 C) 97.8 F (36.6 C)  SpO2: 91% 92%   Exam:  Constitutional:  . The patient is awake, alert, and oriented x 3. No acute distress. Respiratory:  . No increased work of breathing. . No wheezes, rales, or rhonchi . No tactile fremitus Cardiovascular:  . Regular  rate and rhythm . No murmurs, ectopy, or gallups. . No lateral PMI. No thrills. Abdomen:  . Abdomen is soft, non-tender, non-distended . No hernias, masses, or organomegaly . Normoactive bowel sounds.  Musculoskeletal:  . No cyanosis, clubbing, or edema Skin:  . No rashes, lesions, ulcers . palpation of skin: no induration or nodules Neurologic:  . CN 2-12 intact . Sensation all 4 extremities intact Psychiatric:  . Mental status o Mood, affect appropriate o Orientation to person, place, time  . judgment and insight appear intact   Discharge Instructions  Discharge Instructions    Activity as tolerated - No restrictions   Complete by: As directed    Call MD for:  difficulty breathing, headache or visual disturbances   Complete by: As directed    Call MD for:  persistant nausea and vomiting   Complete by: As directed    Call MD for:  severe uncontrolled pain   Complete by: As directed    Call MD for:  temperature >100.4   Complete by: As directed    Diet - low sodium heart healthy   Complete by: As directed    Discharge instructions   Complete by: As directed    Keep appointments for radiation therapy. Follow up with Dr. Marin Olp as directed. Follow up with PCP in 7-10 days.   Increase activity slowly   Complete by: As directed      Allergies as of 05/17/2019   No Known Allergies     Medication List    STOP taking these medications   apixaban 5 MG Tabs tablet Commonly known as: ELIQUIS   fentaNYL 75 MCG/HR Commonly known as: DURAGESIC Replaced by: fentaNYL 100 MCG/HR   HYDROmorphone 4 MG tablet Commonly known as: Dilaudid     TAKE these medications   acetaminophen 325 MG tablet Commonly known as: TYLENOL Take 2 tablets (650 mg total) by mouth every 6 (six) hours.   amLODipine 10 MG tablet Commonly known as: NORVASC TAKE 1 TABLET (10 MG TOTAL) BY MOUTH DAILY. NEED OFFICE VISIT   dronabinol 5 MG capsule Commonly known as: MARINOL Take 1 capsule (5  mg total) by mouth 2 (two) times daily before lunch and supper.   enoxaparin 100 MG/ML injection Commonly known as: LOVENOX Inject 0.9 mLs (90 mg total) into the skin daily. Notes to patient: Last Dose of this Medication was given on 05/17/2019 at 9:02 am   feeding supplement (ENSURE ENLIVE) Liqd Take 237 mLs by mouth 2 (two) times daily between meals.   fentaNYL 100 MCG/HR Commonly known as: [place] 1 patch onto the skin every 3 (three) days. Start taking on: May 18, 2019 Replaces: fentaNYL 75 MCG/HR Notes to patient: Fentanyl Patch is to be applied/placed on the skin every three days. Last patch was placed on 05/16/19 at 80:99 pm   folic acid 1 MG tablet Commonly known as: FOLVITE Take 1 tablet (1 mg total) by mouth daily. Start 5-7 days before Alimta chemotherapy. Continue until 21 days after Alimta completed. Notes to patient: This Medication is to start 5-7 days before Alimta Chemotherapy is to begin. 1 Tablet every day and continue until 21 Days after Alimta Chemotherapy is completed   ketorolac 10 MG tablet Commonly  known as: TORADOL Take 1 tablet (10 mg total) by mouth every 6 (six) hours as needed. What changed: reasons to take this   lactulose 10 GM/15ML solution Commonly known as: CHRONULAC Take 30 mLs (20 g total) by mouth 2 (two) times daily. What changed:   when to take this  reasons to take this   levETIRAcetam 500 MG tablet Commonly known as: KEPPRA Take 1 tablet (500 mg total) by mouth 2 (two) times daily. Notes to patient: Last Dose of this Medication was given on 05/17/2019 at 9:02 am Please take Second dose of this Medication at home this Evening   lidocaine-prilocaine cream Commonly known as: EMLA Apply to affected area once What changed:   how much to take  how to take this  when to take this  reasons to take this   lip balm ointment Apply topically as needed for lip care.   loratadine 10 MG tablet Commonly known as:  CLARITIN Take 1 tablet (10 mg total) by mouth daily. Notes to patient: Last Dose of this Medication was given on 05/17/2019 at 9:01 am   LORazepam 1 MG tablet Commonly known as: ATIVAN Take 1 tablet (1 mg total) by mouth every 6 (six) hours as needed for anxiety. Or nausea or vomiting.   methocarbamol 500 MG tablet Commonly known as: ROBAXIN Take 1 tablet (500 mg total) by mouth every 6 (six) hours as needed for muscle spasms.   morphine 15 MG tablet Commonly known as: MSIR Take 1-2 tablets (15-30 mg total) by mouth every 2 (two) hours as needed for severe pain. Notes to patient: This Medication is every 2 hours AS NEEDED for Severe Pain. Last Dose of this Medication was given on 05/17/2019 at 9:01 am   multivitamin with minerals Tabs tablet Take 1 tablet by mouth daily. Notes to patient: Last Dose of this Medication was given on 05/17/2019 at 9:03 am   Narcan 4 MG/0.1ML Liqd nasal spray kit Generic drug: naloxone Place 1 spray into the nose once as needed (overdose).   ondansetron 8 MG tablet Commonly known as: Zofran Take 1 tablet (8 mg total) by mouth 2 (two) times daily as needed (Nausea or vomiting). Start if needed on the third day after chemotherapy.   oxymetazoline 0.05 % nasal spray Commonly known as: AFRIN Place 1 spray into both nostrils 2 (two) times daily as needed for congestion.   pantoprazole 40 MG tablet Commonly known as: PROTONIX Take 1 tablet (40 mg total) by mouth daily with supper. Notes to patient: Please take this Medication this Evening with Supper   polyvinyl alcohol 1.4 % ophthalmic solution Commonly known as: LIQUIFILM TEARS Place 1 drop into both eyes as needed for dry eyes.   promethazine 12.5 MG tablet Commonly known as: PHENERGAN Take 1-2 tablets (12.5-25 mg total) by mouth every 4 (four) hours as needed for refractory nausea / vomiting.   senna-docusate 8.6-50 MG tablet Commonly known as: Senokot-S Take 1 tablet by mouth 2 (two) times  daily.      No Known Allergies  The results of significant diagnostics from this hospitalization (including imaging, microbiology, ancillary and laboratory) are listed below for reference.    Significant Diagnostic Studies: CT HEAD WO CONTRAST  Result Date: 05/14/2019 CLINICAL DATA:  [place] with trauma to the face EXAM: CT HEAD WITHOUT CONTRAST TECHNIQUE: Contiguous axial images were obtained from the base of the skull through the vertex without intravenous contrast. COMPARISON:  04/06/2019 FINDINGS: Brain: No acute or traumatic finding. Chronic  small-vessel ischemic changes affect the pons. No focal cerebellar finding. Cerebral hemispheres show old small vessel infarctions of the thalami, basal ganglia left more than right and hemispheric deep white matter. Previous left frontal vertex craniotomy for tumor resection. No sign of increasing mass, edema or hemorrhage in that region. No hydrocephalus or extra-axial collection. Vascular: There is atherosclerotic calcification of the major vessels at the base of the brain. Skull: No acute or traumatic finding. Sinuses/Orbits: Clear/normal Other: None IMPRESSION: No acute or traumatic finding. Chronic small-vessel ischemic changes throughout the brain as noted above. Previous left frontal vertex craniotomy for tumor resection. No unexpected finding in that location by CT. Electronically Signed   By: Nelson Chimes M.D.   On: 05/14/2019 12:23   CT Angio Chest PE W and/or Wo Contrast  Result Date: 05/13/2019 CLINICAL DATA:  Shortness of breath. Recent diagnosis of metastatic non-small cell cancer. EXAM: CT ANGIOGRAPHY CHEST WITH CONTRAST TECHNIQUE: Multidetector CT imaging of the chest was performed using the standard protocol during bolus administration of intravenous contrast. Multiplanar CT image reconstructions and MIPs were obtained to evaluate the vascular anatomy. CONTRAST:  174m OMNIPAQUE IOHEXOL 350 MG/ML SOLN COMPARISON:  PET CT 7 days ago FINDINGS:  Cardiovascular: Positive for acute pulmonary embolus with filling defect in the lingular and left lower lobar pulmonary arteries. Thromboembolic burden is small. Aortic atherosclerosis without dissection. Heart size normal. Minimal pericardial fluid. Right chest port in place, tip in the atrial caval junction. Mediastinum/Nodes: Multifocal bulky mediastinal and right hilar adenopathy, most prominent involving the right lower paratracheal region. No a conglomerate measures up to 3.6 cm, not significantly changed from prior PET allowing for differences in technique. Right supraclavicular adenopathy with node measuring 2.1 cm. There also enlarged left supraclavicular nodes. No esophageal wall thickening. Lungs/Pleura: Small bilateral pleural effusions have improved from prior PET. Right pleural effusion extends into the fissure. Multiple pulmonary nodules throughout both lungs. Dominant lesion in the right upper lobe extension the hilum towards the apex with surrounding spiculations. Some of these nodules are more conspicuous given decrease in pleural fluid from prior. Otherwise no significant change in nodule since prior PET. Trachea and mainstem bronchi are patent. Upper Abdomen: Assessed on concurrent abdominal CT, reported separately. Musculoskeletal: Osseous metastatic disease, assessed on recent PET. This includes destructive lesion involving left anterior sixth rib. Many of the region of hypermetabolism on PET have faint or no CT correlate. Review of the MIP images confirms the above findings. IMPRESSION: 1. Positive for acute pulmonary embolus in the lingular and left lower lobar pulmonary arteries. Thromboembolic burden is small. 2. Small bilateral pleural effusions, improved since PET CT 1 week ago. 3. Recent diagnosis of non-small cell lung cancer with recent staging PET scan 1 week ago. Multiple pulmonary nodules, multifocal thoracic adenopathy, and osseous metastatic disease. Overall no significant change.  4.  Aortic Atherosclerosis (ICD10-I70.0). Critical Value/emergent results were called by telephone at the time of interpretation on 05/13/2019 at 6:51 am to Dr [facility], who verbally acknowledged these results. Electronically Signed   By: MKeith RakeM.D.   On: 05/13/2019 06:51   CT ABDOMEN PELVIS W CONTRAST  Result Date: 05/13/2019 CLINICAL DATA:  Abdominal pain. Urinary frequency. Right-sided pain. EXAM: CT ABDOMEN AND PELVIS WITH CONTRAST TECHNIQUE: Multidetector CT imaging of the abdomen and pelvis was performed using the standard protocol following bolus administration of intravenous contrast. CONTRAST:  1071mOMNIPAQUE IOHEXOL 350 MG/ML SOLN COMPARISON:  Recent PET CT 05/06/2019, abdominal CT 04/02/2019 FINDINGS: Lower chest: Assessed on  concurrent chest CT. Small pleural effusions. Hepatobiliary: Small low-density lesions in the liver consistent with cyst. There is no additional vague hypodensity in the right lobe measuring 16 mm, previously with lobulated enhancement, likely hemangioma. No new liver lesion. Gallbladder physiologically distended, no calcified stone. No biliary dilatation. Pancreas: No ductal dilatation or inflammation. Spleen: Calcified granuloma. Normal in size. Adrenals/Urinary Tract: Mild left adrenal thickening again seen. Normal right adrenal gland. Heterogeneous enhancement of the right kidney with patchy nephrogram suspicious for pyelonephritis. No hydronephrosis. No renal fluid collection. Small low-density lesion in the upper left kidney is similar to prior exam. Mild urinary bladder wall thickening. No urolithiasis. Stomach/Bowel: Stomach partially distended. No gastric wall thickening. No bowel obstruction or inflammation. Appendix not confidently visualized, no evidence of appendicitis. Vascular/Lymphatic: Aortic atherosclerosis. No aneurysm. 10 mm left periaortic node unchanged from recent PET, series 4, image 37. No new adenopathy. Reproductive: Small uterine fibroid.  No adnexal mass. Other: No ascites. No free air. Musculoskeletal: Destructive lytic lesion within L3 vertebral body, unchanged from recent PET. No new osseous abnormality. IMPRESSION: 1. Findings suspicious for cystitis and right pyelonephritis. 2. Destructive lytic lesion within L3 vertebral body, unchanged from recent PET. Electronically Signed   By: Keith Rake M.D.   On: 05/13/2019 07:00   NM PET Image Initial (PI) Skull Base To Thigh  Result Date: 05/06/2019 CLINICAL DATA:  Initial treatment strategy for metastatic non-small cell lung cancer. EXAM: NUCLEAR MEDICINE PET SKULL BASE TO THIGH TECHNIQUE: 6.6 mCi F-18 FDG was injected intravenously. Full-ring PET imaging was performed from the skull base to thigh after the radiotracer. CT data was obtained and used for attenuation correction and anatomic localization. Fasting blood glucose: 94 mg/dl COMPARISON:  Multiple exams, including CT scans from 04/02/2019 FINDINGS: Mediastinal blood pool activity: SUV max 1.8 Liver activity: SUV max NA NECK: Symmetric glottic activity, maximum SUV 8.1, probably physiologic. Incidental CT findings: Bilateral common carotid atherosclerotic calcification. CHEST: Hypermetabolic bilateral supraclavicular, right paratracheal, left internal mammary, prevascular, subcarinal, right hilar, and right infrahilar adenopathy. A right supraclavicular node measuring 2.2 cm in short axis on image 36/4 (formerly 1.1 cm on 04/02/2027) has a maximum SUV of 19.5. A centrally necrotic right paratracheal lymph node measuring 3.8 cm in short axis on image 49/4 (formerly 0.0 cm) has a maximum SUV 22.5. Bilateral hypermetabolic pulmonary nodules. An index superior segment right lower lobe nodule measures 2.2 by 1.8 cm on image 33/8 (formerly 1.8 by 1.5 cm on 04/02/2019) with maximum SUV 10.4. A left upper lobe nodule measuring 2.0 by 1.6 cm on image 26/8 (formerly 1.7 by 1.5 cm) has a maximum SUV of 15.9. Incidental CT findings: New small to  moderate bilateral pleural effusions. These are nonspecific for malignant involvement. Coronary, aortic arch, and branch vessel atherosclerotic vascular disease. ABDOMEN/PELVIS: A left periaortic node measuring 1.0 cm in short axis on image 113/4 (previously 0.3 cm) has a maximum SUV of 18.6. Scattered activity in bowel, most likely to be physiologic. Subtle activity along the right hepatic lobe capsular margin adjacent to the adrenal gland with maximum SUV of 4.7 compared to background liver activity of 2.9, a small metastatic lesion the liver or conceivably of the for renal gland is raises possibility although no contour abnormality of the right adrenal gland is observed. Incidental CT findings: Aortoiliac atherosclerotic vascular disease. Hypodense lesion anteriorly in the liver for example on image 94/1, not metabolically active, probably benign. SKELETON: Scattered hypermetabolic skeletal lesions in the cervical spine, thoracic spine, ribs, left scapula, lumbar spine,  right iliac bone noted. Index destructive lesion of the left anterior sixth rib associated with surrounding soft tissue mass measuring 3.9 cm in short axis (previously 2.9 cm), maximum SUV 21.9. Index metastatic lesion in the L3 vertebral body is primarily lytic with maximum SUV 18.6. Index metastatic lesion in the right upper acetabulum/iliac bone is primarily mildly sclerotic with maximum SUV 8.7. Overall the appearance of degree of involvement seems increased compared to the bone scan of 04/09/2019. Incidental CT findings: Old healed left pelvic fractures. IMPRESSION: 1. Extensive and enlarging metastatic adenopathy in the chest with numerous enlarging metastatic nodules in the lungs. Scattered increased osseous metastatic lesions. New hypermetabolic left periaortic lymph node in the abdomen. 2. New small to moderate bilateral pleural effusions. 3. Aortic Atherosclerosis (ICD10-I70.0).  Coronary atherosclerosis. Electronically Signed   By:  Van Clines M.D.   On: 05/06/2019 11:55   DG Chest Port 1 View  Result Date: 05/13/2019 CLINICAL DATA:  Chest pain. Recent diagnosis of lung cancer. EXAM: PORTABLE CHEST 1 VIEW COMPARISON:  Radiograph 04/01/2019. PET CT 05/06/2019 reviewed FINDINGS: Patient is rotated. Right chest port with tip in the lower SVC. No pneumothorax. Right pleural effusion. Left pleural effusion on PET CT not well seen. Right paratracheal soft tissue density, likely related to underlying pulmonary mass. Patchy opacity in the left perihilar lung. No pneumothorax. Bone lesions on prior PET are not well demonstrated radiographically. IMPRESSION: 1. Right pleural effusion. Left pleural effusion on recent PET not visualized radiographically. Right paratracheal soft tissue density likely related to known pulmonary mass. 2. Patchy opacities in the left perihilar lung likely correspond pulmonary nodules on PET. 3. Right chest port in place, tip in the SVC. Electronically Signed   By: Keith Rake M.D.   On: 05/13/2019 05:01   IR IMAGING GUIDED PORT INSERTION  Result Date: 05/08/2019 INDICATION: 64 year old female with stage IV metastatic lung cancer including brain metastases. She presents for port catheter placement. EXAM: IMPLANTED PORT A CATH PLACEMENT WITH ULTRASOUND AND FLUOROSCOPIC GUIDANCE MEDICATIONS: 2 g Ancef and 25 mg Benadryl; The antibiotic was administered within an appropriate time interval prior to skin puncture. ANESTHESIA/SEDATION: Versed 2 mg IV; Fentanyl 100 mcg IV; Moderate Sedation Time:  18 minutes The patient was continuously monitored during the procedure by the interventional radiology nurse under my direct supervision. FLUOROSCOPY TIME:  0 minutes, 12 seconds (2 mGy) COMPLICATIONS: None immediate. PROCEDURE: The right neck and chest was prepped with chlorhexidine, and draped in the usual sterile fashion using maximum barrier technique (cap and mask, sterile gown, sterile gloves, large sterile sheet,  hand hygiene and cutaneous antiseptic). Local anesthesia was attained by infiltration with 1% lidocaine with epinephrine. Ultrasound demonstrated patency of the right internal jugular vein, and this was documented with an image. Under real-time ultrasound guidance, this vein was accessed with a 21 gauge micropuncture needle and image documentation was performed. A small dermatotomy was made at the access site with an 11 scalpel. A 0.018" wire was advanced into the SVC and the access needle exchanged for a 53F micropuncture vascular sheath. The 0.018" wire was then removed and a 0.035" wire advanced into the IVC. An appropriate location for the subcutaneous reservoir was selected below the clavicle and an incision was made through the skin and underlying soft tissues. The subcutaneous tissues were then dissected using a combination of blunt and sharp surgical technique and a pocket was formed. A single lumen low-profile power injectable portacatheter was then tunneled through the subcutaneous tissues from the pocket to the  dermatotomy and the port reservoir placed within the subcutaneous pocket. The venous access site was then serially dilated and a peel away vascular sheath placed over the wire. The wire was removed and the port catheter advanced into position under fluoroscopic guidance. The catheter tip is positioned in the superior cavoatrial junction. This was documented with a spot image. The portacatheter was then tested and found to flush and aspirate well. The port was flushed with saline followed by 100 units/mL heparinized saline. The pocket was then closed in two layers using first subdermal inverted interrupted absorbable sutures followed by a running subcuticular suture. The epidermis was then sealed with Dermabond. The dermatotomy at the venous access site was also closed with Dermabond. IMPRESSION: Successful placement of a right IJ approach Power Port with ultrasound and fluoroscopic guidance. The  catheter is ready for use. Electronically Signed   By: Jacqulynn Cadet M.D.   On: 05/08/2019 15:58   VAS [place] LOWER EXTREMITY VENOUS (DVT)  Result Date: 04/21/2019  Lower Venous Study Indications: Swelling.  Comparison Study: 04/08/2019- age indeterminate DVT right femoral, profunda                   femoral, popliteal, posterior tibial, peroneal veins. Performing Technologist: Maudry Mayhew MHA, RDMS, RVT, RDCS  Examination Guidelines: A complete evaluation includes B-mode imaging, spectral Doppler, color Doppler, and power Doppler as needed of all accessible portions of each vessel. Bilateral testing is considered an integral part of a complete examination. Limited examinations for reoccurring indications may be performed as noted.  +---------+---------------+---------+-----------+----------+--------------+ RIGHT    CompressibilityPhasicitySpontaneityPropertiesThrombus Aging +---------+---------------+---------+-----------+----------+--------------+ CFV      Full           Yes      Yes                                 +---------+---------------+---------+-----------+----------+--------------+ SFJ      Full                                                        +---------+---------------+---------+-----------+----------+--------------+ FV Prox  None                                         Acute          +---------+---------------+---------+-----------+----------+--------------+ FV Mid   None                    No                   Acute          +---------+---------------+---------+-----------+----------+--------------+ FV DistalNone                                         Acute          +---------+---------------+---------+-----------+----------+--------------+ PFV      None  Acute          +---------+---------------+---------+-----------+----------+--------------+ POP      None           Yes      Yes                   Acute          +---------+---------------+---------+-----------+----------+--------------+ PTV      None                                         Acute          +---------+---------------+---------+-----------+----------+--------------+ PERO     None                                         Acute          +---------+---------------+---------+-----------+----------+--------------+  +----+---------------+---------+-----------+----------+--------------+ LEFTCompressibilityPhasicitySpontaneityPropertiesThrombus Aging +----+---------------+---------+-----------+----------+--------------+ CFV Full           Yes      Yes                                 +----+---------------+---------+-----------+----------+--------------+  Summary: Right: Findings consistent with acute deep vein thrombosis involving the right femoral vein, right proximal profunda vein, right popliteal vein, right posterior tibial veins, and right peroneal veins. No cystic structure found in the popliteal fossa. Left: No evidence of common femoral vein obstruction. When compared to prior study, right lower extremity veins with thrombus are more dilated during this exam with less echogenic components. This is suggestive of acute changes.  *See table(s) above for measurements and observations. Electronically signed by Monica Martinez MD on 04/21/2019 at 1:49:52 PM.    Final    ECHOCARDIOGRAM LIMITED  Result Date: 05/13/2019   ECHOCARDIOGRAM LIMITED REPORT   Patient Name:   [name] Date of Exam: 05/13/2019 Medical Rec #:  [id number]     Height:       67.0 in Accession #:    [id number]    Weight:       134.0 lb Date of Birth:  02-22-1956      BSA:          1.71 m Patient Age:    50 years      BP:           123/104 mmHg Patient Gender: F             HR:           103 bpm. Exam Location:  Inpatient  Procedure: Limited Echo Indications:    I26.02 Pulmonary embolus  History:        Patient has no prior history of  Echocardiogram examinations.  Sonographer:    Tiffany Dance Referring Phys: [id number] RONDELL A SMITH IMPRESSIONS  1. Only 2 images available, extremely limited, patient refused further imaging. Normal LV size and thickness. Grossly normal LV function, EF looks like 60-65% from parasternal long axis. Very limited RV view appears to show normal systolic function. Small anterior pericardial effusion. FINDINGS  Left Ventricle: Only 2 images available. Normal LV size and thickness. Grossly normal LV function, EF looks like 60-65% from parasternal long axis. Very limited RV view appears to show normal systolic function. Small anterior pericardial effusion.  Dalton  Mclean MD Electronically signed by Loralie Champagne MD Signature Date/Time: 05/13/2019/2:31:39 PM   Final     Microbiology: Recent Results (from the past 240 hour(s))  Urine culture     Status: Abnormal   Collection Time: 05/13/19  4:09 AM   Specimen: Urine, Clean Catch  Result Value Ref Range Status   Specimen Description   Final    URINE, CLEAN CATCH Performed at [facility] [address], [place], [place] [address]    Special Requests   Final    NONE Performed at [facility], [place] [address] [place], [place] [address]    Culture MULTIPLE SPECIES PRESENT, SUGGEST RECOLLECTION (A)  Final   Report Status 05/14/2019 FINAL  Final  Blood culture (routine x 2)     Status: None (Preliminary result)   Collection Time: 05/13/19  4:15 AM   Specimen: BLOOD  Result Value Ref Range Status   Specimen Description   Final    BLOOD PORTA CATH RIGHT Performed at [facility] [address], [place], [place] [address]    Special Requests   Final    BOTTLES DRAWN AEROBIC AND ANAEROBIC Blood Culture adequate volume Performed at [facility] [address] [place], [place] [address]    Culture   Final    NO GROWTH 4 DAYS Performed at [facility], [place] [address] [place], [place] [address]    Report Status PENDING  Incomplete  Blood culture (routine x 2)     Status: None (Preliminary result)   Collection Time: 05/13/19  4:16 AM   Specimen: BLOOD LEFT ARM  Result Value Ref Range Status   Specimen Description   Final    BLOOD LEFT ARM Performed at [facility] [address], [place], [place] [address]    Special Requests   Final    BOTTLES DRAWN AEROBIC AND ANAEROBIC Blood Culture results may not be optimal due to an inadequate volume of blood received in culture bottles Performed at [facility] [address], [place], [place] [address]    Culture   Final    NO GROWTH 4 DAYS Performed at [facility], [place] [address], [place], [place] [address]    Report Status PENDING  Incomplete  SARS CORONAVIRUS 2 (TAT 6-24 HRS) Nasopharyngeal Nasopharyngeal Swab     Status: None   Collection Time: 05/14/19  3:11 AM   Specimen: Nasopharyngeal Swab  Result Value Ref Range Status   SARS Coronavirus 2 NEGATIVE NEGATIVE Final    Comment: (NOTE) SARS-CoV-2 target nucleic acids are NOT DETECTED. The SARS-CoV-2 RNA is generally detectable in upper and lower respiratory specimens during the acute phase of infection. Negative results do not preclude SARS-CoV-2 infection, do not rule out co-infections with other pathogens, and should not be used as the sole basis for treatment or other patient management decisions. Negative results must be combined with clinical observations, patient history, and epidemiological information. The expected result is Negative. Fact Sheet for Patients: SugarRoll.be Fact Sheet for Healthcare Providers: https://www.woods-mathews.com/ This test is not yet approved or cleared by the [place] FDA and  has been authorized for detection and/or diagnosis of SARS-CoV-2 by FDA under an Emergency Use Authorization (EUA). This EUA will remain  in  effect (meaning this test can be used) for the duration of the COVID-19 declaration under Section 56 4(b)(1) of the Act, 21 U.S.C. section 360bbb-3(b)(1), unless the authorization is terminated or  revoked sooner. Performed at [facility], [place] [address], [place], [place] [address]   Urine Culture     Status: None   Collection Time: 05/16/19 11:47 AM   Specimen: Urine, Catheterized  Result Value Ref Range Status   Specimen Description   Final    URINE, CATHETERIZED Performed at [facility] [address] [place], [place] [address]    Special Requests   Final    NONE Performed at [facility], [place] [address], [place], [place] [address]    Culture   Final    NO GROWTH Performed at [facility], [place] [address] [place], [place] [address]    Report Status 05/17/2019 FINAL  Final     Labs: Basic Metabolic Panel: Recent Labs  Lab 05/14/19 0448 05/15/19 0537 05/16/19 0421 05/16/19 1635 05/17/19 0427  NA 139 141 139 137 136  K 3.1* 3.2* 3.0* 3.5 3.6  CL 104 103 101 99 97*  CO2 19* '23 25 27 26  ' GLUCOSE 117* 103* 97 106* 110*  BUN <5* 5* '8 9 10  ' CREATININE 0.44 0.36* 0.41* 0.47 0.53  CALCIUM 8.4* 8.5* 7.8* 8.3* 8.3*   Liver Function Tests: Recent Labs  Lab 05/13/19 0407 05/14/19 0448 05/15/19 0537 05/16/19 0421 05/17/19 0427  AST '25 19 18 16 20  ' ALT '17 15 15 13 16  ' ALKPHOS 143* 157* 143* 121 129*  BILITOT 1.3* 0.9 1.1 0.4 0.8  PROT 5.7* 6.7 6.5 5.9* 6.4*  ALBUMIN 2.5* 2.7* 2.6* 2.4* 2.8*   Recent Labs  Lab 05/13/19 0407  LIPASE 18   No results for input(s): AMMONIA in the last 168 hours. CBC: Recent Labs  Lab 05/13/19 0505 05/13/19 0815 05/14/19 0448 05/15/19 0537 05/15/19 1809 05/16/19 0421 05/17/19 0427  WBC 14.6*  --  15.1* 7.0  --  3.5* 11.1*  NEUTROABS 13.7*  --   --  5.8  --  2.3 9.3*  HGB 8.6*   < > 9.3* 8.3* 11.0* 10.8* 10.3*  HCT 26.9*   < > 28.5* 25.5* 32.4* 32.3* 31.3*  MCV 97.1  --   95.6 95.1  --  92.3 93.2  PLT 302  --  318 258  --  160 120*   < > = values in this interval not displayed.   Cardiac Enzymes: No results for input(s): CKTOTAL, CKMB, CKMBINDEX, TROPONINI in the last 168 hours. BNP: BNP (last 3 results) No results for input(s): BNP in the last 8760 hours.  ProBNP (last 3 results) No results for input(s): PROBNP in the last 8760 hours.  CBG: No results for input(s): GLUCAP in the last 168 hours.  Active Problems:   Metastatic cancer to brain [facility])   Adenocarcinoma of lung, stage 4, right ([facility])   Cancer related pain   Sepsis ([facility])   Pulmonary emboli ([facility])   Acute pyelonephritis   Antineoplastic chemotherapy induced anemia   Metabolic acidosis with increased anion gap and accumulation of organic acids   Hypokalemia   Anxiety   Time coordinating discharge: 38 minutes  Signed:        Cedrik Heindl, DO [facility]  05/17/2019, 3: 53 PM

## 2019-05-17 NOTE — Progress Notes (Signed)
Pt to be discharged to home this afternoon. Pt and Pt's Husband [name] given discharge instructions including all Medications and schedules of these Medications. Understanding Verbalized by pt and Husband. Discharge Packet with Pt and Husband with hard Prescriptions with Pt at time of discharge

## 2019-05-17 NOTE — Progress Notes (Signed)
Pt vomited x2 approximately 528mls of a clear looking bile. Provider made aware. Anti nausea medicine given at this time. Will continue to monitor.

## 2019-05-17 NOTE — Progress Notes (Signed)
Unfortunately, Ms. Hone had some issues yesterday.  She had a flareup of her pain.  I does wonder if this may not of been from the Neupogen that she got.  She has some emesis this morning.  She is a little bit lethargic.  It is hard to say exactly what happened yesterday.  When I saw her yesterday morning she really looks good.  She was ready to go home.  She just has a very low pain threshold.  Her labs look okay.  Potassium is up to 3.6.  She got some runs of potassium yesterday.  She is had no diarrhea.  Her cultures were all negative so far.  She has some urinary frequency.  I will stop the Bactrim.  I do think she really needs this right now.  Her white cell count went up to 11.1.  Hemoglobin 10.3.  Platelet count 120,000.  Her calcium is 8.3 with an albumin of 2.8.  On her physical exam, nothing really seems to be changed from yesterday.  All of her vital signs look stable.  I will give her a dose of Claritin today.  This has been shown to be helpful for bony pain from G-CSF products.  Hopefully, she will be able to go home today.  Again, she has a very very low pain tolerance.  I forgot to mention that she is doing well with the Lovenox injections.  I really appreciate the outstanding care that the staff is giving her up on 4 E.  Lattie Haw, MD [address] [place] 4:8

## 2019-05-18 LAB — CULTURE, BLOOD (ROUTINE X 2)
Culture: NO GROWTH
Culture: NO GROWTH
Special Requests: ADEQUATE

## 2019-05-19 NOTE — Progress Notes (Signed)
  Radiation Oncology         [phone] ________________________________  Name: [name] MRN: [id number]  Date: 05/16/2019  DOB: 10/10/1955  End of Treatment Note  Diagnosis:   64 yo woman with a painful skeletal metastases from adenocarcinoma of the lung - stage IV  Indication for treatment:  Palliation       Radiation treatment dates:   05/14/19 and 05/16/19  Site/dose:    1.  The Left Scapular lesion and T5 transverse process were treated to 8 Gy in one fraction  2.  The left anterior 6th rib metastasis was treated to 8 Gy in one fraction   Beams/energy:       1.  The Left Scapular lesion and T5 transverse process were treated using a 3D field arrangement on 05/14/19 2.  The left anterior 6th rib metastasis was treated using a 2-field 3D technique on 05/16/19  Narrative: The patient tolerated radiation treatment relatively well.   Her left shoulder pain improved.  Plan: The patient has completed radiation treatment. The patient will return to radiation oncology clinic for routine followup in one month. I advised her to call or return sooner if she has any questions or concerns related to her recovery or treatment. ________________________________  Sheral Apley. Tammi Klippel, M.D.

## 2019-05-20 ENCOUNTER — Telehealth: Payer: Self-pay | Admitting: *Deleted

## 2019-05-20 NOTE — Telephone Encounter (Signed)
Call received from patient's husband, [name] to review pt.'s schedule.  Appointments reviewed.  Eric appreciative of assistance and has no further questions at this time.

## 2019-05-21 ENCOUNTER — Encounter: Payer: Self-pay | Admitting: Hematology & Oncology

## 2019-05-21 ENCOUNTER — Encounter: Payer: Self-pay | Admitting: General Practice

## 2019-05-21 ENCOUNTER — Telehealth: Payer: Self-pay | Admitting: Nutrition

## 2019-05-21 ENCOUNTER — Encounter: Payer: BC Managed Care – PPO | Admitting: Nutrition

## 2019-05-21 ENCOUNTER — Encounter: Payer: BC Managed Care – PPO | Admitting: Physical Medicine and Rehabilitation

## 2019-05-21 ENCOUNTER — Other Ambulatory Visit: Payer: Self-pay | Admitting: Hematology & Oncology

## 2019-05-21 DIAGNOSIS — C3490 Malignant neoplasm of unspecified part of unspecified bronchus or lung: Secondary | ICD-10-CM

## 2019-05-21 DIAGNOSIS — C3491 Malignant neoplasm of unspecified part of right bronchus or lung: Secondary | ICD-10-CM

## 2019-05-21 DIAGNOSIS — C7931 Secondary malignant neoplasm of brain: Secondary | ICD-10-CM

## 2019-05-21 MED ORDER — METHOCARBAMOL 500 MG PO TABS: 500.0000 mg | ORAL_TABLET | Freq: Four times a day (QID) | ORAL | 3 refills | Status: AC | PRN

## 2019-05-21 NOTE — Telephone Encounter (Signed)
Telephone follow up completed with patient's husband. Patient receiving chemotherapy for metastatic lung cancer. Reports patient just recently was discharged and did ok over the weekend but had vomiting this morning after drinking coffee. States he wants to purchase an unflavored protein powder for patient to increase her protein intake. Has questions regarding taste alterations.  Nutrition Diagnosis: Food and Nutrition Related Knowledge Deficit continues.  Intervention: Educated regarding strategies on increasing oral intake. Recommended Unjury Protein Powder and provided purchasing information. Encouraged nausea medications as needed before oral intake. Questions answered. Teach back used. Patient has contact information.  Monitoring, Evaluation, Goals: Increase oral intake to minimize weight loss.  Next Visit: To be scheduled as needed.

## 2019-05-21 NOTE — Progress Notes (Signed)
[place] CSW Progress Notes  email from Lubertha Sayres at [facility] as follows: "completed my phone interview with Jesseka Drinkard this morning. I also spoke with her husband, Stephnie Parlier, who told me that someone at [employer] had already told them to file for early retirement with [employer].  They have been in contact with Social Security and were told to file for disability as well.  I understand from Mr. Gowens that his wife was in the hospital last week and therefore, they did not sign the paperwork that I mailed them the week of May 06, 2019.  I explained that until I receive the signed paperwork from them, I cannot file Mrs. Tulloch's claim nor can I review her medical records until I receive our HIPAA form.  He said he understood and will mail back the paperwork today."  [name], [place] Worker Phone:  [phone]

## 2019-05-23 ENCOUNTER — Inpatient Hospital Stay: Payer: BC Managed Care – PPO

## 2019-05-23 ENCOUNTER — Inpatient Hospital Stay (HOSPITAL_BASED_OUTPATIENT_CLINIC_OR_DEPARTMENT_OTHER): Payer: BC Managed Care – PPO | Admitting: Hematology & Oncology

## 2019-05-23 ENCOUNTER — Encounter: Payer: Self-pay | Admitting: Hematology & Oncology

## 2019-05-23 ENCOUNTER — Encounter: Payer: Self-pay | Admitting: *Deleted

## 2019-05-23 ENCOUNTER — Inpatient Hospital Stay: Payer: BC Managed Care – PPO | Admitting: General Practice

## 2019-05-23 ENCOUNTER — Other Ambulatory Visit: Payer: Self-pay

## 2019-05-23 VITALS — BP 105/76 | HR 77 | Temp 97.6°F | Resp 18 | Wt 120.8 lb

## 2019-05-23 DIAGNOSIS — R531 Weakness: Secondary | ICD-10-CM | POA: Diagnosis not present

## 2019-05-23 DIAGNOSIS — Z5111 Encounter for antineoplastic chemotherapy: Secondary | ICD-10-CM | POA: Diagnosis not present

## 2019-05-23 DIAGNOSIS — Z95828 Presence of other vascular implants and grafts: Secondary | ICD-10-CM

## 2019-05-23 DIAGNOSIS — C7931 Secondary malignant neoplasm of brain: Secondary | ICD-10-CM

## 2019-05-23 DIAGNOSIS — C3490 Malignant neoplasm of unspecified part of unspecified bronchus or lung: Secondary | ICD-10-CM

## 2019-05-23 DIAGNOSIS — Z791 Long term (current) use of non-steroidal anti-inflammatories (NSAID): Secondary | ICD-10-CM | POA: Diagnosis not present

## 2019-05-23 DIAGNOSIS — C3411 Malignant neoplasm of upper lobe, right bronchus or lung: Secondary | ICD-10-CM | POA: Diagnosis not present

## 2019-05-23 DIAGNOSIS — Z452 Encounter for adjustment and management of vascular access device: Secondary | ICD-10-CM | POA: Diagnosis not present

## 2019-05-23 DIAGNOSIS — M549 Dorsalgia, unspecified: Secondary | ICD-10-CM | POA: Diagnosis not present

## 2019-05-23 DIAGNOSIS — Z79899 Other long term (current) drug therapy: Secondary | ICD-10-CM | POA: Diagnosis not present

## 2019-05-23 DIAGNOSIS — F17210 Nicotine dependence, cigarettes, uncomplicated: Secondary | ICD-10-CM | POA: Diagnosis not present

## 2019-05-23 DIAGNOSIS — C7951 Secondary malignant neoplasm of bone: Secondary | ICD-10-CM | POA: Diagnosis not present

## 2019-05-23 DIAGNOSIS — C3491 Malignant neoplasm of unspecified part of right bronchus or lung: Secondary | ICD-10-CM

## 2019-05-23 DIAGNOSIS — C7800 Secondary malignant neoplasm of unspecified lung: Secondary | ICD-10-CM | POA: Diagnosis not present

## 2019-05-23 DIAGNOSIS — C7A090 Malignant carcinoid tumor of the bronchus and lung: Secondary | ICD-10-CM | POA: Diagnosis not present

## 2019-05-23 LAB — CBC WITH DIFFERENTIAL (CANCER CENTER ONLY)
Abs Immature Granulocytes: 0.19 10*3/uL — ABNORMAL HIGH (ref 0.00–0.07)
Basophils Absolute: 0 10*3/uL (ref 0.0–0.1)
Basophils Relative: 0 %
Eosinophils Absolute: 0 10*3/uL (ref 0.0–0.5)
Eosinophils Relative: 0 %
HCT: 34.4 % — ABNORMAL LOW (ref 36.0–46.0)
Hemoglobin: 11.2 g/dL — ABNORMAL LOW (ref 12.0–15.0)
Immature Granulocytes: 4 %
Lymphocytes Relative: 15 %
Lymphs Abs: 0.8 10*3/uL (ref 0.7–4.0)
MCH: 30.8 pg (ref 26.0–34.0)
MCHC: 32.6 g/dL (ref 30.0–36.0)
MCV: 94.5 fL (ref 80.0–100.0)
Monocytes Absolute: 0.9 10*3/uL (ref 0.1–1.0)
Monocytes Relative: 17 %
Neutro Abs: 3.5 10*3/uL (ref 1.7–7.7)
Neutrophils Relative %: 64 %
Platelet Count: 463 10*3/uL — ABNORMAL HIGH (ref 150–400)
RBC: 3.64 MIL/uL — ABNORMAL LOW (ref 3.87–5.11)
RDW: 15 % (ref 11.5–15.5)
WBC Count: 5.4 10*3/uL (ref 4.0–10.5)
nRBC: 0 % (ref 0.0–0.2)

## 2019-05-23 LAB — CMP (CANCER CENTER ONLY)
ALT: 14 U/L (ref 0–44)
AST: 23 U/L (ref 15–41)
Albumin: 3.7 g/dL (ref 3.5–5.0)
Alkaline Phosphatase: 144 U/L — ABNORMAL HIGH (ref 38–126)
Anion gap: 10 (ref 5–15)
BUN: 12 mg/dL (ref 8–23)
CO2: 29 mmol/L (ref 22–32)
Calcium: 9 mg/dL (ref 8.9–10.3)
Chloride: 99 mmol/L (ref 98–111)
Creatinine: 0.54 mg/dL (ref 0.44–1.00)
GFR, Est AFR Am: >60 mL/min (ref >60)
GFR, Estimated: >60 mL/min (ref >60)
Glucose, Bld: 99 mg/dL (ref 70–99)
Potassium: 3.5 mmol/L (ref 3.5–5.1)
Sodium: 138 mmol/L (ref 135–145)
Total Bilirubin: 0.3 mg/dL (ref 0.3–1.2)
Total Protein: 6.8 g/dL (ref 6.5–8.1)

## 2019-05-23 MED ORDER — HEPARIN SOD (PORK) LOCK FLUSH 100 UNIT/ML IV SOLN
500.0000 [IU] | Freq: Once | INTRAVENOUS | Status: AC
  Administered 2019-05-23: 13:00:00 500 [IU] via INTRAVENOUS
  Filled 2019-05-23: qty 5

## 2019-05-23 MED ORDER — MORPHINE SULFATE 15 MG PO TABS: 15.0000 mg | ORAL_TABLET | ORAL | 0 refills | Status: DC | PRN

## 2019-05-23 MED ORDER — SODIUM CHLORIDE 0.9% FLUSH
10.0000 mL | Freq: Once | INTRAVENOUS | Status: AC
  Administered 2019-05-23: 10 mL via INTRAVENOUS
  Filled 2019-05-23: qty 10

## 2019-05-23 NOTE — Progress Notes (Signed)
Hematology and Oncology Follow Up Visit  [name] [id number] [birth year] 64 y.o. 05/23/2019   Principle Diagnosis:  Metastatic adenocarcinoma of the lung-brain and bone metastasis -- NO actionable mutations  Current Therapy:    Carboplatinum/Alimta/pembrolizumab-cycle #1-to start on 05/09/2019  Xgeva 120 mg IM q 3 month -- next dose 07/2019     Interim History:  [name] is back for follow-up.  She she was in the hospital last week.  She was neutropenic.  I was quite surprised by this.  I think she had a urinary tract infection.  She really has made a nice come back.  She has more strength in her right arm.  Her weight is down 12 pounds since we saw her but she seems to be eating better right now.  She is on Marinol which seems to be helping.  She is much better with pain control.  I think while she was in the hospital she had a couple of radiation treatments to try to help with the pain.  There has been no bleeding.  She has had no problems with diarrhea.  She has had no vomiting.  She is glad to be home.  She has some dogs.  She really misses her dogs.  There has been no rashes.  She has had no leg swelling.  She currently is on Lovenox now because of the thromboembolic disease.  She was found to have a pulmonary embolism.  She had been on Xarelto.  I switch her over to Lovenox.  We will keep her on Lovenox for right now until we do her next set of scans and see how the thrombi have resolved or responded.  I am just happy that her pain is under much better control.  She just looks better.  She looks more comfortable.  Overall, I would say her performance status is ECOG 1.  Medications:  Current Outpatient Medications:  .  acetaminophen (TYLENOL) 325 MG tablet, Take 2 tablets (650 mg total) by mouth every 6 (six) hours., Disp:  , Rfl:  .  dronabinol (MARINOL) 5 MG capsule, Take 1 capsule (5 mg total) by mouth 2 (two) times daily before lunch and supper., Disp: 60 capsule,  Rfl: 0 .  enoxaparin (LOVENOX) 100 MG/ML injection, Inject 0.9 mLs (90 mg total) into the skin daily., Disp: 270 mL, Rfl: 0 .  feeding supplement, ENSURE ENLIVE, (ENSURE ENLIVE) LIQD, Take 237 mLs by mouth 2 (two) times daily between meals., Disp: 237 mL, Rfl: 12 .  fentaNYL (DURAGESIC) 100 MCG/HR, Place 1 patch onto the skin every 3 (three) days., Disp: 5 patch, Rfl: 0 .  folic acid (FOLVITE) 1 MG tablet, Take 1 tablet (1 mg total) by mouth daily. Start 5-7 days before Alimta chemotherapy. Continue until 21 days after Alimta completed., Disp: 100 tablet, Rfl: 3 .  ketorolac (TORADOL) 10 MG tablet, Take 1 tablet (10 mg total) by mouth every 6 (six) hours as needed. (Patient taking differently: Take 10 mg by mouth every 6 (six) hours as needed for moderate pain. ), Disp: 30 tablet, Rfl: 2 .  lactulose (CHRONULAC) 10 GM/15ML solution, Take 30 mLs (20 g total) by mouth 2 (two) times daily. (Patient taking differently: Take 20 g by mouth 2 (two) times daily as needed for mild constipation. ), Disp: 1000 mL, Rfl: 4 .  levETIRAcetam (KEPPRA) 500 MG tablet, Take 1 tablet (500 mg total) by mouth 2 (two) times daily., Disp: 60 tablet, Rfl: 1 .  lidocaine-prilocaine (EMLA) cream, Apply  to affected area once (Patient taking differently: Apply 1 application topically as needed (port access). Apply to affected area once), Disp: 30 g, Rfl: 3 .  lip balm (CARMEX) ointment, Apply topically as needed for lip care., Disp: 7 g, Rfl: 0 .  loratadine (CLARITIN) 10 MG tablet, Take 1 tablet (10 mg total) by mouth daily., Disp: 30 tablet, Rfl: 0 .  LORazepam (ATIVAN) 1 MG tablet, Take 1 tablet (1 mg total) by mouth every 6 (six) hours as needed for anxiety. Or nausea or vomiting., Disp: 30 tablet, Rfl: 1 .  methocarbamol (ROBAXIN) 500 MG tablet, Take 1 tablet (500 mg total) by mouth every 6 (six) hours as needed for muscle spasms., Disp: 60 tablet, Rfl: 3 .  morphine (MSIR) 15 MG tablet, Take 1-2 tablets (15-30 mg total) by  mouth every 2 (two) hours as needed for severe pain., Disp: 30 tablet, Rfl: 0 .  Multiple Vitamin (MULTIVITAMIN WITH MINERALS) TABS tablet, Take 1 tablet by mouth daily., Disp: 30 tablet, Rfl: 0 .  ondansetron (ZOFRAN) 8 MG tablet, Take 1 tablet (8 mg total) by mouth 2 (two) times daily as needed (Nausea or vomiting). Start if needed on the third day after chemotherapy., Disp: 30 tablet, Rfl: 1 .  oxymetazoline (AFRIN) 0.05 % nasal spray, Place 1 spray into both nostrils 2 (two) times daily as needed for congestion., Disp: , Rfl:  .  pantoprazole (PROTONIX) 40 MG tablet, Take 1 tablet (40 mg total) by mouth daily with supper., Disp: 30 tablet, Rfl: 1 .  polyvinyl alcohol (LIQUIFILM TEARS) 1.4 % ophthalmic solution, Place 1 drop into both eyes as needed for dry eyes., Disp: , Rfl:  .  promethazine (PHENERGAN) 12.5 MG tablet, TAKE 1-2 TABLETS BY MOUTH EVERY 4 (FOUR) HOURS AS NEEDED FOR REFRACTORY NAUSEA / VOMITING., Disp: 30 tablet, Rfl: 3 .  senna-docusate (SENOKOT-S) 8.6-50 MG tablet, Take 1 tablet by mouth 2 (two) times daily., Disp: , Rfl:  .  amLODipine (NORVASC) 10 MG tablet, TAKE 1 TABLET (10 MG TOTAL) BY MOUTH DAILY. NEED OFFICE VISIT (Patient not taking: Reported on 05/13/2019), Disp: 90 tablet, Rfl: 0 .  NARCAN 4 MG/0.1ML LIQD nasal spray kit, Place 1 spray into the nose once as needed (overdose). , Disp: , Rfl:  No current facility-administered medications for this visit.  Facility-Administered Medications Ordered in Other Visits:  .  heparin lock flush 100 unit/mL, 500 Units, Intravenous, Once, Grayling Schranz R, MD .  sodium chloride flush (NS) 0.9 % injection 10 mL, 10 mL, Intravenous, Once, Fran Mcree, Rudell Cobb, MD  Allergies:  No Known Allergies  Past Medical History, Surgical history, Social history, and Family History were reviewed and updated.  Review of Systems: Review of Systems  Constitutional: Negative.   HENT:  Negative.   Eyes: Negative.   Respiratory: Negative.     Cardiovascular: Negative.   Gastrointestinal: Negative.   Endocrine: Negative.   Genitourinary: Negative.    Musculoskeletal: Positive for arthralgias and back pain.  Skin: Negative.   Neurological: Positive for extremity weakness.  Hematological: Negative.   Psychiatric/Behavioral: Negative.     Physical Exam: Fairly well-developed well-nourished white female in mild distress secondary to pain.  Vital signs show temperature of 97.3.  Pulse 93.  Blood pressure 104/81.  Weight 232 pounds.  Head neck exam shows no ocular or oral lesions.  There are no palpable cervical or supraclavicular lymph nodes.  Lungs are clear bilaterally.  Cardiac exam regular rate and rhythm with no murmurs, rubs or bruits.  Abdomen is soft.  Bowel sounds are slightly decreased.  She has no fluid wave.  There is no palpable liver or spleen tip.  Back exam shows some tenderness in the left scapula.  This is in the inferior portion of the scapula.  There is no tenderness over the actual spine or hips.  Extremities shows weakness in the right arm.  This is chronic.  Neurological exam shows the weakness in the right arm.  Skin exam shows no rashes, ecchymoses or petechia.    weight is 120 lb 12 oz (54.8 kg). Her temporal temperature is 97.6 F (36.4 C). Her blood pressure is 105/76 and her pulse is 77. Her respiration is 18 and oxygen saturation is 100%.   Wt Readings from Last 3 Encounters:  05/23/19 120 lb 12 oz (54.8 kg)  05/13/19 134 lb (60.8 kg)  05/03/19 132 lb (59.9 kg)    Physical Exam   Lab Results  Component Value Date   WBC 5.4 05/23/2019   HGB 11.2 (L) 05/23/2019   HCT 34.4 (L) 05/23/2019   MCV 94.5 05/23/2019   PLT 463 (H) 05/23/2019     Chemistry      Component Value Date/Time   NA 138 05/23/2019 1043   K 3.5 05/23/2019 1043   CL 99 05/23/2019 1043   CO2 29 05/23/2019 1043   BUN 12 05/23/2019 1043   CREATININE 0.54 05/23/2019 1043      Component Value Date/Time   CALCIUM 9.0 05/23/2019  1043   ALKPHOS 144 (H) 05/23/2019 1043   AST 23 05/23/2019 1043   ALT 14 05/23/2019 1043   BILITOT 0.3 05/23/2019 1043       Impression and Plan: [name] is a 64 year old yo female.  She is or was a smoker.  She now has metastatic adenocarcinoma of the right lung.  Our goal at this point is her quality of life.  I am just glad that she is feeling better.  I feel bad that she had to be in the hospital.  However, she was able to have good management from several doctors which was helpful.  I do think that it would be worthwhile to go with the second cycle of treatment.  I have to believe that treatment is working for her.  I am going to give her an extra week off of treatment.  We will have her come back in 2 weeks and we will start cycle #2.  I am making a dosage adjustment.  She probably will need Neulasta with her next cycle of chemotherapy.  I will also make sure that we have her on prophylactic antibiotics.  I spent about 45 minutes with she and her husband.  This was a little complicated.  Thankfully she looks better.  I had to make adjustments to her protocol.  I had to make sure we had the right medications ordered for her.  I did call in some medications to her pharmacy.   Volanda Napoleon, MD 1/28/202112:07 PM

## 2019-05-23 NOTE — Patient Instructions (Signed)
Tunneled Central Venous Catheter Flushing Guide  It is important to flush your tunneled central venous catheter each time you use it, both before and after you use it. Flushing your catheter will help prevent it from clogging. What are the risks? Risks may include:  Infection.  Air getting into the catheter and bloodstream. Supplies needed:  A clean pair of gloves.  A disinfecting wipe. Use an alcohol wipe, chlorhexidine wipe, or iodine wipe as told by your health care provider.  A 10 mL syringe that has been prefilled with saline solution.  An empty 10 mL syringe, if a substance called heparin was injected into your catheter. How to flush your catheter When you flush your catheter, make sure you follow any specific instructions from your health care provider or the manufacturer. These are general guidelines. Flushing your catheter before use If there is heparin in your catheter: 1. Wash your hands with soap and water. 2. Put on gloves. 3. Scrub the injection cap for a minimum of 15 seconds with a disinfecting wipe. 4. Unclamp the catheter. 5. Attach the empty syringe to the injection cap. 6. Pull the syringe plunger back and withdraw 10 mL of blood. 7. Place the syringe into an appropriate waste container. 8. Scrub the injection cap for 15 seconds with a disinfecting wipe. 9. Attach the prefilled syringe to the injection cap. 10. Flush the catheter by pushing the plunger forward until all the liquid from the syringe is in the catheter. 11. Remove the syringe from the injection cap. 12. Clamp the catheter. If there is no heparin in your catheter: 1. Wash your hands with soap and water. 2. Put on gloves. 3. Scrub the injection cap for 15 seconds with a disinfecting wipe. 4. Unclamp the catheter. 5. Attach the prefilled syringe to the injection cap. 6. Flush the catheter by pushing the plunger forward until 5 mL of the liquid from the syringe is in the catheter. 7. Pull back on  the syringe until you see blood in the catheter. 8. If you have been asked to collect any blood, follow your health care provider's instructions. Otherwise, flush the catheter with the rest of the solution from the syringe. 9. Remove the syringe from the injection cap. 10. Clamp the catheter.  Flushing your catheter after use 1. Wash your hands with soap and water. 2. Put on gloves. 3. Scrub the injection cap for 15 seconds with a disinfecting wipe. 4. Unclamp the catheter. 5. Attach the prefilled syringe to the injection cap. 6. Flush the catheter by pushing the plunger forward until all of the liquid from the syringe is in the catheter. 7. Remove the syringe from the injection cap. 8. Clamp the catheter. Problems and solutions  If blood cannot be completely cleared from the injection cap, you may need to have the injection cap replaced.  If the catheter is difficult to flush, use the pulsing method. The pulsing method involves pushing only a few milliliters of solution into the catheter at a time and pausing between pushes.  If you do not see blood in the catheter when you pull back on the syringe, change your body position, such as by raising your arms above your head. Take a deep breath and cough. Then, pull back on the syringe. If you still do not see blood, flush the catheter with a small amount of solution. Then, change positions again and take a breath or cough. Pull back on the syringe again. If you still do not see   blood, finish flushing the catheter and contact your health care provider. Do not use your catheter until your health care provider says it is okay. General tips  Have someone help you flush your catheter, if possible.  Do not force fluid through your catheter.  Do not use a syringe that is larger or smaller than 10 mL. Using a smaller syringe can make the catheter burst.  Do not use your catheter without flushing it first if it has heparin in it. Contact a health  care provider if:  You cannot see any blood in the catheter when you flush it before using it.  Your catheter is difficult to flush. Get help right away if:  You cannot flush the catheter.  The catheter leaks when you flush it or when there is fluid in it.  There are cracks or breaks in the catheter. Summary  It is important to flush your tunneled central venous catheter each time you use it, both before and after you use it.  Scrub the injection cap for 15 seconds with a disinfecting wipe before and after you flush it.  When you flush your catheter, make sure you follow any specific instructions from your health care provider or the manufacturer.  Get help right away if you cannot flush the catheter. This information is not intended to replace advice given to you by your health care provider. Make sure you discuss any questions you have with your health care provider. Document Revised: 01/04/2019 Document Reviewed: 06/27/2018 Elsevier Patient Education  2020 [employer]  

## 2019-05-23 NOTE — Progress Notes (Signed)
Patient is here with her husband after an unanticipated hospitalization after her last treatment. They are here to discuss future plans and treatment. Patient appeared much improved from previous visits, however after her port was accessed, she suddenly became very nauseous and had to go to the bathroom and vomit.   Husband, [name], states things have been better. Pain in her ribs is better controlled after radiation in the hospital. Her back continues to be painful. She's eating better and appetite seems increased. She is still supplementing with Ensure.   Reviewed with patient and [name] that they can reach out to me with any questions or concerns. They understand and know how to reach me via My Chart or phone.

## 2019-05-23 NOTE — Progress Notes (Signed)
[place] CSW Progress Notes  Follow up phone call w husband, [name].  Are in process of applying for disability w [employer] [facility] [employer].  Have filed for early retirement while disability claim is in review. Parents are now moved out and living w other family members. Some stress has been relieved at this point.  Working through process of disability applications.  [name], LCSW Clinical Social Worker Phone:  [phone] Cell:  [phone]

## 2019-05-24 LAB — IRON AND TIBC
Iron: 31 ug/dL — ABNORMAL LOW (ref 41–142)
Saturation Ratios: 20 % — ABNORMAL LOW (ref 21–57)
TIBC: 155 ug/dL — ABNORMAL LOW (ref 236–444)
UIBC: 124 ug/dL (ref 120–384)

## 2019-05-24 LAB — FERRITIN: Ferritin: 1089 ng/mL — ABNORMAL HIGH (ref 11–307)

## 2019-05-24 LAB — TSH: TSH: 1.146 u[IU]/mL (ref 0.308–3.960)

## 2019-05-27 LAB — T4: T4, Total: 9 ug/dL (ref 4.5–12.0)

## 2019-05-28 ENCOUNTER — Telehealth: Payer: Self-pay | Admitting: Radiation Therapy

## 2019-05-28 NOTE — Telephone Encounter (Signed)
[name] left a message requesting a call back. [name] PA-C and I called him back and spoke with him together. [name] shared that the radiation treatments his wife received during her hospital stay had given her some pain relief. They are both very happy about this result and have questions about the possibility of additional treatment and it's benefits/side effects. Ashlyn shared that Dr. Tammi Klippel agrees that more radiation may provide additional relief and more durable pain control. [name] wants to discuss this with his wife to see if she is interested in more radiation. If she wants to proceed, I will schedule them a follow-up new visit with Dr. Tammi Klippel and Ailene Ards to discuss a plan of action.   [name] R.T.(R)(T) Radiation Special Procedures Navigator

## 2019-05-29 ENCOUNTER — Ambulatory Visit: Payer: BC Managed Care – PPO

## 2019-05-29 ENCOUNTER — Inpatient Hospital Stay: Payer: BC Managed Care – PPO | Admitting: Hematology & Oncology

## 2019-05-29 ENCOUNTER — Other Ambulatory Visit: Payer: BC Managed Care – PPO

## 2019-05-29 ENCOUNTER — Inpatient Hospital Stay: Payer: BC Managed Care – PPO

## 2019-05-30 ENCOUNTER — Encounter: Payer: BC Managed Care – PPO | Admitting: Physical Medicine and Rehabilitation

## 2019-05-30 ENCOUNTER — Encounter: Payer: Self-pay | Admitting: Hematology & Oncology

## 2019-05-31 ENCOUNTER — Other Ambulatory Visit: Payer: Self-pay | Admitting: *Deleted

## 2019-05-31 DIAGNOSIS — C3490 Malignant neoplasm of unspecified part of unspecified bronchus or lung: Secondary | ICD-10-CM

## 2019-05-31 DIAGNOSIS — C7931 Secondary malignant neoplasm of brain: Secondary | ICD-10-CM

## 2019-05-31 DIAGNOSIS — C3491 Malignant neoplasm of unspecified part of right bronchus or lung: Secondary | ICD-10-CM

## 2019-05-31 MED ORDER — FENTANYL 100 MCG/HR TD PT72: 1.0000 | MEDICATED_PATCH | TRANSDERMAL | 0 refills | Status: DC

## 2019-06-03 ENCOUNTER — Other Ambulatory Visit: Payer: Self-pay | Admitting: *Deleted

## 2019-06-03 ENCOUNTER — Other Ambulatory Visit: Payer: Self-pay

## 2019-06-03 ENCOUNTER — Encounter: Payer: Self-pay | Admitting: Hematology & Oncology

## 2019-06-03 ENCOUNTER — Ambulatory Visit
Admission: RE | Admit: 2019-06-03 | Discharge: 2019-06-03 | Disposition: A | Discharge status: Home / Self Care | Payer: BC Managed Care – PPO | Source: Ambulatory Visit | Attending: Radiation Oncology | Admitting: Radiation Oncology

## 2019-06-03 ENCOUNTER — Encounter: Payer: Self-pay | Admitting: Radiation Oncology

## 2019-06-03 ENCOUNTER — Ambulatory Visit
Admission: RE | Admit: 2019-06-03 | Payer: BC Managed Care – PPO | Source: Ambulatory Visit | Admitting: Radiation Oncology

## 2019-06-03 VITALS — BP 120/67 | HR 80 | Temp 98.7°F | Resp 18 | Ht 67.0 in | Wt 120.4 lb

## 2019-06-03 DIAGNOSIS — C7931 Secondary malignant neoplasm of brain: Secondary | ICD-10-CM | POA: Insufficient documentation

## 2019-06-03 DIAGNOSIS — Z85118 Personal history of other malignant neoplasm of bronchus and lung: Secondary | ICD-10-CM | POA: Diagnosis not present

## 2019-06-03 DIAGNOSIS — C3490 Malignant neoplasm of unspecified part of unspecified bronchus or lung: Secondary | ICD-10-CM

## 2019-06-03 DIAGNOSIS — M1990 Unspecified osteoarthritis, unspecified site: Secondary | ICD-10-CM | POA: Insufficient documentation

## 2019-06-03 DIAGNOSIS — G9389 Other specified disorders of brain: Secondary | ICD-10-CM | POA: Insufficient documentation

## 2019-06-03 DIAGNOSIS — I10 Essential (primary) hypertension: Secondary | ICD-10-CM | POA: Insufficient documentation

## 2019-06-03 DIAGNOSIS — F17210 Nicotine dependence, cigarettes, uncomplicated: Secondary | ICD-10-CM | POA: Diagnosis not present

## 2019-06-03 DIAGNOSIS — I700 Atherosclerosis of aorta: Secondary | ICD-10-CM | POA: Diagnosis not present

## 2019-06-03 DIAGNOSIS — C7951 Secondary malignant neoplasm of bone: Secondary | ICD-10-CM | POA: Diagnosis not present

## 2019-06-03 DIAGNOSIS — Z923 Personal history of irradiation: Secondary | ICD-10-CM | POA: Diagnosis not present

## 2019-06-03 DIAGNOSIS — Z9221 Personal history of antineoplastic chemotherapy: Secondary | ICD-10-CM | POA: Diagnosis not present

## 2019-06-03 DIAGNOSIS — C3491 Malignant neoplasm of unspecified part of right bronchus or lung: Secondary | ICD-10-CM | POA: Insufficient documentation

## 2019-06-03 DIAGNOSIS — G893 Neoplasm related pain (acute) (chronic): Secondary | ICD-10-CM | POA: Diagnosis not present

## 2019-06-03 DIAGNOSIS — C7952 Secondary malignant neoplasm of bone marrow: Secondary | ICD-10-CM

## 2019-06-03 DIAGNOSIS — C342 Malignant neoplasm of middle lobe, bronchus or lung: Secondary | ICD-10-CM | POA: Diagnosis not present

## 2019-06-03 DIAGNOSIS — J90 Pleural effusion, not elsewhere classified: Secondary | ICD-10-CM | POA: Diagnosis not present

## 2019-06-03 HISTORY — DX: Malignant neoplasm of unspecified part of unspecified bronchus or lung: C34.90

## 2019-06-03 MED ORDER — DRONABINOL 5 MG PO CAPS: 5.0000 mg | ORAL_CAPSULE | Freq: Two times a day (BID) | ORAL | 0 refills | Status: DC

## 2019-06-03 NOTE — Progress Notes (Signed)
See progress note under physician encounter. 

## 2019-06-03 NOTE — Progress Notes (Signed)
Histology and Location of Primary Cancer: Stage IV adenocarcinoma of lung with mets to brain and bone   Location(s) of Symptomatic tumor(s): T5 and left sixth rib  Past/Anticipated chemotherapy by medical oncology, if any:   Current Therapy:        Carboplatinum/Alimta/pembrolizumab-cycle #1-to start on 05/09/2019. Cycle 2 scheduled plus Neulasta on 06/06/2019. Husband verbalizes intent to push out chemotherapy one more week and confirms he has messaged Dr. Marin Olp with their intentions. Xgeva 120 mg IM q 3 month -- next dose 07/2019  Patient's main complaints related to symptomatic tumor(s) are: Thoracic spine pain and right proximal humerus pain 6 on a scale of 0-10. Reports right humerus pain keep her awake last night. She explains the pain in her humerus/right shoulder area is throbbing, tense and tight.  Managing pain with Fentanyl 100 patch, robaxin and morphine 1 tablet every six hours. Reports appetite improved with Marinol. Reports phenergan helps tremendously with nausea and vomiting.  If Spine Met(s), symptoms, if any, include:  Bowel/Bladder retention or incontinence (please describe): denies  Numbness or weakness in extremities (please describe): Yes, extremity weakness. Fortunately, has regain some mobility in her right arm  Current Decadron regimen, if applicable: No  Ambulatory status? Walker? Wheelchair?: ambulatory  SAFETY ISSUES:  Prior radiation? Yes Radiation treatment dates:   05/14/19 and 05/16/19 Site/dose:    1.  The Left Scapular lesion and T5 transverse process were treated to 8 Gy in one fraction  2.  The left anterior 6th rib metastasis was treated to 8 Gy in one fraction    Pacemaker/ICD? denies  Possible current pregnancy? no  Is the patient on methotrexate? denies  Additional Complaints / other details:  64 year old female. Married to [employer]. Has two dogs.

## 2019-06-03 NOTE — Progress Notes (Addendum)
Radiation Oncology         [phone] ________________________________  Outpatient Re-Evaluation  Name: [name] MRN: [id number]  Date of Service: 06/03/2019 DOB: 27-Feb-1956  CC:[name], MD  [name], MD   REFERRING PHYSICIAN: [name], MD  DIAGNOSIS: The primary encounter diagnosis was Lung cancer metastatic to brain [facility]). Diagnoses of Adenocarcinoma of lung, stage 4, right ([place]) and Bone metastases ([place]) were also pertinent to this visit.    ICD-10-CM   1. Lung cancer metastatic to brain ([facility])  C34.90    C79.31   2. Adenocarcinoma of lung, stage 4, right ([facility])  C34.91   3. Bone metastases ([facility])  C79.51     HISTORY OF PRESENT ILLNESS: [name] is a 64 y.o. female with a fairly recently diagnosed Stage IV, NSCLC. She initially presented with progressive right sided weakness in the upper and lower extremities and was found to have a posterior frontal brain metastasis as well as this her systemic work up revealed medial right upper lobe, compatible with primary bronchogenic neoplasm.  Additionally, there were multiple bilateral pulmonary nodules and multiple bone lesions. She underwent craniotomy and resection of the posterior frontal brain tumor for both therapeutic and diagnostic purposes with Dr. Annette Stable on 04/05/2019, and pathology confirmed adenocarcinoma consistent with metastatic lung adenocarcinoma. No residual enhancement was seen on postop MRI. Her pain delayed her postop radiotherapy to the brain. She proceeded with chemotherapy though this led to pancytopenia and this led to a hospitalization. While rehabilitating she continued to have significant bone pain in the left scapula and left anterior 6th rib. She was going to initially receive 10 fractions for her bone pain, but ultimately her treatment was converted to a hypofractionated course. She is now feeling better and is seen today to discuss proceeding with more palliative radiotherapy. Of note her last PET  imaging was on 05/06/19 and revealed multiple sites of disease in the bony skeleton including the right pelvis, lumbar and thoracic spine, as well as nodes in the chest and supraclavicular region. She also had faint hypermetabolism along the right glenoid region and multiple suspicious lesions in the ribs. She is planning systemic therapy with Dr. Marin Olp next week.   PREVIOUS RADIATION THERAPY:   05/14/19 and 05/16/19:  1.  The Left Scapular lesion and T5 transverse process were treated to 8 Gy in one fraction  2.  The left anterior 6th rib metastasis was treated to 8 Gy in one fraction   05/02/19  1.  The left scapula was treated to 3 Gy in a single fraction of a planned 10 fractions 2.  The left ribs were treated to 3 Gy in a single fraction of a planned 10 fractions  PAST MEDICAL HISTORY:  Past Medical History:  Diagnosis Date  . Bronchitis   . Goals of care, counseling/discussion 04/24/2019  . Hypertension   . Lung cancer ([place])    with painful bone mets  . Osteoarthritis of metacarpophalangeal (MCP) joint of left thumb   . Pelvic ring fracture ([facility]) 09/2016   left      PAST SURGICAL HISTORY: Past Surgical History:  Procedure Laterality Date  . APPLICATION OF CRANIAL NAVIGATION Left 04/05/2019   Procedure: APPLICATION OF CRANIAL NAVIGATION;  Surgeon: Earnie Larsson, MD;  Location: [place];  Service: Neurosurgery;  Laterality: Left;  . CRANIOTOMY Left 04/05/2019   Procedure: LEFT FRONTAL CRANIOTOMY TUMOR EXCISION w/Brain Lab;  Surgeon: Earnie Larsson, MD;  Location: [place];  Service: Neurosurgery;  Laterality: Left;  LEFT  FRONTAL CRANIOTOMY TUMOR EXCISION w/Brain Lab  . IR IMAGING GUIDED PORT INSERTION  05/08/2019    FAMILY HISTORY:  Family History  Problem Relation Age of Onset  . CAD Father        CABG  . Stroke Father   . Lymphoma Brother   . Breast cancer Neg Hx   . Pancreatic cancer Neg Hx   . Prostate cancer Neg Hx   . Colon cancer Neg Hx     SOCIAL HISTORY:  Social History    Socioeconomic History  . Marital status: Married    Spouse name: [name]  . Number of children: Not on file  . Years of education: Not on file  . Highest education level: Not on file  Occupational History  . Not on file  Tobacco Use  . Smoking status: Current Every Day Smoker    Packs/day: 0.50    Years: 25.00    Pack years: 12.50    Types: Cigarettes  . Smokeless tobacco: Never Used  Substance and Sexual Activity  . Alcohol use: No    Alcohol/week: 0.0 standard drinks  . Drug use: No  . Sexual activity: Not Currently    Birth control/protection: Post-menopausal  Other Topics Concern  . Not on file  Social History Narrative  . Not on file   Social Determinants of Health   Financial Resource Strain:   . Difficulty of Paying Living Expenses: Not on file  Food Insecurity:   . Worried About Charity fundraiser in the Last Year: Not on file  . Ran Out of Food in the Last Year: Not on file  Transportation Needs:   . Lack of Transportation (Medical): Not on file  . Lack of Transportation (Non-Medical): Not on file  Physical Activity:   . Days of Exercise per Week: Not on file  . Minutes of Exercise per Session: Not on file  Stress:   . Feeling of Stress : Not on file  Social Connections:   . Frequency of Communication with Friends and Family: Not on file  . Frequency of Social Gatherings with Friends and Family: Not on file  . Attends Religious Services: Not on file  . Active Member of Clubs or Organizations: Not on file  . Attends Archivist Meetings: Not on file  . Marital Status: Not on file  Intimate Partner Violence:   . Fear of Current or Ex-Partner: Not on file  . Emotionally Abused: Not on file  . Physically Abused: Not on file  . Sexually Abused: Not on file  The patient is married and lives in [place].  ALLERGIES: Patient has no known allergies.  MEDICATIONS:  Current Outpatient Medications  Medication Sig Dispense Refill  . acetaminophen  (TYLENOL) 325 MG tablet Take 2 tablets (650 mg total) by mouth every 6 (six) hours.    Marland Kitchen amLODipine (NORVASC) 10 MG tablet TAKE 1 TABLET (10 MG TOTAL) BY MOUTH DAILY. NEED OFFICE VISIT (Patient not taking: Reported on 05/13/2019) 90 tablet 0  . dronabinol (MARINOL) 5 MG capsule Take 1 capsule (5 mg total) by mouth 2 (two) times daily before lunch and supper. 60 capsule 0  . enoxaparin (LOVENOX) 100 MG/ML injection Inject 0.9 mLs (90 mg total) into the skin daily. 270 mL 0  . feeding supplement, ENSURE ENLIVE, (ENSURE ENLIVE) LIQD Take 237 mLs by mouth 2 (two) times daily between meals. 237 mL 12  . fentaNYL (DURAGESIC) 100 MCG/HR Place 1 patch onto the skin every 3 (three) days.  5 patch 0  . fentaNYL (DURAGESIC) 50 MCG/HR     . folic acid (FOLVITE) 1 MG tablet Take 1 tablet (1 mg total) by mouth daily. Start 5-7 days before Alimta chemotherapy. Continue until 21 days after Alimta completed. 100 tablet 3  . ketorolac (TORADOL) 10 MG tablet Take 1 tablet (10 mg total) by mouth every 6 (six) hours as needed. (Patient taking differently: Take 10 mg by mouth every 6 (six) hours as needed for moderate pain. ) 30 tablet 2  . lactulose (CHRONULAC) 10 GM/15ML solution Take 30 mLs (20 g total) by mouth 2 (two) times daily. (Patient taking differently: Take 20 g by mouth 2 (two) times daily as needed for mild constipation. ) 1000 mL 4  . levETIRAcetam (KEPPRA) 500 MG tablet Take 1 tablet (500 mg total) by mouth 2 (two) times daily. 60 tablet 1  . lidocaine-prilocaine (EMLA) cream Apply to affected area once (Patient taking differently: Apply 1 application topically as needed (port access). Apply to affected area once) 30 g 3  . lip balm (CARMEX) ointment Apply topically as needed for lip care. 7 g 0  . loratadine (CLARITIN) 10 MG tablet Take 1 tablet (10 mg total) by mouth daily. 30 tablet 0  . LORazepam (ATIVAN) 1 MG tablet Take 1 tablet (1 mg total) by mouth every 6 (six) hours as needed for anxiety. Or nausea  or vomiting. 30 tablet 1  . methocarbamol (ROBAXIN) 500 MG tablet Take 1 tablet (500 mg total) by mouth every 6 (six) hours as needed for muscle spasms. 60 tablet 3  . morphine (MSIR) 15 MG tablet Take 1-2 tablets (15-30 mg total) by mouth every 2 (two) hours as needed for severe pain. 90 tablet 0  . Multiple Vitamin (MULTIVITAMIN WITH MINERALS) TABS tablet Take 1 tablet by mouth daily. 30 tablet 0  . NARCAN 4 MG/0.1ML LIQD nasal spray kit Place 1 spray into the nose once as needed (overdose).     . ondansetron (ZOFRAN) 8 MG tablet Take 1 tablet (8 mg total) by mouth 2 (two) times daily as needed (Nausea or vomiting). Start if needed on the third day after chemotherapy. 30 tablet 1  . oxymetazoline (AFRIN) 0.05 % nasal spray Place 1 spray into both nostrils 2 (two) times daily as needed for congestion.    . pantoprazole (PROTONIX) 40 MG tablet Take 1 tablet (40 mg total) by mouth daily with supper. 30 tablet 1  . polyvinyl alcohol (LIQUIFILM TEARS) 1.4 % ophthalmic solution Place 1 drop into both eyes as needed for dry eyes.    . promethazine (PHENERGAN) 12.5 MG tablet TAKE 1-2 TABLETS BY MOUTH EVERY 4 (FOUR) HOURS AS NEEDED FOR REFRACTORY NAUSEA / VOMITING. 30 tablet 3  . senna-docusate (SENOKOT-S) 8.6-50 MG tablet Take 1 tablet by mouth 2 (two) times daily.     No current facility-administered medications for this encounter.    REVIEW OF SYSTEMS:  On review of systems, the patient reports that she is doing better than when she was last seen in our clinic. Her brain tumor was originally in the motor strip and she seems to be gaining improvement in function since surgery. She continues however to have pain in several of the areas of her bony metastases. Her main complaints of pain include her T spine along the 5th level with severe pain, as well as the right shoulder between the elbow and the right glenoid region. She describes this as a throbbing sensation, and continues taking Morphine 15  mg q 3  hours prn, and also is utilizing her Duragesic patch which is 100 mg. She denies any sternal chest pain, shortness of breath, cough, fevers, chills, night sweats, unintended weight changes. She denies any bowel or bladder disturbances, and denies abdominal pain. Her nausea is much better controlled with merinol. She denies any new musculoskeletal or joint aches or pains, new skin lesions or concerns. A complete review of systems is obtained and is otherwise negative.     PHYSICAL EXAM:  Wt Readings from Last 3 Encounters:  06/03/19 120 lb 6.4 oz (54.6 kg)  05/23/19 120 lb 12 oz (54.8 kg)  05/13/19 134 lb (60.8 kg)   Temp Readings from Last 3 Encounters:  06/03/19 98.7 F (37.1 C)  05/23/19 97.6 F (36.4 C) (Temporal)  05/17/19 97.8 F (36.6 C) (Oral)   BP Readings from Last 3 Encounters:  06/03/19 120/67  05/23/19 105/76  05/17/19 139/81   Pulse Readings from Last 3 Encounters:  06/03/19 80  05/23/19 77  05/17/19 88   In general this is a thin, chronically ill caucasian female in no acute distress. She's alert and oriented x4 and appropriate throughout the examination. Cardiopulmonary assessment is negative for acute distress and she exhibits normal effort.     KPS = 80  100 - Normal; no complaints; no evidence of disease. 90   - Able to carry on normal activity; minor signs or symptoms of disease. 80   - Normal activity with effort; some signs or symptoms of disease. 48   - Cares for self; unable to carry on normal activity or to do active work. 60   - Requires occasional assistance, but is able to care for most of his personal needs. 50   - Requires considerable assistance and frequent medical care. 55   - Disabled; requires special care and assistance. 57   - Severely disabled; hospital admission is indicated although death not imminent. 76   - Very sick; hospital admission necessary; active supportive treatment necessary. 10   - Moribund; fatal processes progressing  rapidly. 0     - Dead  Karnofsky DA, Abelmann [place], Craver LS and Burchenal [facility] [phone]) The use of the nitrogen mustards in the palliative treatment of carcinoma: with particular reference to bronchogenic carcinoma Cancer 1 634-56  LABORATORY DATA:  Lab Results  Component Value Date   WBC 5.4 05/23/2019   HGB 11.2 (L) 05/23/2019   HCT 34.4 (L) 05/23/2019   MCV 94.5 05/23/2019   PLT 463 (H) 05/23/2019   Lab Results  Component Value Date   NA 138 05/23/2019   K 3.5 05/23/2019   CL 99 05/23/2019   CO2 29 05/23/2019   Lab Results  Component Value Date   ALT 14 05/23/2019   AST 23 05/23/2019   ALKPHOS 144 (H) 05/23/2019   BILITOT 0.3 05/23/2019     RADIOGRAPHY: CT HEAD WO CONTRAST  Result Date: 05/14/2019 CLINICAL DATA:  [place] with trauma to the face EXAM: CT HEAD WITHOUT CONTRAST TECHNIQUE: Contiguous axial images were obtained from the base of the skull through the vertex without intravenous contrast. COMPARISON:  04/06/2019 FINDINGS: Brain: No acute or traumatic finding. Chronic small-vessel ischemic changes affect the pons. No focal cerebellar finding. Cerebral hemispheres show old small vessel infarctions of the thalami, basal ganglia left more than right and hemispheric deep white matter. Previous left frontal vertex craniotomy for tumor resection. No sign of increasing mass, edema or hemorrhage in that region. No hydrocephalus or extra-axial collection.  Vascular: There is atherosclerotic calcification of the major vessels at the base of the brain. Skull: No acute or traumatic finding. Sinuses/Orbits: Clear/normal Other: None IMPRESSION: No acute or traumatic finding. Chronic small-vessel ischemic changes throughout the brain as noted above. Previous left frontal vertex craniotomy for tumor resection. No unexpected finding in that location by CT. Electronically Signed   By: Nelson Chimes M.D.   On: 05/14/2019 12:23   CT Angio Chest PE W and/or Wo Contrast  Result Date:  05/13/2019 CLINICAL DATA:  Shortness of breath. Recent diagnosis of metastatic non-small cell cancer. EXAM: CT ANGIOGRAPHY CHEST WITH CONTRAST TECHNIQUE: Multidetector CT imaging of the chest was performed using the standard protocol during bolus administration of intravenous contrast. Multiplanar CT image reconstructions and MIPs were obtained to evaluate the vascular anatomy. CONTRAST:  143m OMNIPAQUE IOHEXOL 350 MG/ML SOLN COMPARISON:  PET CT 7 days ago FINDINGS: Cardiovascular: Positive for acute pulmonary embolus with filling defect in the lingular and left lower lobar pulmonary arteries. Thromboembolic burden is small. Aortic atherosclerosis without dissection. Heart size normal. Minimal pericardial fluid. Right chest port in place, tip in the atrial caval junction. Mediastinum/Nodes: Multifocal bulky mediastinal and right hilar adenopathy, most prominent involving the right lower paratracheal region. No a conglomerate measures up to 3.6 cm, not significantly changed from prior PET allowing for differences in technique. Right supraclavicular adenopathy with node measuring 2.1 cm. There also enlarged left supraclavicular nodes. No esophageal wall thickening. Lungs/Pleura: Small bilateral pleural effusions have improved from prior PET. Right pleural effusion extends into the fissure. Multiple pulmonary nodules throughout both lungs. Dominant lesion in the right upper lobe extension the hilum towards the apex with surrounding spiculations. Some of these nodules are more conspicuous given decrease in pleural fluid from prior. Otherwise no significant change in nodule since prior PET. Trachea and mainstem bronchi are patent. Upper Abdomen: Assessed on concurrent abdominal CT, reported separately. Musculoskeletal: Osseous metastatic disease, assessed on recent PET. This includes destructive lesion involving left anterior sixth rib. Many of the region of hypermetabolism on PET have faint or no CT correlate. Review  of the MIP images confirms the above findings. IMPRESSION: 1. Positive for acute pulmonary embolus in the lingular and left lower lobar pulmonary arteries. Thromboembolic burden is small. 2. Small bilateral pleural effusions, improved since PET CT 1 week ago. 3. Recent diagnosis of non-small cell lung cancer with recent staging PET scan 1 week ago. Multiple pulmonary nodules, multifocal thoracic adenopathy, and osseous metastatic disease. Overall no significant change. 4.  Aortic Atherosclerosis (ICD10-I70.0). Critical Value/emergent results were called by telephone at the time of interpretation on 05/13/2019 at 6:51 am to [facility], who verbally acknowledged these results. Electronically Signed   By: MKeith RakeM.D.   On: 05/13/2019 06:51   CT ABDOMEN PELVIS W CONTRAST  Result Date: 05/13/2019 CLINICAL DATA:  Abdominal pain. Urinary frequency. Right-sided pain. EXAM: CT ABDOMEN AND PELVIS WITH CONTRAST TECHNIQUE: Multidetector CT imaging of the abdomen and pelvis was performed using the standard protocol following bolus administration of intravenous contrast. CONTRAST:  1056mOMNIPAQUE IOHEXOL 350 MG/ML SOLN COMPARISON:  Recent PET CT 05/06/2019, abdominal CT 04/02/2019 FINDINGS: Lower chest: Assessed on concurrent chest CT. Small pleural effusions. Hepatobiliary: Small low-density lesions in the liver consistent with cyst. There is no additional vague hypodensity in the right lobe measuring 16 mm, previously with lobulated enhancement, likely hemangioma. No new liver lesion. Gallbladder physiologically distended, no calcified stone. No biliary dilatation. Pancreas: No ductal dilatation or inflammation. Spleen:  Calcified granuloma. Normal in size. Adrenals/Urinary Tract: Mild left adrenal thickening again seen. Normal right adrenal gland. Heterogeneous enhancement of the right kidney with patchy nephrogram suspicious for pyelonephritis. No hydronephrosis. No renal fluid collection. Small low-density  lesion in the upper left kidney is similar to prior exam. Mild urinary bladder wall thickening. No urolithiasis. Stomach/Bowel: Stomach partially distended. No gastric wall thickening. No bowel obstruction or inflammation. Appendix not confidently visualized, no evidence of appendicitis. Vascular/Lymphatic: Aortic atherosclerosis. No aneurysm. 10 mm left periaortic node unchanged from recent PET, series 4, image 37. No new adenopathy. Reproductive: Small uterine fibroid. No adnexal mass. Other: No ascites. No free air. Musculoskeletal: Destructive lytic lesion within L3 vertebral body, unchanged from recent PET. No new osseous abnormality. IMPRESSION: 1. Findings suspicious for cystitis and right pyelonephritis. 2. Destructive lytic lesion within L3 vertebral body, unchanged from recent PET. Electronically Signed   By: Keith Rake M.D.   On: 05/13/2019 07:00   NM PET Image Initial (PI) Skull Base To Thigh  Result Date: 05/06/2019 CLINICAL DATA:  Initial treatment strategy for metastatic non-small cell lung cancer. EXAM: NUCLEAR MEDICINE PET SKULL BASE TO THIGH TECHNIQUE: 6.6 mCi F-18 FDG was injected intravenously. Full-ring PET imaging was performed from the skull base to thigh after the radiotracer. CT data was obtained and used for attenuation correction and anatomic localization. Fasting blood glucose: 94 mg/dl COMPARISON:  Multiple exams, including CT scans from 04/02/2019 FINDINGS: Mediastinal blood pool activity: SUV max 1.8 Liver activity: SUV max NA NECK: Symmetric glottic activity, maximum SUV 8.1, probably physiologic. Incidental CT findings: Bilateral common carotid atherosclerotic calcification. CHEST: Hypermetabolic bilateral supraclavicular, right paratracheal, left internal mammary, prevascular, subcarinal, right hilar, and right infrahilar adenopathy. A right supraclavicular node measuring 2.2 cm in short axis on image 36/4 (formerly 1.1 cm on 04/02/2027) has a maximum SUV of 19.5. A  centrally necrotic right paratracheal lymph node measuring 3.8 cm in short axis on image 49/4 (formerly 0.0 cm) has a maximum SUV 22.5. Bilateral hypermetabolic pulmonary nodules. An index superior segment right lower lobe nodule measures 2.2 by 1.8 cm on image 33/8 (formerly 1.8 by 1.5 cm on 04/02/2019) with maximum SUV 10.4. A left upper lobe nodule measuring 2.0 by 1.6 cm on image 26/8 (formerly 1.7 by 1.5 cm) has a maximum SUV of 15.9. Incidental CT findings: New small to moderate bilateral pleural effusions. These are nonspecific for malignant involvement. Coronary, aortic arch, and branch vessel atherosclerotic vascular disease. ABDOMEN/PELVIS: A left periaortic node measuring 1.0 cm in short axis on image 113/4 (previously 0.3 cm) has a maximum SUV of 18.6. Scattered activity in bowel, most likely to be physiologic. Subtle activity along the right hepatic lobe capsular margin adjacent to the adrenal gland with maximum SUV of 4.7 compared to background liver activity of 2.9, a small metastatic lesion the liver or conceivably of the for renal gland is raises possibility although no contour abnormality of the right adrenal gland is observed. Incidental CT findings: Aortoiliac atherosclerotic vascular disease. Hypodense lesion anteriorly in the liver for example on image 36/1, not metabolically active, probably benign. SKELETON: Scattered hypermetabolic skeletal lesions in the cervical spine, thoracic spine, ribs, left scapula, lumbar spine, right iliac bone noted. Index destructive lesion of the left anterior sixth rib associated with surrounding soft tissue mass measuring 3.9 cm in short axis (previously 2.9 cm), maximum SUV 21.9. Index metastatic lesion in the L3 vertebral body is primarily lytic with maximum SUV 18.6. Index metastatic lesion in the right upper acetabulum/iliac bone  is primarily mildly sclerotic with maximum SUV 8.7. Overall the appearance of degree of involvement seems increased compared to  the bone scan of 04/09/2019. Incidental CT findings: Old healed left pelvic fractures. IMPRESSION: 1. Extensive and enlarging metastatic adenopathy in the chest with numerous enlarging metastatic nodules in the lungs. Scattered increased osseous metastatic lesions. New hypermetabolic left periaortic lymph node in the abdomen. 2. New small to moderate bilateral pleural effusions. 3. Aortic Atherosclerosis (ICD10-I70.0).  Coronary atherosclerosis. Electronically Signed   By: Van Clines M.D.   On: 05/06/2019 11:55   DG Chest Port 1 View  Result Date: 05/13/2019 CLINICAL DATA:  Chest pain. Recent diagnosis of lung cancer. EXAM: PORTABLE CHEST 1 VIEW COMPARISON:  Radiograph 04/01/2019. PET CT 05/06/2019 reviewed FINDINGS: Patient is rotated. Right chest port with tip in the lower SVC. No pneumothorax. Right pleural effusion. Left pleural effusion on PET CT not well seen. Right paratracheal soft tissue density, likely related to underlying pulmonary mass. Patchy opacity in the left perihilar lung. No pneumothorax. Bone lesions on prior PET are not well demonstrated radiographically. IMPRESSION: 1. Right pleural effusion. Left pleural effusion on recent PET not visualized radiographically. Right paratracheal soft tissue density likely related to known pulmonary mass. 2. Patchy opacities in the left perihilar lung likely correspond pulmonary nodules on PET. 3. Right chest port in place, tip in the SVC. Electronically Signed   By: Keith Rake M.D.   On: 05/13/2019 05:01   IR IMAGING GUIDED PORT INSERTION  Result Date: 05/08/2019 INDICATION: 64 year old female with stage IV metastatic lung cancer including brain metastases. She presents for port catheter placement. EXAM: IMPLANTED PORT A CATH PLACEMENT WITH ULTRASOUND AND FLUOROSCOPIC GUIDANCE MEDICATIONS: 2 g Ancef and 25 mg Benadryl; The antibiotic was administered within an appropriate time interval prior to skin puncture. ANESTHESIA/SEDATION: Versed 2  mg IV; Fentanyl 100 mcg IV; Moderate Sedation Time:  18 minutes The patient was continuously monitored during the procedure by the interventional radiology nurse under my direct supervision. FLUOROSCOPY TIME:  0 minutes, 12 seconds (2 mGy) COMPLICATIONS: None immediate. PROCEDURE: The right neck and chest was prepped with chlorhexidine, and draped in the usual sterile fashion using maximum barrier technique (cap and mask, sterile gown, sterile gloves, large sterile sheet, hand hygiene and cutaneous antiseptic). Local anesthesia was attained by infiltration with 1% lidocaine with epinephrine. Ultrasound demonstrated patency of the right internal jugular vein, and this was documented with an image. Under real-time ultrasound guidance, this vein was accessed with a 21 gauge micropuncture needle and image documentation was performed. A small dermatotomy was made at the access site with an 11 scalpel. A 0.018" wire was advanced into the SVC and the access needle exchanged for a 33F micropuncture vascular sheath. The 0.018" wire was then removed and a 0.035" wire advanced into the IVC. An appropriate location for the subcutaneous reservoir was selected below the clavicle and an incision was made through the skin and underlying soft tissues. The subcutaneous tissues were then dissected using a combination of blunt and sharp surgical technique and a pocket was formed. A single lumen low-profile power injectable portacatheter was then tunneled through the subcutaneous tissues from the pocket to the dermatotomy and the port reservoir placed within the subcutaneous pocket. The venous access site was then serially dilated and a peel away vascular sheath placed over the wire. The wire was removed and the port catheter advanced into position under fluoroscopic guidance. The catheter tip is positioned in the superior cavoatrial junction. This was documented  with a spot image. The portacatheter was then tested and found to flush and  aspirate well. The port was flushed with saline followed by 100 units/mL heparinized saline. The pocket was then closed in two layers using first subdermal inverted interrupted absorbable sutures followed by a running subcuticular suture. The epidermis was then sealed with Dermabond. The dermatotomy at the venous access site was also closed with Dermabond. IMPRESSION: Successful placement of a right IJ approach Power Port with ultrasound and fluoroscopic guidance. The catheter is ready for use. Electronically Signed   By: Jacqulynn Cadet M.D.   On: 05/08/2019 15:58   ECHOCARDIOGRAM LIMITED  Result Date: 05/13/2019   ECHOCARDIOGRAM LIMITED REPORT   Patient Name:   MARKELA WEE Date of Exam: 05/13/2019 Medical Rec #:  [id number]     Height:       67.0 in Accession #:    [id number]    Weight:       134.0 lb Date of Birth:  23-Dec-1955      BSA:          1.71 m Patient Age:    64 years      BP:           123/104 mmHg Patient Gender: F             HR:           103 bpm. Exam Location:  Inpatient  Procedure: Limited Echo Indications:    I26.02 Pulmonary embolus  History:        Patient has no prior history of Echocardiogram examinations.  Sonographer:    Tiffany Dance Referring Phys: [id number] RONDELL A SMITH IMPRESSIONS  1. Only 2 images available, extremely limited, patient refused further imaging. Normal LV size and thickness. Grossly normal LV function, EF looks like 60-65% from parasternal long axis. Very limited RV view appears to show normal systolic function. Small anterior pericardial effusion. FINDINGS  Left Ventricle: Only 2 images available. Normal LV size and thickness. Grossly normal LV function, EF looks like 60-65% from parasternal long axis. Very limited RV view appears to show normal systolic function. Small anterior pericardial effusion.  Loralie Champagne MD Electronically signed by Loralie Champagne MD Signature Date/Time: 05/13/2019/2:31:39 PM   Final       IMPRESSION/PLAN: 67. 64 y.o. 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 Dr. Tammi Klippel discusses the patient's recent course and reviews her prior treatment and imaging. He discusses that he would recommend treatment to the adenopathy in the chest and lower neck/supraclaviclar region. She appears radiographically at risk of SVC syndrome. While she does not have clinical exam findings of this, Dr. Tammi Klippel is worried that this could be impending and given her symptoms of pain and location of pain. We discussed the risks, benefits, short, and long term effects of radiotherapy, and the patient is interested in proceeding. Dr. Tammi Klippel discusses the delivery and logistics of radiotherapy and anticipates a course of 2 weeks of radiotherapy to the chest and supraclavicular/neck nodes. She is also planning to meet with Dr. Julien Nordmann later this week for a second opinion and would like to postpone final plans for radiotherapy until she meets with him. Written consent is obtained and placed in the chart, a copy will be provided to the patient, but they would like to move her sim appointment until Friday at 11 am following her visit with Dr. Julien Nordmann. She is counseled and given precautions of symptoms to call for sooner  regarding her SVC.  2. Pain from Bone Metastases. She will continue her current regimen of short and long acting narcotics. She will notify us if she has any progressive symptoms. She is also aware of the symptoms possibly being related to a site in the cervical spine on re-review of her imaging. Dr. Tammi Klippel favors our treatment plan above, but discusses that this could be a site we come back to at a later time.   This encounter was provided by telemedicine platform Webex.  The patient has given verbal consent for this type of encounter and has been advised to only accept a meeting of this type in a secure network environment. The time spent during this encounter was 60 minutes. The attendants for this meeting  include Joaquim Lai, RN, Dr. Tammi Klippel,  Hayden Pedro  and [name]. Mr. Dubow was also present during her visit. During the encounter, Hayden Pedro was located remotely from home. Dr. Tammi Klippel and Joaquim Lai, RN were located at [facility] Radiation Oncology Department.  [name] and her husband were located also at [facility] Radiation Oncology Department.     Carola Rhine, PAC     Tyler Pita, MD  [facility] Direct Dial: [phone]  Fax: [phone] [email or web address]  Skype  [email or web address]

## 2019-06-05 ENCOUNTER — Encounter: Payer: Self-pay | Admitting: Hematology & Oncology

## 2019-06-06 ENCOUNTER — Other Ambulatory Visit: Payer: BC Managed Care – PPO

## 2019-06-06 ENCOUNTER — Ambulatory Visit: Payer: BC Managed Care – PPO

## 2019-06-06 ENCOUNTER — Other Ambulatory Visit: Payer: Self-pay | Admitting: *Deleted

## 2019-06-06 ENCOUNTER — Ambulatory Visit: Payer: BC Managed Care – PPO | Admitting: Hematology & Oncology

## 2019-06-06 DIAGNOSIS — C3490 Malignant neoplasm of unspecified part of unspecified bronchus or lung: Secondary | ICD-10-CM

## 2019-06-06 DIAGNOSIS — M25551 Pain in right hip: Secondary | ICD-10-CM

## 2019-06-06 DIAGNOSIS — C3491 Malignant neoplasm of unspecified part of right bronchus or lung: Secondary | ICD-10-CM

## 2019-06-06 DIAGNOSIS — C7931 Secondary malignant neoplasm of brain: Secondary | ICD-10-CM

## 2019-06-06 DIAGNOSIS — C7951 Secondary malignant neoplasm of bone: Secondary | ICD-10-CM

## 2019-06-06 MED ORDER — LORAZEPAM 1 MG PO TABS: 1.0000 mg | ORAL_TABLET | Freq: Four times a day (QID) | ORAL | 1 refills | Status: DC | PRN

## 2019-06-06 MED ORDER — KETOROLAC TROMETHAMINE 10 MG PO TABS: 10.0000 mg | ORAL_TABLET | Freq: Four times a day (QID) | ORAL | 1 refills | Status: DC | PRN

## 2019-06-06 MED ORDER — MORPHINE SULFATE 15 MG PO TABS: 15.0000 mg | ORAL_TABLET | ORAL | 0 refills | Status: DC | PRN

## 2019-06-06 MED ORDER — PROMETHAZINE HCL 12.5 MG PO TABS: ORAL_TABLET | ORAL | 3 refills | Status: AC

## 2019-06-06 MED ORDER — PANTOPRAZOLE SODIUM 40 MG PO TBEC: 40.0000 mg | DELAYED_RELEASE_TABLET | Freq: Every day | ORAL | 2 refills | Status: DC

## 2019-06-06 NOTE — Telephone Encounter (Signed)
Message from patient

## 2019-06-07 ENCOUNTER — Encounter: Payer: Self-pay | Admitting: Hematology & Oncology

## 2019-06-07 ENCOUNTER — Other Ambulatory Visit: Payer: BC Managed Care – PPO

## 2019-06-07 ENCOUNTER — Inpatient Hospital Stay: Payer: BC Managed Care – PPO | Attending: Hematology & Oncology | Admitting: Internal Medicine

## 2019-06-07 ENCOUNTER — Encounter: Payer: Self-pay | Admitting: Internal Medicine

## 2019-06-07 ENCOUNTER — Encounter: Payer: Self-pay | Admitting: Radiation Oncology

## 2019-06-07 ENCOUNTER — Other Ambulatory Visit: Payer: Self-pay

## 2019-06-07 ENCOUNTER — Inpatient Hospital Stay: Payer: BC Managed Care – PPO

## 2019-06-07 ENCOUNTER — Ambulatory Visit
Admission: RE | Admit: 2019-06-07 | Discharge: 2019-06-07 | Disposition: A | Discharge status: Home / Self Care | Payer: BC Managed Care – PPO | Source: Ambulatory Visit | Attending: Radiation Oncology | Admitting: Radiation Oncology

## 2019-06-07 ENCOUNTER — Other Ambulatory Visit: Payer: Self-pay | Admitting: Medical Oncology

## 2019-06-07 VITALS — BP 126/81 | HR 88 | Temp 98.2°F | Resp 17 | Ht 67.0 in | Wt 120.2 lb

## 2019-06-07 DIAGNOSIS — C3491 Malignant neoplasm of unspecified part of right bronchus or lung: Secondary | ICD-10-CM

## 2019-06-07 DIAGNOSIS — C3490 Malignant neoplasm of unspecified part of unspecified bronchus or lung: Secondary | ICD-10-CM | POA: Diagnosis not present

## 2019-06-07 DIAGNOSIS — Z5112 Encounter for antineoplastic immunotherapy: Secondary | ICD-10-CM | POA: Diagnosis not present

## 2019-06-07 DIAGNOSIS — C3411 Malignant neoplasm of upper lobe, right bronchus or lung: Secondary | ICD-10-CM | POA: Diagnosis not present

## 2019-06-07 DIAGNOSIS — C7951 Secondary malignant neoplasm of bone: Secondary | ICD-10-CM | POA: Diagnosis not present

## 2019-06-07 DIAGNOSIS — R0609 Other forms of dyspnea: Secondary | ICD-10-CM | POA: Diagnosis not present

## 2019-06-07 DIAGNOSIS — Z86718 Personal history of other venous thrombosis and embolism: Secondary | ICD-10-CM | POA: Diagnosis not present

## 2019-06-07 DIAGNOSIS — Z7901 Long term (current) use of anticoagulants: Secondary | ICD-10-CM | POA: Insufficient documentation

## 2019-06-07 DIAGNOSIS — M1990 Unspecified osteoarthritis, unspecified site: Secondary | ICD-10-CM | POA: Insufficient documentation

## 2019-06-07 DIAGNOSIS — C7931 Secondary malignant neoplasm of brain: Secondary | ICD-10-CM

## 2019-06-07 DIAGNOSIS — Z86711 Personal history of pulmonary embolism: Secondary | ICD-10-CM | POA: Insufficient documentation

## 2019-06-07 DIAGNOSIS — M549 Dorsalgia, unspecified: Secondary | ICD-10-CM | POA: Insufficient documentation

## 2019-06-07 DIAGNOSIS — R091 Pleurisy: Secondary | ICD-10-CM | POA: Diagnosis not present

## 2019-06-07 DIAGNOSIS — Z791 Long term (current) use of non-steroidal anti-inflammatories (NSAID): Secondary | ICD-10-CM | POA: Diagnosis not present

## 2019-06-07 DIAGNOSIS — I10 Essential (primary) hypertension: Secondary | ICD-10-CM | POA: Insufficient documentation

## 2019-06-07 DIAGNOSIS — Z9221 Personal history of antineoplastic chemotherapy: Secondary | ICD-10-CM | POA: Diagnosis not present

## 2019-06-07 DIAGNOSIS — R531 Weakness: Secondary | ICD-10-CM | POA: Diagnosis not present

## 2019-06-07 DIAGNOSIS — Z79899 Other long term (current) drug therapy: Secondary | ICD-10-CM | POA: Insufficient documentation

## 2019-06-07 DIAGNOSIS — R110 Nausea: Secondary | ICD-10-CM | POA: Diagnosis not present

## 2019-06-07 DIAGNOSIS — G9389 Other specified disorders of brain: Secondary | ICD-10-CM | POA: Diagnosis not present

## 2019-06-07 NOTE — Addendum Note (Signed)
Addended by: Ardeen Garland on: 06/07/2019 12:10 PM   Modules accepted: Orders

## 2019-06-07 NOTE — Progress Notes (Signed)
[place] [phone]   [phone]  OFFICE PROGRESS NOTE  [name], MD [address]  [place] [address]  DIAGNOSIS: Stage IVB (T2b, N3, M1c) non-small cell lung cancer, adenocarcinoma presented with bilateral pulmonary nodules in addition to extensive metastatic mediastinal and supraclavicular adenopathy as well as metastatic disease to the brain and bone diagnosed in December 2020.  PRIOR THERAPY:  1) Status post left frontal craniotomy with resection of tumor under the care of Dr. Eddie Dibbles on April 05, 2019. 2) status post palliative radiotherapy to metastatic bone lesions in the left scapula and left ribs under the care of Dr. Tammi Klippel  CURRENT THERAPY: Systemic chemotherapy with carboplatin for AUC of 5, Alimta 500 mg/M2 and Keytruda 200 mg IV every 3 weeks.  Status post 1 cycle.  INTERVAL HISTORY: [name] 64 y.o. female presented to the clinic today for a second opinion regarding her condition.  The patient was diagnosed with non-small cell lung cancer, adenocarcinoma in early December 2020 after she presented to the emergency department on April 01, 2019 complaining of weakness in the right upper and lower extremities with altered handwriting.  Imaging studies at that time including CT scan of the head as well as MRI of the brain showed solitary metastatic lesion in the posterior left frontal lobe with moderate vasogenic edema.  The patient was started on Decadron and felt well.  She underwent craniotomy with resection of the brain tumor on April 05, 2019 and the final pathology was consistent with metastatic adenocarcinoma.  Molecular studies showed no actionable mutations.  The patient also had CT scan of the chest, abdomen pelvis during her hospitalization and it showed 3.6 cm spiculated mass in the medial right upper lobe compatible with the primary bronchogenic neoplasm in addition to bilateral pulmonary metastasis and  associated thoracic nodal metastasis and destructive lytic lesions along the anterior aspect of the L3 vertebral body.  A PET scan on May 06, 2019 showed extensive and enlarging metastatic adenopathy in the chest with numerous enlarging metastatic nodules in the lung and scattered increased osseous metastatic lesions with new hypermetabolic left periaortic lymph node in the abdomen and a new small to moderate bilateral pleural effusions.  The patient was also diagnosed during her hospitalization with deep venous thrombosis as well as pulmonary embolism and she is currently on Lovenox.  She was seen by Dr. Marin Olp and started systemic chemotherapy with carboplatin, Alimta and Keytruda.  She has a rough time with the first cycle of her treatment and was admitted to the hospital with urinary tract infection as well as neutropenia.  She is feeling much better but she still concerned about proceeding with further chemotherapy.  She came to the office today for evaluation and second opinion regarding her condition. When seen today she is feeling much better with no concerning complaints except for mild pain on the right side of the chest.  She denied having any current shortness of breath except with exertion with mild cough and no hemoptysis.  She has no current nausea, vomiting, diarrhea or constipation.  She denied having any headache or visual changes.  MEDICAL HISTORY: Past Medical History:  Diagnosis Date   Bronchitis    Goals of care, counseling/discussion 04/24/2019   Hypertension    Lung cancer ([facility])    with painful bone mets   Osteoarthritis of metacarpophalangeal (MCP) joint of left thumb    Pelvic ring fracture ([place]) 09/2016   left  ALLERGIES:  has No Known Allergies.  MEDICATIONS:  Current Outpatient Medications  Medication Sig Dispense Refill   acetaminophen (TYLENOL) 325 MG tablet Take 2 tablets (650 mg total) by mouth every 6 (six) hours.     amLODipine (NORVASC) 10 MG  tablet TAKE 1 TABLET (10 MG TOTAL) BY MOUTH DAILY. NEED OFFICE VISIT (Patient not taking: Reported on 05/13/2019) 90 tablet 0   dronabinol (MARINOL) 5 MG capsule Take 1 capsule (5 mg total) by mouth 2 (two) times daily before lunch and supper. 60 capsule 0   enoxaparin (LOVENOX) 100 MG/ML injection Inject 0.9 mLs (90 mg total) into the skin daily. 270 mL 0   feeding supplement, ENSURE ENLIVE, (ENSURE ENLIVE) LIQD Take 237 mLs by mouth 2 (two) times daily between meals. 237 mL 12   fentaNYL (DURAGESIC) 100 MCG/HR Place 1 patch onto the skin every 3 (three) days. 5 patch 0   folic acid (FOLVITE) 1 MG tablet Take 1 tablet (1 mg total) by mouth daily. Start 5-7 days before Alimta chemotherapy. Continue until 21 days after Alimta completed. 100 tablet 3   ketorolac (TORADOL) 10 MG tablet Take 1 tablet (10 mg total) by mouth every 6 (six) hours as needed. 30 tablet 1   lactulose (CHRONULAC) 10 GM/15ML solution Take 30 mLs (20 g total) by mouth 2 (two) times daily. (Patient not taking: Reported on 06/03/2019) 1000 mL 4   lidocaine-prilocaine (EMLA) cream Apply to affected area once (Patient not taking: Reported on 06/03/2019) 30 g 3   lip balm (CARMEX) ointment Apply topically as needed for lip care. 7 g 0   loratadine (CLARITIN) 10 MG tablet Take 1 tablet (10 mg total) by mouth daily. 30 tablet 0   LORazepam (ATIVAN) 1 MG tablet Take 1 tablet (1 mg total) by mouth every 6 (six) hours as needed for anxiety. 30 tablet 1   methocarbamol (ROBAXIN) 500 MG tablet Take 1 tablet (500 mg total) by mouth every 6 (six) hours as needed for muscle spasms. 60 tablet 3   morphine (MSIR) 15 MG tablet Take 1-2 tablets (15-30 mg total) by mouth every 2 (two) hours as needed for severe pain. 90 tablet 0   Multiple Vitamin (MULTIVITAMIN WITH MINERALS) TABS tablet Take 1 tablet by mouth daily. 30 tablet 0   NARCAN 4 MG/0.1ML LIQD nasal spray kit Place 1 spray into the nose once as needed (overdose).      ondansetron  (ZOFRAN) 8 MG tablet Take 1 tablet (8 mg total) by mouth 2 (two) times daily as needed (Nausea or vomiting). Start if needed on the third day after chemotherapy. 30 tablet 1   oxymetazoline (AFRIN) 0.05 % nasal spray Place 1 spray into both nostrils 2 (two) times daily as needed for congestion.     pantoprazole (PROTONIX) 40 MG tablet Take 1 tablet (40 mg total) by mouth daily with supper. 30 tablet 2   polyvinyl alcohol (LIQUIFILM TEARS) 1.4 % ophthalmic solution Place 1 drop into both eyes as needed for dry eyes.     promethazine (PHENERGAN) 12.5 MG tablet TAKE 1-2 TABLETS BY MOUTH EVERY 4 (FOUR) HOURS AS NEEDED FOR REFRACTORY NAUSEA / VOMITING. 30 tablet 3   senna-docusate (SENOKOT-S) 8.6-50 MG tablet Take 1 tablet by mouth 2 (two) times daily.     No current facility-administered medications for this visit.    SURGICAL HISTORY:  Past Surgical History:  Procedure Laterality Date   APPLICATION OF CRANIAL NAVIGATION Left 04/05/2019   Procedure: APPLICATION OF CRANIAL NAVIGATION;  Surgeon: Earnie Larsson, MD;  Location: [facility];  Service: Neurosurgery;  Laterality: Left;   CRANIOTOMY Left 04/05/2019   Procedure: LEFT FRONTAL CRANIOTOMY TUMOR EXCISION w/Brain Lab;  Surgeon: Earnie Larsson, MD;  Location: [facility];  Service: Neurosurgery;  Laterality: Left;  LEFT FRONTAL CRANIOTOMY TUMOR EXCISION w/Brain Lab   IR IMAGING GUIDED PORT INSERTION  05/08/2019    REVIEW OF SYSTEMS:  Constitutional: positive for anorexia, fatigue and weight loss Eyes: negative Ears, nose, mouth, throat, and face: negative Respiratory: positive for dyspnea on exertion and pleurisy/chest pain Cardiovascular: negative Gastrointestinal: negative Genitourinary:negative Integument/breast: negative Hematologic/lymphatic: negative Musculoskeletal:positive for bone pain Neurological: negative Behavioral/Psych: negative Endocrine: negative Allergic/Immunologic: negative   PHYSICAL EXAMINATION: General appearance: alert,  cooperative, fatigued and no distress Head: Normocephalic, without obvious abnormality, atraumatic Neck: no adenopathy, no JVD, supple, symmetrical, trachea midline and thyroid not enlarged, symmetric, no tenderness/mass/nodules Lymph nodes: Cervical, supraclavicular, and axillary nodes normal. Resp: clear to auscultation bilaterally Back: symmetric, no curvature. ROM normal. No CVA tenderness. Cardio: regular rate and rhythm, S1, S2 normal, no murmur, click, rub or gallop GI: soft, non-tender; bowel sounds normal; no masses,  no organomegaly Extremities: extremities normal, atraumatic, no cyanosis or edema Neurologic: Alert and oriented X 3, normal strength and tone. Normal symmetric reflexes. Normal coordination and gait  ECOG PERFORMANCE STATUS: 1 - Symptomatic but completely ambulatory  Blood pressure 126/81, pulse 88, temperature 98.2 F (36.8 C), temperature source Temporal, resp. rate 17, height '5\' 7"'  (1.702 m), weight 120 lb 3.2 oz (54.5 kg), SpO2 98 %.  LABORATORY DATA: Lab Results  Component Value Date   WBC 5.4 05/23/2019   HGB 11.2 (L) 05/23/2019   HCT 34.4 (L) 05/23/2019   MCV 94.5 05/23/2019   PLT 463 (H) 05/23/2019      Chemistry      Component Value Date/Time   NA 138 05/23/2019 1043   K 3.5 05/23/2019 1043   CL 99 05/23/2019 1043   CO2 29 05/23/2019 1043   BUN 12 05/23/2019 1043   CREATININE 0.54 05/23/2019 1043      Component Value Date/Time   CALCIUM 9.0 05/23/2019 1043   ALKPHOS 144 (H) 05/23/2019 1043   AST 23 05/23/2019 1043   ALT 14 05/23/2019 1043   BILITOT 0.3 05/23/2019 1043       RADIOGRAPHIC STUDIES: CT HEAD WO CONTRAST  Result Date: 05/14/2019 CLINICAL DATA:  [place] with trauma to the face EXAM: CT HEAD WITHOUT CONTRAST TECHNIQUE: Contiguous axial images were obtained from the base of the skull through the vertex without intravenous contrast. COMPARISON:  04/06/2019 FINDINGS: Brain: No acute or traumatic finding. Chronic small-vessel  ischemic changes affect the pons. No focal cerebellar finding. Cerebral hemispheres show old small vessel infarctions of the thalami, basal ganglia left more than right and hemispheric deep white matter. Previous left frontal vertex craniotomy for tumor resection. No sign of increasing mass, edema or hemorrhage in that region. No hydrocephalus or extra-axial collection. Vascular: There is atherosclerotic calcification of the major vessels at the base of the brain. Skull: No acute or traumatic finding. Sinuses/Orbits: Clear/normal Other: None IMPRESSION: No acute or traumatic finding. Chronic small-vessel ischemic changes throughout the brain as noted above. Previous left frontal vertex craniotomy for tumor resection. No unexpected finding in that location by CT. Electronically Signed   By: Nelson Chimes M.D.   On: 05/14/2019 12:23   CT Angio Chest PE W and/or Wo Contrast  Result Date: 05/13/2019 CLINICAL DATA:  Shortness of breath. Recent diagnosis of metastatic  non-small cell cancer. EXAM: CT ANGIOGRAPHY CHEST WITH CONTRAST TECHNIQUE: Multidetector CT imaging of the chest was performed using the standard protocol during bolus administration of intravenous contrast. Multiplanar CT image reconstructions and MIPs were obtained to evaluate the vascular anatomy. CONTRAST:  155m OMNIPAQUE IOHEXOL 350 MG/ML SOLN COMPARISON:  PET CT 7 days ago FINDINGS: Cardiovascular: Positive for acute pulmonary embolus with filling defect in the lingular and left lower lobar pulmonary arteries. Thromboembolic burden is small. Aortic atherosclerosis without dissection. Heart size normal. Minimal pericardial fluid. Right chest port in place, tip in the atrial caval junction. Mediastinum/Nodes: Multifocal bulky mediastinal and right hilar adenopathy, most prominent involving the right lower paratracheal region. No a conglomerate measures up to 3.6 cm, not significantly changed from prior PET allowing for differences in technique. Right  supraclavicular adenopathy with node measuring 2.1 cm. There also enlarged left supraclavicular nodes. No esophageal wall thickening. Lungs/Pleura: Small bilateral pleural effusions have improved from prior PET. Right pleural effusion extends into the fissure. Multiple pulmonary nodules throughout both lungs. Dominant lesion in the right upper lobe extension the hilum towards the apex with surrounding spiculations. Some of these nodules are more conspicuous given decrease in pleural fluid from prior. Otherwise no significant change in nodule since prior PET. Trachea and mainstem bronchi are patent. Upper Abdomen: Assessed on concurrent abdominal CT, reported separately. Musculoskeletal: Osseous metastatic disease, assessed on recent PET. This includes destructive lesion involving left anterior sixth rib. Many of the region of hypermetabolism on PET have faint or no CT correlate. Review of the MIP images confirms the above findings. IMPRESSION: 1. Positive for acute pulmonary embolus in the lingular and left lower lobar pulmonary arteries. Thromboembolic burden is small. 2. Small bilateral pleural effusions, improved since PET CT 1 week ago. 3. Recent diagnosis of non-small cell lung cancer with recent staging PET scan 1 week ago. Multiple pulmonary nodules, multifocal thoracic adenopathy, and osseous metastatic disease. Overall no significant change. 4.  Aortic Atherosclerosis (ICD10-I70.0). Critical Value/emergent results were called by telephone at the time of interpretation on 05/13/2019 at 6:51 am to Dr [facility], who verbally acknowledged these results. Electronically Signed   By: MKeith RakeM.D.   On: 05/13/2019 06:51   CT ABDOMEN PELVIS W CONTRAST  Result Date: 05/13/2019 CLINICAL DATA:  Abdominal pain. Urinary frequency. Right-sided pain. EXAM: CT ABDOMEN AND PELVIS WITH CONTRAST TECHNIQUE: Multidetector CT imaging of the abdomen and pelvis was performed using the standard protocol following bolus  administration of intravenous contrast. CONTRAST:  1029mOMNIPAQUE IOHEXOL 350 MG/ML SOLN COMPARISON:  Recent PET CT 05/06/2019, abdominal CT 04/02/2019 FINDINGS: Lower chest: Assessed on concurrent chest CT. Small pleural effusions. Hepatobiliary: Small low-density lesions in the liver consistent with cyst. There is no additional vague hypodensity in the right lobe measuring 16 mm, previously with lobulated enhancement, likely hemangioma. No new liver lesion. Gallbladder physiologically distended, no calcified stone. No biliary dilatation. Pancreas: No ductal dilatation or inflammation. Spleen: Calcified granuloma. Normal in size. Adrenals/Urinary Tract: Mild left adrenal thickening again seen. Normal right adrenal gland. Heterogeneous enhancement of the right kidney with patchy nephrogram suspicious for pyelonephritis. No hydronephrosis. No renal fluid collection. Small low-density lesion in the upper left kidney is similar to prior exam. Mild urinary bladder wall thickening. No urolithiasis. Stomach/Bowel: Stomach partially distended. No gastric wall thickening. No bowel obstruction or inflammation. Appendix not confidently visualized, no evidence of appendicitis. Vascular/Lymphatic: Aortic atherosclerosis. No aneurysm. 10 mm left periaortic node unchanged from recent PET, series 4, image 37. No  new adenopathy. Reproductive: Small uterine fibroid. No adnexal mass. Other: No ascites. No free air. Musculoskeletal: Destructive lytic lesion within L3 vertebral body, unchanged from recent PET. No new osseous abnormality. IMPRESSION: 1. Findings suspicious for cystitis and right pyelonephritis. 2. Destructive lytic lesion within L3 vertebral body, unchanged from recent PET. Electronically Signed   By: Keith Rake M.D.   On: 05/13/2019 07:00   DG Chest Port 1 View  Result Date: 05/13/2019 CLINICAL DATA:  Chest pain. Recent diagnosis of lung cancer. EXAM: PORTABLE CHEST 1 VIEW COMPARISON:  Radiograph 04/01/2019.  PET CT 05/06/2019 reviewed FINDINGS: Patient is rotated. Right chest port with tip in the lower SVC. No pneumothorax. Right pleural effusion. Left pleural effusion on PET CT not well seen. Right paratracheal soft tissue density, likely related to underlying pulmonary mass. Patchy opacity in the left perihilar lung. No pneumothorax. Bone lesions on prior PET are not well demonstrated radiographically. IMPRESSION: 1. Right pleural effusion. Left pleural effusion on recent PET not visualized radiographically. Right paratracheal soft tissue density likely related to known pulmonary mass. 2. Patchy opacities in the left perihilar lung likely correspond pulmonary nodules on PET. 3. Right chest port in place, tip in the SVC. Electronically Signed   By: Keith Rake M.D.   On: 05/13/2019 05:01   IR IMAGING GUIDED PORT INSERTION  Result Date: 05/08/2019 INDICATION: 64 year old female with stage IV metastatic lung cancer including brain metastases. She presents for port catheter placement. EXAM: IMPLANTED PORT A CATH PLACEMENT WITH ULTRASOUND AND FLUOROSCOPIC GUIDANCE MEDICATIONS: 2 g Ancef and 25 mg Benadryl; The antibiotic was administered within an appropriate time interval prior to skin puncture. ANESTHESIA/SEDATION: Versed 2 mg IV; Fentanyl 100 mcg IV; Moderate Sedation Time:  18 minutes The patient was continuously monitored during the procedure by the interventional radiology nurse under my direct supervision. FLUOROSCOPY TIME:  0 minutes, 12 seconds (2 mGy) COMPLICATIONS: None immediate. PROCEDURE: The right neck and chest was prepped with chlorhexidine, and draped in the usual sterile fashion using maximum barrier technique (cap and mask, sterile gown, sterile gloves, large sterile sheet, hand hygiene and cutaneous antiseptic). Local anesthesia was attained by infiltration with 1% lidocaine with epinephrine. Ultrasound demonstrated patency of the right internal jugular vein, and this was documented with an  image. Under real-time ultrasound guidance, this vein was accessed with a 21 gauge micropuncture needle and image documentation was performed. A small dermatotomy was made at the access site with an 11 scalpel. A 0.018" wire was advanced into the SVC and the access needle exchanged for a 14F micropuncture vascular sheath. The 0.018" wire was then removed and a 0.035" wire advanced into the IVC. An appropriate location for the subcutaneous reservoir was selected below the clavicle and an incision was made through the skin and underlying soft tissues. The subcutaneous tissues were then dissected using a combination of blunt and sharp surgical technique and a pocket was formed. A single lumen low-profile power injectable portacatheter was then tunneled through the subcutaneous tissues from the pocket to the dermatotomy and the port reservoir placed within the subcutaneous pocket. The venous access site was then serially dilated and a peel away vascular sheath placed over the wire. The wire was removed and the port catheter advanced into position under fluoroscopic guidance. The catheter tip is positioned in the superior cavoatrial junction. This was documented with a spot image. The portacatheter was then tested and found to flush and aspirate well. The port was flushed with saline followed by 100 units/mL heparinized  saline. The pocket was then closed in two layers using first subdermal inverted interrupted absorbable sutures followed by a running subcuticular suture. The epidermis was then sealed with Dermabond. The dermatotomy at the venous access site was also closed with Dermabond. IMPRESSION: Successful placement of a right IJ approach Power Port with ultrasound and fluoroscopic guidance. The catheter is ready for use. Electronically Signed   By: Jacqulynn Cadet M.D.   On: 05/08/2019 15:58   ECHOCARDIOGRAM LIMITED  Result Date: 05/13/2019   ECHOCARDIOGRAM LIMITED REPORT   Patient Name:   KIMELA MALSTROM Date  of Exam: 05/13/2019 Medical Rec #:  [id number]     Height:       67.0 in Accession #:    [id number]    Weight:       134.0 lb Date of Birth:  December 18, 1955      BSA:          1.71 m Patient Age:    64 years      BP:           123/104 mmHg Patient Gender: F             HR:           103 bpm. Exam Location:  Inpatient  Procedure: Limited Echo Indications:    I26.02 Pulmonary embolus  History:        Patient has no prior history of Echocardiogram examinations.  Sonographer:    Tiffany Dance Referring Phys: [id number] RONDELL A SMITH IMPRESSIONS  1. Only 2 images available, extremely limited, patient refused further imaging. Normal LV size and thickness. Grossly normal LV function, EF looks like 60-65% from parasternal long axis. Very limited RV view appears to show normal systolic function. Small anterior pericardial effusion. FINDINGS  Left Ventricle: Only 2 images available. Normal LV size and thickness. Grossly normal LV function, EF looks like 60-65% from parasternal long axis. Very limited RV view appears to show normal systolic function. Small anterior pericardial effusion.  Judy Champagne MD Electronically signed by Judy Champagne MD Signature Date/Time: 05/13/2019/2:31:39 PM   Final     ASSESSMENT AND PLAN: This is a very pleasant 64 years old white female recently diagnosed with stage IV non-small cell lung cancer, adenocarcinoma with no actionable mutations on the recent tissue molecular studies diagnosed in December 2020 and presented with right upper lobe lung mass in addition to bilateral pulmonary nodules as well as extensive lymphadenopathy and metastatic disease to the brain, and bones.  She is status post craniotomy with resection of solitary brain metastasis.  The patient also status post palliative radiotherapy to metastatic bone lesions in the left scapula and ribs.   She started the first cycle of her chemotherapy with carboplatin, Alimta and Keytruda 3 weeks ago.  She has a rough time with the first cycle  but she was also recovering from any other procedure including her brain surgery and palliative radiation which may have contributed additional toxicity to her chemotherapy regimen. I had a lengthy discussion with the patient and her husband today about her current condition and treatment options. I explained to the patient and her husband that she is receiving excellent care from Dr. Marin Olp and I will have recommended similar treatment for her condition. I strongly recommended for her to reconsider the chemotherapy option but we may have to reduce the dose of carboplatin and Alimta starting from cycle #2 for better tolerance of this regimen.  The patient also knows that starting from cycle #5 she  will be treated only with maintenance treatment with Alimta and Keytruda or only single agent Keytruda if she is still have persistent and tolerability issues with chemotherapy. I also discussed with the patient other options including chemotherapy free option with ipilimumab and nivolumab.  I explained to the patient and her husband that this will be a second option if she decided not to proceed with chemotherapy. I will also order blood test with [employer] for evaluation of any missed mutations on the tissue study.  The patient and her husband were interested in proceeding with the molecular study today. For the history of deep venous thrombosis and pulmonary embolism, the patient will continue her current treatment with Lovenox. The patient will resume her treatment with Dr. Marin Olp next week.  I discussed this plan with Dr. Marin Olp. She knows to reach me if she has any questions. The patient voices understanding of current disease status and treatment options and is in agreement with the current care plan.  All questions were answered. The patient knows to call the clinic with any problems, questions or concerns. We can certainly see the patient much sooner if necessary.  The total time spent in the  appointment was 60 minutes.  Disclaimer: This note was dictated with voice recognition software. Similar sounding words can inadvertently be transcribed and may not be corrected upon review.

## 2019-06-09 NOTE — Progress Notes (Signed)
  Radiation Oncology         [phone] ________________________________  Name: [name] MRN: [id number]  Date: 06/07/2019  DOB: [birth year]  SIMULATION AND TREATMENT PLANNING NOTE    ICD-10-CM   1. Adenocarcinoma of lung, stage 4, right (HCC)  C34.91     DIAGNOSIS:  64 yo woman with adenocarcinoma of the lung at risk for SVC syndrome  NARRATIVE:  The patient was brought to the [facility].  Identity was confirmed.  All relevant records and images related to the planned course of therapy were reviewed.  The patient freely provided informed written consent to proceed with treatment after reviewing the details related to the planned course of therapy. The consent form was witnessed and verified by the simulation staff.  Then, the patient was set-up in a stable reproducible  supine position for radiation therapy.  CT images were obtained.  Surface markings were placed.  The CT images were loaded into the planning software.  Then the target and avoidance structures were contoured.  Treatment planning then occurred.  The radiation prescription was entered and confirmed.  Then, I designed and supervised the construction of a total of 3 medically necessary complex treatment devices as MLCs to shield lungs.  I have requested : 3D Simulation  I have requested a DVH of the following structures: left lung, right lung, heart and targets.  PLAN:  The patient will receive 30 Gy in 10 fraction.  ________________________________  Sheral Apley Tammi Klippel, M.D.

## 2019-06-10 ENCOUNTER — Telehealth: Payer: Self-pay | Admitting: Radiation Oncology

## 2019-06-10 ENCOUNTER — Encounter: Payer: Self-pay | Admitting: *Deleted

## 2019-06-10 ENCOUNTER — Encounter: Payer: BC Managed Care – PPO | Admitting: Physical Medicine and Rehabilitation

## 2019-06-10 ENCOUNTER — Ambulatory Visit: Payer: BC Managed Care – PPO

## 2019-06-10 NOTE — Telephone Encounter (Signed)
Received voicemail from patient's husband, [name], requesting to speak with Dr. Tammi Klippel before his wife's "procedure tomorrow." Patient scheduled to start radiation for her supraclavicular neck adenopathy. Phoned [name] back promptly to inquire. Eric verbalizes need for clarification "as to why Dr. Tammi Klippel is only treating one area." [name] reports his wife has pain in the center of her back, right shoulder, right arm and "that area where her port is." He questions if his wife's "throat will close up." Discussed potential for esophagitis and management. Also, [name] expresses frustration that they didn't get to speak with Dr. Tammi Klippel after his wife's simulation on Friday as he understood they would.   [name] understands this RN will seek clarification on his behalf from Dr. Tammi Klippel and phone him back.

## 2019-06-10 NOTE — Telephone Encounter (Signed)
I called the patient's husband back after he called and left a message asking about the site that is being treated. We reviewed the chest adenopathy including the hilar area close to the SVC, and her supraclavicular nodes were the sites being treated. She will come tomorrow for therapy.

## 2019-06-11 ENCOUNTER — Ambulatory Visit: Admission: RE | Admit: 2019-06-11 | Payer: BC Managed Care – PPO | Source: Ambulatory Visit

## 2019-06-11 ENCOUNTER — Ambulatory Visit: Payer: BC Managed Care – PPO

## 2019-06-11 ENCOUNTER — Other Ambulatory Visit: Payer: Self-pay

## 2019-06-11 DIAGNOSIS — G9389 Other specified disorders of brain: Secondary | ICD-10-CM | POA: Diagnosis not present

## 2019-06-11 DIAGNOSIS — C7931 Secondary malignant neoplasm of brain: Secondary | ICD-10-CM | POA: Diagnosis not present

## 2019-06-12 ENCOUNTER — Ambulatory Visit
Admission: RE | Admit: 2019-06-12 | Discharge: 2019-06-12 | Disposition: A | Discharge status: Home / Self Care | Payer: BC Managed Care – PPO | Source: Ambulatory Visit | Attending: Radiation Oncology | Admitting: Radiation Oncology

## 2019-06-12 ENCOUNTER — Ambulatory Visit: Payer: BC Managed Care – PPO

## 2019-06-12 DIAGNOSIS — G9389 Other specified disorders of brain: Secondary | ICD-10-CM | POA: Diagnosis not present

## 2019-06-12 DIAGNOSIS — C7931 Secondary malignant neoplasm of brain: Secondary | ICD-10-CM | POA: Diagnosis not present

## 2019-06-12 DIAGNOSIS — C7951 Secondary malignant neoplasm of bone: Secondary | ICD-10-CM | POA: Diagnosis not present

## 2019-06-13 ENCOUNTER — Encounter: Payer: Self-pay | Admitting: *Deleted

## 2019-06-13 ENCOUNTER — Inpatient Hospital Stay: Payer: BC Managed Care – PPO | Admitting: Hematology & Oncology

## 2019-06-13 ENCOUNTER — Inpatient Hospital Stay: Payer: BC Managed Care – PPO

## 2019-06-13 ENCOUNTER — Ambulatory Visit: Payer: BC Managed Care – PPO

## 2019-06-13 ENCOUNTER — Ambulatory Visit: Admission: RE | Admit: 2019-06-13 | Payer: BC Managed Care – PPO | Source: Ambulatory Visit

## 2019-06-13 ENCOUNTER — Other Ambulatory Visit: Payer: Self-pay

## 2019-06-13 ENCOUNTER — Telehealth: Payer: Self-pay | Admitting: Hematology & Oncology

## 2019-06-13 ENCOUNTER — Other Ambulatory Visit: Payer: Self-pay | Admitting: Hematology & Oncology

## 2019-06-13 DIAGNOSIS — M25551 Pain in right hip: Secondary | ICD-10-CM

## 2019-06-13 DIAGNOSIS — C7931 Secondary malignant neoplasm of brain: Secondary | ICD-10-CM | POA: Diagnosis not present

## 2019-06-13 DIAGNOSIS — I2699 Other pulmonary embolism without acute cor pulmonale: Secondary | ICD-10-CM

## 2019-06-13 DIAGNOSIS — C7951 Secondary malignant neoplasm of bone: Secondary | ICD-10-CM

## 2019-06-13 DIAGNOSIS — C3491 Malignant neoplasm of unspecified part of right bronchus or lung: Secondary | ICD-10-CM

## 2019-06-13 DIAGNOSIS — G9389 Other specified disorders of brain: Secondary | ICD-10-CM | POA: Diagnosis not present

## 2019-06-13 DIAGNOSIS — C3490 Malignant neoplasm of unspecified part of unspecified bronchus or lung: Secondary | ICD-10-CM

## 2019-06-13 MED ORDER — ENOXAPARIN SODIUM 100 MG/ML ~~LOC~~ SOLN: 100.0000 mg | SUBCUTANEOUS | 3 refills | Status: DC

## 2019-06-13 MED ORDER — MORPHINE SULFATE 15 MG PO TABS: 15.0000 mg | ORAL_TABLET | ORAL | 0 refills | Status: DC | PRN

## 2019-06-13 MED ORDER — SENNOSIDES-DOCUSATE SODIUM 8.6-50 MG PO TABS: 1.0000 | ORAL_TABLET | Freq: Two times a day (BID) | ORAL | Status: DC

## 2019-06-13 MED ORDER — FENTANYL 75 MCG/HR TD PT72: 2.0000 | MEDICATED_PATCH | TRANSDERMAL | 0 refills | Status: DC

## 2019-06-13 NOTE — Telephone Encounter (Signed)
lvm to inform patient and husband of resched 2/18 appts to 2/23 at 10 am in [facility]. Also that her Radonc appts for that day has been changed to 230 pm

## 2019-06-14 ENCOUNTER — Ambulatory Visit: Payer: BC Managed Care – PPO

## 2019-06-14 ENCOUNTER — Other Ambulatory Visit: Payer: Self-pay

## 2019-06-14 ENCOUNTER — Ambulatory Visit
Admission: RE | Admit: 2019-06-14 | Discharge: 2019-06-14 | Disposition: A | Discharge status: Home / Self Care | Payer: BC Managed Care – PPO | Source: Ambulatory Visit | Attending: Radiation Oncology | Admitting: Radiation Oncology

## 2019-06-14 ENCOUNTER — Inpatient Hospital Stay: Payer: BC Managed Care – PPO

## 2019-06-14 DIAGNOSIS — G9389 Other specified disorders of brain: Secondary | ICD-10-CM | POA: Diagnosis not present

## 2019-06-14 DIAGNOSIS — C7931 Secondary malignant neoplasm of brain: Secondary | ICD-10-CM | POA: Diagnosis not present

## 2019-06-15 ENCOUNTER — Encounter: Payer: Self-pay | Admitting: Hematology & Oncology

## 2019-06-15 ENCOUNTER — Encounter: Payer: Self-pay | Admitting: Radiation Oncology

## 2019-06-15 NOTE — Progress Notes (Signed)
Ms. Auzenne significant other called this AM due to patient's increased mid lumbar pain and a newly noticed area of swelling in that region of her back.  Patient's pain is still controlled adequately with her fentanyl and prn morphine. No new neurologic deficits. Able to walk, urinate, make BMs, and no new weakness or numbness.  Imaging reviewed by me--- It is possible she has progression of the metastasis at L3.  I am informing Dr. Tammi Klippel and his team and asking London Pepper, RN or Freeman Caldron PA to get in touch with the patient on Monday about Dr. Johny Shears recommendations for further workup or management.  Husband states the main reason for his call was to inform the team.  He does not feel her symptoms are intolerable. Given that she lacks new neurologic deficits he and I agree she does not need to go to the ED right now but he knows not to hesitate to do so if new neurologic deficits develop over the weekend or her symptoms are intolerable.  He expressed his appreciation for the advice.  -----------------------------------  Eppie Gibson, MD

## 2019-06-17 ENCOUNTER — Ambulatory Visit: Payer: BC Managed Care – PPO

## 2019-06-17 ENCOUNTER — Other Ambulatory Visit: Payer: Self-pay

## 2019-06-17 ENCOUNTER — Ambulatory Visit
Admission: RE | Admit: 2019-06-17 | Discharge: 2019-06-17 | Disposition: A | Discharge status: Home / Self Care | Payer: BC Managed Care – PPO | Source: Ambulatory Visit | Attending: Radiation Oncology | Admitting: Radiation Oncology

## 2019-06-17 DIAGNOSIS — C7931 Secondary malignant neoplasm of brain: Secondary | ICD-10-CM | POA: Diagnosis not present

## 2019-06-17 DIAGNOSIS — G9389 Other specified disorders of brain: Secondary | ICD-10-CM | POA: Diagnosis not present

## 2019-06-18 ENCOUNTER — Ambulatory Visit: Payer: BC Managed Care – PPO

## 2019-06-18 ENCOUNTER — Inpatient Hospital Stay: Payer: BC Managed Care – PPO

## 2019-06-18 ENCOUNTER — Encounter: Payer: Self-pay | Admitting: Hematology & Oncology

## 2019-06-18 ENCOUNTER — Other Ambulatory Visit: Payer: Self-pay

## 2019-06-18 ENCOUNTER — Inpatient Hospital Stay (HOSPITAL_BASED_OUTPATIENT_CLINIC_OR_DEPARTMENT_OTHER): Payer: BC Managed Care – PPO | Admitting: Hematology & Oncology

## 2019-06-18 ENCOUNTER — Telehealth: Payer: Self-pay | Admitting: Radiation Oncology

## 2019-06-18 ENCOUNTER — Ambulatory Visit
Admission: RE | Admit: 2019-06-18 | Discharge: 2019-06-18 | Disposition: A | Discharge status: Home / Self Care | Payer: BC Managed Care – PPO | Source: Ambulatory Visit | Attending: Radiation Oncology | Admitting: Radiation Oncology

## 2019-06-18 ENCOUNTER — Ambulatory Visit
Admission: RE | Admit: 2019-06-18 | Payer: BC Managed Care – PPO | Source: Ambulatory Visit | Admitting: Radiation Oncology

## 2019-06-18 VITALS — BP 143/85 | HR 98 | Temp 97.3°F | Resp 20 | Wt 116.6 lb

## 2019-06-18 VITALS — BP 141/87 | HR 75

## 2019-06-18 DIAGNOSIS — R0609 Other forms of dyspnea: Secondary | ICD-10-CM | POA: Diagnosis not present

## 2019-06-18 DIAGNOSIS — Z7901 Long term (current) use of anticoagulants: Secondary | ICD-10-CM | POA: Diagnosis not present

## 2019-06-18 DIAGNOSIS — C3491 Malignant neoplasm of unspecified part of right bronchus or lung: Secondary | ICD-10-CM

## 2019-06-18 DIAGNOSIS — C7931 Secondary malignant neoplasm of brain: Secondary | ICD-10-CM

## 2019-06-18 DIAGNOSIS — M549 Dorsalgia, unspecified: Secondary | ICD-10-CM | POA: Diagnosis not present

## 2019-06-18 DIAGNOSIS — C3490 Malignant neoplasm of unspecified part of unspecified bronchus or lung: Secondary | ICD-10-CM

## 2019-06-18 DIAGNOSIS — R110 Nausea: Secondary | ICD-10-CM | POA: Diagnosis not present

## 2019-06-18 DIAGNOSIS — Z9221 Personal history of antineoplastic chemotherapy: Secondary | ICD-10-CM | POA: Diagnosis not present

## 2019-06-18 DIAGNOSIS — C7951 Secondary malignant neoplasm of bone: Secondary | ICD-10-CM | POA: Diagnosis not present

## 2019-06-18 DIAGNOSIS — G9389 Other specified disorders of brain: Secondary | ICD-10-CM | POA: Diagnosis not present

## 2019-06-18 DIAGNOSIS — Z79899 Other long term (current) drug therapy: Secondary | ICD-10-CM | POA: Diagnosis not present

## 2019-06-18 DIAGNOSIS — Z86718 Personal history of other venous thrombosis and embolism: Secondary | ICD-10-CM | POA: Diagnosis not present

## 2019-06-18 DIAGNOSIS — R531 Weakness: Secondary | ICD-10-CM | POA: Diagnosis not present

## 2019-06-18 DIAGNOSIS — R091 Pleurisy: Secondary | ICD-10-CM | POA: Diagnosis not present

## 2019-06-18 DIAGNOSIS — M1990 Unspecified osteoarthritis, unspecified site: Secondary | ICD-10-CM | POA: Diagnosis not present

## 2019-06-18 DIAGNOSIS — C3411 Malignant neoplasm of upper lobe, right bronchus or lung: Secondary | ICD-10-CM | POA: Diagnosis not present

## 2019-06-18 DIAGNOSIS — Z791 Long term (current) use of non-steroidal anti-inflammatories (NSAID): Secondary | ICD-10-CM | POA: Diagnosis not present

## 2019-06-18 DIAGNOSIS — I10 Essential (primary) hypertension: Secondary | ICD-10-CM | POA: Diagnosis not present

## 2019-06-18 DIAGNOSIS — Z5112 Encounter for antineoplastic immunotherapy: Secondary | ICD-10-CM | POA: Diagnosis not present

## 2019-06-18 LAB — CBC WITH DIFFERENTIAL (CANCER CENTER ONLY)
Abs Immature Granulocytes: 0.03 10*3/uL (ref 0.00–0.07)
Basophils Absolute: 0 10*3/uL (ref 0.0–0.1)
Basophils Relative: 1 %
Eosinophils Absolute: 0 10*3/uL (ref 0.0–0.5)
Eosinophils Relative: 0 %
HCT: 34.9 % — ABNORMAL LOW (ref 36.0–46.0)
Hemoglobin: 11.2 g/dL — ABNORMAL LOW (ref 12.0–15.0)
Immature Granulocytes: 1 %
Lymphocytes Relative: 8 %
Lymphs Abs: 0.5 10*3/uL — ABNORMAL LOW (ref 0.7–4.0)
MCH: 31.2 pg (ref 26.0–34.0)
MCHC: 32.1 g/dL (ref 30.0–36.0)
MCV: 97.2 fL (ref 80.0–100.0)
Monocytes Absolute: 0.5 10*3/uL (ref 0.1–1.0)
Monocytes Relative: 8 %
Neutro Abs: 5.5 10*3/uL (ref 1.7–7.7)
Neutrophils Relative %: 82 %
Platelet Count: 637 10*3/uL — ABNORMAL HIGH (ref 150–400)
RBC: 3.59 MIL/uL — ABNORMAL LOW (ref 3.87–5.11)
RDW: 16.1 % — ABNORMAL HIGH (ref 11.5–15.5)
WBC Count: 6.6 10*3/uL (ref 4.0–10.5)
nRBC: 0 % (ref 0.0–0.2)

## 2019-06-18 LAB — CMP (CANCER CENTER ONLY)
ALT: 11 U/L (ref 0–44)
AST: 21 U/L (ref 15–41)
Albumin: 3.6 g/dL (ref 3.5–5.0)
Alkaline Phosphatase: 143 U/L — ABNORMAL HIGH (ref 38–126)
Anion gap: 8 (ref 5–15)
BUN: 8 mg/dL (ref 8–23)
CO2: 28 mmol/L (ref 22–32)
Calcium: 9.1 mg/dL (ref 8.9–10.3)
Chloride: 102 mmol/L (ref 98–111)
Creatinine: 0.45 mg/dL (ref 0.44–1.00)
GFR, Est AFR Am: >60 mL/min (ref >60)
GFR, Estimated: >60 mL/min (ref >60)
Glucose, Bld: 97 mg/dL (ref 70–99)
Potassium: 4 mmol/L (ref 3.5–5.1)
Sodium: 138 mmol/L (ref 135–145)
Total Bilirubin: 0.3 mg/dL (ref 0.3–1.2)
Total Protein: 6.6 g/dL (ref 6.5–8.1)

## 2019-06-18 LAB — LACTATE DEHYDROGENASE: LDH: 256 U/L — ABNORMAL HIGH (ref 98–192)

## 2019-06-18 MED ORDER — HYDROMORPHONE HCL 1 MG/ML IJ SOLN
INTRAMUSCULAR | Status: AC
  Filled 2019-06-18: qty 2

## 2019-06-18 MED ORDER — HYDROMORPHONE HCL 1 MG/ML IJ SOLN
2.0000 mg | INTRAMUSCULAR | Status: DC | PRN
  Administered 2019-06-18: 2 mg via INTRAVENOUS

## 2019-06-18 MED ORDER — GRANISETRON 3.1 MG/24HR TD PTCH: MEDICATED_PATCH | TRANSDERMAL | 4 refills | Status: DC

## 2019-06-18 MED ORDER — SODIUM CHLORIDE 0.9 % IV SOLN
200.0000 mg | Freq: Once | INTRAVENOUS | Status: AC
  Administered 2019-06-18: 200 mg via INTRAVENOUS
  Filled 2019-06-18: qty 8

## 2019-06-18 MED ORDER — PALONOSETRON HCL INJECTION 0.25 MG/5ML
INTRAVENOUS | Status: AC
  Filled 2019-06-18: qty 5

## 2019-06-18 MED ORDER — SODIUM CHLORIDE 0.9% FLUSH
10.0000 mL | INTRAVENOUS | Status: DC | PRN
  Administered 2019-06-18: 10 mL
  Filled 2019-06-18: qty 10

## 2019-06-18 MED ORDER — HEPARIN SOD (PORK) LOCK FLUSH 100 UNIT/ML IV SOLN
500.0000 [IU] | Freq: Once | INTRAVENOUS | Status: AC | PRN
  Administered 2019-06-18: 500 [IU]
  Filled 2019-06-18: qty 5

## 2019-06-18 MED ORDER — SODIUM CHLORIDE 0.9 % IV SOLN
Freq: Once | INTRAVENOUS | Status: AC
  Filled 2019-06-18: qty 250

## 2019-06-18 MED ORDER — DEXAMETHASONE SODIUM PHOSPHATE 10 MG/ML IJ SOLN
INTRAMUSCULAR | Status: AC
  Filled 2019-06-18: qty 1

## 2019-06-18 MED ORDER — FENTANYL 50 MCG/HR TD PT72: 1.0000 | MEDICATED_PATCH | TRANSDERMAL | 0 refills | Status: DC

## 2019-06-18 NOTE — Patient Instructions (Signed)
Nivolumab injection What is this medicine? NIVOLUMAB (nye VOL ue mab) is a monoclonal antibody. It is used to treat colon cancer, esophageal cancer, head and neck cancer, Hodgkin lymphoma, kidney cancer, liver cancer, lung cancer, mesothelioma, melanoma, and urothelial cancer. This medicine may be used for other purposes; ask your health care provider or pharmacist if you have questions. COMMON BRAND NAME(S): Opdivo What should I tell my health care provider before I take this medicine? They need to know if you have any of these conditions:  diabetes  immune system problems  kidney disease  liver disease  lung disease  organ transplant  stomach or intestine problems  thyroid disease  an unusual or allergic reaction to nivolumab, other medicines, foods, dyes, or preservatives  pregnant or trying to get pregnant  breast-feeding How should I use this medicine? This medicine is for infusion into a vein. It is given by a health care professional in a hospital or clinic setting. A special MedGuide will be given to you before each treatment. Be sure to read this information carefully each time. Talk to your pediatrician regarding the use of this medicine in children. While this drug may be prescribed for children as young as 12 years for selected conditions, precautions do apply. Overdosage: If you think you have taken too much of this medicine contact a poison control center or emergency room at once. NOTE: This medicine is only for you. Do not share this medicine with others. What if I miss a dose? It is important not to miss your dose. Call your doctor or health care professional if you are unable to keep an appointment. What may interact with this medicine? Interactions have not been studied. Give your health care provider a list of all the medicines, herbs, non-prescription drugs, or dietary supplements you use. Also tell them if you smoke, drink alcohol, or use illegal drugs.  Some items may interact with your medicine. This list may not describe all possible interactions. Give your health care provider a list of all the medicines, herbs, non-prescription drugs, or dietary supplements you use. Also tell them if you smoke, drink alcohol, or use illegal drugs. Some items may interact with your medicine. What should I watch for while using this medicine? This drug may make you feel generally unwell. Continue your course of treatment even though you feel ill unless your doctor tells you to stop. You may need blood work done while you are taking this medicine. Do not become pregnant while taking this medicine or for 5 months after stopping it. Women should inform their doctor if they wish to become pregnant or think they might be pregnant. There is a potential for serious side effects to an unborn child. Talk to your health care professional or pharmacist for more information. Do not breast-feed an infant while taking this medicine or for 5 months after stopping it. What side effects may I notice from receiving this medicine? Side effects that you should report to your doctor or health care professional as soon as possible:  allergic reactions like skin rash, itching or hives, swelling of the face, lips, or tongue  breathing problems  blood in the urine  bloody or watery diarrhea or black, tarry stools  changes in emotions or moods  changes in vision  chest pain  cough  dizziness  feeling faint or lightheaded, falls  fever, chills  headache with fever, neck stiffness, confusion, loss of memory, sensitivity to light, hallucination, loss of contact with reality, or  seizures  joint pain  mouth sores  redness, blistering, peeling or loosening of the skin, including inside the mouth  severe muscle pain or weakness  signs and symptoms of high blood sugar such as dizziness; dry mouth; dry skin; fruity breath; nausea; stomach pain; increased hunger or thirst;  increased urination  signs and symptoms of kidney injury like trouble passing urine or change in the amount of urine  signs and symptoms of liver injury like dark yellow or brown urine; general ill feeling or flu-like symptoms; light-colored stools; loss of appetite; nausea; right upper belly pain; unusually weak or tired; yellowing of the eyes or skin  swelling of the ankles, feet, hands  trouble passing urine or change in the amount of urine  unusually weak or tired  weight gain or loss Side effects that usually do not require medical attention (report to your doctor or health care professional if they continue or are bothersome):  bone pain  constipation  decreased appetite  diarrhea  muscle pain  nausea, vomiting  tiredness This list may not describe all possible side effects. Call your doctor for medical advice about side effects. You may report side effects to FDA at 1-800-FDA-1088. Where should I keep my medicine? This drug is given in a hospital or clinic and will not be stored at home. NOTE: This sheet is a summary. It may not cover all possible information. If you have questions about this medicine, talk to your doctor, pharmacist, or health care provider.  2020 Elsevier/Gold Standard (2019-01-29 10:04:50)

## 2019-06-18 NOTE — Telephone Encounter (Signed)
-----   Message from Freeman Caldron, [place] sent at 06/18/2019  1:05 PM EST ----- Per my discussion with Dr. Tammi Klippel, please reach out to patient/husband, [name], to offer treatment of L3 in 1-2 fractions on Thurs./Fri. that would keep her on track to finish all radiation treatment this Friday.  If they want to proceed, we would need to add on CT SIM following her scheduled 2:30pm radiation treatment this afternoon.  Please help coordinate with her treatment machine and CT SIM. Thank you! -Ashlyn ----- Message ----- From: Heywood Footman, RN Sent: 06/18/2019  12:04 PM EST To: Freeman Caldron, PA-C  No. Not to my knowledge. I sent a message to [facility] yesterday but no response. Sam ----- Message ----- From: Freeman Caldron, PA-C Sent: 06/17/2019   9:27 PM EST To: Tyler Pita, MD, Heywood Footman, RN  Has this been addressed? Ailene Ards ----- Message ----- From: Eppie Gibson, MD Sent: 06/15/2019  10:04 AM EST To: Tyler Pita, MD, Freeman Caldron, PA-C, #  Hi, team, husband would appreciate being contacted on Monday (either by phone or when patient comes for tx) re: recommendations for her new symptoms. Thanks!

## 2019-06-18 NOTE — Telephone Encounter (Signed)
Received call from RT, Caryl Pina, reporting that Mrs. Redditt has decided against L spine treatment. Forwarded information onto Dr. Tammi Klippel and Freeman Caldron, PA-C.

## 2019-06-18 NOTE — Patient Instructions (Signed)

## 2019-06-18 NOTE — Progress Notes (Signed)
Hematology and Oncology Follow Up Visit  [name] [id number] 12/30/1955 64 y.o. 06/18/2019   Principle Diagnosis:  Metastatic adenocarcinoma of the lung-brain and bone metastasis -- NO actionable mutations  Current Therapy:    Carboplatinum/Alimta/pembrolizumab-cycle #1-to start on 05/09/2019  Xgeva 120 mg IM q 3 month -- next dose 07/2019     Interim History:  Ms. Hirth is back for follow-up.  She is having problems with nausea today.  She thinks this might be from the seaweed soup that she had last night.  She enjoys seaweed soup from [place].  She did see Dr. Julien Nordmann for a second opinion.  He did agree with our treatment plan.  She is getting radiation therapy right now.  I think she has another week ago.  I really do not think that she is able to take chemotherapy with the radiation therapy.  I do think that we should keep her on the immunotherapy portion.  She is having pain issues.  She is always had pain issues.  She is on a fentanyl patch at 125 mcg.  Apparently, 150 mcg makes her too tired.  She is on the oral morphine every 4 hours.  She is still doing Lovenox.  This is for her thromboembolic disease.  Hopefully, this will help the blood clots resolve.  She does not have any problems with diarrhea.  There is no fever.  She has had no obvious bleeding.  I know that she is incredibly nervous about chemotherapy.  I try to reassure her as much as possible.  I am unsure if we are going to be able to do any chemotherapy on her in the future.  However, a recent clinical trial showed that combination immunotherapy did have some good effectiveness in patients with nonsquamous cell lung cancer that did not have a high PD-L1 level.  Again, we are clearly dealing with quality of life issues.  Her right arm is certainly a little bit better.  She has a little bit more use in the right arm.  Currently, I would say her performance status is probably ECOG 2.  .  Medications:    Current Outpatient Medications:  .  acetaminophen (TYLENOL) 325 MG tablet, Take 2 tablets (650 mg total) by mouth every 6 (six) hours., Disp:  , Rfl:  .  dronabinol (MARINOL) 5 MG capsule, Take 1 capsule (5 mg total) by mouth 2 (two) times daily before lunch and supper., Disp: 60 capsule, Rfl: 0 .  enoxaparin (LOVENOX) 100 MG/ML injection, Inject 1 mL (100 mg total) into the skin daily., Disp: 90 mL, Rfl: 3 .  feeding supplement, ENSURE ENLIVE, (ENSURE ENLIVE) LIQD, Take 237 mLs by mouth 2 (two) times daily between meals., Disp: 237 mL, Rfl: 12 .  fentaNYL (DURAGESIC) 50 MCG/HR, Place 1 patch onto the skin every 3 (three) days. , Disp: , Rfl:  .  fentaNYL (DURAGESIC) 75 MCG/HR, Place 2 patches onto the skin every 3 (three) days. (Patient taking differently: Place 1 patch onto the skin every 3 (three) days. ), Disp: 15 patch, Rfl: 0 .  folic acid (FOLVITE) 1 MG tablet, Take 1 tablet (1 mg total) by mouth daily. Start 5-7 days before Alimta chemotherapy. Continue until 21 days after Alimta completed., Disp: 100 tablet, Rfl: 3 .  ketorolac (TORADOL) 10 MG tablet, Take 1 tablet (10 mg total) by mouth every 6 (six) hours as needed., Disp: 30 tablet, Rfl: 1 .  lactulose (CHRONULAC) 10 GM/15ML solution, Take 30 mLs (20 g total)  by mouth 2 (two) times daily., Disp: 1000 mL, Rfl: 4 .  lidocaine-prilocaine (EMLA) cream, Apply to affected area once, Disp: 30 g, Rfl: 3 .  lip balm (CARMEX) ointment, Apply topically as needed for lip care., Disp: 7 g, Rfl: 0 .  loratadine (CLARITIN) 10 MG tablet, Take 1 tablet (10 mg total) by mouth daily. (Patient taking differently: Take 10 mg by mouth daily as needed. ), Disp: 30 tablet, Rfl: 0 .  LORazepam (ATIVAN) 1 MG tablet, Take 1 tablet (1 mg total) by mouth every 6 (six) hours as needed for anxiety., Disp: 30 tablet, Rfl: 1 .  methocarbamol (ROBAXIN) 500 MG tablet, Take 1 tablet (500 mg total) by mouth every 6 (six) hours as needed for muscle spasms., Disp: 60 tablet,  Rfl: 3 .  morphine (MSIR) 15 MG tablet, Take 1 tablet (15 mg total) by mouth every 4 (four) hours as needed for severe pain., Disp: 90 tablet, Rfl: 0 .  Multiple Vitamin (MULTIVITAMIN WITH MINERALS) TABS tablet, Take 1 tablet by mouth daily., Disp: 30 tablet, Rfl: 0 .  ondansetron (ZOFRAN) 8 MG tablet, Take 1 tablet (8 mg total) by mouth 2 (two) times daily as needed (Nausea or vomiting). Start if needed on the third day after chemotherapy., Disp: 30 tablet, Rfl: 1 .  oxymetazoline (AFRIN) 0.05 % nasal spray, Place 1 spray into both nostrils 2 (two) times daily as needed for congestion., Disp: , Rfl:  .  pantoprazole (PROTONIX) 40 MG tablet, Take 1 tablet (40 mg total) by mouth daily with supper., Disp: 30 tablet, Rfl: 2 .  polyvinyl alcohol (LIQUIFILM TEARS) 1.4 % ophthalmic solution, Place 1 drop into both eyes as needed for dry eyes., Disp: , Rfl:  .  promethazine (PHENERGAN) 12.5 MG tablet, TAKE 1-2 TABLETS BY MOUTH EVERY 4 (FOUR) HOURS AS NEEDED FOR REFRACTORY NAUSEA / VOMITING., Disp: 30 tablet, Rfl: 3 .  senna-docusate (SENOKOT-S) 8.6-50 MG tablet, Take 1 tablet by mouth 2 (two) times daily., Disp:  , Rfl:  .  NARCAN 4 MG/0.1ML LIQD nasal spray kit, Place 1 spray into the nose once as needed (overdose). , Disp: , Rfl:  No current facility-administered medications for this visit.  Facility-Administered Medications Ordered in Other Visits:  .  heparin lock flush 100 unit/mL, 500 Units, Intracatheter, Once PRN, Kallan Bischoff, Rudell Cobb, MD .  pembrolizumab [facility]) 200 mg in sodium chloride 0.9 % 50 mL chemo infusion, 200 mg, Intravenous, Once, Volanda Napoleon, MD, Last Rate: 116 mL/hr at 06/18/19 1229, 200 mg at 06/18/19 1229 .  sodium chloride flush (NS) 0.9 % injection 10 mL, 10 mL, Intracatheter, PRN, Marin Olp, Rudell Cobb, MD  Allergies:  No Known Allergies  Past Medical History, Surgical history, Social history, and Family History were reviewed and updated.  Review of Systems: Review of  Systems  Constitutional: Negative.   HENT:  Negative.   Eyes: Negative.   Respiratory: Negative.   Cardiovascular: Negative.   Gastrointestinal: Negative.   Endocrine: Negative.   Genitourinary: Negative.    Musculoskeletal: Positive for arthralgias and back pain.  Skin: Negative.   Neurological: Positive for extremity weakness.  Hematological: Negative.   Psychiatric/Behavioral: Negative.     Physical Exam: Fairly well-developed well-nourished white female in mild distress secondary to pain.  Vital signs show temperature of 97.3.  Pulse 93.  Blood pressure 104/81.  Weight 232 pounds.  Head neck exam shows no ocular or oral lesions.  There are no palpable cervical or supraclavicular lymph nodes.  Lungs are  clear bilaterally.  Cardiac exam regular rate and rhythm with no murmurs, rubs or bruits.  Abdomen is soft.  Bowel sounds are slightly decreased.  She has no fluid wave.  There is no palpable liver or spleen tip.  Back exam shows some tenderness in the left scapula.  This is in the inferior portion of the scapula.  There is no tenderness over the actual spine or hips.  Extremities shows weakness in the right arm.  This is chronic.  Neurological exam shows the weakness in the right arm.  Skin exam shows no rashes, ecchymoses or petechia.    weight is 116 lb 9.6 oz (52.9 kg). Her temporal temperature is 97.3 F (36.3 C) (abnormal). Her blood pressure is 143/85 (abnormal) and her pulse is 98. Her respiration is 20 and oxygen saturation is 100%.   Wt Readings from Last 3 Encounters:  06/18/19 116 lb 9.6 oz (52.9 kg)  06/07/19 120 lb 3.2 oz (54.5 kg)  06/03/19 120 lb 6.4 oz (54.6 kg)    Physical Exam   Lab Results  Component Value Date   WBC 6.6 06/18/2019   HGB 11.2 (L) 06/18/2019   HCT 34.9 (L) 06/18/2019   MCV 97.2 06/18/2019   PLT 637 (H) 06/18/2019     Chemistry      Component Value Date/Time   NA 138 06/18/2019 1056   K 4.0 06/18/2019 1056   CL 102 06/18/2019 1056    CO2 28 06/18/2019 1056   BUN 8 06/18/2019 1056   CREATININE 0.45 06/18/2019 1056      Component Value Date/Time   CALCIUM 9.1 06/18/2019 1056   ALKPHOS 143 (H) 06/18/2019 1056   AST 21 06/18/2019 1056   ALT 11 06/18/2019 1056   BILITOT 0.3 06/18/2019 1056       Impression and Plan: Ms. Brian is a 64 year old yo female.  She is or was a smoker.  She now has metastatic adenocarcinoma of the right lung.  Hopefully, she will tolerate the immunotherapy without any problems.  I would think that this should be okay for her.  I think the real question is when to repeat her scans.  I probably would do scans on her in either late March or early April.  I will try her on a Sancuso patch.  Maybe this might help with the nausea.  I must say that she does have a very delicate sensitivity to medications.  I know it is been quite challenging to try to help manage her pain.  I just want to make sure that she is comfortable and functional.  This typically takes about 30-35 minutes when we see her.  She has a lot going on that we have to address.   Volanda Napoleon, MD 2/23/202112:53 PM

## 2019-06-18 NOTE — Telephone Encounter (Signed)
As requested by Freeman Caldron, PA-C this RN reached out to patient's husband, [name]. Explained that Dr. Tammi Klippel would like to offer treatment of his wife's L3 spine in 1-2 fractions on Thursday/Friday of this week. Explained if they would like to proceed, we would need to add on  CT SIM following her scheduled 1430 treatment. [name] explained his wife had a rough morning with nausea. He goes onto explain that he is headed now to pick her up from Dr. Casper Harrison office and head this way for radiation therapy. He states, "if my wife is in agreement and feeling up to it we would like treatment of L3." Informed CT/SIM staff and lead therapist, Trudee Kuster of these events. Trudee Kuster commits to getting the treatment and simulation done today in preparation for additional treatment on Thursday/Friday.

## 2019-06-19 ENCOUNTER — Ambulatory Visit: Payer: BC Managed Care – PPO

## 2019-06-19 ENCOUNTER — Ambulatory Visit: Admission: RE | Admit: 2019-06-19 | Payer: BC Managed Care – PPO | Source: Ambulatory Visit

## 2019-06-19 ENCOUNTER — Encounter: Payer: Self-pay | Admitting: *Deleted

## 2019-06-19 ENCOUNTER — Other Ambulatory Visit: Payer: Self-pay

## 2019-06-19 DIAGNOSIS — G9389 Other specified disorders of brain: Secondary | ICD-10-CM | POA: Diagnosis not present

## 2019-06-19 DIAGNOSIS — C7951 Secondary malignant neoplasm of bone: Secondary | ICD-10-CM | POA: Diagnosis not present

## 2019-06-19 DIAGNOSIS — C7931 Secondary malignant neoplasm of brain: Secondary | ICD-10-CM | POA: Diagnosis not present

## 2019-06-19 LAB — TSH: TSH: 0.597 u[IU]/mL (ref 0.308–3.960)

## 2019-06-19 LAB — T4: T4, Total: 9.5 ug/dL (ref 4.5–12.0)

## 2019-06-19 MED ORDER — GABAPENTIN 300 MG PO CAPS: 300.0000 mg | ORAL_CAPSULE | Freq: Three times a day (TID) | ORAL | 4 refills | Status: AC

## 2019-06-19 NOTE — Addendum Note (Signed)
Addended by: Volanda Napoleon on: 06/19/2019 04:39 PM   Modules accepted: Orders

## 2019-06-20 ENCOUNTER — Ambulatory Visit
Admission: RE | Admit: 2019-06-20 | Discharge: 2019-06-20 | Disposition: A | Discharge status: Home / Self Care | Payer: BC Managed Care – PPO | Source: Ambulatory Visit | Attending: Radiation Oncology | Admitting: Radiation Oncology

## 2019-06-20 ENCOUNTER — Ambulatory Visit: Payer: BC Managed Care – PPO

## 2019-06-20 ENCOUNTER — Ambulatory Visit: Payer: BC Managed Care – PPO | Admitting: Hematology & Oncology

## 2019-06-20 ENCOUNTER — Inpatient Hospital Stay: Payer: BC Managed Care – PPO

## 2019-06-20 ENCOUNTER — Encounter: Payer: Self-pay | Admitting: Internal Medicine

## 2019-06-20 ENCOUNTER — Other Ambulatory Visit: Payer: BC Managed Care – PPO

## 2019-06-20 ENCOUNTER — Encounter: Payer: Self-pay | Admitting: *Deleted

## 2019-06-20 ENCOUNTER — Other Ambulatory Visit: Payer: Self-pay

## 2019-06-20 DIAGNOSIS — G9389 Other specified disorders of brain: Secondary | ICD-10-CM | POA: Diagnosis not present

## 2019-06-20 DIAGNOSIS — C7931 Secondary malignant neoplasm of brain: Secondary | ICD-10-CM | POA: Diagnosis not present

## 2019-06-21 ENCOUNTER — Telehealth: Payer: Self-pay | Admitting: Nutrition

## 2019-06-21 ENCOUNTER — Encounter: Payer: BC Managed Care – PPO | Admitting: Nutrition

## 2019-06-21 ENCOUNTER — Ambulatory Visit
Admission: RE | Admit: 2019-06-21 | Discharge: 2019-06-21 | Disposition: A | Discharge status: Home / Self Care | Payer: BC Managed Care – PPO | Source: Ambulatory Visit | Attending: Radiation Oncology | Admitting: Radiation Oncology

## 2019-06-21 ENCOUNTER — Ambulatory Visit: Payer: BC Managed Care – PPO

## 2019-06-21 DIAGNOSIS — C7931 Secondary malignant neoplasm of brain: Secondary | ICD-10-CM | POA: Diagnosis not present

## 2019-06-21 DIAGNOSIS — G9389 Other specified disorders of brain: Secondary | ICD-10-CM | POA: Diagnosis not present

## 2019-06-21 NOTE — Telephone Encounter (Signed)
Telephone follow up completed with patient's husband [name]. Current weight decreased to 116.6 pounds from 132 pounds. Patient has metastatic lung cancer. Patient is doing better the past 2 days. She is eating more solid food and keeping it down. Tolerates shrimp, cheeseburger, eggs, toast, protein shakes and french fries. No vomiting yesterday or today. Pain better on gabapentin. He denies nutrition needs or questions. He appreciated phone call and stated it was great not to feel "all alone out here." Encouraged him to call with nutrition questions or concerns.

## 2019-06-24 ENCOUNTER — Ambulatory Visit: Payer: BC Managed Care – PPO

## 2019-06-24 ENCOUNTER — Ambulatory Visit
Admission: RE | Admit: 2019-06-24 | Discharge: 2019-06-24 | Disposition: A | Discharge status: Home / Self Care | Payer: BC Managed Care – PPO | Source: Ambulatory Visit | Attending: Radiation Oncology | Admitting: Radiation Oncology

## 2019-06-24 ENCOUNTER — Other Ambulatory Visit: Payer: Self-pay

## 2019-06-24 ENCOUNTER — Telehealth: Payer: Self-pay | Admitting: Radiation Oncology

## 2019-06-24 ENCOUNTER — Encounter: Payer: Self-pay | Admitting: Urology

## 2019-06-24 DIAGNOSIS — G9389 Other specified disorders of brain: Secondary | ICD-10-CM | POA: Diagnosis not present

## 2019-06-24 DIAGNOSIS — C7951 Secondary malignant neoplasm of bone: Secondary | ICD-10-CM | POA: Insufficient documentation

## 2019-06-24 DIAGNOSIS — C7931 Secondary malignant neoplasm of brain: Secondary | ICD-10-CM | POA: Diagnosis not present

## 2019-06-24 MED ORDER — SONAFINE EX EMUL
1.0000 "application " | Freq: Once | CUTANEOUS | Status: AC
  Administered 2019-06-24: 1 via TOPICAL

## 2019-06-24 NOTE — Telephone Encounter (Signed)
Received voicemail message today from patient's husband, [name]. He reports that over the weekend his wife began to have low lumbar pain again. He questions if further treatment should be pursued in that area. Also, he reports in the upper most part of her back there is "a red spot." Patient scheduled for her final radiation treatment today at [facility] requested to speak with Dr. Tammi Klippel about this over the phone or when he brings his wife for treatment at 1 pm today.

## 2019-06-24 NOTE — Progress Notes (Signed)
Completed post sim education with patient today oriented patient to staff and routine of clinic provided patient with radiation therapy and you handbook then reviewed pertinent information educated patient reference potential side effects and management such as fatigue and pain answered all patient questions to the best of my ability.  Encouraged him to call with future needs. Patient verbalized understanding teaching. Provided patient with sonafine. Has no known allgeries

## 2019-06-26 ENCOUNTER — Ambulatory Visit
Admission: RE | Admit: 2019-06-26 | Discharge: 2019-06-26 | Disposition: A | Discharge status: Home / Self Care | Payer: BC Managed Care – PPO | Source: Ambulatory Visit | Attending: Radiation Oncology | Admitting: Radiation Oncology

## 2019-06-26 ENCOUNTER — Other Ambulatory Visit: Payer: Self-pay

## 2019-06-26 ENCOUNTER — Telehealth: Payer: Self-pay | Admitting: Hematology & Oncology

## 2019-06-26 DIAGNOSIS — C7951 Secondary malignant neoplasm of bone: Secondary | ICD-10-CM

## 2019-06-26 DIAGNOSIS — C7931 Secondary malignant neoplasm of brain: Secondary | ICD-10-CM | POA: Diagnosis not present

## 2019-06-26 DIAGNOSIS — G9389 Other specified disorders of brain: Secondary | ICD-10-CM | POA: Diagnosis not present

## 2019-06-27 ENCOUNTER — Other Ambulatory Visit: Payer: BC Managed Care – PPO

## 2019-06-27 ENCOUNTER — Ambulatory Visit: Payer: BC Managed Care – PPO

## 2019-06-27 ENCOUNTER — Ambulatory Visit: Payer: BC Managed Care – PPO | Admitting: Hematology & Oncology

## 2019-06-27 DIAGNOSIS — G9389 Other specified disorders of brain: Secondary | ICD-10-CM | POA: Diagnosis not present

## 2019-06-27 DIAGNOSIS — C7931 Secondary malignant neoplasm of brain: Secondary | ICD-10-CM | POA: Diagnosis not present

## 2019-06-27 DIAGNOSIS — C7951 Secondary malignant neoplasm of bone: Secondary | ICD-10-CM | POA: Diagnosis not present

## 2019-06-28 ENCOUNTER — Ambulatory Visit
Admission: RE | Admit: 2019-06-28 | Discharge: 2019-06-28 | Disposition: A | Discharge status: Home / Self Care | Payer: BC Managed Care – PPO | Source: Ambulatory Visit | Attending: Radiation Oncology | Admitting: Radiation Oncology

## 2019-06-28 ENCOUNTER — Encounter: Payer: Self-pay | Admitting: Radiation Oncology

## 2019-06-28 ENCOUNTER — Other Ambulatory Visit: Payer: Self-pay

## 2019-06-28 DIAGNOSIS — G9389 Other specified disorders of brain: Secondary | ICD-10-CM | POA: Diagnosis not present

## 2019-06-28 DIAGNOSIS — C7951 Secondary malignant neoplasm of bone: Secondary | ICD-10-CM | POA: Diagnosis not present

## 2019-06-28 DIAGNOSIS — C7931 Secondary malignant neoplasm of brain: Secondary | ICD-10-CM | POA: Diagnosis not present

## 2019-06-29 NOTE — Progress Notes (Signed)
  Radiation Oncology         [phone] ________________________________  Name: [name] MRN: [id number]  Date: 06/26/2019  DOB: Oct 27, 1955  SIMULATION AND TREATMENT PLANNING NOTE    ICD-10-CM   1. Bone metastases ([place])  C79.51     DIAGNOSIS:  64 y.o. patient with painful L3 lumbar metastasis  NARRATIVE:  The patient was brought to the [facility].  Identity was confirmed.  All relevant records and images related to the planned course of therapy were reviewed.  The patient freely provided informed written consent to proceed with treatment after reviewing the details related to the planned course of therapy. The consent form was witnessed and verified by the simulation staff.  Then, the patient was set-up in a stable reproducible  supine position for radiation therapy.  CT images were obtained.  Surface markings were placed.  The CT images were loaded into the planning software.  Then the target and avoidance structures were contoured including kidneys.  Treatment planning then occurred.  The radiation prescription was entered and confirmed.  Then, I designed and supervised the construction of a total of 3 medically necessary complex treatment devices with VacLoc positioner and 2 MLCs to shield kidneys.  I have requested : 3D Simulation  I have requested a DVH of the following structures: Left Kidney, Right Kidney and target.  PLAN:  The patient will receive 8 Gy in 1 fraction.  ________________________________  Sheral Apley Tammi Klippel, M.D.

## 2019-07-02 ENCOUNTER — Other Ambulatory Visit: Payer: BC Managed Care – PPO

## 2019-07-02 ENCOUNTER — Ambulatory Visit: Payer: BC Managed Care – PPO | Admitting: Hematology & Oncology

## 2019-07-02 ENCOUNTER — Other Ambulatory Visit: Payer: Self-pay | Admitting: Hematology & Oncology

## 2019-07-02 ENCOUNTER — Encounter: Payer: Self-pay | Admitting: Hematology & Oncology

## 2019-07-02 ENCOUNTER — Ambulatory Visit: Payer: BC Managed Care – PPO

## 2019-07-02 DIAGNOSIS — C3491 Malignant neoplasm of unspecified part of right bronchus or lung: Secondary | ICD-10-CM

## 2019-07-02 DIAGNOSIS — C3490 Malignant neoplasm of unspecified part of unspecified bronchus or lung: Secondary | ICD-10-CM

## 2019-07-02 DIAGNOSIS — M25551 Pain in right hip: Secondary | ICD-10-CM

## 2019-07-02 DIAGNOSIS — C7951 Secondary malignant neoplasm of bone: Secondary | ICD-10-CM

## 2019-07-02 DIAGNOSIS — C7931 Secondary malignant neoplasm of brain: Secondary | ICD-10-CM

## 2019-07-03 ENCOUNTER — Other Ambulatory Visit: Payer: Self-pay | Admitting: *Deleted

## 2019-07-03 DIAGNOSIS — M25551 Pain in right hip: Secondary | ICD-10-CM

## 2019-07-03 DIAGNOSIS — C3491 Malignant neoplasm of unspecified part of right bronchus or lung: Secondary | ICD-10-CM

## 2019-07-03 DIAGNOSIS — C7951 Secondary malignant neoplasm of bone: Secondary | ICD-10-CM

## 2019-07-03 DIAGNOSIS — C3490 Malignant neoplasm of unspecified part of unspecified bronchus or lung: Secondary | ICD-10-CM

## 2019-07-03 DIAGNOSIS — C7931 Secondary malignant neoplasm of brain: Secondary | ICD-10-CM

## 2019-07-03 MED ORDER — LIDOCAINE-PRILOCAINE 2.5-2.5 % EX CREA: TOPICAL_CREAM | CUTANEOUS | 3 refills | Status: AC

## 2019-07-03 MED ORDER — MORPHINE SULFATE 15 MG PO TABS: 15.0000 mg | ORAL_TABLET | ORAL | 0 refills | Status: DC | PRN

## 2019-07-03 NOTE — Progress Notes (Signed)
Dr. Marin Olp is planning to give Keytruda only on 3/17. Carboplatin and pemetrexed deleted from this date only per his instructions.

## 2019-07-04 ENCOUNTER — Ambulatory Visit: Payer: BC Managed Care – PPO

## 2019-07-05 ENCOUNTER — Ambulatory Visit: Payer: BC Managed Care – PPO | Admitting: Internal Medicine

## 2019-07-09 ENCOUNTER — Ambulatory Visit: Payer: BC Managed Care – PPO | Admitting: Hematology & Oncology

## 2019-07-09 ENCOUNTER — Ambulatory Visit: Payer: BC Managed Care – PPO

## 2019-07-09 ENCOUNTER — Other Ambulatory Visit: Payer: BC Managed Care – PPO

## 2019-07-09 NOTE — Progress Notes (Signed)
  Radiation Oncology         [phone] ________________________________  Name: [name] MRN: [id number]  Date: 06/28/2019  DOB: Nov 22, 1955  End of Treatment Note  Diagnosis:   64 y.o. patient with painful L3 lumbar metastasis     Indication for treatment:  Palliation       Radiation treatment dates:   06/28/19  Site/dose:   L3 was treated in one fraction to 8 Gy  Beams/energy:   3D planning was used to avoid the kidneys while treating static gantry angles zero and 180.  Narrative: The patient tolerated radiation treatment relatively well.     Plan: The patient has completed radiation treatment. The patient will return to radiation oncology clinic for routine followup in one month. I advised her to call or return sooner if she has any questions or concerns related to her recovery or treatment. ________________________________  Sheral Apley. Tammi Klippel, M.D.

## 2019-07-10 ENCOUNTER — Inpatient Hospital Stay: Payer: BC Managed Care – PPO | Attending: Hematology & Oncology

## 2019-07-10 ENCOUNTER — Encounter: Payer: Self-pay | Admitting: Hematology & Oncology

## 2019-07-10 ENCOUNTER — Inpatient Hospital Stay: Payer: BC Managed Care – PPO

## 2019-07-10 ENCOUNTER — Other Ambulatory Visit: Payer: Self-pay

## 2019-07-10 ENCOUNTER — Encounter: Payer: Self-pay | Admitting: *Deleted

## 2019-07-10 ENCOUNTER — Inpatient Hospital Stay (HOSPITAL_BASED_OUTPATIENT_CLINIC_OR_DEPARTMENT_OTHER): Payer: BC Managed Care – PPO | Admitting: Hematology & Oncology

## 2019-07-10 VITALS — BP 124/70 | HR 80 | Temp 98.5°F | Resp 16 | Wt 114.7 lb

## 2019-07-10 DIAGNOSIS — C3490 Malignant neoplasm of unspecified part of unspecified bronchus or lung: Secondary | ICD-10-CM

## 2019-07-10 DIAGNOSIS — C3411 Malignant neoplasm of upper lobe, right bronchus or lung: Secondary | ICD-10-CM | POA: Diagnosis not present

## 2019-07-10 DIAGNOSIS — Z7901 Long term (current) use of anticoagulants: Secondary | ICD-10-CM | POA: Diagnosis not present

## 2019-07-10 DIAGNOSIS — Z87891 Personal history of nicotine dependence: Secondary | ICD-10-CM | POA: Diagnosis not present

## 2019-07-10 DIAGNOSIS — C3491 Malignant neoplasm of unspecified part of right bronchus or lung: Secondary | ICD-10-CM

## 2019-07-10 DIAGNOSIS — Z5112 Encounter for antineoplastic immunotherapy: Secondary | ICD-10-CM | POA: Diagnosis not present

## 2019-07-10 DIAGNOSIS — R1110 Vomiting, unspecified: Secondary | ICD-10-CM | POA: Insufficient documentation

## 2019-07-10 DIAGNOSIS — C7931 Secondary malignant neoplasm of brain: Secondary | ICD-10-CM | POA: Insufficient documentation

## 2019-07-10 DIAGNOSIS — C7951 Secondary malignant neoplasm of bone: Secondary | ICD-10-CM | POA: Diagnosis not present

## 2019-07-10 DIAGNOSIS — Z79899 Other long term (current) drug therapy: Secondary | ICD-10-CM | POA: Diagnosis not present

## 2019-07-10 DIAGNOSIS — Z923 Personal history of irradiation: Secondary | ICD-10-CM | POA: Diagnosis not present

## 2019-07-10 DIAGNOSIS — Z791 Long term (current) use of non-steroidal anti-inflammatories (NSAID): Secondary | ICD-10-CM | POA: Insufficient documentation

## 2019-07-10 DIAGNOSIS — I2609 Other pulmonary embolism with acute cor pulmonale: Secondary | ICD-10-CM | POA: Diagnosis not present

## 2019-07-10 LAB — CBC WITH DIFFERENTIAL (CANCER CENTER ONLY)
Abs Immature Granulocytes: 0.02 10*3/uL (ref 0.00–0.07)
Basophils Absolute: 0 10*3/uL (ref 0.0–0.1)
Basophils Relative: 1 %
Eosinophils Absolute: 0 10*3/uL (ref 0.0–0.5)
Eosinophils Relative: 0 %
HCT: 35.6 % — ABNORMAL LOW (ref 36.0–46.0)
Hemoglobin: 11.4 g/dL — ABNORMAL LOW (ref 12.0–15.0)
Immature Granulocytes: 0 %
Lymphocytes Relative: 11 %
Lymphs Abs: 0.6 10*3/uL — ABNORMAL LOW (ref 0.7–4.0)
MCH: 31.1 pg (ref 26.0–34.0)
MCHC: 32 g/dL (ref 30.0–36.0)
MCV: 97 fL (ref 80.0–100.0)
Monocytes Absolute: 0.6 10*3/uL (ref 0.1–1.0)
Monocytes Relative: 10 %
Neutro Abs: 4.1 10*3/uL (ref 1.7–7.7)
Neutrophils Relative %: 78 %
Platelet Count: 416 10*3/uL — ABNORMAL HIGH (ref 150–400)
RBC: 3.67 MIL/uL — ABNORMAL LOW (ref 3.87–5.11)
RDW: 13.9 % (ref 11.5–15.5)
WBC Count: 5.3 10*3/uL (ref 4.0–10.5)
nRBC: 0 % (ref 0.0–0.2)

## 2019-07-10 LAB — CMP (CANCER CENTER ONLY)
ALT: 17 U/L (ref 0–44)
AST: 22 U/L (ref 15–41)
Albumin: 4.1 g/dL (ref 3.5–5.0)
Alkaline Phosphatase: 74 U/L (ref 38–126)
Anion gap: 8 (ref 5–15)
BUN: 13 mg/dL (ref 8–23)
CO2: 28 mmol/L (ref 22–32)
Calcium: 9.6 mg/dL (ref 8.9–10.3)
Chloride: 103 mmol/L (ref 98–111)
Creatinine: 0.47 mg/dL (ref 0.44–1.00)
GFR, Est AFR Am: >60 mL/min (ref >60)
GFR, Estimated: >60 mL/min (ref >60)
Glucose, Bld: 100 mg/dL — ABNORMAL HIGH (ref 70–99)
Potassium: 4 mmol/L (ref 3.5–5.1)
Sodium: 139 mmol/L (ref 135–145)
Total Bilirubin: 0.3 mg/dL (ref 0.3–1.2)
Total Protein: 6.7 g/dL (ref 6.5–8.1)

## 2019-07-10 LAB — GUARDANT 360

## 2019-07-10 LAB — TSH: TSH: 1.185 u[IU]/mL (ref 0.308–3.960)

## 2019-07-10 MED ORDER — SODIUM CHLORIDE 0.9 % IV SOLN
Freq: Once | INTRAVENOUS | Status: AC
  Filled 2019-07-10: qty 250

## 2019-07-10 MED ORDER — SODIUM CHLORIDE 0.9% FLUSH
10.0000 mL | INTRAVENOUS | Status: DC | PRN
  Administered 2019-07-10: 13:00:00 10 mL
  Filled 2019-07-10: qty 10

## 2019-07-10 MED ORDER — SODIUM CHLORIDE 0.9 % IV SOLN
200.0000 mg | Freq: Once | INTRAVENOUS | Status: AC
  Administered 2019-07-10: 12:00:00 200 mg via INTRAVENOUS
  Filled 2019-07-10: qty 8

## 2019-07-10 MED ORDER — ONDANSETRON 8 MG PO TBDP: 8.0000 mg | ORAL_TABLET | Freq: Three times a day (TID) | ORAL | 2 refills | Status: DC | PRN

## 2019-07-10 MED ORDER — HEPARIN SOD (PORK) LOCK FLUSH 100 UNIT/ML IV SOLN
500.0000 [IU] | Freq: Once | INTRAVENOUS | Status: AC | PRN
  Administered 2019-07-10: 500 [IU]
  Filled 2019-07-10: qty 5

## 2019-07-10 NOTE — Progress Notes (Signed)
Hematology and Oncology Follow Up Visit  [name] [id number] 1956-03-02 64 y.o. 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  She seems to be getting a little better each time I see her.  We only did pembrolizumab with her last cycle.  She is very much afraid of the chemotherapy.  I just do not think that she is ready for chemotherapy yet.  She finished radiation.  She did well with radiation.  She has had no problems with bleeding.  She is on Lovenox.  There is no problems with right leg swelling.  Pain seems to be doing a little bit better.  She is on 150 mcg of fentanyl now.  She is tolerating this pretty well.  She still takes short acting pain medication 4 times a day.  She has vomiting every morning.  This is when she wakes up.  We try to get her on Sancuso patches but insurance would not pay for this.  I told her to put a lorazepam under her tongue when she wakes up every morning.  This might help.  She has little bit of better strength in the right arm.  She has had no problems with bowels or bladder.  She does take some laxatives.  She has had no cough.  There is no shortness of breath.  She has had no problems with headaches.  Overall, I would say her performance status is ECOG 2.     Medications:  Current Outpatient Medications:  .  dronabinol (MARINOL) 5 MG capsule, Take 1 capsule (5 mg total) by mouth 2 (two) times daily before lunch and supper., Disp: 60 capsule, Rfl: 0 .  enoxaparin (LOVENOX) 100 MG/ML injection, Inject 1 mL (100 mg total) into the skin daily., Disp: 90 mL, Rfl: 3 .  feeding supplement, ENSURE ENLIVE, (ENSURE ENLIVE) LIQD, Take 237 mLs by mouth 2  (two) times daily between meals., Disp: 237 mL, Rfl: 12 .  fentaNYL (DURAGESIC) 75 MCG/HR, Place 2 patches onto the skin every 3 (three) days. (Patient taking differently: Place 1 patch onto the skin every 3 (three) days. ), Disp: 15 patch, Rfl: 0 .  folic acid (FOLVITE) 1 MG tablet, Take 1 tablet (1 mg total) by mouth daily. Start 5-7 days before Alimta chemotherapy. Continue until 21 days after Alimta completed., Disp: 100 tablet, Rfl: 3 .  gabapentin (NEURONTIN) 300 MG capsule, Take 1 capsule (300 mg total) by mouth 3 (three) times daily., Disp: 90 capsule, Rfl: 4 .  granisetron (SANCUSO) 3.1 MG/24HR, Apply to skin starting 24 hours before chemotherapy. Remove after 7 days., Disp: 4 each, Rfl: 4 .  ketorolac (TORADOL) 10 MG tablet, Take 1 tablet (10 mg total) by mouth every 6 (six) hours as needed., Disp: 30 tablet, Rfl: 1 .  lidocaine-prilocaine (EMLA) cream, Apply to affected area once, Disp: 30 g, Rfl: 3 .  lip balm (CARMEX) ointment, Apply topically as needed for lip care., Disp: 7 g, Rfl: 0 .  LORazepam (ATIVAN) 1 MG tablet, Take 1 tablet (1 mg total) by mouth every 6 (six) hours as needed for anxiety., Disp: 30 tablet, Rfl: 1 .  methocarbamol (ROBAXIN) 500 MG tablet, Take 1 tablet (500 mg total) by  mouth every 6 (six) hours as needed for muscle spasms., Disp: 60 tablet, Rfl: 3 .  morphine (MSIR) 15 MG tablet, Take 1 tablet (15 mg total) by mouth every 4 (four) hours as needed for severe pain., Disp: 90 tablet, Rfl: 0 .  Multiple Vitamin (MULTIVITAMIN WITH MINERALS) TABS tablet, Take 1 tablet by mouth daily., Disp: 30 tablet, Rfl: 0 .  ondansetron (ZOFRAN) 8 MG tablet, Take 1 tablet (8 mg total) by mouth 2 (two) times daily as needed (Nausea or vomiting). Start if needed on the third day after chemotherapy., Disp: 30 tablet, Rfl: 1 .  pantoprazole (PROTONIX) 40 MG tablet, Take 1 tablet (40 mg total) by mouth daily with supper., Disp: 30 tablet, Rfl: 2 .  polyvinyl alcohol (LIQUIFILM TEARS) 1.4  % ophthalmic solution, Place 1 drop into both eyes as needed for dry eyes., Disp: , Rfl:  .  promethazine (PHENERGAN) 12.5 MG tablet, TAKE 1-2 TABLETS BY MOUTH EVERY 4 (FOUR) HOURS AS NEEDED FOR REFRACTORY NAUSEA / VOMITING., Disp: 30 tablet, Rfl: 3 .  senna-docusate (SENOKOT-S) 8.6-50 MG tablet, Take 1 tablet by mouth 2 (two) times daily., Disp:  , Rfl:  .  acetaminophen (TYLENOL) 325 MG tablet, Take 2 tablets (650 mg total) by mouth every 6 (six) hours. (Patient not taking: Reported on 07/10/2019), Disp:  , Rfl:  .  lactulose (CHRONULAC) 10 GM/15ML solution, Take 30 mLs (20 g total) by mouth 2 (two) times daily. (Patient not taking: Reported on 07/10/2019), Disp: 1000 mL, Rfl: 4 .  loratadine (CLARITIN) 10 MG tablet, Take 1 tablet (10 mg total) by mouth daily. (Patient not taking: Reported on 07/10/2019), Disp: 30 tablet, Rfl: 0 .  NARCAN 4 MG/0.1ML LIQD nasal spray kit, Place 1 spray into the nose once as needed (overdose). , Disp: , Rfl:  .  oxymetazoline (AFRIN) 0.05 % nasal spray, Place 1 spray into both nostrils 2 (two) times daily as needed for congestion., Disp: , Rfl:   Allergies:  No Known Allergies  Past Medical History, Surgical history, Social history, and Family History were reviewed and updated.  Review of Systems: Review of Systems  Constitutional: Negative.   HENT:  Negative.   Eyes: Negative.   Respiratory: Negative.   Cardiovascular: Negative.   Gastrointestinal: Negative.   Endocrine: Negative.   Genitourinary: Negative.    Musculoskeletal: Positive for arthralgias and back pain.  Skin: Negative.   Neurological: Positive for extremity weakness.  Hematological: Negative.   Psychiatric/Behavioral: Negative.     Physical Exam: Fairly well-developed well-nourished white female in mild distress secondary to pain.  Vital signs show temperature of 97.3.  Pulse 93.  Blood pressure 104/81.  Weight 232 pounds.  Head neck exam shows no ocular or oral lesions.  There are no  palpable cervical or supraclavicular lymph nodes.  Lungs are clear bilaterally.  Cardiac exam regular rate and rhythm with no murmurs, rubs or bruits.  Abdomen is soft.  Bowel sounds are slightly decreased.  She has no fluid wave.  There is no palpable liver or spleen tip.  Back exam shows some tenderness in the left scapula.  This is in the inferior portion of the scapula.  There is no tenderness over the actual spine or hips.  Extremities shows weakness in the right arm.  This is chronic.  Neurological exam shows the weakness in the right arm.  Skin exam shows no rashes, ecchymoses or petechia.    weight is 114 lb 11.2 oz (52 kg). Her temporal temperature is  98.5 F (36.9 C). Her blood pressure is 124/70 and her pulse is 80. Her respiration is 16 and oxygen saturation is 100%.   Wt Readings from Last 3 Encounters:  07/10/19 114 lb 11.2 oz (52 kg)  06/18/19 116 lb 9.6 oz (52.9 kg)  06/07/19 120 lb 3.2 oz (54.5 kg)    Physical Exam   Lab Results  Component Value Date   WBC 5.3 07/10/2019   HGB 11.4 (L) 07/10/2019   HCT 35.6 (L) 07/10/2019   MCV 97.0 07/10/2019   PLT 416 (H) 07/10/2019     Chemistry      Component Value Date/Time   NA 139 07/10/2019 1012   K 4.0 07/10/2019 1012   CL 103 07/10/2019 1012   CO2 28 07/10/2019 1012   BUN 13 07/10/2019 1012   CREATININE 0.47 07/10/2019 1012      Component Value Date/Time   CALCIUM 9.6 07/10/2019 1012   ALKPHOS 74 07/10/2019 1012   AST 22 07/10/2019 1012   ALT 17 07/10/2019 1012   BILITOT 0.3 07/10/2019 1012       Impression and Plan: Ms. Northcraft is a 64 year old yo female.  She is or was a smoker.  She now has metastatic adenocarcinoma of the right lung.  I feel bad that insurance would not help with the Sancuso patch.  We will have to try to work around this.  Maybe the sublingual Ativan will help.  We will have to set her up with scans after this cycle.  I think this is going be necessary so we can see we will count a  response that she has had.  The fact that her alkaline phosphatase has dropped is hopefully a good measure of response.  Again, quality of life is what we are most focused on.  She just not going to take chemotherapy as she fears that her quality of life is going to be impacted in a negative way.  I will plan to get her back to see [place] in about 4 weeks.  I think this is reasonable.  Hopefully, her vomiting will improve.     Volanda Napoleon, MD 3/17/202111:10 AM

## 2019-07-10 NOTE — Patient Instructions (Signed)
[facility] Discharge Instructions for Patients Receiving Chemotherapy  Today you received the following chemotherapy agents Keytruda  To help prevent nausea and vomiting after your treatment, we encourage you to take your nausea medication as prescribed by MD.   If you develop nausea and vomiting that is not controlled by your nausea medication, call the clinic.   BELOW ARE SYMPTOMS THAT SHOULD BE REPORTED IMMEDIATELY:  *FEVER GREATER THAN 100.5 F  *CHILLS WITH OR WITHOUT FEVER  NAUSEA AND VOMITING THAT IS NOT CONTROLLED WITH YOUR NAUSEA MEDICATION  *UNUSUAL SHORTNESS OF BREATH  *UNUSUAL BRUISING OR BLEEDING  TENDERNESS IN MOUTH AND THROAT WITH OR WITHOUT PRESENCE OF ULCERS  *URINARY PROBLEMS  *BOWEL PROBLEMS  UNUSUAL RASH Items with * indicate a potential emergency and should be followed up as soon as possible.  Feel free to call the clinic should you have any questions or concerns. The clinic phone number is [phone].  Please show the [facility] at check-in to the Emergency Department and triage nurse.

## 2019-07-11 ENCOUNTER — Ambulatory Visit: Payer: BC Managed Care – PPO

## 2019-07-11 LAB — T4: T4, Total: 9.6 ug/dL (ref 4.5–12.0)

## 2019-07-12 ENCOUNTER — Inpatient Hospital Stay: Payer: BC Managed Care – PPO

## 2019-07-16 ENCOUNTER — Encounter: Payer: Self-pay | Admitting: Hematology & Oncology

## 2019-07-16 ENCOUNTER — Other Ambulatory Visit: Payer: Self-pay | Admitting: Hematology & Oncology

## 2019-07-16 DIAGNOSIS — C3490 Malignant neoplasm of unspecified part of unspecified bronchus or lung: Secondary | ICD-10-CM

## 2019-07-16 DIAGNOSIS — C7931 Secondary malignant neoplasm of brain: Secondary | ICD-10-CM

## 2019-07-16 DIAGNOSIS — I2699 Other pulmonary embolism without acute cor pulmonale: Secondary | ICD-10-CM

## 2019-07-16 DIAGNOSIS — C3491 Malignant neoplasm of unspecified part of right bronchus or lung: Secondary | ICD-10-CM

## 2019-07-17 ENCOUNTER — Other Ambulatory Visit: Payer: Self-pay | Admitting: *Deleted

## 2019-07-17 MED ORDER — ENOXAPARIN SODIUM 100 MG/ML ~~LOC~~ SOLN: 100.0000 mg | SUBCUTANEOUS | 3 refills | Status: DC

## 2019-07-17 MED ORDER — LORAZEPAM 1 MG PO TABS: 1.0000 mg | ORAL_TABLET | Freq: Four times a day (QID) | ORAL | 1 refills | Status: DC | PRN

## 2019-07-17 MED ORDER — KETOROLAC TROMETHAMINE 10 MG PO TABS: 10.0000 mg | ORAL_TABLET | Freq: Four times a day (QID) | ORAL | 1 refills | Status: DC | PRN

## 2019-07-17 MED ORDER — ONDANSETRON 8 MG PO TBDP: 8.0000 mg | ORAL_TABLET | Freq: Three times a day (TID) | ORAL | 2 refills | Status: AC | PRN

## 2019-07-17 MED ORDER — FOLIC ACID 1 MG PO TABS: 1.0000 mg | ORAL_TABLET | Freq: Every day | ORAL | 3 refills | Status: AC

## 2019-07-17 MED ORDER — DRONABINOL 5 MG PO CAPS: 5.0000 mg | ORAL_CAPSULE | Freq: Two times a day (BID) | ORAL | 0 refills | Status: DC

## 2019-07-21 ENCOUNTER — Encounter: Payer: Self-pay | Admitting: Hematology & Oncology

## 2019-07-22 ENCOUNTER — Encounter: Payer: Self-pay | Admitting: Hematology & Oncology

## 2019-07-22 ENCOUNTER — Telehealth: Payer: Self-pay | Admitting: *Deleted

## 2019-07-22 NOTE — Telephone Encounter (Signed)
Call placed to patient's husband regarding MyChart message. [name] states that pt.'s pain is under control and that she is keeping right hand elevated.  Appts reviewed with [name] for 08/07/19.  Eric requests that CT angio and doppler be changed to the day before labs/dr e and treatment so that pt will be able to tolerate all of the appts better d/t fatigue. Eric appreciative of call and has no further questions at this time.  Message sent to scheduling.

## 2019-07-23 ENCOUNTER — Telehealth: Payer: Self-pay | Admitting: Hematology & Oncology

## 2019-07-23 NOTE — Telephone Encounter (Signed)
Returned call to patient from 3/29.  He was requesting to have some of her appointments moved from 4/14 to 4/13 to help w/ her having to be out all day on 4/14.  Appointments moved as requested-along w/ CT Angio & [place].  I left a detailed message on her husbands voicemail.

## 2019-07-29 ENCOUNTER — Encounter: Payer: Self-pay | Admitting: Hematology & Oncology

## 2019-07-30 ENCOUNTER — Ambulatory Visit (HOSPITAL_COMMUNITY)
Admission: RE | Admit: 2019-07-30 | Discharge: 2019-07-30 | Disposition: A | Discharge status: Home / Self Care | Payer: BC Managed Care – PPO | Source: Ambulatory Visit | Attending: Hematology & Oncology | Admitting: Hematology & Oncology

## 2019-07-30 ENCOUNTER — Other Ambulatory Visit: Payer: Self-pay

## 2019-07-30 DIAGNOSIS — C7951 Secondary malignant neoplasm of bone: Secondary | ICD-10-CM | POA: Diagnosis not present

## 2019-07-30 DIAGNOSIS — C3490 Malignant neoplasm of unspecified part of unspecified bronchus or lung: Secondary | ICD-10-CM | POA: Insufficient documentation

## 2019-07-30 DIAGNOSIS — C7931 Secondary malignant neoplasm of brain: Secondary | ICD-10-CM | POA: Insufficient documentation

## 2019-07-30 LAB — GLUCOSE, CAPILLARY: Glucose-Capillary: 87 mg/dL (ref 70–99)

## 2019-07-30 MED ORDER — FLUDEOXYGLUCOSE F - 18 (FDG) INJECTION
5.6900 | Freq: Once | INTRAVENOUS | Status: AC | PRN
  Administered 2019-07-30: 5.69 via INTRAVENOUS

## 2019-07-31 ENCOUNTER — Ambulatory Visit: Payer: Self-pay | Admitting: Urology

## 2019-07-31 ENCOUNTER — Telehealth: Payer: Self-pay | Admitting: *Deleted

## 2019-07-31 ENCOUNTER — Ambulatory Visit: Admission: RE | Admit: 2019-07-31 | Payer: BC Managed Care – PPO | Source: Ambulatory Visit | Admitting: Urology

## 2019-07-31 NOTE — Telephone Encounter (Signed)
CALLED PATIENT TO INFORM THAT PROVIDER WILL CALL HER ON 08-01-19 @ 2 PM FOR FU, SPOKE WITH PATIENT AND SHE AGREED TO THIS

## 2019-07-31 NOTE — Progress Notes (Signed)
Pharmacist Chemotherapy Monitoring - Follow Up Assessment    I verify that I have reviewed each item in the below checklist:  . Regimen for the patient is scheduled for the appropriate day and plan matches scheduled date. Marland Kitchen Appropriate non-routine labs are ordered dependent on drug ordered. . If applicable, additional medications reviewed and ordered per protocol based on lifetime cumulative doses and/or treatment regimen.   Plan for follow-up and/or issues identified: No . I-vent associated with next due treatment: No . MD and/or nursing notified: No  [name] [facility] 07/31/2019 8:45 AM

## 2019-07-31 NOTE — Progress Notes (Signed)
Radiation Oncology         [phone] ________________________________  Name: [name] MRN: [id number]  Date: 08/01/2019  DOB: [birth year]  Post Treatment Note  CC: [name], MD  [name], MD  Diagnosis:   64 y.o. 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  Interval Since Last Radiation:  4.5 weeks   06/28/19:   L3 was treated to 8 Gy in a single fraction   06/11/19 - 06/24/19:  The chest/mediastinum were treated to a dose of 30 Gy in 10 fractions of 3 Gy each.  05/14/19 and 05/16/19:  1.  The Left Scapular lesion and T5 transverse process were treated to 8 Gy in one fraction  2.  The left anterior 6th rib metastasis was treated to 8 Gy in one fraction   05/02/19 1. The left scapula was treated to 3 Gy in a single fraction of a planned 10 fractions 2. The left ribs were treated to 3 Gy in a single fraction of a planned 10 fractions  Narrative:  I spoke with the patient to conduct her routine scheduled 1 month follow up visit via telephone to spare the patient unnecessary potential exposure in the healthcare setting during the current COVID-19 pandemic.  The patient was notified in advance and gave permission to proceed with this visit format. She tolerated her most recent radiotherapy to a painful site of metastasis at L3 very well and did not experience any negative effects.        In summary, she initially presented to the ED on 04/01/19 with progressive right sided weakness in the upper and lower extremities and was found to have a posterior frontal brain metastasis. Her systemic work up revealed a 3.6 cm spiculated mass in the medial right upper lobe lung, compatible with primary bronchogenic neoplasm.  Additionally, there were multiple bilateral pulmonary nodules, associated thoracic nodal metastases and multiple bone lesions. She underwent craniotomy and resection of the posterior frontal brain tumor for both therapeutic and  diagnostic purposes with Dr. Annette Stable on 04/05/2019, and pathology confirmed adenocarcinoma consistent with metastatic lung adenocarcinoma. No residual enhancement was seen on postop brain MRI. Her pain delayed her planned postop radiotherapy to the brain so ultimately, she elected not to proceed with the postop adjuvant brain radiation. She proceeded with chemotherapy though this resulted in pancytopenia which led to a hospitalization. While rehabilitating, she continued to have significant bone pain in the left scapula and left anterior 6th rib. She was going to initially receive 10 fractions for her bone pain, but ultimately her treatment was converted to a hypofractionated course due to inability to tolerate daily treatments due to poorly controlled pain. Once her pain was better controlled, she did return for additional palliative radiotherapy to the sites of bony pain which did provide excellent pain relief. PET imaging on 05/06/19 revealed multiple sites of disease in the bony skeleton including the right pelvis, lumbar and thoracic spine, as well as nodes in the chest and supraclavicular region. She also had faint hypermetabolism along the right glenoid region and multiple suspicious lesions in the ribs. She proceeded with a 2 week course of palliative radiotherapy to the adenopathy in the chest and neck/supraclavicular region, completed on 06/24/2019 and tolerated well.  She was able to resume systemic therapy with pembrolizumab only, under the care and direction of Dr. Marin Olp shortly after completion of radiation but developed severe low back pain which was poorly controlled with oral pain medications  so she elected to proceed with a single fraction of palliative radiotherapy to a known site of metastatic disease at L3 which correlated with her pain/symptoms.   This was completed on 06/28/19 and she tolerated this well.  She has decided not to resume chemotherapy and instead has continued with the Pembrolizumab  immunotherapy only. A repeat PET scan was performed on 07/30/19 and shows a mixed response to therapy with decreased size/hypermetabolism of pulmonary nodules and nodes, particularly in the thoracic and less so lower cervical/abdominal. There was a mixed response response of osseous metastasis with some lesions appearing to have undergone interval healing and decreased hypermetabolism, while other lesions are new/increasingly hypermetabolic in the lumbar spine at L1 and L5.   Her next scheduled follow up with Dr. Marin Olp is on 08/07/19.                On review of systems, the patient states that she is doing fairly well in general.  She denies headaches, changes in auditory or visual acuity, dizziness, difficulty with speech or seizure activity. Her pain is much better controlled though she still relies on pain medications around the clock.  The low back and left shoulder pain had improved significantly following XRT but over the past 2-3 weeks, she has noted pain in the right shoulder and throughout her spine.  She denies any focal site of pain in the spine and denies any associated bowel/bladder changes, paraesthesias or focal weakness in the lower extremities. She reports that the pain is tolerable with her current pain regimen. She has regained some function in the right upper extremity but not as much as she would like and/or hoped.  The right lower extremity weakness has resolved completely. She has had some swelling in her ankles and in the right upper extremity and is scheduled for doppler U/S on 08/06/19 to r/o recurrent DVT despite being on anticoagulation. She is also scheduled for CT Angio Chest on 08/06/19 and will follow up with Dr. Marin Olp thereafter to review results and further treatment recommendations.  ALLERGIES:  has No Known Allergies.  Meds: Current Outpatient Medications  Medication Sig Dispense Refill  . acetaminophen (TYLENOL) 325 MG tablet Take 2 tablets (650 mg total) by mouth every  6 (six) hours. (Patient not taking: Reported on 07/10/2019)    . dronabinol (MARINOL) 5 MG capsule Take 1 capsule (5 mg total) by mouth 2 (two) times daily before lunch and supper. 60 capsule 0  . enoxaparin (LOVENOX) 100 MG/ML injection Inject 1 mL (100 mg total) into the skin daily. 90 mL 3  . feeding supplement, ENSURE ENLIVE, (ENSURE ENLIVE) LIQD Take 237 mLs by mouth 2 (two) times daily between meals. 237 mL 12  . fentaNYL (DURAGESIC) 75 MCG/HR Place 2 patches onto the skin every 3 (three) days. (Patient taking differently: Place 1 patch onto the skin every 3 (three) days. ) 15 patch 0  . folic acid (FOLVITE) 1 MG tablet Take 1 tablet (1 mg total) by mouth daily. Start 5-7 days before Alimta chemotherapy. Continue until 21 days after Alimta completed. 100 tablet 3  . gabapentin (NEURONTIN) 300 MG capsule Take 1 capsule (300 mg total) by mouth 3 (three) times daily. 90 capsule 4  . granisetron (SANCUSO) 3.1 MG/24HR Apply to skin starting 24 hours before chemotherapy. Remove after 7 days. 4 each 4  . ketorolac (TORADOL) 10 MG tablet Take 1 tablet (10 mg total) by mouth every 6 (six) hours as needed. 30 tablet 1  .  lactulose (CHRONULAC) 10 GM/15ML solution Take 30 mLs (20 g total) by mouth 2 (two) times daily. (Patient not taking: Reported on 07/10/2019) 1000 mL 4  . lidocaine-prilocaine (EMLA) cream Apply to affected area once 30 g 3  . lip balm (CARMEX) ointment Apply topically as needed for lip care. 7 g 0  . loratadine (CLARITIN) 10 MG tablet Take 1 tablet (10 mg total) by mouth daily. (Patient not taking: Reported on 07/10/2019) 30 tablet 0  . LORazepam (ATIVAN) 1 MG tablet Take 1 tablet (1 mg total) by mouth every 6 (six) hours as needed for anxiety. 30 tablet 1  . methocarbamol (ROBAXIN) 500 MG tablet Take 1 tablet (500 mg total) by mouth every 6 (six) hours as needed for muscle spasms. 60 tablet 3  . morphine (MSIR) 15 MG tablet Take 1 tablet (15 mg total) by mouth every 4 (four) hours as  needed for severe pain. 90 tablet 0  . Multiple Vitamin (MULTIVITAMIN WITH MINERALS) TABS tablet Take 1 tablet by mouth daily. 30 tablet 0  . NARCAN 4 MG/0.1ML LIQD nasal spray kit Place 1 spray into the nose once as needed (overdose).     . ondansetron (ZOFRAN ODT) 8 MG disintegrating tablet Take 1 tablet (8 mg total) by mouth every 8 (eight) hours as needed for nausea or vomiting. 20 tablet 2  . ondansetron (ZOFRAN) 8 MG tablet Take 1 tablet (8 mg total) by mouth 2 (two) times daily as needed (Nausea or vomiting). Start if needed on the third day after chemotherapy. 30 tablet 1  . oxymetazoline (AFRIN) 0.05 % nasal spray Place 1 spray into both nostrils 2 (two) times daily as needed for congestion.    . pantoprazole (PROTONIX) 40 MG tablet Take 1 tablet (40 mg total) by mouth daily with supper. 30 tablet 2  . polyvinyl alcohol (LIQUIFILM TEARS) 1.4 % ophthalmic solution Place 1 drop into both eyes as needed for dry eyes.    . promethazine (PHENERGAN) 12.5 MG tablet TAKE 1-2 TABLETS BY MOUTH EVERY 4 (FOUR) HOURS AS NEEDED FOR REFRACTORY NAUSEA / VOMITING. 30 tablet 3  . senna-docusate (SENOKOT-S) 8.6-50 MG tablet Take 1 tablet by mouth 2 (two) times daily.     No current facility-administered medications for this visit.    Physical Findings:  vitals were not taken for this visit.   /Unable to assess due to telephone follow up visit format.  Lab Findings: Lab Results  Component Value Date   WBC 5.3 07/10/2019   HGB 11.4 (L) 07/10/2019   HCT 35.6 (L) 07/10/2019   MCV 97.0 07/10/2019   PLT 416 (H) 07/10/2019     Radiographic Findings: NM PET Image Restag (PS) Skull Base To Thigh  Result Date: 07/30/2019 CLINICAL DATA:  Subsequent treatment strategy for restaging of lung cancer with brain metastasis. Metastatic non-small-cell carcinoma. EXAM: NUCLEAR MEDICINE PET SKULL BASE TO THIGH TECHNIQUE: 5.7 mCi F-18 FDG was injected intravenously. Full-ring PET imaging was performed from the skull  base to thigh after the radiotracer. CT data was obtained and used for attenuation correction and anatomic localization. Fasting blood glucose: 87 mg/dl COMPARISON:  05/03/2019 abdominopelvic CT. 05/03/2019 chest CT. Most recent PET of 05/06/2019. FINDINGS: Mediastinal blood pool activity: SUV max 2.3 Liver activity: SUV max NA NECK: Left supraclavicular node measures 1.0 cm and a S.U.V. max of 17.9 today on 48/4. Compare 1.2 cm and a S.U.V. max of 18.0 on the prior exam (when remeasured). Low right jugular/supraclavicular node measures 1.0 cm and a  S.U.V. max of 5.6 today versus 2.2 cm and a S.U.V. max of 19.5 on the prior. Incidental CT findings: Bilateral carotid atherosclerosis. CHEST: Improvement in mediastinal adenopathy. Right paratracheal node measures 2.1 cm and a S.U.V. max of 3.0 on 58/4 versus 3.8 cm and a S.U.V. max of 22.5 on the prior. An index central left upper lobe pulmonary nodule measures 1.9 cm and a S.U.V. max of 17.8 on 30/8. Compare 2.0 cm and a S.U.V. max of 15.9 on the prior. Right apical nodule measures 1.0 cm and a S.U.V. max of 3.8 on 14/8. Compare 2.0 cm and a S.U.V. max of 13.7 on the prior exam (when remeasured). Incidental CT findings: Emphysema. Aortic and coronary artery atherosclerosis. Right Port-A-Cath tip high right atrium. Resolved right-sided pleural effusion. Trace left pleural fluid remains. ABDOMEN/PELVIS: Left periaortic node measures 8 mm and a S.U.V. max of 6.7 on 119/4. Compare 1.0 cm and a S.U.V. max of 18.6 on the prior exam. Incidental CT findings: Normal adrenal glands. High right hepatic lobe low-density lesion is similar, likely a cyst. Aortic atherosclerosis. Colonic stool burden suggests constipation. SKELETON: Left anterior sixth rib lesion is significantly decreased in size and hypermetabolism. Measures a S.U.V. max of 4.4 on 94/4. At the site of a 3.9 x 5.1 cm soft tissue mass which measured a S.U.V. max of 21.9 on the prior. New or significantly increased  L1 vertebral lesion with hypermetabolism at a S.U.V. max of 11.8. Lateral left transverse process L5 hypermetabolic lesion measures a S.U.V. max of 8.1 today and is new. An adjacent left L5 vertebral body newly sclerotic lesion measures 1.1 cm and a S.U.V. max of 2.1. This measures a S.U.V. max of 11.6 on the prior. Incidental CT findings: Decreased hypermetabolism and heterogeneous density within the right acetabulum. IMPRESSION: 1. Response to therapy of soft tissue disease, as evidenced by decreased size/hypermetabolism of pulmonary nodules and nodes (thoracic and less so lower cervical/abdominal), as detailed above. 2. Mixed response of osseous metastasis. Some lesions have undergone interval healing and decreased hypermetabolism, while other lesions are new/increasingly hypermetabolic. 3. Resolved right and significantly decreased left-sided pleural effusion. 4. Aortic atherosclerosis (ICD10-I70.0), coronary artery atherosclerosis and emphysema (ICD10-J43.9). Electronically Signed   By: Abigail Miyamoto M.D.   On: 07/30/2019 14:24    Impression/Plan: 1. 64 y.o. 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 She has recovered well from the effects of her recent radiotherapy and feels that her pain is overall manageable at this point.  She knows to contact us if she develops any further focal sites of bony pain for consideration of further palliative radiotherapy.  She has not had any further imaging of her brain since shortly after her surgical resection and was unable to tolerate adjuvant brain radiation.  We discussed the recommendation to proceed with follow-up MRI brain to reassess the surgical resection cavity and monitor for disease recurrence and/or progression.  She and her husband appear to have a good understanding of her disease and our recommendations and are in agreement with the stated plan.  We will move forward with coordinating an MRI brain to be  completed in the next 1 to 2 weeks and I will plan to reconnect with them by phone once the results are available and we will discuss further treatment/follow-up at that time.  She will also continue in routine follow-up under the care and direction of Dr. Marin Olp with her next visit scheduled for 08/07/2019.  They know to call  at anytime with any further questions or concerns in the interim.    Nicholos Johns, PA-C

## 2019-08-01 ENCOUNTER — Ambulatory Visit
Admission: RE | Admit: 2019-08-01 | Discharge: 2019-08-01 | Disposition: A | Discharge status: Home / Self Care | Payer: BC Managed Care – PPO | Source: Ambulatory Visit | Attending: Radiation Oncology | Admitting: Radiation Oncology

## 2019-08-01 ENCOUNTER — Encounter: Payer: Self-pay | Admitting: Urology

## 2019-08-01 ENCOUNTER — Other Ambulatory Visit: Payer: Self-pay

## 2019-08-01 DIAGNOSIS — C3491 Malignant neoplasm of unspecified part of right bronchus or lung: Secondary | ICD-10-CM

## 2019-08-01 DIAGNOSIS — C3490 Malignant neoplasm of unspecified part of unspecified bronchus or lung: Secondary | ICD-10-CM

## 2019-08-01 DIAGNOSIS — C7951 Secondary malignant neoplasm of bone: Secondary | ICD-10-CM | POA: Insufficient documentation

## 2019-08-01 DIAGNOSIS — C7949 Secondary malignant neoplasm of other parts of nervous system: Secondary | ICD-10-CM | POA: Insufficient documentation

## 2019-08-01 DIAGNOSIS — C7931 Secondary malignant neoplasm of brain: Secondary | ICD-10-CM

## 2019-08-01 DIAGNOSIS — G9389 Other specified disorders of brain: Secondary | ICD-10-CM | POA: Insufficient documentation

## 2019-08-02 ENCOUNTER — Other Ambulatory Visit: Payer: Self-pay | Admitting: Hematology & Oncology

## 2019-08-02 ENCOUNTER — Other Ambulatory Visit: Payer: Self-pay | Admitting: Radiation Therapy

## 2019-08-02 ENCOUNTER — Telehealth: Payer: Self-pay | Admitting: Radiation Therapy

## 2019-08-02 DIAGNOSIS — M25551 Pain in right hip: Secondary | ICD-10-CM

## 2019-08-02 DIAGNOSIS — C3490 Malignant neoplasm of unspecified part of unspecified bronchus or lung: Secondary | ICD-10-CM

## 2019-08-02 DIAGNOSIS — C7931 Secondary malignant neoplasm of brain: Secondary | ICD-10-CM

## 2019-08-02 DIAGNOSIS — C3491 Malignant neoplasm of unspecified part of right bronchus or lung: Secondary | ICD-10-CM

## 2019-08-02 DIAGNOSIS — C7951 Secondary malignant neoplasm of bone: Secondary | ICD-10-CM

## 2019-08-02 MED ORDER — MORPHINE SULFATE 15 MG PO TABS: 15.0000 mg | ORAL_TABLET | ORAL | 0 refills | Status: DC | PRN

## 2019-08-02 MED ORDER — FENTANYL 75 MCG/HR TD PT72: 2.0000 | MEDICATED_PATCH | TRANSDERMAL | 0 refills | Status: DC

## 2019-08-02 NOTE — Progress Notes (Signed)
Opened in error

## 2019-08-02 NOTE — Telephone Encounter (Signed)
Spoke with [name] about his wife's upcoming brain MRI appointment on 4/21 at [facility]. He was thankful for the call and knows to have her there 20 min earlier for check in .    [name] R.T.(R)(T) Radiation Special Procedures Navigator

## 2019-08-06 ENCOUNTER — Inpatient Hospital Stay: Payer: BC Managed Care – PPO

## 2019-08-06 ENCOUNTER — Other Ambulatory Visit: Payer: Self-pay

## 2019-08-06 ENCOUNTER — Ambulatory Visit (HOSPITAL_BASED_OUTPATIENT_CLINIC_OR_DEPARTMENT_OTHER)
Admission: RE | Admit: 2019-08-06 | Discharge: 2019-08-06 | Disposition: A | Discharge status: Home / Self Care | Payer: BC Managed Care – PPO | Source: Ambulatory Visit | Attending: Hematology & Oncology | Admitting: Hematology & Oncology

## 2019-08-06 ENCOUNTER — Inpatient Hospital Stay: Payer: BC Managed Care – PPO | Attending: Hematology & Oncology

## 2019-08-06 VITALS — BP 131/84 | HR 76 | Temp 97.1°F | Resp 16

## 2019-08-06 DIAGNOSIS — C3490 Malignant neoplasm of unspecified part of unspecified bronchus or lung: Secondary | ICD-10-CM

## 2019-08-06 DIAGNOSIS — C7931 Secondary malignant neoplasm of brain: Secondary | ICD-10-CM | POA: Diagnosis not present

## 2019-08-06 DIAGNOSIS — C3491 Malignant neoplasm of unspecified part of right bronchus or lung: Secondary | ICD-10-CM

## 2019-08-06 DIAGNOSIS — I82411 Acute embolism and thrombosis of right femoral vein: Secondary | ICD-10-CM | POA: Insufficient documentation

## 2019-08-06 DIAGNOSIS — Z5112 Encounter for antineoplastic immunotherapy: Secondary | ICD-10-CM | POA: Diagnosis not present

## 2019-08-06 DIAGNOSIS — Z87891 Personal history of nicotine dependence: Secondary | ICD-10-CM | POA: Insufficient documentation

## 2019-08-06 DIAGNOSIS — Z791 Long term (current) use of non-steroidal anti-inflammatories (NSAID): Secondary | ICD-10-CM | POA: Diagnosis not present

## 2019-08-06 DIAGNOSIS — Z7901 Long term (current) use of anticoagulants: Secondary | ICD-10-CM | POA: Diagnosis not present

## 2019-08-06 DIAGNOSIS — Z95828 Presence of other vascular implants and grafts: Secondary | ICD-10-CM

## 2019-08-06 DIAGNOSIS — Z79899 Other long term (current) drug therapy: Secondary | ICD-10-CM | POA: Diagnosis not present

## 2019-08-06 DIAGNOSIS — C3411 Malignant neoplasm of upper lobe, right bronchus or lung: Secondary | ICD-10-CM | POA: Insufficient documentation

## 2019-08-06 DIAGNOSIS — I2609 Other pulmonary embolism with acute cor pulmonale: Secondary | ICD-10-CM

## 2019-08-06 DIAGNOSIS — Z85118 Personal history of other malignant neoplasm of bronchus and lung: Secondary | ICD-10-CM | POA: Diagnosis not present

## 2019-08-06 DIAGNOSIS — C7951 Secondary malignant neoplasm of bone: Secondary | ICD-10-CM | POA: Insufficient documentation

## 2019-08-06 LAB — CMP (CANCER CENTER ONLY)
ALT: 14 U/L (ref 0–44)
AST: 17 U/L (ref 15–41)
Albumin: 4 g/dL (ref 3.5–5.0)
Alkaline Phosphatase: 91 U/L (ref 38–126)
Anion gap: 8 (ref 5–15)
BUN: 13 mg/dL (ref 8–23)
CO2: 27 mmol/L (ref 22–32)
Calcium: 9.8 mg/dL (ref 8.9–10.3)
Chloride: 102 mmol/L (ref 98–111)
Creatinine: 0.56 mg/dL (ref 0.44–1.00)
GFR, Est AFR Am: >60 mL/min (ref >60)
GFR, Estimated: >60 mL/min (ref >60)
Glucose, Bld: 111 mg/dL — ABNORMAL HIGH (ref 70–99)
Potassium: 4.2 mmol/L (ref 3.5–5.1)
Sodium: 137 mmol/L (ref 135–145)
Total Bilirubin: 0.3 mg/dL (ref 0.3–1.2)
Total Protein: 6.6 g/dL (ref 6.5–8.1)

## 2019-08-06 LAB — CBC WITH DIFFERENTIAL (CANCER CENTER ONLY)
Abs Immature Granulocytes: 0.02 10*3/uL (ref 0.00–0.07)
Basophils Absolute: 0 10*3/uL (ref 0.0–0.1)
Basophils Relative: 1 %
Eosinophils Absolute: 0 10*3/uL (ref 0.0–0.5)
Eosinophils Relative: 1 %
HCT: 36.4 % (ref 36.0–46.0)
Hemoglobin: 12.1 g/dL (ref 12.0–15.0)
Immature Granulocytes: 0 %
Lymphocytes Relative: 11 %
Lymphs Abs: 0.7 10*3/uL (ref 0.7–4.0)
MCH: 31.6 pg (ref 26.0–34.0)
MCHC: 33.2 g/dL (ref 30.0–36.0)
MCV: 95 fL (ref 80.0–100.0)
Monocytes Absolute: 0.5 10*3/uL (ref 0.1–1.0)
Monocytes Relative: 7 %
Neutro Abs: 4.9 10*3/uL (ref 1.7–7.7)
Neutrophils Relative %: 80 %
Platelet Count: 403 10*3/uL — ABNORMAL HIGH (ref 150–400)
RBC: 3.83 MIL/uL — ABNORMAL LOW (ref 3.87–5.11)
RDW: 12.3 % (ref 11.5–15.5)
WBC Count: 6.1 10*3/uL (ref 4.0–10.5)
nRBC: 0 % (ref 0.0–0.2)

## 2019-08-06 MED ORDER — HEPARIN SOD (PORK) LOCK FLUSH 100 UNIT/ML IV SOLN
500.0000 [IU] | Freq: Once | INTRAVENOUS | Status: DC
  Filled 2019-08-06: qty 5

## 2019-08-06 MED ORDER — SODIUM CHLORIDE 0.9% FLUSH
10.0000 mL | INTRAVENOUS | Status: DC | PRN
  Administered 2019-08-06: 10 mL via INTRAVENOUS
  Filled 2019-08-06: qty 10

## 2019-08-06 MED ORDER — IOHEXOL 350 MG/ML SOLN
100.0000 mL | Freq: Once | INTRAVENOUS | Status: AC | PRN
  Administered 2019-08-06: 100 mL via INTRAVENOUS

## 2019-08-07 ENCOUNTER — Encounter: Payer: Self-pay | Admitting: *Deleted

## 2019-08-07 ENCOUNTER — Inpatient Hospital Stay: Payer: BC Managed Care – PPO

## 2019-08-07 ENCOUNTER — Other Ambulatory Visit (HOSPITAL_BASED_OUTPATIENT_CLINIC_OR_DEPARTMENT_OTHER): Payer: BC Managed Care – PPO

## 2019-08-07 ENCOUNTER — Other Ambulatory Visit: Payer: BC Managed Care – PPO

## 2019-08-07 ENCOUNTER — Encounter: Payer: Self-pay | Admitting: Hematology & Oncology

## 2019-08-07 ENCOUNTER — Other Ambulatory Visit: Payer: Self-pay | Admitting: *Deleted

## 2019-08-07 ENCOUNTER — Encounter: Payer: Self-pay | Admitting: Pharmacist

## 2019-08-07 ENCOUNTER — Inpatient Hospital Stay (HOSPITAL_BASED_OUTPATIENT_CLINIC_OR_DEPARTMENT_OTHER): Payer: BC Managed Care – PPO | Admitting: Hematology & Oncology

## 2019-08-07 VITALS — BP 121/77 | HR 80 | Temp 97.1°F | Resp 18 | Ht 67.0 in | Wt 114.0 lb

## 2019-08-07 DIAGNOSIS — Z791 Long term (current) use of non-steroidal anti-inflammatories (NSAID): Secondary | ICD-10-CM | POA: Diagnosis not present

## 2019-08-07 DIAGNOSIS — C7951 Secondary malignant neoplasm of bone: Secondary | ICD-10-CM | POA: Diagnosis not present

## 2019-08-07 DIAGNOSIS — Z5112 Encounter for antineoplastic immunotherapy: Secondary | ICD-10-CM | POA: Diagnosis not present

## 2019-08-07 DIAGNOSIS — I82411 Acute embolism and thrombosis of right femoral vein: Secondary | ICD-10-CM | POA: Diagnosis not present

## 2019-08-07 DIAGNOSIS — Z79899 Other long term (current) drug therapy: Secondary | ICD-10-CM | POA: Diagnosis not present

## 2019-08-07 DIAGNOSIS — C3491 Malignant neoplasm of unspecified part of right bronchus or lung: Secondary | ICD-10-CM | POA: Diagnosis not present

## 2019-08-07 DIAGNOSIS — C3411 Malignant neoplasm of upper lobe, right bronchus or lung: Secondary | ICD-10-CM | POA: Diagnosis not present

## 2019-08-07 DIAGNOSIS — C7931 Secondary malignant neoplasm of brain: Secondary | ICD-10-CM

## 2019-08-07 DIAGNOSIS — Z7901 Long term (current) use of anticoagulants: Secondary | ICD-10-CM | POA: Diagnosis not present

## 2019-08-07 DIAGNOSIS — C3490 Malignant neoplasm of unspecified part of unspecified bronchus or lung: Secondary | ICD-10-CM

## 2019-08-07 DIAGNOSIS — Z87891 Personal history of nicotine dependence: Secondary | ICD-10-CM | POA: Diagnosis not present

## 2019-08-07 LAB — TSH: TSH: 0.548 u[IU]/mL (ref 0.308–3.960)

## 2019-08-07 LAB — T4: T4, Total: 9.2 ug/dL (ref 4.5–12.0)

## 2019-08-07 MED ORDER — HEPARIN SOD (PORK) LOCK FLUSH 100 UNIT/ML IV SOLN
500.0000 [IU] | Freq: Once | INTRAVENOUS | Status: AC | PRN
  Administered 2019-08-07: 500 [IU]
  Filled 2019-08-07: qty 5

## 2019-08-07 MED ORDER — PALONOSETRON HCL INJECTION 0.25 MG/5ML: 0.2500 mg | Freq: Once | INTRAVENOUS | Status: DC

## 2019-08-07 MED ORDER — SODIUM CHLORIDE 0.9% FLUSH
10.0000 mL | INTRAVENOUS | Status: DC | PRN
  Administered 2019-08-07: 10 mL
  Filled 2019-08-07: qty 10

## 2019-08-07 MED ORDER — SODIUM CHLORIDE 0.9 % IV SOLN: 150.0000 mg | Freq: Once | INTRAVENOUS | Status: DC

## 2019-08-07 MED ORDER — PALONOSETRON HCL INJECTION 0.25 MG/5ML
INTRAVENOUS | Status: AC
  Filled 2019-08-07: qty 5

## 2019-08-07 MED ORDER — DEXAMETHASONE SODIUM PHOSPHATE 10 MG/ML IJ SOLN: 10.0000 mg | Freq: Once | INTRAMUSCULAR | Status: DC

## 2019-08-07 MED ORDER — CYANOCOBALAMIN 1000 MCG/ML IJ SOLN: 1000.0000 ug | Freq: Once | INTRAMUSCULAR | Status: DC

## 2019-08-07 MED ORDER — SODIUM CHLORIDE 0.9 % IV SOLN
Freq: Once | INTRAVENOUS | Status: AC
  Filled 2019-08-07: qty 250

## 2019-08-07 MED ORDER — SENNOSIDES-DOCUSATE SODIUM 8.6-50 MG PO TABS: 1.0000 | ORAL_TABLET | Freq: Two times a day (BID) | ORAL | 2 refills | Status: DC

## 2019-08-07 MED ORDER — SODIUM CHLORIDE 0.9 % IV SOLN
200.0000 mg | Freq: Once | INTRAVENOUS | Status: AC
  Administered 2019-08-07: 200 mg via INTRAVENOUS
  Filled 2019-08-07: qty 8

## 2019-08-07 MED ORDER — CYANOCOBALAMIN 1000 MCG/ML IJ SOLN
INTRAMUSCULAR | Status: AC
  Filled 2019-08-07: qty 1

## 2019-08-07 NOTE — Progress Notes (Signed)
Per Dr. Marin Olp the patient will now be receiving single agent Keytruda. All premedications and B12 as well as Alimta/Carboplatinun to be deleted from the careplan per his instructions.

## 2019-08-07 NOTE — Patient Instructions (Signed)
[facility] Discharge Instructions for Patients Receiving Chemotherapy  Today you received the following chemotherapy agents Keytruda  To help prevent nausea and vomiting after your treatment, we encourage you to take your nausea medication as prescribed by MD.   If you develop nausea and vomiting that is not controlled by your nausea medication, call the clinic.   BELOW ARE SYMPTOMS THAT SHOULD BE REPORTED IMMEDIATELY:  *FEVER GREATER THAN 100.5 F  *CHILLS WITH OR WITHOUT FEVER  NAUSEA AND VOMITING THAT IS NOT CONTROLLED WITH YOUR NAUSEA MEDICATION  *UNUSUAL SHORTNESS OF BREATH  *UNUSUAL BRUISING OR BLEEDING  TENDERNESS IN MOUTH AND THROAT WITH OR WITHOUT PRESENCE OF ULCERS  *URINARY PROBLEMS  *BOWEL PROBLEMS  UNUSUAL RASH Items with * indicate a potential emergency and should be followed up as soon as possible.  Feel free to call the clinic should you have any questions or concerns. The clinic phone number is [phone].  Please show the [facility] at check-in to the Emergency Department and triage nurse.

## 2019-08-08 ENCOUNTER — Telehealth: Payer: Self-pay | Admitting: Hematology & Oncology

## 2019-08-08 MED ORDER — DABIGATRAN ETEXILATE MESYLATE 150 MG PO CAPS: 150.0000 mg | ORAL_CAPSULE | Freq: Two times a day (BID) | ORAL | 6 refills | Status: DC

## 2019-08-08 NOTE — Progress Notes (Signed)
Hematology and Oncology Follow Up Visit  [name] [id number] 30-Mar-1956 64 y.o. 08/08/2019   Principle Diagnosis:  Metastatic adenocarcinoma of the lung-brain and bone metastasis -- NO actionable mutations  Current Therapy:    Carboplatinum/Alimta/pembrolizumab-cycle #3-to start on 05/09/2019 -- Carbo/Alimta on hold  Xgeva 120 mg IM q 3 month -- next dose 10/2019     Interim History:  [name] is back for follow-up.  This definitely is the best that I have seen her look.  She comes in walking and.  I realize that she is walking little bit slowly but yet she is still walking in.  She is getting more strength.  Her husband is doing a fantastic job with her.  I know that they are working quite a lot at home with therapy to try to help with her strength.  She did have scans done.  She had a myriad of scans done.  She had a PET scan done on 07/30/2019.  This showed a response to therapy with decreased size and activity in pulmonary nodules and lymph nodes.  She had a mixed response in the bones.  There was some areas that were improved.  There are couple other areas that were slightly more hypermetabolic.  She had resolved pleural effusions.  She did have a CT angiogram done yesterday.  This showed no pulmonary embolism.  She still had the pulmonary nodules which were noted on a PET scan.  She did have a Doppler ultrasound of her legs.  This showed that there was still an occlusive thrombus in the right lower leg.  There is a nonocclusive thrombus in the right femoral vein.  She is on Lovenox.  I think we should try to switch her over to something oral.  I can try her on Pradaxa.  She has not been on Pradaxa before.  This accident a different mechanism.  I think her quality of life will definitely be improved by the Pradaxa.  She seems to be eating a little bit better.  Hopefully, she and her husband will be able to take a little vacation now that she is doing a little bit better.  I  think that because of her just use immunotherapy, this is a good response.  She just cannot tolerate chemotherapy, even at a reduced dose.  Again, her quality of life is her top priority.  Currently, her performance status is ECOG 1-2.  Medications:  Current Outpatient Medications:  .  acetaminophen (TYLENOL) 325 MG tablet, Take 2 tablets (650 mg total) by mouth every 6 (six) hours., Disp:  , Rfl:  .  dronabinol (MARINOL) 5 MG capsule, Take 1 capsule (5 mg total) by mouth 2 (two) times daily before lunch and supper., Disp: 60 capsule, Rfl: 0 .  enoxaparin (LOVENOX) 100 MG/ML injection, Inject 1 mL (100 mg total) into the skin daily., Disp: 90 mL, Rfl: 3 .  feeding supplement, ENSURE ENLIVE, (ENSURE ENLIVE) LIQD, Take 237 mLs by mouth 2 (two) times daily between meals., Disp: 237 mL, Rfl: 12 .  fentaNYL (DURAGESIC) 75 MCG/HR, Place 2 patches onto the skin every 3 (three) days., Disp: 15 patch, Rfl: 0 .  folic acid (FOLVITE) 1 MG tablet, Take 1 tablet (1 mg total) by mouth daily. Start 5-7 days before Alimta chemotherapy. Continue until 21 days after Alimta completed., Disp: 100 tablet, Rfl: 3 .  gabapentin (NEURONTIN) 300 MG capsule, Take 1 capsule (300 mg total) by mouth 3 (three) times daily., Disp: 90 capsule, Rfl: 4 .  granisetron (SANCUSO) 3.1 MG/24HR, Apply to skin starting 24 hours before chemotherapy. Remove after 7 days., Disp: 4 each, Rfl: 4 .  hydrALAZINE (APRESOLINE) 25 MG tablet, Take 25 mg by mouth 2 (two) times daily., Disp: , Rfl:  .  ketorolac (TORADOL) 10 MG tablet, Take 1 tablet (10 mg total) by mouth every 6 (six) hours as needed., Disp: 30 tablet, Rfl: 1 .  lidocaine-prilocaine (EMLA) cream, Apply to affected area once, Disp: 30 g, Rfl: 3 .  lip balm (CARMEX) ointment, Apply topically as needed for lip care., Disp: 7 g, Rfl: 0 .  loratadine (CLARITIN) 10 MG tablet, Take 1 tablet (10 mg total) by mouth daily., Disp: 30 tablet, Rfl: 0 .  LORazepam (ATIVAN) 1 MG tablet, Take 1  tablet (1 mg total) by mouth every 6 (six) hours as needed for anxiety., Disp: 30 tablet, Rfl: 1 .  methocarbamol (ROBAXIN) 500 MG tablet, Take 1 tablet (500 mg total) by mouth every 6 (six) hours as needed for muscle spasms., Disp: 60 tablet, Rfl: 3 .  morphine (MSIR) 15 MG tablet, Take 1 tablet (15 mg total) by mouth every 4 (four) hours as needed for severe pain., Disp: 90 tablet, Rfl: 0 .  Multiple Vitamin (MULTIVITAMIN WITH MINERALS) TABS tablet, Take 1 tablet by mouth daily., Disp: 30 tablet, Rfl: 0 .  ondansetron (ZOFRAN ODT) 8 MG disintegrating tablet, Take 1 tablet (8 mg total) by mouth every 8 (eight) hours as needed for nausea or vomiting., Disp: 20 tablet, Rfl: 2 .  ondansetron (ZOFRAN) 8 MG tablet, Take 1 tablet (8 mg total) by mouth 2 (two) times daily as needed (Nausea or vomiting). Start if needed on the third day after chemotherapy., Disp: 30 tablet, Rfl: 1 .  oxymetazoline (AFRIN) 0.05 % nasal spray, Place 1 spray into both nostrils 2 (two) times daily as needed for congestion., Disp: , Rfl:  .  pantoprazole (PROTONIX) 40 MG tablet, Take 1 tablet (40 mg total) by mouth daily with supper., Disp: 30 tablet, Rfl: 2 .  polyvinyl alcohol (LIQUIFILM TEARS) 1.4 % ophthalmic solution, Place 1 drop into both eyes as needed for dry eyes., Disp: , Rfl:  .  promethazine (PHENERGAN) 12.5 MG tablet, TAKE 1-2 TABLETS BY MOUTH EVERY 4 (FOUR) HOURS AS NEEDED FOR REFRACTORY NAUSEA / VOMITING., Disp: 30 tablet, Rfl: 3 .  lactulose (CHRONULAC) 10 GM/15ML solution, Take 30 mLs (20 g total) by mouth 2 (two) times daily. (Patient not taking: Reported on 07/10/2019), Disp: 1000 mL, Rfl: 4 .  NARCAN 4 MG/0.1ML LIQD nasal spray kit, Place 1 spray into the nose once as needed (overdose). , Disp: , Rfl:  .  senna-docusate (SENOKOT-S) 8.6-50 MG tablet, Take 1 tablet by mouth 2 (two) times daily., Disp: 100 tablet, Rfl: 2  Allergies:  No Known Allergies  Past Medical History, Surgical history, Social history,  and Family History were reviewed and updated.  Review of Systems: Review of Systems  Constitutional: Negative.   HENT:  Negative.   Eyes: Negative.   Respiratory: Negative.   Cardiovascular: Negative.   Gastrointestinal: Negative.   Endocrine: Negative.   Genitourinary: Negative.    Musculoskeletal: Positive for arthralgias and back pain.  Skin: Negative.   Neurological: Positive for extremity weakness.  Hematological: Negative.   Psychiatric/Behavioral: Negative.     Physical Exam:    height is '5\' 7"'  (1.702 m) and weight is 114 lb (51.7 kg). Her temporal temperature is 97.1 F (36.2 C) (abnormal). Her blood pressure is 121/77 and  her pulse is 80. Her respiration is 18 and oxygen saturation is 99%.   Wt Readings from Last 3 Encounters:  08/07/19 114 lb (51.7 kg)  07/10/19 114 lb 11.2 oz (52 kg)  06/18/19 116 lb 9.6 oz (52.9 kg)    Physical Exam Vitals reviewed.  HENT:     Head: Normocephalic and atraumatic.  Eyes:     Pupils: Pupils are equal, round, and reactive to light.  Cardiovascular:     Rate and Rhythm: Normal rate and regular rhythm.     Heart sounds: Normal heart sounds.  Pulmonary:     Effort: Pulmonary effort is normal.     Breath sounds: Normal breath sounds.  Abdominal:     General: Bowel sounds are normal.     Palpations: Abdomen is soft.  Musculoskeletal:        General: No tenderness or deformity. Normal range of motion.     Cervical back: Normal range of motion.     Comments: Extremities still show some weakness over on the right side.  Maybe some slight swelling in the right hand.  No obvious venous cord is noted in the lower extremities or right arm.  Lymphadenopathy:     Cervical: No cervical adenopathy.  Skin:    General: Skin is warm and dry.     Findings: No erythema or rash.  Neurological:     Mental Status: She is alert and oriented to person, place, and time.  Psychiatric:        Behavior: Behavior normal.        Thought Content:  Thought content normal.        Judgment: Judgment normal.      Lab Results  Component Value Date   WBC 6.1 08/06/2019   HGB 12.1 08/06/2019   HCT 36.4 08/06/2019   MCV 95.0 08/06/2019   PLT 403 (H) 08/06/2019     Chemistry      Component Value Date/Time   NA 137 08/06/2019 1320   K 4.2 08/06/2019 1320   CL 102 08/06/2019 1320   CO2 27 08/06/2019 1320   BUN 13 08/06/2019 1320   CREATININE 0.56 08/06/2019 1320      Component Value Date/Time   CALCIUM 9.8 08/06/2019 1320   ALKPHOS 91 08/06/2019 1320   AST 17 08/06/2019 1320   ALT 14 08/06/2019 1320   BILITOT 0.3 08/06/2019 1320       Impression and Plan: [name] is a 64 year old yo female.  She is or was a smoker.  She now has metastatic adenocarcinoma of the right lung.  It is nice to see that her quality life is doing better right now.  She certainly is making improvement.  Hopefully, she will continue to make improvement with her performance status.  We will continue just with the pembrolizumab.  Again she just has a very low tolerance to chemotherapy and since her quality of life is her priority, I think that single agent immunotherapy would be reasonable.  I probably would not repeat any other scan for another 2-3 months.  We will see how Pradaxa does for her with respect to her thromboembolic disease.  I will have her on Pradaxa at 150 mg p.o. twice daily.  We will plan to get her back to see [place] in another 3 weeks.  If there are any problems with her sooner, she will come in for [place] to check out.  I am just happy for her.  She has fought so hard  going back to December.       Volanda Napoleon, MD 4/15/20213:38 PM

## 2019-08-08 NOTE — Telephone Encounter (Signed)
No los 4/14

## 2019-08-12 DIAGNOSIS — R29898 Other symptoms and signs involving the musculoskeletal system: Secondary | ICD-10-CM

## 2019-08-13 ENCOUNTER — Encounter: Payer: Self-pay | Admitting: Hematology & Oncology

## 2019-08-14 ENCOUNTER — Other Ambulatory Visit: Payer: Self-pay | Admitting: Radiation Therapy

## 2019-08-14 ENCOUNTER — Ambulatory Visit
Admission: RE | Admit: 2019-08-14 | Discharge: 2019-08-14 | Disposition: A | Discharge status: Home / Self Care | Payer: BC Managed Care – PPO | Source: Ambulatory Visit | Attending: Radiation Oncology | Admitting: Radiation Oncology

## 2019-08-14 DIAGNOSIS — C7931 Secondary malignant neoplasm of brain: Secondary | ICD-10-CM

## 2019-08-14 MED ORDER — GADOBENATE DIMEGLUMINE 529 MG/ML IV SOLN
10.0000 mL | Freq: Once | INTRAVENOUS | Status: AC | PRN
  Administered 2019-08-14: 10 mL via INTRAVENOUS

## 2019-08-14 MED ORDER — HEPARIN SOD (PORK) LOCK FLUSH 100 UNIT/ML IV SOLN
500.0000 [IU] | Freq: Once | INTRAVENOUS | Status: AC
  Administered 2019-08-14: 500 [IU] via INTRAVENOUS

## 2019-08-14 MED ORDER — SODIUM CHLORIDE 0.9% FLUSH
10.0000 mL | INTRAVENOUS | Status: DC | PRN
  Administered 2019-08-14: 10 mL via INTRAVENOUS

## 2019-08-14 NOTE — Addendum Note (Signed)
Addended by: Pincus Large on: 08/14/2019 05:21 PM   Modules accepted: Orders

## 2019-08-15 ENCOUNTER — Inpatient Hospital Stay
Admission: RE | Admit: 2019-08-15 | Discharge: 2019-08-15 | Disposition: A | Discharge status: Home / Self Care | Payer: BC Managed Care – PPO | Source: Ambulatory Visit | Attending: Urology | Admitting: Urology

## 2019-08-15 ENCOUNTER — Other Ambulatory Visit: Payer: Self-pay | Admitting: *Deleted

## 2019-08-15 ENCOUNTER — Other Ambulatory Visit: Payer: Self-pay | Admitting: Urology

## 2019-08-15 ENCOUNTER — Encounter: Payer: Self-pay | Admitting: Hematology & Oncology

## 2019-08-15 DIAGNOSIS — C7931 Secondary malignant neoplasm of brain: Secondary | ICD-10-CM

## 2019-08-15 MED ORDER — LIDOCAINE-PRILOCAINE 2.5-2.5 % EX CREA: 1.0000 "application " | TOPICAL_CREAM | CUTANEOUS | 0 refills | Status: DC | PRN

## 2019-08-15 MED ORDER — SENNOSIDES-DOCUSATE SODIUM 8.6-50 MG PO TABS: 1.0000 | ORAL_TABLET | Freq: Two times a day (BID) | ORAL | 2 refills | Status: AC

## 2019-08-15 MED ORDER — LORAZEPAM 1 MG PO TABS: 1.0000 mg | ORAL_TABLET | Freq: Four times a day (QID) | ORAL | 1 refills | Status: DC | PRN

## 2019-08-15 MED ORDER — DEXAMETHASONE 4 MG PO TABS: 4.0000 mg | ORAL_TABLET | Freq: Three times a day (TID) | ORAL | 0 refills | Status: DC

## 2019-08-15 NOTE — Progress Notes (Signed)
Radiation Oncology         [phone] ________________________________  Name: [name] MRN: [id number]  Date: 08/15/2019  DOB: [birth year]  Post Treatment Note  CC: [name], MD  [name], MD  Diagnosis:   64 y.o. 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  Interval Since Last Radiation:  7 weeks   06/28/19:   L3 was treated to 8 Gy in a single fraction   06/11/19 - 06/24/19:  The chest/mediastinum were treated to a dose of 30 Gy in 10 fractions of 3 Gy each.  05/14/19 and 05/16/19:  1.  The Left Scapular lesion and T5 transverse process were treated to 8 Gy in one fraction  2.  The left anterior 6th rib metastasis was treated to 8 Gy in one fraction   05/02/19 1. The left scapula was treated to 3 Gy in a single fraction of a planned 10 fractions 2. The left ribs were treated to 3 Gy in a single fraction of a planned 10 fractions  Narrative:  I spoke with the patient to conduct her scheduled follow up visit to review her recent brain MRI scan via telephone to spare the patient unnecessary potential exposure in the healthcare setting during the current COVID-19 pandemic.  The patient was notified in advance and gave permission to proceed with this visit format. She tolerated her most recent radiotherapy to a painful site of metastasis at L3 very well and did not experience any negative effects.        In summary, she initially presented to the ED on 04/01/19 with progressive right sided weakness in the upper and lower extremities and was found to have a posterior frontal brain metastasis. Her systemic work up revealed a 3.6 cm spiculated mass in the medial right upper lobe lung, compatible with primary bronchogenic neoplasm.  Additionally, there were multiple bilateral pulmonary nodules, associated thoracic nodal metastases and multiple bone lesions. She underwent craniotomy and resection of the posterior frontal brain tumor for both  therapeutic and diagnostic purposes with Dr. Annette Stable on 04/05/2019, and pathology confirmed adenocarcinoma consistent with metastatic lung adenocarcinoma. No residual enhancement was seen on postop brain MRI. Her pain delayed her planned postop radiotherapy to the brain so ultimately, she elected not to proceed with the postop adjuvant brain radiation. She proceeded with chemotherapy though this resulted in pancytopenia which led to a hospitalization. While rehabilitating, she continued to have significant bone pain in the left scapula and left anterior 6th rib. She was going to initially receive 10 fractions for her bone pain, but ultimately her treatment was converted to a hypofractionated course due to inability to tolerate daily treatments due to poorly controlled pain. Once her pain was better controlled, she did return for additional palliative radiotherapy to the sites of bony pain which did provide excellent pain relief. PET imaging on 05/06/19 revealed multiple sites of disease in the bony skeleton including the right pelvis, lumbar and thoracic spine, as well as nodes in the chest and supraclavicular region. She also had faint hypermetabolism along the right glenoid region and multiple suspicious lesions in the ribs. She proceeded with a 2 week course of palliative radiotherapy to the adenopathy in the chest and neck/supraclavicular region, completed on 06/24/2019 and tolerated well.  She was able to resume systemic therapy with pembrolizumab only, under the care and direction of Dr. Marin Olp shortly after completion of radiation but developed severe low back pain which was poorly controlled  with oral pain medications so she elected to proceed with a single fraction of palliative radiotherapy to a known site of metastatic disease at L3 which correlated with her pain/symptoms.   This was completed on 06/28/19 and she tolerated this well.  She has decided not to resume chemotherapy and instead has continued with the  Pembrolizumab immunotherapy only. A repeat PET scan was performed on 07/30/19 and shows a mixed response to therapy with decreased size/hypermetabolism of pulmonary nodules and nodes, particularly in the thoracic and less so lower cervical/abdominal. There was a mixed response response of osseous metastasis with some lesions appearing to have undergone interval healing and decreased hypermetabolism, while other lesions are new/increasingly hypermetabolic in the lumbar spine at L1 and L5.     INTERVAL HISTORY:  She had a recent follow up visit with Dr. Marin Olp on 08/07/19 and the plan is to continue on single agent Keytruda due to intolerance of chemotherapy, even at the reduced dose. She had her brain MRI on 08/14/19 and this shows a total of 11 new metastases identified scattered throughout the cerebellum and both cerebral hemispheres with 4 sub centimeter lesions in the right cerebellar hemisphere, 2 metastases in the right cerebral hemisphere with the largest measuring 13 mm in the right frontal lobe and a 5 mm lesion in the right parietal vertex adjacent to the falx and 5 new metastases in the left cerebral hemisphere with a 3 mm lesion in the left temporal lobe, a 3 mm lesion in the left temporoparietal junction, an 11 mm lesion in the left frontal lobe, a 5 mm lesion in the left posterior frontal lobe and a 3 mm lesion in the left parietal lobe. There is considerable vasogenic edema, particularly affecting the cerebellum and both frontal regions. The previous site of metastasis at the left parietal vertex has a good appearance without evidence of residual or recurrent disease in that location.          On review of systems, the patient states that she is doing fairly well in general.  She sounds terrific on the phone, in good spirits but she does tell me that since this morning, she has felt "off" with some imbalance, slower gait and difficulty walking as well as bladder incontinence.  She denies bowel  incontinence or back pain.  She denies headaches, changes in auditory or visual acuity, dizziness, difficulty with speech or seizure activity. Her pain is much better controlled though she still relies on pain medications around the clock.  She denies any focal site of pain in the spine and denies any associated paraesthesias or focal weakness in the lower extremities. She reports that the pain is tolerable with her current pain regimen. She has regained some function in the right upper extremity but not as much as she would like and/or hoped.  The right lower extremity weakness has almost resolved completely. She has had some swelling in her ankles and in the right upper extremity and had a doppler U/S on 08/06/19 which showed a chronic nonocclusive DVT in the right femoral vein as well as an occlusive DVT in the right peroneal vein despite being on anticoagulation with Lovenox. Dr. Marin Olp recently switched her to Pradaxa oral anticoagulation.  ALLERGIES:  has No Known Allergies.  Meds: Current Outpatient Medications  Medication Sig Dispense Refill  . acetaminophen (TYLENOL) 325 MG tablet Take 2 tablets (650 mg total) by mouth every 6 (six) hours.    . dabigatran (PRADAXA) 150 MG CAPS capsule Take 1  capsule (150 mg total) by mouth 2 (two) times daily. 60 capsule 6  . dexamethasone (DECADRON) 4 MG tablet Take 1 tablet (4 mg total) by mouth 3 (three) times daily. 40 tablet 0  . dronabinol (MARINOL) 5 MG capsule Take 1 capsule (5 mg total) by mouth 2 (two) times daily before lunch and supper. 60 capsule 0  . enoxaparin (LOVENOX) 100 MG/ML injection Inject 1 mL (100 mg total) into the skin daily. 90 mL 3  . feeding supplement, ENSURE ENLIVE, (ENSURE ENLIVE) LIQD Take 237 mLs by mouth 2 (two) times daily between meals. 237 mL 12  . fentaNYL (DURAGESIC) 75 MCG/HR Place 2 patches onto the skin every 3 (three) days. 15 patch 0  . folic acid (FOLVITE) 1 MG tablet Take 1 tablet (1 mg total) by mouth daily.  Start 5-7 days before Alimta chemotherapy. Continue until 21 days after Alimta completed. 100 tablet 3  . gabapentin (NEURONTIN) 300 MG capsule Take 1 capsule (300 mg total) by mouth 3 (three) times daily. 90 capsule 4  . granisetron (SANCUSO) 3.1 MG/24HR Apply to skin starting 24 hours before chemotherapy. Remove after 7 days. 4 each 4  . hydrALAZINE (APRESOLINE) 25 MG tablet Take 25 mg by mouth 2 (two) times daily.    Marland Kitchen ketorolac (TORADOL) 10 MG tablet Take 1 tablet (10 mg total) by mouth every 6 (six) hours as needed. 30 tablet 1  . lactulose (CHRONULAC) 10 GM/15ML solution Take 30 mLs (20 g total) by mouth 2 (two) times daily. (Patient not taking: Reported on 07/10/2019) 1000 mL 4  . lidocaine-prilocaine (EMLA) cream Apply to affected area once 30 g 3  . lidocaine-prilocaine (EMLA) cream Apply 1 application topically as needed. 30 g 0  . lip balm (CARMEX) ointment Apply topically as needed for lip care. 7 g 0  . loratadine (CLARITIN) 10 MG tablet Take 1 tablet (10 mg total) by mouth daily. 30 tablet 0  . LORazepam (ATIVAN) 1 MG tablet Take 1 tablet (1 mg total) by mouth every 6 (six) hours as needed for anxiety. 30 tablet 1  . methocarbamol (ROBAXIN) 500 MG tablet Take 1 tablet (500 mg total) by mouth every 6 (six) hours as needed for muscle spasms. 60 tablet 3  . morphine (MSIR) 15 MG tablet Take 1 tablet (15 mg total) by mouth every 4 (four) hours as needed for severe pain. 90 tablet 0  . Multiple Vitamin (MULTIVITAMIN WITH MINERALS) TABS tablet Take 1 tablet by mouth daily. 30 tablet 0  . NARCAN 4 MG/0.1ML LIQD nasal spray kit Place 1 spray into the nose once as needed (overdose).     . ondansetron (ZOFRAN ODT) 8 MG disintegrating tablet Take 1 tablet (8 mg total) by mouth every 8 (eight) hours as needed for nausea or vomiting. 20 tablet 2  . ondansetron (ZOFRAN) 8 MG tablet Take 1 tablet (8 mg total) by mouth 2 (two) times daily as needed (Nausea or vomiting). Start if needed on the third day  after chemotherapy. 30 tablet 1  . oxymetazoline (AFRIN) 0.05 % nasal spray Place 1 spray into both nostrils 2 (two) times daily as needed for congestion.    . pantoprazole (PROTONIX) 40 MG tablet Take 1 tablet (40 mg total) by mouth daily with supper. 30 tablet 2  . polyvinyl alcohol (LIQUIFILM TEARS) 1.4 % ophthalmic solution Place 1 drop into both eyes as needed for dry eyes.    . promethazine (PHENERGAN) 12.5 MG tablet TAKE 1-2 TABLETS BY MOUTH EVERY  4 (FOUR) HOURS AS NEEDED FOR REFRACTORY NAUSEA / VOMITING. 30 tablet 3  . senna-docusate (SENOKOT-S) 8.6-50 MG tablet Take 1 tablet by mouth 2 (two) times daily. 100 tablet 2   No current facility-administered medications for this encounter.    Physical Findings:  vitals were not taken for this visit.   /Unable to assess due to telephone follow up visit format.  Lab Findings: Lab Results  Component Value Date   WBC 6.1 08/06/2019   HGB 12.1 08/06/2019   HCT 36.4 08/06/2019   MCV 95.0 08/06/2019   PLT 403 (H) 08/06/2019     Radiographic Findings: CT ANGIO CHEST PE W OR WO CONTRAST  Result Date: 08/06/2019 CLINICAL DATA:  History of lung cancer. History of pulmonary embolism. EXAM: CT ANGIOGRAPHY CHEST WITH CONTRAST TECHNIQUE: Multidetector CT imaging of the chest was performed using the standard protocol during bolus administration of intravenous contrast. Multiplanar CT image reconstructions and MIPs were obtained to evaluate the vascular anatomy. CONTRAST:  150m OMNIPAQUE IOHEXOL 350 MG/ML SOLN COMPARISON:  May 13, 2019. July 30, 2019. FINDINGS: Cardiovascular: Satisfactory opacification of the pulmonary arteries to the segmental level. No evidence of pulmonary embolism. Normal heart size. No pericardial effusion. Atherosclerosis of thoracic aorta is noted without aneurysm or dissection. Mediastinum/Nodes: Thyroid gland is unremarkable. Esophagus is unremarkable. 3.1 x 2.9 cm precarinal mass is noted which is slightly decreased  compared to prior exam consistent with metastatic disease. Lungs/Pleura: No pneumothorax or pleural effusion is noted. 2.4 cm mass is seen medially in left lower lobe consistent with malignancy or metastatic disease. Grossly stable 10 x 8 mm nodule is noted in left lower lobe best seen on image number 55 of series 5. 23 x 18 mm left perihilar mass is noted which is increased in size compared to prior exam, best seen image number 43 of series 5. 18 x 8 mm mass is noted in medial portion of right upper lobe which is significantly decreased compared to prior exam. 14 mm nodule is seen in right lower lobe which is slightly decreased compared to prior exam and consistent with metastatic disease. 7 mm mass is noted more medially in the right lower lobe which is decreased compared to prior exam. Upper Abdomen: 2.1 cm peripherally enhancing lesion is noted in right hepatic lobe which most likely represents hemangioma. Musculoskeletal: Multiple sclerotic densities are noted in the thoracic spine consistent with metastatic disease. Multiple sclerotic densities are seen in the ribs bilaterally as well suggesting metastatic disease. Review of the MIP images confirms the above findings. IMPRESSION: 1. No definite evidence of pulmonary embolus. 2. Multiple bilateral pulmonary masses are noted, consistent with malignancy or metastatic disease. The largest is seen in left lower lobe measuring 2.4 cm. 3. 3.1 x 2.9 cm precarinal mass is noted which is slightly decreased compared to prior exam consistent with metastatic disease. 4. Multiple sclerotic densities are noted in the thoracic spine and ribs bilaterally suggesting metastatic disease. 5. 2.1 cm peripherally enhancing lesion is noted in right hepatic lobe which most likely represents hemangioma. Aortic Atherosclerosis (ICD10-I70.0). Electronically Signed   By: JMarijo ConceptionM.D.   On: 08/06/2019 14:15   MR Brain W Wo Contrast  Result Date: 08/15/2019 CLINICAL DATA:   Follow-up resection superior left frontal lobe mass EXAM: MRI HEAD WITHOUT AND WITH CONTRAST TECHNIQUE: Multiplanar, multiecho pulse sequences of the brain and surrounding structures were obtained without and with intravenous contrast. CONTRAST:  178mMULTIHANCE GADOBENATE DIMEGLUMINE 529 MG/ML IV SOLN COMPARISON:  Multiple  previous studies as distant as 04/01/2019 FINDINGS: Brain: 4 new metastases seen in the cerebellum. In the right hemisphere, there is a 3-4 mm metastasis axial image 35. 8-9 mm metastasis axial image 39. In the midline vermis, there is an 8 mm metastasis axial image 41. In the left hemisphere there is an 8-9 mm metastasis axial image 39. There is considerable edema associated with these metastases, particularly in the vermis and left hemisphere. Two new metastases in the right cerebral hemisphere, the largest in the right frontal lobe, measuring 13 mm in size. Pronounced regional vasogenic edema. Second small metastasis at the left parietal vertex medially adjacent to the falx measuring 5 mm. Five new metastases in the left cerebral hemisphere. 3 mm metastasis posteromedial temporal lobe axial image 58. 3 mm metastasis temporoparietal junction axial image 90. 11 mm metastasis frontal lobe image 109. 5 mm metastasis posterior frontal lobe image 118. 3 mm metastasis parietal lobe image 120. Considerable vasogenic edema, particularly in the frontal lobe. Previously site of metastasis resection at the left parietal vertex has a good appearance with some linear enhancing scar but no evidence of residual or recurrent tumor in that location. Chronic small-vessel ischemic changes elsewhere throughout the brain affecting the hemispheric white matter and brainstem. No hydrocephalus. No extra-axial collection. Vascular: Major vessels at the base of the brain show flow. Skull and upper cervical spine: Metastatic disease now seen replacing the marrow of the C4 vertebral body. No calvarial metastatic disease  identified. Sinuses/Orbits: Clear/normal Other: None IMPRESSION: Total of 11 new metastases identified scattered throughout the cerebellum and both cerebral hemispheres as outlined above. Considerable vasogenic edema, particularly affecting the cerebellum and both frontal regions. Good appearance at the site of previous resection of the metastasis at the left parietal vertex without evidence of residual or recurrent disease in that location. Electronically Signed   By: Nelson Chimes M.D.   On: 08/15/2019 08:53   NM PET Image Restag (PS) Skull Base To Thigh  Result Date: 07/30/2019 CLINICAL DATA:  Subsequent treatment strategy for restaging of lung cancer with brain metastasis. Metastatic non-small-cell carcinoma. EXAM: NUCLEAR MEDICINE PET SKULL BASE TO THIGH TECHNIQUE: 5.7 mCi F-18 FDG was injected intravenously. Full-ring PET imaging was performed from the skull base to thigh after the radiotracer. CT data was obtained and used for attenuation correction and anatomic localization. Fasting blood glucose: 87 mg/dl COMPARISON:  05/03/2019 abdominopelvic CT. 05/03/2019 chest CT. Most recent PET of 05/06/2019. FINDINGS: Mediastinal blood pool activity: SUV max 2.3 Liver activity: SUV max NA NECK: Left supraclavicular node measures 1.0 cm and a S.U.V. max of 17.9 today on 48/4. Compare 1.2 cm and a S.U.V. max of 18.0 on the prior exam (when remeasured). Low right jugular/supraclavicular node measures 1.0 cm and a S.U.V. max of 5.6 today versus 2.2 cm and a S.U.V. max of 19.5 on the prior. Incidental CT findings: Bilateral carotid atherosclerosis. CHEST: Improvement in mediastinal adenopathy. Right paratracheal node measures 2.1 cm and a S.U.V. max of 3.0 on 58/4 versus 3.8 cm and a S.U.V. max of 22.5 on the prior. An index central left upper lobe pulmonary nodule measures 1.9 cm and a S.U.V. max of 17.8 on 30/8. Compare 2.0 cm and a S.U.V. max of 15.9 on the prior. Right apical nodule measures 1.0 cm and a S.U.V. max  of 3.8 on 14/8. Compare 2.0 cm and a S.U.V. max of 13.7 on the prior exam (when remeasured). Incidental CT findings: Emphysema. Aortic and coronary artery atherosclerosis. Right Port-A-Cath tip high  right atrium. Resolved right-sided pleural effusion. Trace left pleural fluid remains. ABDOMEN/PELVIS: Left periaortic node measures 8 mm and a S.U.V. max of 6.7 on 119/4. Compare 1.0 cm and a S.U.V. max of 18.6 on the prior exam. Incidental CT findings: Normal adrenal glands. High right hepatic lobe low-density lesion is similar, likely a cyst. Aortic atherosclerosis. Colonic stool burden suggests constipation. SKELETON: Left anterior sixth rib lesion is significantly decreased in size and hypermetabolism. Measures a S.U.V. max of 4.4 on 94/4. At the site of a 3.9 x 5.1 cm soft tissue mass which measured a S.U.V. max of 21.9 on the prior. New or significantly increased L1 vertebral lesion with hypermetabolism at a S.U.V. max of 11.8. Lateral left transverse process L5 hypermetabolic lesion measures a S.U.V. max of 8.1 today and is new. An adjacent left L5 vertebral body newly sclerotic lesion measures 1.1 cm and a S.U.V. max of 2.1. This measures a S.U.V. max of 11.6 on the prior. Incidental CT findings: Decreased hypermetabolism and heterogeneous density within the right acetabulum. IMPRESSION: 1. Response to therapy of soft tissue disease, as evidenced by decreased size/hypermetabolism of pulmonary nodules and nodes (thoracic and less so lower cervical/abdominal), as detailed above. 2. Mixed response of osseous metastasis. Some lesions have undergone interval healing and decreased hypermetabolism, while other lesions are new/increasingly hypermetabolic. 3. Resolved right and significantly decreased left-sided pleural effusion. 4. Aortic atherosclerosis (ICD10-I70.0), coronary artery atherosclerosis and emphysema (ICD10-J43.9). Electronically Signed   By: Abigail Miyamoto M.D.   On: 07/30/2019 14:24   US Venous Img Lower  Unilateral Right (DVT)  Result Date: 08/06/2019 CLINICAL DATA:  History of DVT, currently on anticoagulation. Evaluate for acute or chronic DVT. EXAM: RIGHT LOWER EXTREMITY VENOUS DOPPLER ULTRASOUND TECHNIQUE: Gray-scale sonography with graded compression, as well as color Doppler and duplex ultrasound were performed to evaluate the lower extremity deep venous systems from the level of the common femoral vein and including the common femoral, femoral, profunda femoral, popliteal and calf veins including the posterior tibial, peroneal and gastrocnemius veins when visible. The superficial great saphenous vein was also interrogated. Spectral Doppler was utilized to evaluate flow at rest and with distal augmentation maneuvers in the common femoral, femoral and popliteal veins. COMPARISON:  None. FINDINGS: Contralateral Common Femoral Vein: Respiratory phasicity is normal and symmetric with the symptomatic side. No evidence of thrombus. Normal compressibility. Common Femoral Vein: No evidence of thrombus. Normal compressibility, respiratory phasicity and response to augmentation. Saphenofemoral Junction: No evidence of thrombus. Normal compressibility and flow on color Doppler imaging. Profunda Femoral Vein: No evidence of thrombus. Normal compressibility and flow on color Doppler imaging. Femoral Vein: There is mixed echogenic nonocclusive wall thickening/chronic DVT involving the proximal (image 13), mid (image 19) and distal (image 24) aspects of the right superficial femoral vein. Popliteal Vein: No evidence of acute or chronic thrombus. Normal compressibility, respiratory phasicity and response to augmentation. Calf Veins: There is hypoechoic occlusive thrombus within the right peroneal vein (image 30). The right posterior tibial vein appears patent where imaged. Superficial Great Saphenous Vein: No evidence of thrombus. Normal compressibility. Venous Reflux:  None. Other Findings: Incidentally noted scattered  bilateral atherosclerotic plaque. IMPRESSION: The examination is positive for age-indeterminate, though given provided history, presumably chronic nonocclusive DVT involving the right femoral vein as well as occlusive DVT involving the right peroneal vein. Electronically Signed   By: Sandi Mariscal M.D.   On: 08/06/2019 14:55    Impression/Plan: 1. 64 y.o. female with Stage IV, NSCLC, adenocarcinoma of the RML  with brain and bone metastases. At this point, the patient would potentially benefit from radiotherapy. The options include whole brain irradiation versus stereotactic radiosurgery. There are pros and cons associated with each of these potential treatment options. Whole brain radiotherapy would treat the known metastatic deposits and help provide some reduction of risk for future brain metastases. However, whole brain radiotherapy carries potential risks including hair loss, subacute somnolence, and neurocognitive changes including a possible reduction in short-term memory. Whole brain radiotherapy also may carry a lower likelihood of tumor control at the treatment sites because of the low-dose used. Stereotactic radiosurgery carries a higher likelihood for local tumor control at the targeted sites with lower associated risk for neurocognitive changes such as memory loss. However, the use of stereotactic radiosurgery in this setting may leave the patient at increased risk for new brain metastases elsewhere in the brain as high as 50-60%. Accordingly, patients who receive stereotactic radiosurgery in this setting should undergo ongoing surveillance imaging with brain MRI more frequently in order to identify and treat new small brain metastases before they become symptomatic. Stereotactic radiosurgery does carry some different risks, including a risk of radionecrosis.  We discussed the dilemma regarding whole brain radiotherapy versus stereotactic radiosurgery. We discussed the pros and cons of each. We also  discussed the logistics and delivery of each and reviewed the results associated with each of the treatments described above. The patient seems to understand the treatment options and would like to proceed with stereotactic radiosurgery in an effort to maintain the best QOL and performance status.  Dr. Tammi Klippel recommends a single fraction of SRS to the 11 new metastatic lesions as well as the resection cavity from her previous surgery since the previous planned post-op radiotherapy had been postponed to due to poorly controlled pain and inability to tolerate the treatment planning. I have also spoken with Dr. Marin Olp regarding starting steroids to help with the edema and symptom management and he is in agreement with starting 55m of Decadron TID until her treatment is completed and then tapering off the steroids at that time.  I have sent the prescription to her pharmacy and she will start this today.  She has given verbal permission to proceed so we have her tentatively scheduled for CT SIM/treatment planning at 1pm on Friday 08/16/19 in preparation for her treatment on 08/23/19 in coordination with Dr. PAnnette Stableof neurosurgery.  We will sign formal written consent for treatment at the time of her treatment planning appointment and a copy of this document will be placed in her medical record.  I spent 60 minutes minutes in telephone conversation with the patient and her husband, ERandall Hisswith more than 50% of that time spent in counseling and/or coordination of care.          ANicholos Johns PA-C

## 2019-08-16 ENCOUNTER — Ambulatory Visit
Admission: RE | Admit: 2019-08-16 | Discharge: 2019-08-16 | Disposition: A | Discharge status: Home / Self Care | Payer: BC Managed Care – PPO | Source: Ambulatory Visit | Attending: Radiation Oncology | Admitting: Radiation Oncology

## 2019-08-16 ENCOUNTER — Other Ambulatory Visit: Payer: Self-pay

## 2019-08-16 VITALS — BP 130/83 | HR 79 | Temp 98.0°F | Resp 18 | Ht 67.0 in

## 2019-08-16 DIAGNOSIS — G9389 Other specified disorders of brain: Secondary | ICD-10-CM | POA: Diagnosis not present

## 2019-08-16 DIAGNOSIS — C7931 Secondary malignant neoplasm of brain: Secondary | ICD-10-CM

## 2019-08-16 DIAGNOSIS — C7951 Secondary malignant neoplasm of bone: Secondary | ICD-10-CM | POA: Diagnosis not present

## 2019-08-16 DIAGNOSIS — C3490 Malignant neoplasm of unspecified part of unspecified bronchus or lung: Secondary | ICD-10-CM

## 2019-08-16 DIAGNOSIS — C7949 Secondary malignant neoplasm of other parts of nervous system: Secondary | ICD-10-CM

## 2019-08-16 MED ORDER — HEPARIN SOD (PORK) LOCK FLUSH 100 UNIT/ML IV SOLN
500.0000 [IU] | Freq: Once | INTRAVENOUS | Status: AC
  Administered 2019-08-16: 500 [IU] via INTRAVENOUS

## 2019-08-16 MED ORDER — SODIUM CHLORIDE 0.9% FLUSH
10.0000 mL | Freq: Once | INTRAVENOUS | Status: AC
  Administered 2019-08-16: 10 mL via INTRAVENOUS

## 2019-08-16 NOTE — Progress Notes (Signed)
  Radiation Oncology         [phone]) [phone] ________________________________  Name: [name] MRN: [id number]  Date: 08/16/2019  DOB: 12-Nov-1955  SIMULATION AND TREATMENT PLANNING NOTE    ICD-10-CM   1. 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  Identity was confirmed.  All relevant records and images related to the planned course of therapy were reviewed.  The patient freely provided informed written consent to proceed with treatment after reviewing the details related to the planned course of therapy. The consent form was witnessed and verified by the simulation staff. Intravenous access was established for contrast administration. Then, the patient was set-up in a stable reproducible supine position for radiation therapy.  A relocatable thermoplastic stereotactic head frame was fabricated for precise immobilization.  CT images were obtained.  Surface markings were placed.  The CT images were loaded into the planning software and fused with the patient's targeting MRI scan.  Then the target and avoidance structures were contoured.  Treatment planning then occurred.  The radiation prescription was entered and confirmed.  I have requested 3D planning  I have requested a DVH of the following structures: Brain stem, brain, left eye, right eye, lenses, optic chiasm, target volumes, uninvolved brain, and normal tissue.    SPECIAL TREATMENT PROCEDURE:  The planned course of therapy using radiation constitutes a special treatment procedure. Special care is required in the management of this patient for the following reasons. This treatment constitutes a Special Treatment Procedure for the following reason: High dose per fraction requiring special monitoring for increased toxicities of treatment including daily  imaging.  The special nature of the planned course of radiotherapy will require increased physician supervision and oversight to ensure patient's safety with optimal treatment outcomes.  PLAN:  The patient will receive 20 Gy in 1 fraction to each of 11 new metastases and 18 Gy to her postoperative brain met resection site, using a single isocenter VMAT plan  ________________________________  [name]. [name], M.D.

## 2019-08-16 NOTE — Progress Notes (Signed)
Patient tolerated port access and deaccess well. Following completion of CT simulation this RN flushed the power port via protocol. Needle intact upon removal. Applied a bandaid to old access site.

## 2019-08-16 NOTE — Progress Notes (Signed)
Has armband been applied?  Yes.    Does patient have an allergy to IV contrast dye?: No.   Has patient ever received premedication for IV contrast dye?: No.   Does patient take metformin?: No.  If patient does take metformin when was the last dose: n/a  Date of lab work: 08/06/2019    IV site: subclavian right, condition no redness  Has IV site been added to flowsheet?  Yes.    BP 130/83   Pulse 79   Temp 98 F (36.7 C)   Resp 18   Ht 5\' 7"  (1.702 m)   SpO2 95%   BMI 17.85 kg/m

## 2019-08-20 ENCOUNTER — Encounter: Payer: Self-pay | Admitting: Hematology & Oncology

## 2019-08-21 ENCOUNTER — Ambulatory Visit: Payer: BC Managed Care – PPO | Admitting: Urology

## 2019-08-21 ENCOUNTER — Inpatient Hospital Stay: Payer: BC Managed Care – PPO

## 2019-08-21 DIAGNOSIS — C7951 Secondary malignant neoplasm of bone: Secondary | ICD-10-CM | POA: Diagnosis not present

## 2019-08-21 DIAGNOSIS — C7931 Secondary malignant neoplasm of brain: Secondary | ICD-10-CM | POA: Diagnosis not present

## 2019-08-21 DIAGNOSIS — C7949 Secondary malignant neoplasm of other parts of nervous system: Secondary | ICD-10-CM | POA: Diagnosis not present

## 2019-08-21 DIAGNOSIS — G9389 Other specified disorders of brain: Secondary | ICD-10-CM | POA: Diagnosis not present

## 2019-08-21 NOTE — Progress Notes (Signed)
Pharmacist Chemotherapy Monitoring - Follow Up Assessment    I verify that I have reviewed each item in the below checklist:  . Regimen for the patient is scheduled for the appropriate day and plan matches scheduled date. Marland Kitchen Appropriate non-routine labs are ordered dependent on drug ordered. . If applicable, additional medications reviewed and ordered per protocol based on lifetime cumulative doses and/or treatment regimen.   Plan for follow-up and/or issues identified: Yes . I-vent associated with next due treatment: Yes . MD and/or nursing notified: Yes   Kennith Center, Pharm.D., CPP 08/21/2019@3 :00 PM

## 2019-08-23 ENCOUNTER — Encounter: Payer: Self-pay | Admitting: Radiation Oncology

## 2019-08-23 ENCOUNTER — Other Ambulatory Visit: Payer: Self-pay

## 2019-08-23 ENCOUNTER — Ambulatory Visit
Admission: RE | Admit: 2019-08-23 | Discharge: 2019-08-23 | Disposition: A | Discharge status: Home / Self Care | Payer: BC Managed Care – PPO | Source: Ambulatory Visit | Attending: Radiation Oncology | Admitting: Radiation Oncology

## 2019-08-23 VITALS — BP 118/89 | HR 65 | Temp 99.1°F | Resp 20

## 2019-08-23 DIAGNOSIS — C7931 Secondary malignant neoplasm of brain: Secondary | ICD-10-CM | POA: Diagnosis not present

## 2019-08-23 DIAGNOSIS — G9389 Other specified disorders of brain: Secondary | ICD-10-CM | POA: Diagnosis not present

## 2019-08-23 DIAGNOSIS — C7949 Secondary malignant neoplasm of other parts of nervous system: Secondary | ICD-10-CM | POA: Diagnosis not present

## 2019-08-23 DIAGNOSIS — C7951 Secondary malignant neoplasm of bone: Secondary | ICD-10-CM | POA: Diagnosis not present

## 2019-08-23 DIAGNOSIS — C3490 Malignant neoplasm of unspecified part of unspecified bronchus or lung: Secondary | ICD-10-CM

## 2019-08-23 NOTE — Progress Notes (Signed)
Nurse monitoring (30 minutes) following srs treatment complete. Patient tolerated treatment well. No distress noted. Patient alert and oriented x 3. Patient denies new neurologic symptoms. Reviewed discharge instructions printed on AVS to include decadron taper with patient and her husband. Both verbalized understanding. Patient discharged home with husband. Patient in wheelchair without distress.   BP 118/89   Pulse 65   Temp 99.1 F (37.3 C)   Resp 20   SpO2 100%

## 2019-08-23 NOTE — Progress Notes (Addendum)
  Radiation Oncology         [phone]) [phone] ________________________________  Stereotactic Treatment Procedure Note  Name: [name] MRN: [id number]  Date: 08/23/2019  DOB: [birth year]  SPECIAL TREATMENT PROCEDURE    ICD-10-CM   1. Secondary malignant neoplasm of brain and spinal cord (HCC)  C79.31    C79.49   2. Lung cancer metastatic to brain [facility])  C34.90    C79.31     3D TREATMENT PLANNING AND DOSIMETRY:  The patient's radiation plan was reviewed and approved by neurosurgery and radiation oncology prior to treatment.  It showed 3-dimensional radiation distributions overlaid onto the planning CT/MRI image set.  The [facility] for the target structures as well as the organs at risk were reviewed. The documentation of the 3D plan and dosimetry are filed in the radiation oncology EMR.  NARRATIVE:  [name] was brought to the TrueBeam stereotactic radiation treatment machine and placed supine on the CT couch. The head frame was applied, and the patient was set up for stereotactic radiosurgery.  Neurosurgery was present for the set-up and delivery  SIMULATION VERIFICATION:  In the couch zero-angle position, the patient underwent Exactrac imaging using the Brainlab system with orthogonal KV images.  These were carefully aligned and repeated to confirm treatment position for each of the isocenters.  The Exactrac snap film verification was repeated at each couch angle.  PROCEDURE: [name] received stereotactic radiosurgery to the following targets: 12 brain metastasis targets were treated using 5 Rapid Arc VMAT Beams to a prescription dose of 18-20 Gy.  ExacTrac registration was performed for each couch angle.  The 100% isodose line was prescribed.  6 MV X-rays were delivered in the flattening filter free beam mode.  STEREOTACTIC TREATMENT MANAGEMENT:  Following delivery, the patient was transported to nursing in stable condition and monitored for possible acute effects.  Vital signs were  recorded BP 118/89   Pulse 65   Temp 99.1 F (37.3 C)   Resp 20   SpO2 100% . The patient tolerated treatment without significant acute effects, and was discharged to home in stable condition.    PLAN: Follow-up in one month.  ________________________________  Sheral Apley. Tammi Klippel, M.D.

## 2019-08-23 NOTE — Patient Instructions (Signed)
Congratulations you have completed SRS treatment. Please avoid strenuous activity for the next 24 hours. It is not uncommon to have a mild headache following this kind of radiation treatment. However, should you develop any new neurologic symptoms or a headache that progressively gets worse phone the radiation oncologist on call at [phone].   You are taking Decadron 4 mg three times per day. We need to begin tapering you off this medication. Starting tomorrow (08/24/2019) please take decadron as follows:  Decadron 4 mg three times per day for seven more days. Then, Decadron 4 mg two times per day for seven days. Then, Decadron 2 mg (half tablet) twice per day for seven days. Then, Decadron 2 mg (half tablet) once per day for seven days. Then, STOP.  Should your neurologic symptoms return as you taper off decadron call your nurse promptly at [phone] (Sam).

## 2019-08-23 NOTE — Op Note (Signed)
  Name: [name]  MRN: [id number]  Date: 08/23/2019   DOB: 1956-02-12  Stereotactic Radiosurgery Operative Note  PRE-OPERATIVE DIAGNOSIS:  Multiple Brain Metastases  POST-OPERATIVE DIAGNOSIS:  Multiple Brain Metastases  PROCEDURE:  Stereotactic Radiosurgery  SURGEON:  Charlie Pitter, MD  NARRATIVE: The patient underwent a radiation treatment planning session in the radiation oncology simulation suite under the care of the radiation oncology physician and physicist.  I participated closely in the radiation treatment planning afterwards. The patient underwent planning CT which was fused to 3T high resolution MRI with 1 mm axial slices.  These images were fused on the planning system.  We contoured the gross target volumes and subsequently expanded this to yield the Planning Target Volume. I actively participated in the planning process.  I helped to define and review the target contours and also the contours of the optic pathway, eyes, brainstem and selected nearby organs at risk.  All the dose constraints for critical structures were reviewed and compared to AAPM Task Group 101.  The prescription dose conformity was reviewed.  I approved the plan electronically.    Accordingly, [name] was brought to the TrueBeam stereotactic radiation treatment linac and placed in the custom immobilization mask.  The patient was aligned according to the IR fiducial markers with BrainLab Exactrac, then orthogonal x-rays were used in ExacTrac with the 6DOF robotic table and the shifts were made to align the patient  [name] received stereotactic radiosurgery uneventfully.    Lesions treated:  12   Complex lesions treated:  0 (>3.5 cm, <6mm of optic path, or within the brainstem)   The detailed description of the procedure is recorded in the radiation oncology procedure note.  I was present for the duration of the procedure.  DISPOSITION:  Following delivery, the patient was transported to nursing in  stable condition and monitored for possible acute effects to be discharged to home in stable condition with follow-up in one month.  Charlie Pitter, MD 08/23/2019 2:55 PM

## 2019-08-26 ENCOUNTER — Encounter: Payer: Self-pay | Admitting: Pharmacy Technician

## 2019-08-26 NOTE — Progress Notes (Signed)
Patient has been approved for drug assistance by [employer] for [employer]. The enrollment period is from 08/23/19-08/21/20 based on off label. First DOS covered is 08/28/19.

## 2019-08-28 ENCOUNTER — Inpatient Hospital Stay: Payer: BC Managed Care – PPO

## 2019-08-28 ENCOUNTER — Other Ambulatory Visit: Payer: Self-pay

## 2019-08-28 ENCOUNTER — Other Ambulatory Visit: Payer: Self-pay | Admitting: Hematology & Oncology

## 2019-08-28 ENCOUNTER — Inpatient Hospital Stay: Payer: BC Managed Care – PPO | Attending: Hematology & Oncology | Admitting: Family

## 2019-08-28 ENCOUNTER — Encounter: Payer: Self-pay | Admitting: Family

## 2019-08-28 VITALS — BP 132/81 | HR 56 | Temp 97.9°F | Resp 18 | Wt 104.5 lb

## 2019-08-28 DIAGNOSIS — C3490 Malignant neoplasm of unspecified part of unspecified bronchus or lung: Secondary | ICD-10-CM

## 2019-08-28 DIAGNOSIS — F17210 Nicotine dependence, cigarettes, uncomplicated: Secondary | ICD-10-CM | POA: Diagnosis not present

## 2019-08-28 DIAGNOSIS — R5383 Other fatigue: Secondary | ICD-10-CM | POA: Diagnosis not present

## 2019-08-28 DIAGNOSIS — Z7901 Long term (current) use of anticoagulants: Secondary | ICD-10-CM | POA: Diagnosis not present

## 2019-08-28 DIAGNOSIS — C3411 Malignant neoplasm of upper lobe, right bronchus or lung: Secondary | ICD-10-CM | POA: Diagnosis not present

## 2019-08-28 DIAGNOSIS — Z79899 Other long term (current) drug therapy: Secondary | ICD-10-CM | POA: Diagnosis not present

## 2019-08-28 DIAGNOSIS — C7951 Secondary malignant neoplasm of bone: Secondary | ICD-10-CM | POA: Insufficient documentation

## 2019-08-28 DIAGNOSIS — M549 Dorsalgia, unspecified: Secondary | ICD-10-CM | POA: Insufficient documentation

## 2019-08-28 DIAGNOSIS — R634 Abnormal weight loss: Secondary | ICD-10-CM | POA: Diagnosis not present

## 2019-08-28 DIAGNOSIS — Z5112 Encounter for antineoplastic immunotherapy: Secondary | ICD-10-CM | POA: Diagnosis not present

## 2019-08-28 DIAGNOSIS — Z86718 Personal history of other venous thrombosis and embolism: Secondary | ICD-10-CM | POA: Insufficient documentation

## 2019-08-28 DIAGNOSIS — Z791 Long term (current) use of non-steroidal anti-inflammatories (NSAID): Secondary | ICD-10-CM | POA: Insufficient documentation

## 2019-08-28 DIAGNOSIS — E032 Hypothyroidism due to medicaments and other exogenous substances: Secondary | ICD-10-CM

## 2019-08-28 DIAGNOSIS — R112 Nausea with vomiting, unspecified: Secondary | ICD-10-CM | POA: Diagnosis not present

## 2019-08-28 DIAGNOSIS — F418 Other specified anxiety disorders: Secondary | ICD-10-CM | POA: Diagnosis not present

## 2019-08-28 DIAGNOSIS — Z7952 Long term (current) use of systemic steroids: Secondary | ICD-10-CM | POA: Insufficient documentation

## 2019-08-28 DIAGNOSIS — C7931 Secondary malignant neoplasm of brain: Secondary | ICD-10-CM

## 2019-08-28 DIAGNOSIS — Z8673 Personal history of transient ischemic attack (TIA), and cerebral infarction without residual deficits: Secondary | ICD-10-CM | POA: Insufficient documentation

## 2019-08-28 DIAGNOSIS — C3491 Malignant neoplasm of unspecified part of right bronchus or lung: Secondary | ICD-10-CM

## 2019-08-28 DIAGNOSIS — T451X5A Adverse effect of antineoplastic and immunosuppressive drugs, initial encounter: Secondary | ICD-10-CM

## 2019-08-28 LAB — CMP (CANCER CENTER ONLY)
ALT: 11 U/L (ref 0–44)
AST: 10 U/L — ABNORMAL LOW (ref 15–41)
Albumin: 4.2 g/dL (ref 3.5–5.0)
Alkaline Phosphatase: 83 U/L (ref 38–126)
Anion gap: 9 (ref 5–15)
BUN: 26 mg/dL — ABNORMAL HIGH (ref 8–23)
CO2: 26 mmol/L (ref 22–32)
Calcium: 9.6 mg/dL (ref 8.9–10.3)
Chloride: 99 mmol/L (ref 98–111)
Creatinine: 0.62 mg/dL (ref 0.44–1.00)
GFR, Est AFR Am: >60 mL/min (ref >60)
GFR, Estimated: >60 mL/min (ref >60)
Glucose, Bld: 97 mg/dL (ref 70–99)
Potassium: 3.9 mmol/L (ref 3.5–5.1)
Sodium: 134 mmol/L — ABNORMAL LOW (ref 135–145)
Total Bilirubin: 0.5 mg/dL (ref 0.3–1.2)
Total Protein: 6.6 g/dL (ref 6.5–8.1)

## 2019-08-28 LAB — CBC WITH DIFFERENTIAL (CANCER CENTER ONLY)
Abs Immature Granulocytes: 0.06 10*3/uL (ref 0.00–0.07)
Basophils Absolute: 0 10*3/uL (ref 0.0–0.1)
Basophils Relative: 0 %
Eosinophils Absolute: 0 10*3/uL (ref 0.0–0.5)
Eosinophils Relative: 0 %
HCT: 41.9 % (ref 36.0–46.0)
Hemoglobin: 14.3 g/dL (ref 12.0–15.0)
Immature Granulocytes: 1 %
Lymphocytes Relative: 9 %
Lymphs Abs: 0.9 10*3/uL (ref 0.7–4.0)
MCH: 31.3 pg (ref 26.0–34.0)
MCHC: 34.1 g/dL (ref 30.0–36.0)
MCV: 91.7 fL (ref 80.0–100.0)
Monocytes Absolute: 0.7 10*3/uL (ref 0.1–1.0)
Monocytes Relative: 7 %
Neutro Abs: 8.8 10*3/uL — ABNORMAL HIGH (ref 1.7–7.7)
Neutrophils Relative %: 83 %
Platelet Count: 300 10*3/uL (ref 150–400)
RBC: 4.57 MIL/uL (ref 3.87–5.11)
RDW: 12.5 % (ref 11.5–15.5)
WBC Count: 10.5 10*3/uL (ref 4.0–10.5)
nRBC: 0 % (ref 0.0–0.2)

## 2019-08-28 LAB — LACTATE DEHYDROGENASE: LDH: 178 U/L (ref 98–192)

## 2019-08-28 MED ORDER — OLANZAPINE 10 MG PO TABS: 10.0000 mg | ORAL_TABLET | Freq: Every day | ORAL | 3 refills | Status: DC

## 2019-08-28 MED ORDER — SODIUM CHLORIDE 0.9% FLUSH
10.0000 mL | INTRAVENOUS | Status: DC | PRN
  Administered 2019-08-28: 10 mL
  Filled 2019-08-28: qty 10

## 2019-08-28 MED ORDER — SODIUM CHLORIDE 0.9 % IV SOLN
200.0000 mg | Freq: Once | INTRAVENOUS | Status: AC
  Administered 2019-08-28: 200 mg via INTRAVENOUS
  Filled 2019-08-28: qty 8

## 2019-08-28 MED ORDER — SODIUM CHLORIDE 0.9% FLUSH
10.0000 mL | Freq: Once | INTRAVENOUS | Status: AC
  Administered 2019-08-28: 10 mL
  Filled 2019-08-28: qty 10

## 2019-08-28 MED ORDER — SODIUM CHLORIDE 0.9% FLUSH
10.0000 mL | Freq: Once | INTRAVENOUS | Status: DC
  Filled 2019-08-28: qty 10

## 2019-08-28 MED ORDER — HEPARIN SOD (PORK) LOCK FLUSH 100 UNIT/ML IV SOLN
500.0000 [IU] | Freq: Once | INTRAVENOUS | Status: AC | PRN
  Administered 2019-08-28: 500 [IU]
  Filled 2019-08-28: qty 5

## 2019-08-28 MED ORDER — SODIUM CHLORIDE 0.9 % IV SOLN
Freq: Once | INTRAVENOUS | Status: DC
  Filled 2019-08-28: qty 250

## 2019-08-28 MED ORDER — SODIUM CHLORIDE 0.9 % IV SOLN
Freq: Once | INTRAVENOUS | Status: AC
  Filled 2019-08-28: qty 250

## 2019-08-28 NOTE — Patient Instructions (Signed)
Pembrolizumab injection What is this medicine? PEMBROLIZUMAB (pem broe liz ue mab) is a monoclonal antibody. It is used to treat certain types of cancer. This medicine may be used for other purposes; ask your health care provider or pharmacist if you have questions. COMMON BRAND NAME(S): Keytruda What should I tell my health care provider before I take this medicine? They need to know if you have any of these conditions:  diabetes  immune system problems  inflammatory bowel disease  liver disease  lung or breathing disease  lupus  received or scheduled to receive an organ transplant or a stem-cell transplant that uses donor stem cells  an unusual or allergic reaction to pembrolizumab, other medicines, foods, dyes, or preservatives  pregnant or trying to get pregnant  breast-feeding How should I use this medicine? This medicine is for infusion into a vein. It is given by a health care professional in a hospital or clinic setting. A special MedGuide will be given to you before each treatment. Be sure to read this information carefully each time. Talk to your pediatrician regarding the use of this medicine in children. While this drug may be prescribed for children as young as 6 months for selected conditions, precautions do apply. Overdosage: If you think you have taken too much of this medicine contact a poison control center or emergency room at once. NOTE: This medicine is only for you. Do not share this medicine with others. What if I miss a dose? It is important not to miss your dose. Call your doctor or health care professional if you are unable to keep an appointment. What may interact with this medicine? Interactions have not been studied. Give your health care provider a list of all the medicines, herbs, non-prescription drugs, or dietary supplements you use. Also tell them if you smoke, drink alcohol, or use illegal drugs. Some items may interact with your medicine. This  list may not describe all possible interactions. Give your health care provider a list of all the medicines, herbs, non-prescription drugs, or dietary supplements you use. Also tell them if you smoke, drink alcohol, or use illegal drugs. Some items may interact with your medicine. What should I watch for while using this medicine? Your condition will be monitored carefully while you are receiving this medicine. You may need blood work done while you are taking this medicine. Do not become pregnant while taking this medicine or for 4 months after stopping it. Women should inform their doctor if they wish to become pregnant or think they might be pregnant. There is a potential for serious side effects to an unborn child. Talk to your health care professional or pharmacist for more information. Do not breast-feed an infant while taking this medicine or for 4 months after the last dose. What side effects may I notice from receiving this medicine? Side effects that you should report to your doctor or health care professional as soon as possible:  allergic reactions like skin rash, itching or hives, swelling of the face, lips, or tongue  bloody or black, tarry  breathing problems  changes in vision  chest pain  chills  confusion  constipation  cough  diarrhea  dizziness or feeling faint or lightheaded  fast or irregular heartbeat  fever  flushing  joint pain  low blood counts - this medicine may decrease the number of white blood cells, red blood cells and platelets. You may be at increased risk for infections and bleeding.  muscle pain  muscle   weakness  pain, tingling, numbness in the hands or feet  persistent headache  redness, blistering, peeling or loosening of the skin, including inside the mouth  signs and symptoms of high blood sugar such as dizziness; dry mouth; dry skin; fruity breath; nausea; stomach pain; increased hunger or thirst; increased urination  signs  and symptoms of kidney injury like trouble passing urine or change in the amount of urine  signs and symptoms of liver injury like dark urine, light-colored stools, loss of appetite, nausea, right upper belly pain, yellowing of the eyes or skin  sweating  swollen lymph nodes  weight loss Side effects that usually do not require medical attention (report to your doctor or health care professional if they continue or are bothersome):  decreased appetite  hair loss  muscle pain  tiredness This list may not describe all possible side effects. Call your doctor for medical advice about side effects. You may report side effects to FDA at 1-800-FDA-1088. Where should I keep my medicine? This drug is given in a hospital or clinic and will not be stored at home. NOTE: This sheet is a summary. It may not cover all possible information. If you have questions about this medicine, talk to your doctor, pharmacist, or health care provider.  2020 Elsevier/Gold Standard (2019-02-15 18:07:58)  

## 2019-08-28 NOTE — Progress Notes (Signed)
Hematology and Oncology Follow Up Visit  [name] [id number] Sep 01, 1955 64 y.o. 08/28/2019   Principle Diagnosis:  Metastatic adenocarcinoma of the lung - brain and bone metastasis -- NO actionable mutations DVT of the right lower extremity  Current Therapy:        Carboplatinum/Alimta/pembrolizumab- started on 1/14/202, s/p cycle 4 -- Carbo/Alimta on hold Xgeva 120 mg IM q 3 month -- next dose 10/2019 Pradaxa 150 mg PO BID   Interim History:  [name] is here today for follow-up and treatment. She is still having a good pit of nausea and occasional vomiting. This may be residual from recent [facility]. She has been alternating Zofran and Phenergan.  She has not been taking her Protonix.  She is currently on a Decadron taper with Dr. Tammi Klippel post [facility] treatment to 12 brain metastasis on 08/23/2019.  She has lost 10 lbs since her last visit. She is eating a lot of ice cream and drinking 1-2 Ensure daily. She states that she is taking the Marinol BID as prescribed.  She states that she is hydrating well.  She takes Ativan 1-2 times a day for anxiety and will try taking at bedtime to help her sleep better.  She is doing well on Pradaxa. No episodes of bleeding. No petechiae.  No fever, chills, cough, rash, dizziness, SOB, chest pain, palpitations, abdominal pain or changes in bowel or bladder habits.  No swelling, tenderness, numbness or tingling in her extremities at this time.  She has general fatigue and is ambulating with a cane or walker at home for added support.  No falls or syncopal episodes to report.   ECOG Performance Status: 1 - Symptomatic but completely ambulatory  Medications:  Allergies as of 08/28/2019   No Known Allergies     Medication List       Accurate as of Aug 28, 2019 10:29 AM. If you have any questions, ask your nurse or doctor.        acetaminophen 325 MG tablet Commonly known as: TYLENOL Take 2 tablets (650 mg total) by mouth every 6 (six) hours.     dabigatran 150 MG Caps capsule Commonly known as: Pradaxa Take 1 capsule (150 mg total) by mouth 2 (two) times daily.   dexamethasone 4 MG tablet Commonly known as: DECADRON Take 1 tablet (4 mg total) by mouth 3 (three) times daily.   dronabinol 5 MG capsule Commonly known as: MARINOL Take 1 capsule (5 mg total) by mouth 2 (two) times daily before lunch and supper.   enoxaparin 100 MG/ML injection Commonly known as: LOVENOX Inject 1 mL (100 mg total) into the skin daily.   feeding supplement (ENSURE ENLIVE) Liqd Take 237 mLs by mouth 2 (two) times daily between meals.   fentaNYL 75 MCG/HR Commonly known as: DURAGESIC Place 2 patches onto the skin every 3 (three) days.   folic acid 1 MG tablet Commonly known as: FOLVITE Take 1 tablet (1 mg total) by mouth daily. Start 5-7 days before Alimta chemotherapy. Continue until 21 days after Alimta completed.   gabapentin 300 MG capsule Commonly known as: NEURONTIN Take 1 capsule (300 mg total) by mouth 3 (three) times daily.   granisetron 3.1 MG/24HR Commonly known as: SANCUSO Apply to skin starting 24 hours before chemotherapy. Remove after 7 days.   hydrALAZINE 25 MG tablet Commonly known as: APRESOLINE Take 25 mg by mouth 2 (two) times daily.   ketorolac 10 MG tablet Commonly known as: TORADOL Take 1 tablet (10 mg total)  by mouth every 6 (six) hours as needed.   lactulose 10 GM/15ML solution Commonly known as: CHRONULAC Take 30 mLs (20 g total) by mouth 2 (two) times daily.   lidocaine-prilocaine cream Commonly known as: EMLA Apply to affected area once   lidocaine-prilocaine cream Commonly known as: EMLA Apply 1 application topically as needed.   lip balm ointment Apply topically as needed for lip care.   loratadine 10 MG tablet Commonly known as: CLARITIN Take 1 tablet (10 mg total) by mouth daily.   LORazepam 1 MG tablet Commonly known as: ATIVAN Take 1 tablet (1 mg total) by mouth every 6 (six) hours as  needed for anxiety.   methocarbamol 500 MG tablet Commonly known as: ROBAXIN Take 1 tablet (500 mg total) by mouth every 6 (six) hours as needed for muscle spasms.   morphine 15 MG tablet Commonly known as: MSIR Take 1 tablet (15 mg total) by mouth every 4 (four) hours as needed for severe pain.   multivitamin with minerals Tabs tablet Take 1 tablet by mouth daily.   Narcan 4 MG/0.1ML Liqd nasal spray kit Generic drug: naloxone Place 1 spray into the nose once as needed (overdose).   ondansetron 8 MG disintegrating tablet Commonly known as: Zofran ODT Take 1 tablet (8 mg total) by mouth every 8 (eight) hours as needed for nausea or vomiting.   ondansetron 8 MG tablet Commonly known as: Zofran Take 1 tablet (8 mg total) by mouth 2 (two) times daily as needed (Nausea or vomiting). Start if needed on the third day after chemotherapy.   oxymetazoline 0.05 % nasal spray Commonly known as: AFRIN Place 1 spray into both nostrils 2 (two) times daily as needed for congestion.   pantoprazole 40 MG tablet Commonly known as: PROTONIX TAKE 1 TABLET (40 MG TOTAL) BY MOUTH DAILY WITH SUPPER.   polyvinyl alcohol 1.4 % ophthalmic solution Commonly known as: LIQUIFILM TEARS Place 1 drop into both eyes as needed for dry eyes.   promethazine 12.5 MG tablet Commonly known as: PHENERGAN TAKE 1-2 TABLETS BY MOUTH EVERY 4 (FOUR) HOURS AS NEEDED FOR REFRACTORY NAUSEA / VOMITING.   senna-docusate 8.6-50 MG tablet Commonly known as: Senokot-S Take 1 tablet by mouth 2 (two) times daily.       Allergies: No Known Allergies  Past Medical History, Surgical history, Social history, and Family History were reviewed and updated.  Review of Systems: All other 10 point review of systems is negative.   Physical Exam:  vitals were not taken for this visit.   Wt Readings from Last 3 Encounters:  08/07/19 114 lb (51.7 kg)  07/10/19 114 lb 11.2 oz (52 kg)  06/18/19 116 lb 9.6 oz (52.9 kg)     Ocular: Sclerae unicteric, pupils equal, round and reactive to light Ear-nose-throat: Oropharynx clear, dentition fair Lymphatic: No cervical or supraclavicular adenopathy Lungs no rales or rhonchi, good excursion bilaterally Heart regular rate and rhythm, no murmur appreciated Abd soft, nontender, positive bowel sounds, no liver or spleen tip palpated on exam, no fluid wave  MSK no focal spinal tenderness, no joint edema Neuro: non-focal, well-oriented, appropriate affect Breasts: Deferred   Lab Results  Component Value Date   WBC 10.5 08/28/2019   HGB 14.3 08/28/2019   HCT 41.9 08/28/2019   MCV 91.7 08/28/2019   PLT 300 08/28/2019   Lab Results  Component Value Date   FERRITIN 1,089 (H) 05/23/2019   IRON 31 (L) 05/23/2019   TIBC 155 (L) 05/23/2019  UIBC 124 05/23/2019   IRONPCTSAT 20 (L) 05/23/2019   Lab Results  Component Value Date   RBC 4.57 08/28/2019   No results found for: KPAFRELGTCHN, LAMBDASER, KAPLAMBRATIO No results found for: IGGSERUM, IGA, IGMSERUM No results found for: Ronnald Ramp, A1GS, A2GS, Tillman Sers, SPEI   Chemistry      Component Value Date/Time   NA 137 08/06/2019 1320   K 4.2 08/06/2019 1320   CL 102 08/06/2019 1320   CO2 27 08/06/2019 1320   BUN 13 08/06/2019 1320   CREATININE 0.56 08/06/2019 1320      Component Value Date/Time   CALCIUM 9.8 08/06/2019 1320   ALKPHOS 91 08/06/2019 1320   AST 17 08/06/2019 1320   ALT 14 08/06/2019 1320   BILITOT 0.3 08/06/2019 1320       Impression and Plan: [name] is a very pleasant 64 yo female with metastatic adenocarcinoma of the right lung.  She is really having a hard time with persistent nausea and weight loss.  I spoke with Dr. Marin Olp and we will have her try Zyprexa 10 mg PO daily for nausea.  She will also restart her Protonix.  We will proceed with treatment today as planned.  We will plan to repeat scans in June or July.  We will see her back in  another 3 weeks.  She will contact our office with any questions or concerns. We can certainly see her sooner if needed.   Laverna Peace, NP 5/5/202110:29 AM

## 2019-08-29 ENCOUNTER — Other Ambulatory Visit: Payer: Self-pay | Admitting: Radiation Oncology

## 2019-08-29 LAB — T4: T4, Total: 7.4 ug/dL (ref 4.5–12.0)

## 2019-08-29 LAB — TSH: TSH: 1.649 u[IU]/mL (ref 0.308–3.960)

## 2019-08-29 MED ORDER — DEXAMETHASONE 4 MG PO TABS
4.0000 mg | ORAL_TABLET | Freq: Every day | ORAL | 0 refills | Status: DC
Start: 2019-08-29 — End: 2019-09-10

## 2019-08-31 ENCOUNTER — Other Ambulatory Visit: Payer: Self-pay | Admitting: Hematology & Oncology

## 2019-09-02 ENCOUNTER — Other Ambulatory Visit: Payer: Self-pay | Admitting: *Deleted

## 2019-09-02 ENCOUNTER — Encounter: Payer: Self-pay | Admitting: *Deleted

## 2019-09-02 ENCOUNTER — Encounter: Payer: Self-pay | Admitting: Hematology & Oncology

## 2019-09-02 ENCOUNTER — Encounter: Payer: Self-pay | Admitting: Urology

## 2019-09-02 ENCOUNTER — Telehealth: Payer: Self-pay | Admitting: *Deleted

## 2019-09-02 ENCOUNTER — Telehealth (INDEPENDENT_AMBULATORY_CARE_PROVIDER_SITE_OTHER): Payer: BC Managed Care – PPO | Admitting: Physician Assistant

## 2019-09-02 VITALS — BP 117/84 | HR 74 | Ht 67.0 in | Wt 110.0 lb

## 2019-09-02 DIAGNOSIS — I313 Pericardial effusion (noninflammatory): Secondary | ICD-10-CM

## 2019-09-02 DIAGNOSIS — C3490 Malignant neoplasm of unspecified part of unspecified bronchus or lung: Secondary | ICD-10-CM

## 2019-09-02 DIAGNOSIS — I10 Essential (primary) hypertension: Secondary | ICD-10-CM

## 2019-09-02 DIAGNOSIS — I82461 Acute embolism and thrombosis of right calf muscular vein: Secondary | ICD-10-CM

## 2019-09-02 DIAGNOSIS — C7931 Secondary malignant neoplasm of brain: Secondary | ICD-10-CM

## 2019-09-02 DIAGNOSIS — C7951 Secondary malignant neoplasm of bone: Secondary | ICD-10-CM

## 2019-09-02 DIAGNOSIS — R112 Nausea with vomiting, unspecified: Secondary | ICD-10-CM

## 2019-09-02 DIAGNOSIS — M25551 Pain in right hip: Secondary | ICD-10-CM

## 2019-09-02 DIAGNOSIS — F419 Anxiety disorder, unspecified: Secondary | ICD-10-CM

## 2019-09-02 DIAGNOSIS — C3491 Malignant neoplasm of unspecified part of right bronchus or lung: Secondary | ICD-10-CM

## 2019-09-02 DIAGNOSIS — I3139 Other pericardial effusion (noninflammatory): Secondary | ICD-10-CM

## 2019-09-02 DIAGNOSIS — D649 Anemia, unspecified: Secondary | ICD-10-CM

## 2019-09-02 DIAGNOSIS — T451X5A Adverse effect of antineoplastic and immunosuppressive drugs, initial encounter: Secondary | ICD-10-CM

## 2019-09-02 MED ORDER — MORPHINE SULFATE 15 MG PO TABS: 15.0000 mg | ORAL_TABLET | ORAL | 0 refills | Status: DC | PRN

## 2019-09-02 MED ORDER — DRONABINOL 5 MG PO CAPS: 5.0000 mg | ORAL_CAPSULE | Freq: Two times a day (BID) | ORAL | 0 refills | Status: AC

## 2019-09-02 MED ORDER — FENTANYL 75 MCG/HR TD PT72: 2.0000 | MEDICATED_PATCH | TRANSDERMAL | 0 refills | Status: DC

## 2019-09-02 MED ORDER — LORAZEPAM 1 MG PO TABS: 1.0000 mg | ORAL_TABLET | Freq: Four times a day (QID) | ORAL | 0 refills | Status: DC | PRN

## 2019-09-02 NOTE — Patient Instructions (Signed)
Your physician recommends that you continue on your current medications as directed. Please refer to the Current Medication list given to you today.   Your physician wants you to follow-up in: 6 MONTHS WITH DR HILTY   You will receive a reminder letter in the mail two months in advance. If you don't receive a letter, please call our office to schedule the follow-up appointment.  

## 2019-09-02 NOTE — Telephone Encounter (Signed)
Received call from [facility] in [place] stating that Fentanyl will not be covered by insurance.  Financial counselor notified and will initiate Prior Auth

## 2019-09-02 NOTE — Progress Notes (Signed)
pt

## 2019-09-02 NOTE — Telephone Encounter (Signed)
Received call from [facility] in [facility] stating that they are unable to provide PT services due to staffing shortage.  Message sent to patient husband asking if he wants an external referral sent within our building

## 2019-09-02 NOTE — Progress Notes (Signed)
Virtual Visit via Video Note   This visit type was conducted due to national recommendations for restrictions regarding the COVID-19 Pandemic (e.g. social distancing) in an effort to limit this patient's exposure and mitigate transmission in our community.  Due to her co-morbid illnesses, this patient is at least at moderate risk for complications without adequate follow up.  This format is felt to be most appropriate for this patient at this time.  All issues noted in this document were discussed and addressed.  A limited physical exam was performed with this format.  Please refer to the patient's chart for her consent to telehealth for [facility].   The patient was identified using 2 identifiers.  Date:  09/04/2019   ID:  [name], DOB 03-Aug-1955, MRN [id number]  Patient Location: Home Provider Location: Home  PCP:  [name], MD  Cardiologist:  Pixie Casino, MD  Electrophysiologist:  None   Evaluation Performed:  Follow-Up Visit  Chief Complaint:  followup  History of Present Illness:    [name] is a 64 y.o. female with past medical history of hypertension, lung cancer with metastasis to the brain and DVT/PE.  Previous Myoview in September 2014 was negative for ischemia, EF was normal.  Over the years, she is mainly follow-up with cardiology service to help manage blood pressure.  She was last seen by Dr. Debara Pickett via virtual visit on 09/05/2018 at which time she was concerned about intermittent explosive episodes and outbursts of personality.  She was placed on low-dose Prozac.  Patient was admitted in December 2020 with 2 to 3 weeks onset of right-sided weakness progressing to foot drop and also chest wall pain.  CT of the head performed revealing left frontal lobe vasogenic edema concerning for metastatic disease.  MRI of the brain revealed a solitary left frontal lobe mass with moderate vasogenic edema and extensive small vessel disease.  She was started on Keppra and  IV steroid therapy.  CT of the chest abdomen pelvis also revealed a 3 x 2.4 x 3.6 speculated mass in the medial right upper lobe compatible with primary bronchogenic neoplasm.  He has had additional bilateral pulmonary metastasis to the left upper lobe, bilateral lower lobe and ribs.  He underwent left craniotomy with resection of the tumor on 04/05/2019 by Dr. Trenton Gammon.  Subsequent pathology was positive for adenocarcinoma.  Unfortunately, hospitalization was complicated by the development of right lower extremity DVT after the procedure.  She was initially placed on subcu Lovenox which later transitioned to Eliquis prior to discharge.  Since discharge, patient has been treated with paliative chemotherapy/radiation and followed by Dr. Marin Olp.  More recently, patient was readmitted in January 2021 with complaint of increased urinary frequency.  On arrival, her white blood cell count was elevated at 14.6.  Hemoglobin low at 8.6.  Heart rate was tachycardic up to 110.  Urinalysis showed moderate hemoglobin.  CT of abdomen pelvis revealed signs of right-sided pyelonephritis and cystitis.  CT angiogram of the chest showed a small left-sided PE with bilateral pleural effusion.  She was treated with IV antibiotics during this admission.  At the recommendation of Dr. Marin Olp, her Eliquis was discontinued and transition to Lovenox as outpatient.  Recent echocardiogram obtained on 05/13/2019 showed EF 60 to 65%, small anterior pericardial effusion, overall very limited study.  Since discharge, she had a CT angiogram performed in April that showed no recurrent PE.  Repeat Doppler of her leg shows there is still some occlusive disease in her  right lower extremity.  Dr. Marin Olp switched her Lovenox to Pradaxa.   Patient presents today for MyChart video visit.  Her anemia has completely resolved.  She is able to move around at home.  Nausea and vomiting with the chemotherapy is intermittent at this time.  Otherwise she denies any  chest pain or shortness of breath.  She has no lower extremity edema, orthopnea or PND.  I recommend a 11-monthfollow-up with Dr. HDebara Pickett  Even though she has very mild anterior pericardial effusion in January, she has not had any symptoms to suggest enlarging pericardial effusion, therefore I did not try to repeat echocardiogram at this time.  She can follow-up with Dr. HDebara Pickettin 6 months.  The patient does not have symptoms concerning for COVID-19 infection (fever, chills, cough, or new shortness of breath).    Past Medical History:  Diagnosis Date  . Bronchitis   . Goals of care, counseling/discussion 04/24/2019  . Hypertension   . Lung cancer ([facility]    with painful bone mets  . Osteoarthritis of metacarpophalangeal (MCP) joint of left thumb   . Pelvic ring fracture ([facility] 09/2016   left   Past Surgical History:  Procedure Laterality Date  . APPLICATION OF CRANIAL NAVIGATION Left 04/05/2019   Procedure: APPLICATION OF CRANIAL NAVIGATION;  Surgeon: PEarnie Larsson MD;  [facility]  Service: Neurosurgery;  Laterality: Left;  . CRANIOTOMY Left 04/05/2019   Procedure: LEFT FRONTAL CRANIOTOMY TUMOR EXCISION w/Brain Lab;  Surgeon: PEarnie Larsson MD;  [facility]  Service: Neurosurgery;  Laterality: Left;  LEFT FRONTAL CRANIOTOMY TUMOR EXCISION w/Brain Lab  . IR IMAGING GUIDED PORT INSERTION  05/08/2019     Current Meds  Medication Sig  . acetaminophen (TYLENOL) 325 MG tablet Take 2 tablets (650 mg total) by mouth every 6 (six) hours.  . dabigatran (PRADAXA) 150 MG CAPS capsule Take 1 capsule (150 mg total) by mouth 2 (two) times daily.  .Marland Kitchendexamethasone (DECADRON) 4 MG tablet Take 1 tablet (4 mg total) by mouth daily. Follow taper instructions given.  . dronabinol (MARINOL) 5 MG capsule Take 1 capsule (5 mg total) by mouth 2 (two) times daily before lunch and supper.  . feeding supplement, ENSURE ENLIVE, (ENSURE ENLIVE) LIQD Take 237 mLs by mouth 2 (two) times daily between meals.  .  fentaNYL (DURAGESIC) 75 MCG/HR Place 2 patches onto the skin every 3 (three) days.  . folic acid (FOLVITE) 1 MG tablet Take 1 tablet (1 mg total) by mouth daily. Start 5-7 days before Alimta chemotherapy. Continue until 21 days after Alimta completed.  . gabapentin (NEURONTIN) 300 MG capsule Take 1 capsule (300 mg total) by mouth 3 (three) times daily.  .Marland Kitchengranisetron (SANCUSO) 3.1 MG/24HR Apply to skin starting 24 hours before chemotherapy. Remove after 7 days.  . hydrALAZINE (APRESOLINE) 25 MG tablet Take 25 mg by mouth 2 (two) times daily.  .Marland Kitchenlactulose (CHRONULAC) 10 GM/15ML solution Take 30 mLs (20 g total) by mouth 2 (two) times daily.  .Marland Kitchenlip balm (CARMEX) ointment Apply topically as needed for lip care.  . loratadine (CLARITIN) 10 MG tablet Take 1 tablet (10 mg total) by mouth daily.  .Marland KitchenLORazepam (ATIVAN) 1 MG tablet Take 1 tablet (1 mg total) by mouth every 6 (six) hours as needed for anxiety.  . methocarbamol (ROBAXIN) 500 MG tablet Take 1 tablet (500 mg total) by mouth every 6 (six) hours as needed for muscle spasms.  .Marland Kitchenmorphine (MSIR) 15 MG tablet Take 1 tablet (  15 mg total) by mouth every 4 (four) hours as needed for severe pain.  . Multiple Vitamin (MULTIVITAMIN WITH MINERALS) TABS tablet Take 1 tablet by mouth daily.  Marland Kitchen NARCAN 4 MG/0.1ML LIQD nasal spray kit Place 1 spray into the nose once as needed (overdose).   Marland Kitchen OLANZapine (ZYPREXA) 10 MG tablet Take 1 tablet (10 mg total) by mouth at bedtime.  . ondansetron (ZOFRAN ODT) 8 MG disintegrating tablet Take 1 tablet (8 mg total) by mouth every 8 (eight) hours as needed for nausea or vomiting.  . ondansetron (ZOFRAN) 8 MG tablet Take 1 tablet (8 mg total) by mouth 2 (two) times daily as needed (Nausea or vomiting). Start if needed on the third day after chemotherapy.  Marland Kitchen oxymetazoline (AFRIN) 0.05 % nasal spray Place 1 spray into both nostrils 2 (two) times daily as needed for congestion.  . pantoprazole (PROTONIX) 40 MG tablet TAKE 1  TABLET (40 MG TOTAL) BY MOUTH DAILY WITH SUPPER.  . polyvinyl alcohol (LIQUIFILM TEARS) 1.4 % ophthalmic solution Place 1 drop into both eyes as needed for dry eyes.  . promethazine (PHENERGAN) 12.5 MG tablet TAKE 1-2 TABLETS BY MOUTH EVERY 4 (FOUR) HOURS AS NEEDED FOR REFRACTORY NAUSEA / VOMITING.  . senna-docusate (SENOKOT-S) 8.6-50 MG tablet Take 1 tablet by mouth 2 (two) times daily.     Allergies:   Patient has no known allergies.   Social History   Tobacco Use  . Smoking status: Former Smoker    Packs/day: 0.50    Years: 25.00    Pack years: 12.50    Types: Cigarettes    Quit date: 06/11/2019    Years since quitting: 0.2  . Smokeless tobacco: Never Used  Substance Use Topics  . Alcohol use: No    Alcohol/week: 0.0 standard drinks  . Drug use: Yes    Types: Marijuana    Comment: when needed     Family Hx: The patient's family history includes CAD in her father; Lymphoma in her brother; Stroke in her father. There is no history of Breast cancer, Pancreatic cancer, Prostate cancer, or Colon cancer.  ROS:   Please see the history of present illness.     All other systems reviewed and are negative.   Prior CV studies:   The following studies were reviewed today:   Echo 05/13/2019 1. Only 2 images available, extremely limited, patient refused further  imaging. Normal LV size and thickness. Grossly normal LV function, EF  looks like 60-65% from parasternal long axis. Very limited RV view appears  to show normal systolic function.  Small anterior pericardial effusion.   Labs/Other Tests and Data Reviewed:    EKG:  An ECG dated 05/13/2019 was personally reviewed today and demonstrated:  Normal sinus rhythm without significant ST-T wave changes  Recent Labs: 08/28/2019: ALT 11; BUN 26; Creatinine 0.62; Hemoglobin 14.3; Platelet Count 300; Potassium 3.9; Sodium 134; TSH 1.649   Recent Lipid Panel Lab Results  Component Value Date/Time   CHOL 266 (HH) 05/29/2008 11:15 AM    TRIG 70 05/29/2008 11:15 AM   HDL 120.2 05/29/2008 11:15 AM   CHOLHDL 2.2 CALC 05/29/2008 11:15 AM   LDLDIRECT 130.1 05/29/2008 11:15 AM    Wt Readings from Last 3 Encounters:  09/02/19 110 lb (49.9 kg)  08/28/19 104 lb 8 oz (47.4 kg)  08/07/19 114 lb (51.7 kg)     Objective:    Vital Signs:  BP 117/84   Pulse 74   Ht '5\' 7"'  (1.702  m)   Wt 110 lb (49.9 kg)   BMI 17.23 kg/m    VITAL SIGNS:  reviewed  ASSESSMENT & PLAN:    1. Pericardial effusion: Small degree of pericardial effusion noted on previous echocardiogram in January 2021.  She is not really having any symptom associated with enlarging pericardial effusion.  She does not have any heart failure symptoms.  At this time I did not recommend repeat echocardiogram.  2. Metastatic lung cancer: Undergoing palliative chemotherapy.  Having intermittent nausea and vomiting  3. Acute DVT/PE: He is currently on Pradaxa followed by Dr. Marin Olp.  Unfortunately having metastatic lung cancer does place her at hypercoagulable state  4. Hypertension: Blood pressure stable  5. Anemia: Resolved on recent lab work  COVID-19 Education: The signs and symptoms of COVID-19 were discussed with the patient and how to seek care for testing (follow up with PCP or arrange E-visit).  The importance of social distancing was discussed today.  Time:   Today, I have spent 15 minutes with the patient with telehealth technology discussing the above problems.     Medication Adjustments/Labs and Tests Ordered: Current medicines are reviewed at length with the patient today.  Concerns regarding medicines are outlined above.   Tests Ordered: No orders of the defined types were placed in this encounter.   Medication Changes: No orders of the defined types were placed in this encounter.   Follow Up:  Either In Person or Virtual in 6 month(s)  Signed, Almyra Deforest, [place]  09/04/2019 11:39 PM    [place]

## 2019-09-03 ENCOUNTER — Telehealth: Payer: Self-pay | Admitting: Radiation Oncology

## 2019-09-03 ENCOUNTER — Other Ambulatory Visit: Payer: Self-pay | Admitting: Hematology & Oncology

## 2019-09-03 ENCOUNTER — Encounter: Payer: Self-pay | Admitting: Hematology & Oncology

## 2019-09-03 DIAGNOSIS — C3491 Malignant neoplasm of unspecified part of right bronchus or lung: Secondary | ICD-10-CM

## 2019-09-03 DIAGNOSIS — C7931 Secondary malignant neoplasm of brain: Secondary | ICD-10-CM

## 2019-09-03 DIAGNOSIS — C3490 Malignant neoplasm of unspecified part of unspecified bronchus or lung: Secondary | ICD-10-CM

## 2019-09-03 DIAGNOSIS — M25551 Pain in right hip: Secondary | ICD-10-CM

## 2019-09-03 DIAGNOSIS — T451X5A Adverse effect of antineoplastic and immunosuppressive drugs, initial encounter: Secondary | ICD-10-CM

## 2019-09-03 DIAGNOSIS — C7951 Secondary malignant neoplasm of bone: Secondary | ICD-10-CM

## 2019-09-03 DIAGNOSIS — F419 Anxiety disorder, unspecified: Secondary | ICD-10-CM

## 2019-09-03 DIAGNOSIS — R112 Nausea with vomiting, unspecified: Secondary | ICD-10-CM

## 2019-09-03 NOTE — Telephone Encounter (Signed)
Will attempt to reach patient or her husband again later.

## 2019-09-03 NOTE — Telephone Encounter (Signed)
Phoned patient's husband, [name]. He denies his wife is having headaches, dizziness, nausea, vomiting, diplopia or tinnitus. He reports her back pain is unchanged but definitely present. He reports his wife has had accidents while trying to make it to the bathroom but doesn't believe its related to incontinence. He denies any recent falls or leg weakness. He denies that she has complained of numbness or tingling in her legs. He reports on Thursday his wife will transition down to one decadron pill per day before stopping. [name] understands this RN will express his concerns about his wife be slow to move to the providers and phone him back with further directions.

## 2019-09-04 ENCOUNTER — Telehealth: Payer: Self-pay | Admitting: Radiation Oncology

## 2019-09-04 NOTE — Telephone Encounter (Signed)
-----   Message from [place], [place] sent at 09/03/2019  4:37 PM EDT ----- Regarding: Steroid dose Sam, I cannot find the details of her steroid taper but if she is currently taking two 4 mg tablets daily, let's go up to one TID for 1 week, then down to one BID x 1 week, one daily x 1 week and then 0.5 daily x 1 week and discontinue. Hopefully this will help with her sluggishness.  Also recommend that she increase fluid intake in case she is getting dehydrated which can lead to sluggishness too.  If not improved by Thursday, she may need to see Ennever to have labs and make sure she is not anemic, etc. Thank you! -Ashlyn

## 2019-09-04 NOTE — Telephone Encounter (Signed)
Phoned patient's husband. He confirms his wife has no new neurologic symptoms but continues to be sluggish. Per [employer], PA-C I instructed upon the following taper: 4mg  TID for 1 week, then down to one BID x 1 week, one daily x 1 week and then 0.5 daily x 1 week and discontinue.  Explained that hopefully this will help with her sluggishness. Also recommend that she increase fluid intake in case she is getting dehydrated which can lead to sluggishness too. If not improved by Thursday, she may need to see Ennever to have labs and make sure she is not anemic, etc. Patient's husband, [name], verbalized understanding of all reviewed.

## 2019-09-08 ENCOUNTER — Encounter: Payer: Self-pay | Admitting: Hematology & Oncology

## 2019-09-09 ENCOUNTER — Other Ambulatory Visit: Payer: Self-pay | Admitting: *Deleted

## 2019-09-09 MED ORDER — METHYLPREDNISOLONE 4 MG PO TBPK
ORAL_TABLET | ORAL | 0 refills | Status: DC
Start: 2019-09-09 — End: 2019-09-19

## 2019-09-10 ENCOUNTER — Ambulatory Visit: Payer: BC Managed Care – PPO | Admitting: Physical Therapy

## 2019-09-10 ENCOUNTER — Other Ambulatory Visit: Payer: Self-pay | Admitting: Radiation Oncology

## 2019-09-10 MED ORDER — DEXAMETHASONE 4 MG PO TABS: 4.0000 mg | ORAL_TABLET | Freq: Every day | ORAL | 1 refills | Status: DC

## 2019-09-12 ENCOUNTER — Encounter: Payer: Self-pay | Admitting: Radiation Oncology

## 2019-09-12 ENCOUNTER — Telehealth: Payer: Self-pay | Admitting: Radiation Oncology

## 2019-09-12 NOTE — Progress Notes (Signed)
Pharmacist Chemotherapy Monitoring - Follow Up Assessment    I verify that I have reviewed each item in the below checklist:  . Regimen for the patient is scheduled for the appropriate day and plan matches scheduled date. Marland Kitchen Appropriate non-routine labs are ordered dependent on drug ordered. . If applicable, additional medications reviewed and ordered per protocol based on lifetime cumulative doses and/or treatment regimen.   Plan for follow-up and/or issues identified: No . I-vent associated with next due treatment: No . MD and/or nursing notified: No  [name], Jacqlyn Larsen 09/12/2019 1:05 PM

## 2019-09-12 NOTE — Telephone Encounter (Signed)
-----   Message from Freeman Caldron, [place] sent at 09/11/2019  5:54 PM EDT ----- Regarding: RE: Thoughts I think your response is spot on. Definitely do not recommend taking Dosepak and decadron together and prefer to clarify through Dr. Marin Olp as to which one he prefers that she take. As far as the sluggishness, that could still be an effect of the radiation which can potentially linger, albeit gradually improving, over 2-3 months after radiation. If it seems to be worsening or other neurologic symptoms develop like headaches, visual or auditory changes, imbalance, word finding, etc;, they should let us know and we may need to repeat brain imaging sooner than planned. Otherwise, if fatigue is the only complaint, I would simply advise to give it time. -Ashlyn ----- Message ----- From: Heywood Footman, RN Sent: 09/11/2019   3:26 PM EDT To: Freeman Caldron, PA-C Subject: Thoughts                                       Ashlyn.  I received a patient message from Sudie Grumbling.   He questioned if his wife should take both the decadron (we prescribed) and the Medrol dose pack Dr. Marin Olp prescribed. Also he continues to be concerned about Lorine being sluggish.   Following was my response: Good afternoon, Mr. Wolz.   I see that Dr. Marin Olp prescribed a Medrol dose pack. I would not advise taking those together. I would phone Dr. Antonieta Pert office to clarify ensuring he is aware Mrs. Mcpeters is tapering of decadron 4mg . Decadron is stronger than prednisone but both steroids.   I confirmed that on 09/10/2019 at [address] [facility] [place] at [facility] received the decadron refill sent in by [employer], PA-C.   Sam

## 2019-09-12 NOTE — Telephone Encounter (Signed)
Phoned patient's husband to check in. No answer. Left detailed message explaining the following: As far as the sluggishness, that could still be an effect of the radiation which can potentially linger, albeit gradually improving, over 2-3 months after radiation. If it seems to be worsening or other neurologic symptoms develop like headaches, visual or auditory changes, imbalance, word finding, etc;, they should let us know and we may need to repeat brain imaging sooner than planned. Otherwise, if fatigue is the only complaint, I would simply advise to give it time. Also, stressed again the medrol dosepak and decadron shouldn't be taken together. Explained that part of the reasoning for my call was to inquire about Dr. Antonieta Pert direction was reference the steroids. Provided my direct number for callback and will send a mychart message as well.

## 2019-09-16 ENCOUNTER — Encounter: Payer: Self-pay | Admitting: Hematology & Oncology

## 2019-09-18 ENCOUNTER — Encounter: Payer: Self-pay | Admitting: Urology

## 2019-09-18 ENCOUNTER — Telehealth: Payer: Self-pay

## 2019-09-18 NOTE — Telephone Encounter (Signed)
Appointment reminder for 09/19/19 patient verbalized understood this is a telephone encounter meaningful use complete

## 2019-09-19 ENCOUNTER — Inpatient Hospital Stay (HOSPITAL_BASED_OUTPATIENT_CLINIC_OR_DEPARTMENT_OTHER): Payer: BC Managed Care – PPO | Admitting: Hematology & Oncology

## 2019-09-19 ENCOUNTER — Other Ambulatory Visit: Payer: Self-pay

## 2019-09-19 ENCOUNTER — Inpatient Hospital Stay: Payer: BC Managed Care – PPO

## 2019-09-19 ENCOUNTER — Encounter: Payer: Self-pay | Admitting: Hematology & Oncology

## 2019-09-19 ENCOUNTER — Encounter: Payer: Self-pay | Admitting: *Deleted

## 2019-09-19 ENCOUNTER — Telehealth: Payer: Self-pay | Admitting: Hematology & Oncology

## 2019-09-19 ENCOUNTER — Ambulatory Visit
Admission: RE | Admit: 2019-09-19 | Discharge: 2019-09-19 | Disposition: A | Discharge status: Home / Self Care | Payer: BC Managed Care – PPO | Source: Ambulatory Visit | Attending: Radiation Oncology | Admitting: Radiation Oncology

## 2019-09-19 VITALS — BP 160/68 | HR 77 | Temp 97.7°F | Resp 18

## 2019-09-19 DIAGNOSIS — R112 Nausea with vomiting, unspecified: Secondary | ICD-10-CM

## 2019-09-19 DIAGNOSIS — R5383 Other fatigue: Secondary | ICD-10-CM | POA: Diagnosis not present

## 2019-09-19 DIAGNOSIS — F418 Other specified anxiety disorders: Secondary | ICD-10-CM | POA: Diagnosis not present

## 2019-09-19 DIAGNOSIS — C7949 Secondary malignant neoplasm of other parts of nervous system: Secondary | ICD-10-CM | POA: Insufficient documentation

## 2019-09-19 DIAGNOSIS — Z5112 Encounter for antineoplastic immunotherapy: Secondary | ICD-10-CM | POA: Diagnosis not present

## 2019-09-19 DIAGNOSIS — C7951 Secondary malignant neoplasm of bone: Secondary | ICD-10-CM | POA: Diagnosis not present

## 2019-09-19 DIAGNOSIS — Z79899 Other long term (current) drug therapy: Secondary | ICD-10-CM | POA: Diagnosis not present

## 2019-09-19 DIAGNOSIS — E032 Hypothyroidism due to medicaments and other exogenous substances: Secondary | ICD-10-CM

## 2019-09-19 DIAGNOSIS — Z791 Long term (current) use of non-steroidal anti-inflammatories (NSAID): Secondary | ICD-10-CM | POA: Diagnosis not present

## 2019-09-19 DIAGNOSIS — R634 Abnormal weight loss: Secondary | ICD-10-CM | POA: Diagnosis not present

## 2019-09-19 DIAGNOSIS — C7931 Secondary malignant neoplasm of brain: Secondary | ICD-10-CM

## 2019-09-19 DIAGNOSIS — Z8673 Personal history of transient ischemic attack (TIA), and cerebral infarction without residual deficits: Secondary | ICD-10-CM | POA: Diagnosis not present

## 2019-09-19 DIAGNOSIS — C3491 Malignant neoplasm of unspecified part of right bronchus or lung: Secondary | ICD-10-CM

## 2019-09-19 DIAGNOSIS — C3490 Malignant neoplasm of unspecified part of unspecified bronchus or lung: Secondary | ICD-10-CM

## 2019-09-19 DIAGNOSIS — T451X5A Adverse effect of antineoplastic and immunosuppressive drugs, initial encounter: Secondary | ICD-10-CM

## 2019-09-19 DIAGNOSIS — Z7901 Long term (current) use of anticoagulants: Secondary | ICD-10-CM | POA: Diagnosis not present

## 2019-09-19 DIAGNOSIS — M549 Dorsalgia, unspecified: Secondary | ICD-10-CM | POA: Diagnosis not present

## 2019-09-19 DIAGNOSIS — G9389 Other specified disorders of brain: Secondary | ICD-10-CM | POA: Insufficient documentation

## 2019-09-19 DIAGNOSIS — F17210 Nicotine dependence, cigarettes, uncomplicated: Secondary | ICD-10-CM | POA: Diagnosis not present

## 2019-09-19 DIAGNOSIS — Z95828 Presence of other vascular implants and grafts: Secondary | ICD-10-CM

## 2019-09-19 DIAGNOSIS — Z86718 Personal history of other venous thrombosis and embolism: Secondary | ICD-10-CM | POA: Diagnosis not present

## 2019-09-19 DIAGNOSIS — M25551 Pain in right hip: Secondary | ICD-10-CM

## 2019-09-19 DIAGNOSIS — F419 Anxiety disorder, unspecified: Secondary | ICD-10-CM

## 2019-09-19 DIAGNOSIS — C3411 Malignant neoplasm of upper lobe, right bronchus or lung: Secondary | ICD-10-CM | POA: Diagnosis not present

## 2019-09-19 DIAGNOSIS — Z7952 Long term (current) use of systemic steroids: Secondary | ICD-10-CM | POA: Diagnosis not present

## 2019-09-19 LAB — CMP (CANCER CENTER ONLY)
ALT: 17 U/L (ref 0–44)
AST: 13 U/L — ABNORMAL LOW (ref 15–41)
Albumin: 3.8 g/dL (ref 3.5–5.0)
Alkaline Phosphatase: 85 U/L (ref 38–126)
Anion gap: 6 (ref 5–15)
BUN: 15 mg/dL (ref 8–23)
CO2: 26 mmol/L (ref 22–32)
Calcium: 8.9 mg/dL (ref 8.9–10.3)
Chloride: 104 mmol/L (ref 98–111)
Creatinine: 0.48 mg/dL (ref 0.44–1.00)
GFR, Est AFR Am: >60 mL/min (ref >60)
GFR, Estimated: >60 mL/min (ref >60)
Glucose, Bld: 98 mg/dL (ref 70–99)
Potassium: 4.1 mmol/L (ref 3.5–5.1)
Sodium: 136 mmol/L (ref 135–145)
Total Bilirubin: 0.3 mg/dL (ref 0.3–1.2)
Total Protein: 6.1 g/dL — ABNORMAL LOW (ref 6.5–8.1)

## 2019-09-19 LAB — CBC WITH DIFFERENTIAL (CANCER CENTER ONLY)
Abs Immature Granulocytes: 0.21 10*3/uL — ABNORMAL HIGH (ref 0.00–0.07)
Basophils Absolute: 0 10*3/uL (ref 0.0–0.1)
Basophils Relative: 0 %
Eosinophils Absolute: 0 10*3/uL (ref 0.0–0.5)
Eosinophils Relative: 0 %
HCT: 39.2 % (ref 36.0–46.0)
Hemoglobin: 13.4 g/dL (ref 12.0–15.0)
Immature Granulocytes: 2 %
Lymphocytes Relative: 6 %
Lymphs Abs: 0.6 10*3/uL — ABNORMAL LOW (ref 0.7–4.0)
MCH: 32.1 pg (ref 26.0–34.0)
MCHC: 34.2 g/dL (ref 30.0–36.0)
MCV: 93.8 fL (ref 80.0–100.0)
Monocytes Absolute: 0.6 10*3/uL (ref 0.1–1.0)
Monocytes Relative: 5 %
Neutro Abs: 9.5 10*3/uL — ABNORMAL HIGH (ref 1.7–7.7)
Neutrophils Relative %: 87 %
Platelet Count: 219 10*3/uL (ref 150–400)
RBC: 4.18 MIL/uL (ref 3.87–5.11)
RDW: 13.8 % (ref 11.5–15.5)
WBC Count: 10.9 10*3/uL — ABNORMAL HIGH (ref 4.0–10.5)
nRBC: 0 % (ref 0.0–0.2)

## 2019-09-19 MED ORDER — HEPARIN SOD (PORK) LOCK FLUSH 100 UNIT/ML IV SOLN
500.0000 [IU] | Freq: Once | INTRAVENOUS | Status: AC | PRN
  Administered 2019-09-19: 500 [IU]
  Filled 2019-09-19: qty 5

## 2019-09-19 MED ORDER — SODIUM CHLORIDE 0.9 % IV SOLN
Freq: Once | INTRAVENOUS | Status: AC
  Filled 2019-09-19: qty 250

## 2019-09-19 MED ORDER — FENTANYL 75 MCG/HR TD PT72: 2.0000 | MEDICATED_PATCH | TRANSDERMAL | 0 refills | Status: DC

## 2019-09-19 MED ORDER — SODIUM CHLORIDE 0.9% FLUSH
10.0000 mL | Freq: Once | INTRAVENOUS | Status: AC
  Administered 2019-09-19: 10 mL via INTRAVENOUS
  Filled 2019-09-19: qty 10

## 2019-09-19 MED ORDER — SODIUM CHLORIDE 0.9 % IV SOLN
200.0000 mg | Freq: Once | INTRAVENOUS | Status: AC
  Administered 2019-09-19: 200 mg via INTRAVENOUS
  Filled 2019-09-19: qty 8

## 2019-09-19 MED ORDER — SODIUM CHLORIDE 0.9% FLUSH
10.0000 mL | INTRAVENOUS | Status: DC | PRN
  Administered 2019-09-19: 10 mL
  Filled 2019-09-19: qty 10

## 2019-09-19 MED ORDER — SODIUM CHLORIDE 0.9 % IV SOLN
Freq: Once | INTRAVENOUS | Status: DC
  Filled 2019-09-19: qty 250

## 2019-09-19 NOTE — Progress Notes (Signed)
Hematology and Oncology Follow Up Visit  [name] [id number] [birth year] 64 y.o. 09/19/2019   Principle Diagnosis:  Metastatic adenocarcinoma of the lung - brain and bone metastasis -- NO actionable mutations DVT of the right lower extremity  Current Therapy:        Carboplatinum/Alimta/pembrolizumab- started on 1/14/202, s/p cycle #5 -- Carbo/Alimta on hold Xgeva 120 mg IM q 3 month -- next dose 10/2019 Pradaxa 150 mg PO BID   Interim History:  [name] is here today for follow-up and treatment.  She actually is doing pretty well.  Her weight is up 3 pounds.  She seems to be eating a little bit better.  Her husband, who was comes in with her, says she is eating better.  She actually is using marijuana.  She gets the marijuana from an "associated."  This is helping her.  She does smoke marijuana every couple days.  She is still smoking cigarettes on occasion.  She did have a radiation therapy to the brain.  She had stereotactic radiosurgery to the brain mets.  It seems like the strength on the right side is improving.  Unfortunate, her father is not doing well.  He had a stroke a few years ago.  He seems to be declining quickly.  She has had no cough.  Pain control seems to be doing much better right now..  She is on Pradaxa for the thromboembolic disease.  This seems to be working for her.  Overall, I would say her performance status is ECOG 1-2.    Medications:  Allergies as of 09/19/2019   No Known Allergies     Medication List       Accurate as of Sep 19, 2019  9:36 AM. If you have any questions, ask your nurse or doctor.        STOP taking these medications   enoxaparin 100 MG/ML injection Commonly known as: LOVENOX Stopped by: Judy Napoleon, MD   granisetron 3.1 MG/24HR Commonly known as: SANCUSO Stopped by: Judy Napoleon, MD     TAKE these medications   acetaminophen 325 MG tablet Commonly known as: TYLENOL Take 2 tablets (650 mg total) by  mouth every 6 (six) hours.   dabigatran 150 MG Caps capsule Commonly known as: Pradaxa Take 1 capsule (150 mg total) by mouth 2 (two) times daily.   dexamethasone 4 MG tablet Commonly known as: DECADRON Take 1 tablet (4 mg total) by mouth daily. Follow taper instructions given.   dronabinol 5 MG capsule Commonly known as: MARINOL Take 1 capsule (5 mg total) by mouth 2 (two) times daily before lunch and supper.   feeding supplement (ENSURE ENLIVE) Liqd Take 237 mLs by mouth 2 (two) times daily between meals.   fentaNYL 75 MCG/HR Commonly known as: DURAGESIC Place 2 patches onto the skin every 3 (three) days.   folic acid 1 MG tablet Commonly known as: FOLVITE Take 1 tablet (1 mg total) by mouth daily. Start 5-7 days before Alimta chemotherapy. Continue until 21 days after Alimta completed.   gabapentin 300 MG capsule Commonly known as: NEURONTIN Take 1 capsule (300 mg total) by mouth 3 (three) times daily. What changed:   when to take this  reasons to take this   hydrALAZINE 25 MG tablet Commonly known as: APRESOLINE Take 25 mg by mouth 2 (two) times daily.   ketorolac 10 MG tablet Commonly known as: TORADOL Take 1 tablet (10 mg total) by mouth every 6 (six) hours as needed.  lactulose 10 GM/15ML solution Commonly known as: CHRONULAC Take 30 mLs (20 g total) by mouth 2 (two) times daily.   lidocaine-prilocaine cream Commonly known as: EMLA Apply to affected area once   lip balm ointment Apply topically as needed for lip care.   loratadine 10 MG tablet Commonly known as: CLARITIN Take 1 tablet (10 mg total) by mouth daily.   LORazepam 1 MG tablet Commonly known as: ATIVAN Take 1 tablet (1 mg total) by mouth every 6 (six) hours as needed for anxiety.   methocarbamol 500 MG tablet Commonly known as: ROBAXIN Take 1 tablet (500 mg total) by mouth every 6 (six) hours as needed for muscle spasms.   morphine 15 MG tablet Commonly known as: MSIR Take 1 tablet  (15 mg total) by mouth every 4 (four) hours as needed for severe pain.   multivitamin with minerals Tabs tablet Take 1 tablet by mouth daily.   Narcan 4 MG/0.1ML Liqd nasal spray kit Generic drug: naloxone Place 1 spray into the nose once as needed (overdose).   OLANZapine 10 MG tablet Commonly known as: ZYPREXA Take 1 tablet (10 mg total) by mouth at bedtime.   ondansetron 8 MG disintegrating tablet Commonly known as: Zofran ODT Take 1 tablet (8 mg total) by mouth every 8 (eight) hours as needed for nausea or vomiting.   ondansetron 8 MG tablet Commonly known as: Zofran Take 1 tablet (8 mg total) by mouth 2 (two) times daily as needed (Nausea or vomiting). Start if needed on the third day after chemotherapy.   oxymetazoline 0.05 % nasal spray Commonly known as: AFRIN Place 1 spray into both nostrils 2 (two) times daily as needed for congestion.   pantoprazole 40 MG tablet Commonly known as: PROTONIX TAKE 1 TABLET (40 MG TOTAL) BY MOUTH DAILY WITH SUPPER.   polyvinyl alcohol 1.4 % ophthalmic solution Commonly known as: LIQUIFILM TEARS Place 1 drop into both eyes as needed for dry eyes.   promethazine 12.5 MG tablet Commonly known as: PHENERGAN TAKE 1-2 TABLETS BY MOUTH EVERY 4 (FOUR) HOURS AS NEEDED FOR REFRACTORY NAUSEA / VOMITING.   senna-docusate 8.6-50 MG tablet Commonly known as: Senokot-S Take 1 tablet by mouth 2 (two) times daily.       Allergies: No Known Allergies  Past Medical History, Surgical history, Social history, and Family History were reviewed and updated.  Review of Systems: Review of Systems  Constitutional: Negative.   HENT: Negative.   Eyes: Negative.   Respiratory: Negative.   Cardiovascular: Negative.   Gastrointestinal: Negative.   Genitourinary: Negative.   Musculoskeletal: Positive for back pain and joint pain.  Skin: Negative.   Neurological: Positive for dizziness and focal weakness.  Endo/Heme/Allergies: Negative.     Psychiatric/Behavioral: Negative.      Physical Exam:  weight is 105 lb 0.6 oz (47.6 kg). Her temporal temperature is 97.7 F (36.5 C). Her blood pressure is 160/68 (abnormal) and her pulse is 77. Her respiration is 18 and oxygen saturation is 99%.   Wt Readings from Last 3 Encounters:  09/19/19 105 lb 0.6 oz (47.6 kg)  09/02/19 110 lb (49.9 kg)  08/28/19 104 lb 8 oz (47.4 kg)    Physical Exam Vitals reviewed.  HENT:     Head: Normocephalic and atraumatic.  Eyes:     Pupils: Pupils are equal, round, and reactive to light.  Cardiovascular:     Rate and Rhythm: Normal rate and regular rhythm.     Heart sounds: Normal heart sounds.  Pulmonary:     Effort: Pulmonary effort is normal.     Breath sounds: Normal breath sounds.  Abdominal:     General: Bowel sounds are normal.     Palpations: Abdomen is soft.  Musculoskeletal:        General: No tenderness or deformity. Normal range of motion.     Cervical back: Normal range of motion.  Lymphadenopathy:     Cervical: No cervical adenopathy.  Skin:    General: Skin is warm and dry.     Findings: No erythema or rash.  Neurological:     Mental Status: She is alert and oriented to person, place, and time.     Comments: Neurological exam does show the weakness on the right side.  This does seem to be getting a little bit better.  Psychiatric:        Behavior: Behavior normal.        Thought Content: Thought content normal.        Judgment: Judgment normal.      Lab Results  Component Value Date   WBC 10.9 (H) 09/19/2019   HGB 13.4 09/19/2019   HCT 39.2 09/19/2019   MCV 93.8 09/19/2019   PLT 219 09/19/2019   Lab Results  Component Value Date   FERRITIN 1,089 (H) 05/23/2019   IRON 31 (L) 05/23/2019   TIBC 155 (L) 05/23/2019   UIBC 124 05/23/2019   IRONPCTSAT 20 (L) 05/23/2019   Lab Results  Component Value Date   RBC 4.18 09/19/2019   No results found for: KPAFRELGTCHN, LAMBDASER, KAPLAMBRATIO No results found  for: IGGSERUM, IGA, IGMSERUM No results found for: Odetta Pink, SPEI   Chemistry      Component Value Date/Time   NA 136 09/19/2019 0850   K 4.1 09/19/2019 0850   CL 104 09/19/2019 0850   CO2 26 09/19/2019 0850   BUN 15 09/19/2019 0850   CREATININE 0.48 09/19/2019 0850      Component Value Date/Time   CALCIUM 8.9 09/19/2019 0850   ALKPHOS 85 09/19/2019 0850   AST 13 (L) 09/19/2019 0850   ALT 17 09/19/2019 0850   BILITOT 0.3 09/19/2019 0850       Impression and Plan: Ms. Reinhold is a very pleasant 64 yo female with metastatic adenocarcinoma of the right lung.   Looks like the nausea vomiting are better.  Maybe, the marijuana that she is using is helping this.  I am glad that her neurological status is improving.  I think that the stereotactic radiosurgery to the brain probably is helping.  She is on a steroid taper right now.  She is on 4 mg p.o. twice daily.  She will go down to 4 mg daily next week.  I still think that we can probably treat her today.  We probably do not need any kind of scans I would think until July.    Judy Napoleon, MD 5/27/20219:36 AM

## 2019-09-19 NOTE — Progress Notes (Signed)
Radiation Oncology         [phone] ________________________________  Name: [name] MRN: [id number]  Date: 09/19/2019  DOB: Mar 24, 1956  Post Treatment Note  CC: Judy Nigh, MD  Judy Nigh, MD  Diagnosis:   64 y.o. 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  Interval Since Last Radiation:  7 weeks   06/28/19:   L3 was treated to 8 Gy in a single fraction   06/11/19 - 06/24/19:  The chest/mediastinum were treated to a dose of 30 Gy in 10 fractions of 3 Gy each.  05/14/19 and 05/16/19:  1.  The Left Scapular lesion and T5 transverse process were treated to 8 Gy in one fraction  2.  The left anterior 6th rib metastasis was treated to 8 Gy in one fraction   05/02/19 1. The left scapula was treated to 3 Gy in a single fraction of a planned 10 fractions 2. The left ribs were treated to 3 Gy in a single fraction of a planned 10 fractions  Narrative:  I spoke with the patient to conduct her scheduled follow up visit to review her recent brain MRI scan via telephone to spare the patient unnecessary potential exposure in the healthcare setting during the current COVID-19 pandemic.  The patient was notified in advance and gave permission to proceed with this visit format. She tolerated her most recent radiotherapy to a painful site of metastasis at L3 very well and did not experience any negative effects.        In summary, she initially presented to the ED on 04/01/19 with progressive right sided weakness in the upper and lower extremities and was found to have a posterior frontal brain metastasis. Her systemic work up revealed a 3.6 cm spiculated mass in the medial right upper lobe lung, compatible with primary bronchogenic neoplasm.  Additionally, there were multiple bilateral pulmonary nodules, associated thoracic nodal metastases and multiple bone lesions. She underwent craniotomy and resection of the posterior frontal brain tumor for both  therapeutic and diagnostic purposes with Dr. Annette Stable on 04/05/2019, and pathology confirmed adenocarcinoma consistent with metastatic lung adenocarcinoma. No residual enhancement was seen on postop brain MRI. Her pain delayed her planned postop radiotherapy to the brain so ultimately, she elected not to proceed with the postop adjuvant brain radiation. She proceeded with chemotherapy though this resulted in pancytopenia which led to a hospitalization. While rehabilitating, she continued to have significant bone pain in the left scapula and left anterior 6th rib. She was going to initially receive 10 fractions for her bone pain, but ultimately her treatment was converted to a hypofractionated course due to inability to tolerate daily treatments due to poorly controlled pain. Once her pain was better controlled, she did return for additional palliative radiotherapy to the sites of bony pain which did provide excellent pain relief. PET imaging on 05/06/19 revealed multiple sites of disease in the bony skeleton including the right pelvis, lumbar and thoracic spine, as well as nodes in the chest and supraclavicular region. She also had faint hypermetabolism along the right glenoid region and multiple suspicious lesions in the ribs. She proceeded with a 2 week course of palliative radiotherapy to the adenopathy in the chest and neck/supraclavicular region, completed on 06/24/2019 and tolerated well.  She was able to resume systemic therapy with pembrolizumab only, under the care and direction of Dr. Marin Olp shortly after completion of radiation but developed severe low back pain which was poorly controlled  with oral pain medications so she elected to proceed with a single fraction of palliative radiotherapy to a known site of metastatic disease at L3 which correlated with her pain/symptoms.   This was completed on 06/28/19 and she tolerated this well.  She has decided not to resume chemotherapy and instead has continued with the  Pembrolizumab immunotherapy only. A repeat PET scan was performed on 07/30/19 and shows a mixed response to therapy with decreased size/hypermetabolism of pulmonary nodules and nodes, particularly in the thoracic and less so lower cervical/abdominal. There was a mixed response response of osseous metastasis with some lesions appearing to have undergone interval healing and decreased hypermetabolism, while other lesions are new/increasingly hypermetabolic in the lumbar spine at L1 and L5.     She had a follow-up brain MRI on 08/14/19 and this showed a total of 11 new metastases identified scattered throughout the cerebellum and both cerebral hemispheres with 4 sub centimeter lesions in the right cerebellar hemisphere, 2 metastases in the right cerebral hemisphere with the largest measuring 13 mm in the right frontal lobe and a 5 mm lesion in the right parietal vertex adjacent to the falx and 5 new metastases in the left cerebral hemisphere with a 3 mm lesion in the left temporal lobe, a 3 mm lesion in the left temporoparietal junction, an 11 mm lesion in the left frontal lobe, a 5 mm lesion in the left posterior frontal lobe and a 3 mm lesion in the left parietal lobe. There was considerable vasogenic edema, particularly affecting the cerebellum and both frontal regions. The previous site of metastasis at the left parietal vertex had a good appearance without evidence of residual or recurrent disease in that location.    We met with her on 08/15/19 to discuss treatment recommendations for the brain metastases and she elected to proceed with a single fraction of SRS to the 11 new mets and the resection cavity. Her treatment was completed on 08/23/19 and tolerated very well.  INTERVAL HISTORY:   Her husband, [name], reports that she is doing well in general and gradually regaining strength. She has continued on single agent Keytruda due to intolerance of chemotherapy, even at the reduced dose.         On review of  systems, the patient states that she is doing fairly well in general.  Her husband, [name], reports that she is making good progress and her energy is gradually returning.  She denies bowel incontinence or back pain.  She denies headaches, changes in auditory or visual acuity, dizziness, difficulty with speech or seizure activity. Her pain is much better controlled though she still relies on pain medications around the clock.  She denies any focal site of pain in the spine and denies any associated paraesthesias or focal weakness in the lower extremities. She reports that her pain is tolerable with her current pain regimen. She has regained some function in the right upper extremity but not as much as she would like and/or hoped.  The right lower extremity weakness has almost resolved completely. She has had some swelling in her ankles and in the right upper extremity and had a doppler U/S on 08/06/19 which showed a chronic nonocclusive DVT in the right femoral vein as well as an occlusive DVT in the right peroneal vein despite being on anticoagulation with Lovenox. Dr. Marin Olp recently switched her to Pradaxa oral anticoagulation.  ALLERGIES:  has No Known Allergies.  Meds: Current Outpatient Medications  Medication Sig Dispense Refill  .  acetaminophen (TYLENOL) 325 MG tablet Take 2 tablets (650 mg total) by mouth every 6 (six) hours.    . dabigatran (PRADAXA) 150 MG CAPS capsule Take 1 capsule (150 mg total) by mouth 2 (two) times daily. 60 capsule 6  . dexamethasone (DECADRON) 4 MG tablet Take 1 tablet (4 mg total) by mouth daily. Follow taper instructions given. 30 tablet 1  . dronabinol (MARINOL) 5 MG capsule Take 1 capsule (5 mg total) by mouth 2 (two) times daily before lunch and supper. 60 capsule 0  . feeding supplement, ENSURE ENLIVE, (ENSURE ENLIVE) LIQD Take 237 mLs by mouth 2 (two) times daily between meals. 119 mL 12  . folic acid (FOLVITE) 1 MG tablet Take 1 tablet (1 mg total) by mouth  daily. Start 5-7 days before Alimta chemotherapy. Continue until 21 days after Alimta completed. 100 tablet 3  . gabapentin (NEURONTIN) 300 MG capsule Take 1 capsule (300 mg total) by mouth 3 (three) times daily. (Patient taking differently: Take 300 mg by mouth 3 (three) times daily as needed. ) 90 capsule 4  . hydrALAZINE (APRESOLINE) 25 MG tablet Take 25 mg by mouth 2 (two) times daily.    Marland Kitchen ketorolac (TORADOL) 10 MG tablet Take 1 tablet (10 mg total) by mouth every 6 (six) hours as needed. (Patient not taking: Reported on 09/19/2019) 30 tablet 1  . lactulose (CHRONULAC) 10 GM/15ML solution Take 30 mLs (20 g total) by mouth 2 (two) times daily. 1000 mL 4  . lidocaine-prilocaine (EMLA) cream Apply to affected area once 30 g 3  . lip balm (CARMEX) ointment Apply topically as needed for lip care. 7 g 0  . loratadine (CLARITIN) 10 MG tablet Take 1 tablet (10 mg total) by mouth daily. 30 tablet 0  . LORazepam (ATIVAN) 1 MG tablet Take 1 tablet (1 mg total) by mouth every 6 (six) hours as needed for anxiety. 30 tablet 0  . methocarbamol (ROBAXIN) 500 MG tablet Take 1 tablet (500 mg total) by mouth every 6 (six) hours as needed for muscle spasms. 60 tablet 3  . morphine (MSIR) 15 MG tablet Take 1 tablet (15 mg total) by mouth every 4 (four) hours as needed for severe pain. 90 tablet 0  . Multiple Vitamin (MULTIVITAMIN WITH MINERALS) TABS tablet Take 1 tablet by mouth daily. 30 tablet 0  . NARCAN 4 MG/0.1ML LIQD nasal spray kit Place 1 spray into the nose once as needed (overdose).     Marland Kitchen OLANZapine (ZYPREXA) 10 MG tablet Take 1 tablet (10 mg total) by mouth at bedtime. 30 tablet 3  . ondansetron (ZOFRAN ODT) 8 MG disintegrating tablet Take 1 tablet (8 mg total) by mouth every 8 (eight) hours as needed for nausea or vomiting. 20 tablet 2  . ondansetron (ZOFRAN) 8 MG tablet Take 1 tablet (8 mg total) by mouth 2 (two) times daily as needed (Nausea or vomiting). Start if needed on the third day after  chemotherapy. 30 tablet 1  . oxymetazoline (AFRIN) 0.05 % nasal spray Place 1 spray into both nostrils 2 (two) times daily as needed for congestion.    . pantoprazole (PROTONIX) 40 MG tablet TAKE 1 TABLET (40 MG TOTAL) BY MOUTH DAILY WITH SUPPER. 90 tablet 3  . polyvinyl alcohol (LIQUIFILM TEARS) 1.4 % ophthalmic solution Place 1 drop into both eyes as needed for dry eyes.    . promethazine (PHENERGAN) 12.5 MG tablet TAKE 1-2 TABLETS BY MOUTH EVERY 4 (FOUR) HOURS AS NEEDED FOR REFRACTORY NAUSEA /  VOMITING. 30 tablet 3  . senna-docusate (SENOKOT-S) 8.6-50 MG tablet Take 1 tablet by mouth 2 (two) times daily. 100 tablet 2  . fentaNYL (DURAGESIC) 75 MCG/HR Place 2 patches onto the skin every 3 (three) days. 15 patch 0   No current facility-administered medications for this encounter.    Physical Findings:  vitals were not taken for this visit.   /Unable to assess due to telephone follow up visit format.  Lab Findings: Lab Results  Component Value Date   WBC 10.9 (H) 09/19/2019   HGB 13.4 09/19/2019   HCT 39.2 09/19/2019   MCV 93.8 09/19/2019   PLT 219 09/19/2019     Radiographic Findings: No results found.  Impression/Plan: 54. 64 y.o. 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 She appears to have recovered well from the effects of her recent [facility] treatment.  She is currently without complaint aside from residual fatigue which is gradually improving.  We discussed the plan for repeat brain MRI in approximately 2 months to assess treatment response and pending the scan is stable, we will move forward with serial brain MRI scans every 3 months to monitor for any new disease.  She will have a telephone follow-up visit after each scan to review results and recommendations from multidisciplinary brain conference.  She will also continue in routine follow-up under the care and direction of Dr. Marin Olp for continued management of her systemic  disease.    Nicholos Johns, PA-C

## 2019-09-19 NOTE — Patient Instructions (Signed)
Pembrolizumab injection What is this medicine? PEMBROLIZUMAB (pem broe liz ue mab) is a monoclonal antibody. It is used to treat certain types of cancer. This medicine may be used for other purposes; ask your health care provider or pharmacist if you have questions. COMMON BRAND NAME(S): Keytruda What should I tell my health care provider before I take this medicine? They need to know if you have any of these conditions:  diabetes  immune system problems  inflammatory bowel disease  liver disease  lung or breathing disease  lupus  received or scheduled to receive an organ transplant or a stem-cell transplant that uses donor stem cells  an unusual or allergic reaction to pembrolizumab, other medicines, foods, dyes, or preservatives  pregnant or trying to get pregnant  breast-feeding How should I use this medicine? This medicine is for infusion into a vein. It is given by a health care professional in a hospital or clinic setting. A special MedGuide will be given to you before each treatment. Be sure to read this information carefully each time. Talk to your pediatrician regarding the use of this medicine in children. While this drug may be prescribed for children as young as 6 months for selected conditions, precautions do apply. Overdosage: If you think you have taken too much of this medicine contact a poison control center or emergency room at once. NOTE: This medicine is only for you. Do not share this medicine with others. What if I miss a dose? It is important not to miss your dose. Call your doctor or health care professional if you are unable to keep an appointment. What may interact with this medicine? Interactions have not been studied. Give your health care provider a list of all the medicines, herbs, non-prescription drugs, or dietary supplements you use. Also tell them if you smoke, drink alcohol, or use illegal drugs. Some items may interact with your medicine. This  list may not describe all possible interactions. Give your health care provider a list of all the medicines, herbs, non-prescription drugs, or dietary supplements you use. Also tell them if you smoke, drink alcohol, or use illegal drugs. Some items may interact with your medicine. What should I watch for while using this medicine? Your condition will be monitored carefully while you are receiving this medicine. You may need blood work done while you are taking this medicine. Do not become pregnant while taking this medicine or for 4 months after stopping it. Women should inform their doctor if they wish to become pregnant or think they might be pregnant. There is a potential for serious side effects to an unborn child. Talk to your health care professional or pharmacist for more information. Do not breast-feed an infant while taking this medicine or for 4 months after the last dose. What side effects may I notice from receiving this medicine? Side effects that you should report to your doctor or health care professional as soon as possible:  allergic reactions like skin rash, itching or hives, swelling of the face, lips, or tongue  bloody or black, tarry  breathing problems  changes in vision  chest pain  chills  confusion  constipation  cough  diarrhea  dizziness or feeling faint or lightheaded  fast or irregular heartbeat  fever  flushing  joint pain  low blood counts - this medicine may decrease the number of white blood cells, red blood cells and platelets. You may be at increased risk for infections and bleeding.  muscle pain  muscle   weakness  pain, tingling, numbness in the hands or feet  persistent headache  redness, blistering, peeling or loosening of the skin, including inside the mouth  signs and symptoms of high blood sugar such as dizziness; dry mouth; dry skin; fruity breath; nausea; stomach pain; increased hunger or thirst; increased urination  signs  and symptoms of kidney injury like trouble passing urine or change in the amount of urine  signs and symptoms of liver injury like dark urine, light-colored stools, loss of appetite, nausea, right upper belly pain, yellowing of the eyes or skin  sweating  swollen lymph nodes  weight loss Side effects that usually do not require medical attention (report to your doctor or health care professional if they continue or are bothersome):  decreased appetite  hair loss  muscle pain  tiredness This list may not describe all possible side effects. Call your doctor for medical advice about side effects. You may report side effects to FDA at 1-800-FDA-1088. Where should I keep my medicine? This drug is given in a hospital or clinic and will not be stored at home. NOTE: This sheet is a summary. It may not cover all possible information. If you have questions about this medicine, talk to your doctor, pharmacist, or health care provider.  2020 Elsevier/Gold Standard (2019-02-15 18:07:58)  

## 2019-09-19 NOTE — Telephone Encounter (Signed)
Appointments scheduled calendar printed & mailed per 5/27 los

## 2019-09-20 ENCOUNTER — Other Ambulatory Visit: Payer: Self-pay | Admitting: *Deleted

## 2019-09-20 DIAGNOSIS — C3490 Malignant neoplasm of unspecified part of unspecified bronchus or lung: Secondary | ICD-10-CM

## 2019-09-20 DIAGNOSIS — R112 Nausea with vomiting, unspecified: Secondary | ICD-10-CM

## 2019-09-20 DIAGNOSIS — C7931 Secondary malignant neoplasm of brain: Secondary | ICD-10-CM

## 2019-09-20 DIAGNOSIS — T451X5A Adverse effect of antineoplastic and immunosuppressive drugs, initial encounter: Secondary | ICD-10-CM

## 2019-09-20 LAB — TSH: TSH: 1.34 u[IU]/mL (ref 0.308–3.960)

## 2019-09-20 LAB — LACTATE DEHYDROGENASE: LDH: 276 U/L — ABNORMAL HIGH (ref 98–192)

## 2019-09-20 LAB — T4: T4, Total: 7.1 ug/dL (ref 4.5–12.0)

## 2019-09-20 MED ORDER — OLANZAPINE 10 MG PO TABS: 10.0000 mg | ORAL_TABLET | Freq: Every day | ORAL | 1 refills | Status: AC

## 2019-09-25 ENCOUNTER — Telehealth: Payer: Self-pay | Admitting: Radiation Oncology

## 2019-09-25 ENCOUNTER — Other Ambulatory Visit: Payer: Self-pay | Admitting: Urology

## 2019-09-25 ENCOUNTER — Other Ambulatory Visit: Payer: Self-pay | Admitting: Hematology & Oncology

## 2019-09-25 DIAGNOSIS — C3491 Malignant neoplasm of unspecified part of right bronchus or lung: Secondary | ICD-10-CM

## 2019-09-25 DIAGNOSIS — M25551 Pain in right hip: Secondary | ICD-10-CM

## 2019-09-25 DIAGNOSIS — C7951 Secondary malignant neoplasm of bone: Secondary | ICD-10-CM

## 2019-09-25 DIAGNOSIS — R112 Nausea with vomiting, unspecified: Secondary | ICD-10-CM

## 2019-09-25 DIAGNOSIS — F419 Anxiety disorder, unspecified: Secondary | ICD-10-CM

## 2019-09-25 DIAGNOSIS — T451X5A Adverse effect of antineoplastic and immunosuppressive drugs, initial encounter: Secondary | ICD-10-CM

## 2019-09-25 DIAGNOSIS — C7931 Secondary malignant neoplasm of brain: Secondary | ICD-10-CM

## 2019-09-25 DIAGNOSIS — C3490 Malignant neoplasm of unspecified part of unspecified bronchus or lung: Secondary | ICD-10-CM

## 2019-09-25 MED ORDER — LORAZEPAM 1 MG PO TABS: 1.0000 mg | ORAL_TABLET | Freq: Four times a day (QID) | ORAL | 0 refills | Status: AC | PRN

## 2019-09-25 MED ORDER — DEXAMETHASONE 4 MG PO TABS: 4.0000 mg | ORAL_TABLET | Freq: Every day | ORAL | 1 refills | Status: DC

## 2019-09-25 NOTE — Telephone Encounter (Signed)
I agree that we would ideally like to get her off the steroids so that her immunotherapy will be most effective. I am happy to send some additional 4mg  tablets in to complete her current taper as planned but if they remain concerned with her sluggishness as we near completion of the taper, I would encourage them to discuss this further with her medical oncologist, Dr. Marin Olp to get his opinion on continuing the Decadron or if there is something else he could recommend instead. I agree that the sluggishness is most likely secondary to recent brain SRS and will improve on its own with some additional time. -Mairen Wallenstein

## 2019-09-25 NOTE — Telephone Encounter (Signed)
Phoned patient and verbalized the following as directed by Freeman Caldron, PA-C: Ideally we would like to get her off the steroids so that her immunotherapy will be most effective. [name] is happy to send some additional 4mg  tablets in to complete her current taper as planned but if they remain concerned with her sluggishness as we near completion of the taper, [name] encourages them to discuss this further with her medical oncologist, Dr. Marin Olp to get his opinion on continuing the Decadron or if there is something else he could recommend instead. [name] continues to believe that the sluggishness is most likely secondary to recent brain SRS and will improve on its own with some additional time.  [name], patient's husband, verbalized understanding and expressed appreciation for return call.

## 2019-09-25 NOTE — Telephone Encounter (Signed)
Received voicemail message from patient's husband, [name], requesting help with decadron refill. Phoned Eric back. He explains the pharmacy will not refill the decadron script because its too early based upon the initial taper. Taper instructions changed halfway through due to patient sluggishness. [name] explains his wife should start today taking decadron 4 mg once per day but is completely out of medication. Explained this RN would request [name], PA-C send in new script for decadron.   Also, [name] reports his wife becomes more sluggish the more she tapers off the decadron. Again, he questions is this normal. Reinforced this sluggishness could linger for 2-3 months s/p radiation and tapering off decadron was complicating it more. However, explained it is necessary to taper off decadron for his wife's overall well being. [name] verbalized understanding but requested this RN be sure to inform the providers.

## 2019-09-26 ENCOUNTER — Encounter: Payer: Self-pay | Admitting: Hematology & Oncology

## 2019-10-02 ENCOUNTER — Other Ambulatory Visit: Payer: Self-pay | Admitting: Hematology & Oncology

## 2019-10-02 DIAGNOSIS — F419 Anxiety disorder, unspecified: Secondary | ICD-10-CM

## 2019-10-02 DIAGNOSIS — C7951 Secondary malignant neoplasm of bone: Secondary | ICD-10-CM

## 2019-10-02 DIAGNOSIS — T451X5A Adverse effect of antineoplastic and immunosuppressive drugs, initial encounter: Secondary | ICD-10-CM

## 2019-10-02 DIAGNOSIS — C3490 Malignant neoplasm of unspecified part of unspecified bronchus or lung: Secondary | ICD-10-CM

## 2019-10-02 DIAGNOSIS — C3491 Malignant neoplasm of unspecified part of right bronchus or lung: Secondary | ICD-10-CM

## 2019-10-02 DIAGNOSIS — C7931 Secondary malignant neoplasm of brain: Secondary | ICD-10-CM

## 2019-10-02 DIAGNOSIS — M25551 Pain in right hip: Secondary | ICD-10-CM

## 2019-10-02 DIAGNOSIS — R112 Nausea with vomiting, unspecified: Secondary | ICD-10-CM

## 2019-10-03 ENCOUNTER — Other Ambulatory Visit: Payer: Self-pay | Admitting: *Deleted

## 2019-10-03 DIAGNOSIS — F419 Anxiety disorder, unspecified: Secondary | ICD-10-CM

## 2019-10-03 DIAGNOSIS — R112 Nausea with vomiting, unspecified: Secondary | ICD-10-CM

## 2019-10-03 DIAGNOSIS — C3490 Malignant neoplasm of unspecified part of unspecified bronchus or lung: Secondary | ICD-10-CM

## 2019-10-03 DIAGNOSIS — C3491 Malignant neoplasm of unspecified part of right bronchus or lung: Secondary | ICD-10-CM

## 2019-10-03 DIAGNOSIS — M25551 Pain in right hip: Secondary | ICD-10-CM

## 2019-10-03 DIAGNOSIS — C7951 Secondary malignant neoplasm of bone: Secondary | ICD-10-CM

## 2019-10-03 DIAGNOSIS — T451X5A Adverse effect of antineoplastic and immunosuppressive drugs, initial encounter: Secondary | ICD-10-CM

## 2019-10-03 DIAGNOSIS — C7931 Secondary malignant neoplasm of brain: Secondary | ICD-10-CM

## 2019-10-03 MED ORDER — MORPHINE SULFATE 15 MG PO TABS: 15.0000 mg | ORAL_TABLET | ORAL | 0 refills | Status: DC | PRN

## 2019-10-03 NOTE — Progress Notes (Unsigned)
Pharmacist Chemotherapy Monitoring - Follow Up Assessment    I verify that I have reviewed each item in the below checklist:  . Regimen for the patient is scheduled for the appropriate day and plan matches scheduled date. Marland Kitchen Appropriate non-routine labs are ordered dependent on drug ordered. . If applicable, additional medications reviewed and ordered per protocol based on lifetime cumulative doses and/or treatment regimen.   Plan for follow-up and/or issues identified: No . I-vent associated with next due treatment: No . MD and/or nursing notified: No  Rahel Carlton, Jacqlyn Larsen 10/03/2019 2:58 PM

## 2019-10-04 ENCOUNTER — Other Ambulatory Visit: Payer: Self-pay | Admitting: Radiation Oncology

## 2019-10-04 MED ORDER — DEXAMETHASONE 2 MG PO TABS
ORAL_TABLET | ORAL | 0 refills | Status: DC
Start: 2019-10-04 — End: 2019-10-18

## 2019-10-04 NOTE — Progress Notes (Signed)
Received call from patient's husband, [name], that they are out of decadron. [name] explains that because of his wife's unsteady gait they had to go up on the medication for seven days causing them to run out. Also, [name] reports struggling to break tablets in half. [name] understands a script for a 2 mg tablet will be sent this time instead of a 4 mg allowing for easier breakage next week.

## 2019-10-06 ENCOUNTER — Encounter: Payer: Self-pay | Admitting: Hematology & Oncology

## 2019-10-07 ENCOUNTER — Telehealth: Payer: Self-pay | Admitting: Hematology & Oncology

## 2019-10-07 NOTE — Telephone Encounter (Signed)
Returned call to [employer].  He was requesting to have appointments moved from 6/17 to 6/16.  Appointments were able to be moved per 6/14 sch msg

## 2019-10-09 ENCOUNTER — Other Ambulatory Visit: Payer: Self-pay

## 2019-10-09 ENCOUNTER — Inpatient Hospital Stay: Payer: BC Managed Care – PPO | Attending: Hematology & Oncology

## 2019-10-09 ENCOUNTER — Encounter: Payer: Self-pay | Admitting: Hematology & Oncology

## 2019-10-09 ENCOUNTER — Inpatient Hospital Stay: Payer: BC Managed Care – PPO

## 2019-10-09 ENCOUNTER — Inpatient Hospital Stay (HOSPITAL_BASED_OUTPATIENT_CLINIC_OR_DEPARTMENT_OTHER): Payer: BC Managed Care – PPO | Admitting: Hematology & Oncology

## 2019-10-09 VITALS — BP 141/86 | HR 81 | Temp 97.8°F | Resp 18 | Wt 115.0 lb

## 2019-10-09 DIAGNOSIS — Z7901 Long term (current) use of anticoagulants: Secondary | ICD-10-CM | POA: Insufficient documentation

## 2019-10-09 DIAGNOSIS — Z923 Personal history of irradiation: Secondary | ICD-10-CM | POA: Diagnosis not present

## 2019-10-09 DIAGNOSIS — Z5112 Encounter for antineoplastic immunotherapy: Secondary | ICD-10-CM | POA: Diagnosis not present

## 2019-10-09 DIAGNOSIS — C7951 Secondary malignant neoplasm of bone: Secondary | ICD-10-CM | POA: Insufficient documentation

## 2019-10-09 DIAGNOSIS — Z79899 Other long term (current) drug therapy: Secondary | ICD-10-CM | POA: Diagnosis not present

## 2019-10-09 DIAGNOSIS — C7931 Secondary malignant neoplasm of brain: Secondary | ICD-10-CM | POA: Diagnosis not present

## 2019-10-09 DIAGNOSIS — R531 Weakness: Secondary | ICD-10-CM | POA: Diagnosis not present

## 2019-10-09 DIAGNOSIS — C3491 Malignant neoplasm of unspecified part of right bronchus or lung: Secondary | ICD-10-CM

## 2019-10-09 DIAGNOSIS — M549 Dorsalgia, unspecified: Secondary | ICD-10-CM | POA: Diagnosis not present

## 2019-10-09 DIAGNOSIS — R42 Dizziness and giddiness: Secondary | ICD-10-CM | POA: Diagnosis not present

## 2019-10-09 DIAGNOSIS — C3490 Malignant neoplasm of unspecified part of unspecified bronchus or lung: Secondary | ICD-10-CM

## 2019-10-09 LAB — CMP (CANCER CENTER ONLY)
ALT: 13 U/L (ref 0–44)
AST: 11 U/L — ABNORMAL LOW (ref 15–41)
Albumin: 3.7 g/dL (ref 3.5–5.0)
Alkaline Phosphatase: 105 U/L (ref 38–126)
Anion gap: 9 (ref 5–15)
BUN: 11 mg/dL (ref 8–23)
CO2: 27 mmol/L (ref 22–32)
Calcium: 9.2 mg/dL (ref 8.9–10.3)
Chloride: 102 mmol/L (ref 98–111)
Creatinine: 0.41 mg/dL — ABNORMAL LOW (ref 0.44–1.00)
GFR, Est AFR Am: >60 mL/min (ref >60)
GFR, Estimated: >60 mL/min (ref >60)
Glucose, Bld: 122 mg/dL — ABNORMAL HIGH (ref 70–99)
Potassium: 4 mmol/L (ref 3.5–5.1)
Sodium: 138 mmol/L (ref 135–145)
Total Bilirubin: 0.3 mg/dL (ref 0.3–1.2)
Total Protein: 6.1 g/dL — ABNORMAL LOW (ref 6.5–8.1)

## 2019-10-09 LAB — CBC WITH DIFFERENTIAL (CANCER CENTER ONLY)
Abs Immature Granulocytes: 0.39 10*3/uL — ABNORMAL HIGH (ref 0.00–0.07)
Basophils Absolute: 0.1 10*3/uL (ref 0.0–0.1)
Basophils Relative: 1 %
Eosinophils Absolute: 0 10*3/uL (ref 0.0–0.5)
Eosinophils Relative: 0 %
HCT: 35 % — ABNORMAL LOW (ref 36.0–46.0)
Hemoglobin: 11.5 g/dL — ABNORMAL LOW (ref 12.0–15.0)
Immature Granulocytes: 4 %
Lymphocytes Relative: 5 %
Lymphs Abs: 0.5 10*3/uL — ABNORMAL LOW (ref 0.7–4.0)
MCH: 31.1 pg (ref 26.0–34.0)
MCHC: 32.9 g/dL (ref 30.0–36.0)
MCV: 94.6 fL (ref 80.0–100.0)
Monocytes Absolute: 0.4 10*3/uL (ref 0.1–1.0)
Monocytes Relative: 4 %
Neutro Abs: 8.7 10*3/uL — ABNORMAL HIGH (ref 1.7–7.7)
Neutrophils Relative %: 86 %
Platelet Count: 217 10*3/uL (ref 150–400)
RBC: 3.7 MIL/uL — ABNORMAL LOW (ref 3.87–5.11)
RDW: 14.6 % (ref 11.5–15.5)
WBC Count: 10 10*3/uL (ref 4.0–10.5)
nRBC: 0 % (ref 0.0–0.2)

## 2019-10-09 LAB — LACTATE DEHYDROGENASE: LDH: 320 U/L — ABNORMAL HIGH (ref 98–192)

## 2019-10-09 MED ORDER — SODIUM CHLORIDE 0.9 % IV SOLN
Freq: Once | INTRAVENOUS | Status: AC
  Filled 2019-10-09: qty 250

## 2019-10-09 MED ORDER — SODIUM CHLORIDE 0.9 % IV SOLN
200.0000 mg | Freq: Once | INTRAVENOUS | Status: AC
  Administered 2019-10-09: 200 mg via INTRAVENOUS
  Filled 2019-10-09: qty 8

## 2019-10-09 MED ORDER — HEPARIN SOD (PORK) LOCK FLUSH 100 UNIT/ML IV SOLN
500.0000 [IU] | Freq: Once | INTRAVENOUS | Status: AC | PRN
  Administered 2019-10-09: 500 [IU]
  Filled 2019-10-09: qty 5

## 2019-10-09 MED ORDER — SODIUM CHLORIDE 0.9% FLUSH
10.0000 mL | INTRAVENOUS | Status: DC | PRN
  Administered 2019-10-09: 10 mL
  Filled 2019-10-09: qty 10

## 2019-10-09 NOTE — Progress Notes (Signed)
Hematology and Oncology Follow Up Visit  [name] [id number] 1956-02-29 64 y.o. 10/09/2019   Principle Diagnosis:  Metastatic adenocarcinoma of the lung - brain and bone metastasis -- NO actionable mutations DVT of the right lower extremity  Current Therapy:        Carboplatinum/Alimta/pembrolizumab- started on 1/14/202, s/p cycle #6 -- Carbo/Alimta on hold Xgeva 120 mg IM q 3 month -- next dose 10/2019 Pradaxa 150 mg PO BID   Interim History:  [name] is here today for follow-up and treatment.  Unfortunately, she began to have more problems with pain.  She was having a lot of back pain.  This is in the upper back.  This started 2 weeks ago.  She did not fall.  She had no trauma.  She completed radiation therapy to the brain I think back in May.  Her husband says that since she finished the radiation, she has had some more difficulty with walking.  She is taking Duragesic patch 150 mcg every 3 days.  I told her husband to increase this to every 2-day dosing.  She says that after the second day, the patch wears off.  She is on morphine basically taken 30 mg every 6 hours.  I will probably switch her to Dilaudid.  This is a Pharmacologist.  She might be developing some tolerance to the morphine.  Clearly, our goal is quality of life.  This is always been our goal.  I suspect were probably going to have to do a MRI of the brain.  We probably will also have to do another PET scan to see if she is progressing.  If she is progressing, then our option really is just chemotherapy if she would be willing to take this.  If not, then we would clearly have to get Hospice involved.  She has gained weight.  Her husband says she is eating better.  She is having no nausea or vomiting.  She is not having diarrhea.  She is not having any bleeding.  She is on Pradaxa and doing well with the Pradaxa.  Currently, I would say her performance status, at best is ECOG 2.       Medications:  Allergies as of 10/09/2019   No Known Allergies     Medication List       Accurate as of October 09, 2019 10:54 AM. If you have any questions, ask your nurse or doctor.        acetaminophen 325 MG tablet Commonly known as: TYLENOL Take 2 tablets (650 mg total) by mouth every 6 (six) hours.   dabigatran 150 MG Caps capsule Commonly known as: Pradaxa Take 1 capsule (150 mg total) by mouth 2 (two) times daily.   dexamethasone 2 MG tablet Commonly known as: DECADRON Take decadron 2 mg once daily until 10/10/2019 then half tablet (43m) daily for seven days and STOP.   dronabinol 5 MG capsule Commonly known as: MARINOL Take 1 capsule (5 mg total) by mouth 2 (two) times daily before lunch and supper.   feeding supplement (ENSURE ENLIVE) Liqd Take 237 mLs by mouth 2 (two) times daily between meals.   fentaNYL 75 MCG/HR Commonly known as: DURAGESIC Place 2 patches onto the skin every 3 (three) days.   folic acid 1 MG tablet Commonly known as: FOLVITE Take 1 tablet (1 mg total) by mouth daily. Start 5-7 days before Alimta chemotherapy. Continue until 21 days after Alimta completed.   gabapentin 300 MG capsule Commonly  known as: NEURONTIN Take 1 capsule (300 mg total) by mouth 3 (three) times daily. What changed:   when to take this  reasons to take this   hydrALAZINE 25 MG tablet Commonly known as: APRESOLINE Take 25 mg by mouth 2 (two) times daily.   ketorolac 10 MG tablet Commonly known as: TORADOL Take 1 tablet (10 mg total) by mouth every 6 (six) hours as needed.   lactulose 10 GM/15ML solution Commonly known as: CHRONULAC Take 30 mLs (20 g total) by mouth 2 (two) times daily.   lidocaine-prilocaine cream Commonly known as: EMLA Apply to affected area once   lip balm ointment Apply topically as needed for lip care.   loratadine 10 MG tablet Commonly known as: CLARITIN Take 1 tablet (10 mg total) by mouth daily.   LORazepam 1 MG  tablet Commonly known as: ATIVAN Take 1 tablet (1 mg total) by mouth every 6 (six) hours as needed for anxiety.   methocarbamol 500 MG tablet Commonly known as: ROBAXIN Take 1 tablet (500 mg total) by mouth every 6 (six) hours as needed for muscle spasms.   morphine 15 MG tablet Commonly known as: MSIR Take 1 tablet (15 mg total) by mouth every 4 (four) hours as needed for severe pain.   multivitamin with minerals Tabs tablet Take 1 tablet by mouth daily.   Narcan 4 MG/0.1ML Liqd nasal spray kit Generic drug: naloxone Place 1 spray into the nose once as needed (overdose).   OLANZapine 10 MG tablet Commonly known as: ZYPREXA Take 1 tablet (10 mg total) by mouth at bedtime.   ondansetron 8 MG disintegrating tablet Commonly known as: Zofran ODT Take 1 tablet (8 mg total) by mouth every 8 (eight) hours as needed for nausea or vomiting.   ondansetron 8 MG tablet Commonly known as: Zofran Take 1 tablet (8 mg total) by mouth 2 (two) times daily as needed (Nausea or vomiting). Start if needed on the third day after chemotherapy.   oxymetazoline 0.05 % nasal spray Commonly known as: AFRIN Place 1 spray into both nostrils 2 (two) times daily as needed for congestion.   pantoprazole 40 MG tablet Commonly known as: PROTONIX TAKE 1 TABLET (40 MG TOTAL) BY MOUTH DAILY WITH SUPPER.   polyvinyl alcohol 1.4 % ophthalmic solution Commonly known as: LIQUIFILM TEARS Place 1 drop into both eyes as needed for dry eyes.   promethazine 12.5 MG tablet Commonly known as: PHENERGAN TAKE 1-2 TABLETS BY MOUTH EVERY 4 (FOUR) HOURS AS NEEDED FOR REFRACTORY NAUSEA / VOMITING.   senna-docusate 8.6-50 MG tablet Commonly known as: Senokot-S Take 1 tablet by mouth 2 (two) times daily.       Allergies: No Known Allergies  Past Medical History, Surgical history, Social history, and Family History were reviewed and updated.  Review of Systems: Review of Systems  Constitutional: Negative.    HENT: Negative.   Eyes: Negative.   Respiratory: Negative.   Cardiovascular: Negative.   Gastrointestinal: Negative.   Genitourinary: Negative.   Musculoskeletal: Positive for back pain and joint pain.  Skin: Negative.   Neurological: Positive for dizziness and focal weakness.  Endo/Heme/Allergies: Negative.   Psychiatric/Behavioral: Negative.      Physical Exam:  weight is 115 lb (52.2 kg). Her temporal temperature is 97.8 F (36.6 C). Her blood pressure is 141/86 (abnormal) and her pulse is 81. Her respiration is 18 and oxygen saturation is 100%.   Wt Readings from Last 3 Encounters:  10/09/19 115 lb (52.2 kg)  09/19/19 105 lb 0.6 oz (47.6 kg)  09/02/19 110 lb (49.9 kg)    Physical Exam Vitals reviewed.  HENT:     Head: Normocephalic and atraumatic.  Eyes:     Pupils: Pupils are equal, round, and reactive to light.  Cardiovascular:     Rate and Rhythm: Normal rate and regular rhythm.     Heart sounds: Normal heart sounds.  Pulmonary:     Effort: Pulmonary effort is normal.     Breath sounds: Normal breath sounds.  Abdominal:     General: Bowel sounds are normal.     Palpations: Abdomen is soft.  Musculoskeletal:        General: No tenderness or deformity. Normal range of motion.     Cervical back: Normal range of motion.  Lymphadenopathy:     Cervical: No cervical adenopathy.  Skin:    General: Skin is warm and dry.     Findings: No erythema or rash.  Neurological:     Mental Status: She is alert and oriented to person, place, and time.     Comments: Neurological exam does show the weakness on the right side.  This does seem to be getting a little bit better.  Psychiatric:        Behavior: Behavior normal.        Thought Content: Thought content normal.        Judgment: Judgment normal.      Lab Results  Component Value Date   WBC 10.0 10/09/2019   HGB 11.5 (L) 10/09/2019   HCT 35.0 (L) 10/09/2019   MCV 94.6 10/09/2019   PLT 217 10/09/2019   Lab  Results  Component Value Date   FERRITIN 1,089 (H) 05/23/2019   IRON 31 (L) 05/23/2019   TIBC 155 (L) 05/23/2019   UIBC 124 05/23/2019   IRONPCTSAT 20 (L) 05/23/2019   Lab Results  Component Value Date   RBC 3.70 (L) 10/09/2019   No results found for: KPAFRELGTCHN, LAMBDASER, KAPLAMBRATIO No results found for: IGGSERUM, IGA, IGMSERUM No results found for: Odetta Pink, SPEI   Chemistry      Component Value Date/Time   NA 136 09/19/2019 0850   K 4.1 09/19/2019 0850   CL 104 09/19/2019 0850   CO2 26 09/19/2019 0850   BUN 15 09/19/2019 0850   CREATININE 0.48 09/19/2019 0850      Component Value Date/Time   CALCIUM 8.9 09/19/2019 0850   ALKPHOS 85 09/19/2019 0850   AST 13 (L) 09/19/2019 0850   ALT 17 09/19/2019 0850   BILITOT 0.3 09/19/2019 0850       Impression and Plan: Ms. Remmers is a very pleasant 64 yo female with metastatic adenocarcinoma of the right lung.   I really thought that when we saw her last time, that she was on the way to improving.  She now has taken a couple steps back.  I am not sure as to why she is although we certainly have to worry about the tumor being a problem for her.  Again, we will have to set up an MRI of the brain and a PET scan.  Hopefully the insurance will let us do a PET scan.  We really have to figure out what this pain is all about.  We will still proceed with the immunotherapy today.  I spent about 40 minutes with she and her husband.  Again this was a little bit complicated because of the new problem  with the pain.   Volanda Napoleon, MD 6/16/202110:54 AM

## 2019-10-09 NOTE — Patient Instructions (Signed)
[facility] Discharge Instructions for Patients Receiving Chemotherapy  Today you received the following chemotherapy agents Keytruda  To help prevent nausea and vomiting after your treatment, we encourage you to take your nausea medication as prescribed by MD.   If you develop nausea and vomiting that is not controlled by your nausea medication, call the clinic.   BELOW ARE SYMPTOMS THAT SHOULD BE REPORTED IMMEDIATELY:  *FEVER GREATER THAN 100.5 F  *CHILLS WITH OR WITHOUT FEVER  NAUSEA AND VOMITING THAT IS NOT CONTROLLED WITH YOUR NAUSEA MEDICATION  *UNUSUAL SHORTNESS OF BREATH  *UNUSUAL BRUISING OR BLEEDING  TENDERNESS IN MOUTH AND THROAT WITH OR WITHOUT PRESENCE OF ULCERS  *URINARY PROBLEMS  *BOWEL PROBLEMS  UNUSUAL RASH Items with * indicate a potential emergency and should be followed up as soon as possible.  Feel free to call the clinic should you have any questions or concerns. The clinic phone number is [phone].  Please show the [place] at check-in to the Emergency Department and triage nurse.

## 2019-10-09 NOTE — Patient Instructions (Signed)

## 2019-10-10 ENCOUNTER — Inpatient Hospital Stay: Payer: BC Managed Care – PPO | Admitting: Hematology & Oncology

## 2019-10-10 ENCOUNTER — Other Ambulatory Visit: Payer: Self-pay | Admitting: Family

## 2019-10-10 ENCOUNTER — Inpatient Hospital Stay: Payer: BC Managed Care – PPO

## 2019-10-10 DIAGNOSIS — C3490 Malignant neoplasm of unspecified part of unspecified bronchus or lung: Secondary | ICD-10-CM

## 2019-10-10 DIAGNOSIS — C7931 Secondary malignant neoplasm of brain: Secondary | ICD-10-CM

## 2019-10-10 DIAGNOSIS — C3491 Malignant neoplasm of unspecified part of right bronchus or lung: Secondary | ICD-10-CM

## 2019-10-10 LAB — TSH: TSH: 0.729 u[IU]/mL (ref 0.308–3.960)

## 2019-10-10 LAB — T4: T4, Total: 8.1 ug/dL (ref 4.5–12.0)

## 2019-10-15 ENCOUNTER — Telehealth: Payer: Self-pay | Admitting: Radiation Oncology

## 2019-10-15 NOTE — Telephone Encounter (Signed)
Received voicemail message from patient's husband, [name]. He explains that Brea is having some trouble reading so he took her to the eye doctor. He goes onto explain that the eye doctor determined there is nerve damage in her right eye and cataracts. Mr. Parkerson states, "one of the doctors wanted me to call and update them after the appointment." Since Mr. Albany is unsure of which physician wanted the update I assured him I would route it to both [place] and [place]. He verbalized understanding and appreciation for the assistance.

## 2019-10-16 ENCOUNTER — Other Ambulatory Visit: Payer: Self-pay | Admitting: *Deleted

## 2019-10-16 DIAGNOSIS — Z03818 Encounter for observation for suspected exposure to other biological agents ruled out: Secondary | ICD-10-CM | POA: Diagnosis not present

## 2019-10-16 DIAGNOSIS — Z20822 Contact with and (suspected) exposure to covid-19: Secondary | ICD-10-CM | POA: Diagnosis not present

## 2019-10-17 ENCOUNTER — Inpatient Hospital Stay (HOSPITAL_COMMUNITY)
Admit: 2019-10-17 | Discharge: 2019-10-17 | Disposition: A | Discharge status: Home / Self Care | Payer: BC Managed Care – PPO | Attending: Internal Medicine | Admitting: Internal Medicine

## 2019-10-17 ENCOUNTER — Inpatient Hospital Stay (HOSPITAL_COMMUNITY)
Admission: AD | Admit: 2019-10-17 | Discharge: 2019-10-18 | DRG: 080 | Disposition: A | Discharge status: Hospice | Payer: BC Managed Care – PPO | Attending: Family Medicine | Admitting: Family Medicine

## 2019-10-17 ENCOUNTER — Encounter (HOSPITAL_COMMUNITY): Payer: Self-pay

## 2019-10-17 ENCOUNTER — Inpatient Hospital Stay (HOSPITAL_COMMUNITY): Payer: BC Managed Care – PPO

## 2019-10-17 ENCOUNTER — Emergency Department (HOSPITAL_COMMUNITY): Payer: BC Managed Care – PPO

## 2019-10-17 ENCOUNTER — Other Ambulatory Visit: Payer: Self-pay

## 2019-10-17 DIAGNOSIS — R911 Solitary pulmonary nodule: Secondary | ICD-10-CM | POA: Diagnosis not present

## 2019-10-17 DIAGNOSIS — Z7901 Long term (current) use of anticoagulants: Secondary | ICD-10-CM | POA: Diagnosis not present

## 2019-10-17 DIAGNOSIS — G9349 Other encephalopathy: Secondary | ICD-10-CM | POA: Diagnosis not present

## 2019-10-17 DIAGNOSIS — G893 Neoplasm related pain (acute) (chronic): Secondary | ICD-10-CM

## 2019-10-17 DIAGNOSIS — R0602 Shortness of breath: Secondary | ICD-10-CM | POA: Diagnosis not present

## 2019-10-17 DIAGNOSIS — Z9221 Personal history of antineoplastic chemotherapy: Secondary | ICD-10-CM

## 2019-10-17 DIAGNOSIS — R52 Pain, unspecified: Secondary | ICD-10-CM | POA: Diagnosis not present

## 2019-10-17 DIAGNOSIS — Z86711 Personal history of pulmonary embolism: Secondary | ICD-10-CM | POA: Diagnosis not present

## 2019-10-17 DIAGNOSIS — G8191 Hemiplegia, unspecified affecting right dominant side: Secondary | ICD-10-CM | POA: Diagnosis present

## 2019-10-17 DIAGNOSIS — Z87891 Personal history of nicotine dependence: Secondary | ICD-10-CM | POA: Diagnosis not present

## 2019-10-17 DIAGNOSIS — C7931 Secondary malignant neoplasm of brain: Secondary | ICD-10-CM | POA: Diagnosis not present

## 2019-10-17 DIAGNOSIS — R29898 Other symptoms and signs involving the musculoskeletal system: Secondary | ICD-10-CM | POA: Diagnosis not present

## 2019-10-17 DIAGNOSIS — I2602 Saddle embolus of pulmonary artery with acute cor pulmonale: Secondary | ICD-10-CM

## 2019-10-17 DIAGNOSIS — R531 Weakness: Secondary | ICD-10-CM | POA: Diagnosis not present

## 2019-10-17 DIAGNOSIS — I82551 Chronic embolism and thrombosis of right peroneal vein: Secondary | ICD-10-CM | POA: Diagnosis not present

## 2019-10-17 DIAGNOSIS — R000 Tachycardia, unspecified: Secondary | ICD-10-CM | POA: Diagnosis not present

## 2019-10-17 DIAGNOSIS — C3412 Malignant neoplasm of upper lobe, left bronchus or lung: Secondary | ICD-10-CM | POA: Diagnosis not present

## 2019-10-17 DIAGNOSIS — R4182 Altered mental status, unspecified: Secondary | ICD-10-CM | POA: Diagnosis not present

## 2019-10-17 DIAGNOSIS — C7951 Secondary malignant neoplasm of bone: Secondary | ICD-10-CM | POA: Diagnosis present

## 2019-10-17 DIAGNOSIS — Z807 Family history of other malignant neoplasms of lymphoid, hematopoietic and related tissues: Secondary | ICD-10-CM | POA: Diagnosis not present

## 2019-10-17 DIAGNOSIS — G936 Cerebral edema: Secondary | ICD-10-CM | POA: Diagnosis not present

## 2019-10-17 DIAGNOSIS — Z823 Family history of stroke: Secondary | ICD-10-CM | POA: Diagnosis not present

## 2019-10-17 DIAGNOSIS — C3492 Malignant neoplasm of unspecified part of left bronchus or lung: Secondary | ICD-10-CM | POA: Diagnosis not present

## 2019-10-17 DIAGNOSIS — Z66 Do not resuscitate: Secondary | ICD-10-CM | POA: Diagnosis not present

## 2019-10-17 DIAGNOSIS — G9389 Other specified disorders of brain: Secondary | ICD-10-CM | POA: Diagnosis not present

## 2019-10-17 DIAGNOSIS — F05 Delirium due to known physiological condition: Secondary | ICD-10-CM | POA: Diagnosis not present

## 2019-10-17 DIAGNOSIS — I82409 Acute embolism and thrombosis of unspecified deep veins of unspecified lower extremity: Secondary | ICD-10-CM

## 2019-10-17 DIAGNOSIS — I82502 Chronic embolism and thrombosis of unspecified deep veins of left lower extremity: Secondary | ICD-10-CM | POA: Diagnosis not present

## 2019-10-17 DIAGNOSIS — I10 Essential (primary) hypertension: Secondary | ICD-10-CM | POA: Diagnosis not present

## 2019-10-17 DIAGNOSIS — I2699 Other pulmonary embolism without acute cor pulmonale: Secondary | ICD-10-CM | POA: Diagnosis present

## 2019-10-17 DIAGNOSIS — J90 Pleural effusion, not elsewhere classified: Secondary | ICD-10-CM | POA: Diagnosis not present

## 2019-10-17 DIAGNOSIS — Z8249 Family history of ischemic heart disease and other diseases of the circulatory system: Secondary | ICD-10-CM

## 2019-10-17 DIAGNOSIS — Z20822 Contact with and (suspected) exposure to covid-19: Secondary | ICD-10-CM | POA: Diagnosis present

## 2019-10-17 DIAGNOSIS — Z515 Encounter for palliative care: Secondary | ICD-10-CM | POA: Diagnosis not present

## 2019-10-17 DIAGNOSIS — Z79899 Other long term (current) drug therapy: Secondary | ICD-10-CM

## 2019-10-17 DIAGNOSIS — R404 Transient alteration of awareness: Secondary | ICD-10-CM | POA: Diagnosis not present

## 2019-10-17 DIAGNOSIS — R400 Somnolence: Secondary | ICD-10-CM

## 2019-10-17 DIAGNOSIS — R0902 Hypoxemia: Secondary | ICD-10-CM | POA: Diagnosis not present

## 2019-10-17 DIAGNOSIS — Z923 Personal history of irradiation: Secondary | ICD-10-CM | POA: Diagnosis not present

## 2019-10-17 DIAGNOSIS — Z7189 Other specified counseling: Secondary | ICD-10-CM | POA: Diagnosis not present

## 2019-10-17 DIAGNOSIS — J9601 Acute respiratory failure with hypoxia: Secondary | ICD-10-CM | POA: Diagnosis not present

## 2019-10-17 DIAGNOSIS — Z681 Body mass index (BMI) 19 or less, adult: Secondary | ICD-10-CM

## 2019-10-17 DIAGNOSIS — R64 Cachexia: Secondary | ICD-10-CM | POA: Diagnosis not present

## 2019-10-17 DIAGNOSIS — T451X5A Adverse effect of antineoplastic and immunosuppressive drugs, initial encounter: Secondary | ICD-10-CM

## 2019-10-17 DIAGNOSIS — C3491 Malignant neoplasm of unspecified part of right bronchus or lung: Secondary | ICD-10-CM | POA: Diagnosis not present

## 2019-10-17 LAB — URINALYSIS, ROUTINE W REFLEX MICROSCOPIC
Bilirubin Urine: NEGATIVE
Glucose, UA: NEGATIVE mg/dL
Hgb urine dipstick: NEGATIVE
Ketones, ur: NEGATIVE mg/dL
Leukocytes,Ua: NEGATIVE
Nitrite: NEGATIVE
Protein, ur: NEGATIVE mg/dL
Specific Gravity, Urine: 1.015 (ref 1.005–1.030)
pH: 6 (ref 5.0–8.0)

## 2019-10-17 LAB — CBC WITH DIFFERENTIAL/PLATELET
Abs Immature Granulocytes: 0.22 10*3/uL — ABNORMAL HIGH (ref 0.00–0.07)
Basophils Absolute: 0.1 10*3/uL (ref 0.0–0.1)
Basophils Relative: 1 %
Eosinophils Absolute: 0 10*3/uL (ref 0.0–0.5)
Eosinophils Relative: 0 %
HCT: 32.5 % — ABNORMAL LOW (ref 36.0–46.0)
Hemoglobin: 10.5 g/dL — ABNORMAL LOW (ref 12.0–15.0)
Immature Granulocytes: 2 %
Lymphocytes Relative: 6 %
Lymphs Abs: 0.7 10*3/uL (ref 0.7–4.0)
MCH: 31.1 pg (ref 26.0–34.0)
MCHC: 32.3 g/dL (ref 30.0–36.0)
MCV: 96.2 fL (ref 80.0–100.0)
Monocytes Absolute: 0.9 10*3/uL (ref 0.1–1.0)
Monocytes Relative: 8 %
Neutro Abs: 8.7 10*3/uL — ABNORMAL HIGH (ref 1.7–7.7)
Neutrophils Relative %: 83 %
Platelets: 296 10*3/uL (ref 150–400)
RBC: 3.38 MIL/uL — ABNORMAL LOW (ref 3.87–5.11)
RDW: 14.7 % (ref 11.5–15.5)
WBC: 10.5 10*3/uL (ref 4.0–10.5)
nRBC: 0 % (ref 0.0–0.2)

## 2019-10-17 LAB — COMPREHENSIVE METABOLIC PANEL
ALT: 13 U/L (ref 0–44)
AST: 13 U/L — ABNORMAL LOW (ref 15–41)
Albumin: 3 g/dL — ABNORMAL LOW (ref 3.5–5.0)
Alkaline Phosphatase: 100 U/L (ref 38–126)
Anion gap: 10 (ref 5–15)
BUN: 10 mg/dL (ref 8–23)
CO2: 23 mmol/L (ref 22–32)
Calcium: 8.4 mg/dL — ABNORMAL LOW (ref 8.9–10.3)
Chloride: 100 mmol/L (ref 98–111)
Creatinine, Ser: 0.34 mg/dL — ABNORMAL LOW (ref 0.44–1.00)
GFR calc Af Amer: >60 mL/min (ref >60)
GFR calc non Af Amer: >60 mL/min (ref >60)
Glucose, Bld: 99 mg/dL (ref 70–99)
Potassium: 3.7 mmol/L (ref 3.5–5.1)
Sodium: 133 mmol/L — ABNORMAL LOW (ref 135–145)
Total Bilirubin: 0.7 mg/dL (ref 0.3–1.2)
Total Protein: 6.1 g/dL — ABNORMAL LOW (ref 6.5–8.1)

## 2019-10-17 LAB — PROTIME-INR
INR: 1.3 — ABNORMAL HIGH (ref 0.8–1.2)
Prothrombin Time: 15.2 seconds (ref 11.4–15.2)

## 2019-10-17 LAB — SARS CORONAVIRUS 2 BY RT PCR (HOSPITAL ORDER, PERFORMED IN ~~LOC~~ HOSPITAL LAB): SARS Coronavirus 2: NEGATIVE

## 2019-10-17 LAB — LACTIC ACID, PLASMA
Lactic Acid, Venous: 0.6 mmol/L (ref 0.5–1.9)
Lactic Acid, Venous: 0.6 mmol/L (ref 0.5–1.9)

## 2019-10-17 LAB — HEPARIN LEVEL (UNFRACTIONATED): Heparin Unfractionated: 0.16 IU/mL — ABNORMAL LOW (ref 0.30–0.70)

## 2019-10-17 LAB — APTT: aPTT: 49 seconds — ABNORMAL HIGH (ref 24–36)

## 2019-10-17 LAB — TROPONIN I (HIGH SENSITIVITY)
Troponin I (High Sensitivity): 10 ng/L (ref <18)
Troponin I (High Sensitivity): 9 ng/L (ref <18)

## 2019-10-17 LAB — BRAIN NATRIURETIC PEPTIDE: B Natriuretic Peptide: 53 pg/mL (ref 0.0–100.0)

## 2019-10-17 LAB — ECHOCARDIOGRAM LIMITED
Height: 67 in
Weight: 1840 oz

## 2019-10-17 MED ORDER — ONDANSETRON HCL 4 MG/2ML IJ SOLN: 4.0000 mg | Freq: Four times a day (QID) | INTRAMUSCULAR | Status: DC | PRN

## 2019-10-17 MED ORDER — DEXAMETHASONE SODIUM PHOSPHATE 4 MG/ML IJ SOLN: 4.0000 mg | Freq: Four times a day (QID) | INTRAMUSCULAR | Status: DC

## 2019-10-17 MED ORDER — LEVETIRACETAM IN NACL 1000 MG/100ML IV SOLN
1000.0000 mg | Freq: Once | INTRAVENOUS | Status: AC
  Administered 2019-10-17: 1000 mg via INTRAVENOUS
  Filled 2019-10-17: qty 100

## 2019-10-17 MED ORDER — HYDROMORPHONE HCL 2 MG/ML IJ SOLN
3.0000 mg | INTRAMUSCULAR | Status: DC | PRN
  Administered 2019-10-17: 3 mg via INTRAVENOUS
  Filled 2019-10-17 (×2): qty 2

## 2019-10-17 MED ORDER — IOHEXOL 350 MG/ML SOLN
100.0000 mL | Freq: Once | INTRAVENOUS | Status: AC | PRN
  Administered 2019-10-17: 80 mL via INTRAVENOUS

## 2019-10-17 MED ORDER — FENTANYL CITRATE (PF) 100 MCG/2ML IJ SOLN
INTRAMUSCULAR | Status: AC
  Administered 2019-10-17: 50 ug via INTRAVENOUS
  Filled 2019-10-17: qty 2

## 2019-10-17 MED ORDER — DEXAMETHASONE SODIUM PHOSPHATE 10 MG/ML IJ SOLN
10.0000 mg | Freq: Once | INTRAMUSCULAR | Status: AC
  Administered 2019-10-17: 10 mg via INTRAVENOUS
  Filled 2019-10-17: qty 1

## 2019-10-17 MED ORDER — FENTANYL CITRATE (PF) 100 MCG/2ML IJ SOLN
50.0000 ug | INTRAMUSCULAR | Status: DC | PRN
  Administered 2019-10-17: 50 ug via INTRAVENOUS

## 2019-10-17 MED ORDER — FENTANYL CITRATE (PF) 100 MCG/2ML IJ SOLN
50.0000 ug | INTRAMUSCULAR | Status: DC | PRN
  Administered 2019-10-17: 50 ug via INTRAVENOUS
  Filled 2019-10-17: qty 2

## 2019-10-17 MED ORDER — ONDANSETRON HCL 4 MG PO TABS: 4.0000 mg | ORAL_TABLET | Freq: Four times a day (QID) | ORAL | Status: DC | PRN

## 2019-10-17 MED ORDER — DEXAMETHASONE SODIUM PHOSPHATE 10 MG/ML IJ SOLN
20.0000 mg | Freq: Two times a day (BID) | INTRAMUSCULAR | Status: DC
  Administered 2019-10-17: 20 mg via INTRAVENOUS
  Filled 2019-10-17: qty 2

## 2019-10-17 MED ORDER — VANCOMYCIN HCL IN DEXTROSE 1-5 GM/200ML-% IV SOLN
1000.0000 mg | Freq: Once | INTRAVENOUS | Status: AC
  Administered 2019-10-17: 1000 mg via INTRAVENOUS
  Filled 2019-10-17: qty 200

## 2019-10-17 MED ORDER — HEPARIN (PORCINE) 25000 UT/250ML-% IV SOLN
1000.0000 [IU]/h | INTRAVENOUS | Status: DC
  Administered 2019-10-17: 850 [IU]/h via INTRAVENOUS
  Filled 2019-10-17 (×2): qty 250

## 2019-10-17 MED ORDER — FENTANYL 75 MCG/HR TD PT72
1.0000 | MEDICATED_PATCH | TRANSDERMAL | Status: DC
  Administered 2019-10-17: 1 via TRANSDERMAL
  Filled 2019-10-17: qty 1

## 2019-10-17 MED ORDER — CHLORHEXIDINE GLUCONATE CLOTH 2 % EX PADS: 6.0000 | MEDICATED_PAD | Freq: Every day | CUTANEOUS | Status: DC

## 2019-10-17 MED ORDER — PREDNISONE 20 MG PO TABS: 60.0000 mg | ORAL_TABLET | Freq: Once | ORAL | Status: DC

## 2019-10-17 MED ORDER — PIPERACILLIN-TAZOBACTAM 3.375 G IVPB 30 MIN
3.3750 g | Freq: Once | INTRAVENOUS | Status: AC
  Administered 2019-10-17: 3.375 g via INTRAVENOUS
  Filled 2019-10-17: qty 50

## 2019-10-17 MED ORDER — LACTATED RINGERS IV BOLUS
1000.0000 mL | Freq: Once | INTRAVENOUS | Status: AC
  Administered 2019-10-17: 1000 mL via INTRAVENOUS

## 2019-10-17 MED ORDER — VANCOMYCIN HCL 10 G IV SOLR: 20.0000 mg/kg | Freq: Once | INTRAVENOUS | Status: DC

## 2019-10-17 MED ORDER — HYDROMORPHONE HCL 1 MG/ML IJ SOLN
3.0000 mg | INTRAMUSCULAR | Status: DC | PRN
  Administered 2019-10-17 – 2019-10-18 (×2): 3 mg via INTRAVENOUS
  Filled 2019-10-17 (×2): qty 3

## 2019-10-17 MED ORDER — SODIUM CHLORIDE (PF) 0.9 % IJ SOLN
INTRAMUSCULAR | Status: AC
  Filled 2019-10-17: qty 50

## 2019-10-17 NOTE — ED Notes (Signed)
Pt's husband at bedside.  Reports mental status changes took place over 6 hours.  Sts she has been complaining of rib cage pain and is scheduled for a PET Scan on Friday.

## 2019-10-17 NOTE — Progress Notes (Signed)
  Radiation Oncology         [phone] ________________________________  Name: [name] MRN: [id number]  Date: 10/17/2019  DOB: April 07, 1956  Chart Note:  I reviewed this patient's most recent findings and wanted to take a minute to document my impression.  She received SRS for 12 brain metastasis to a prescription dose of 18-20 Gy on 08/23/19.  She was subsequently tapered off of decadron.  She now presents with altered mental status. CT and MRI without contrast shows increased cerebral edema.   Impression:  The patient has increased edema 7 weeks after SRS.  This could be a subacute reaction to radiation which may be reversible by resuming decadron at a level of at least 4 mg QID.  The findings may alternatively represent tumor progression or new small metastases.  It is difficult to assess for tumor activity in the absence of contrast.  Plan:  At this point, the patient is set up to resume decadron.  If her presenting symptoms resolve, I would suggest a slower decadron taper and repeat brain imaging with contrast as an outpatient in 4-6 weeks.  ________________________________  Sheral Apley Tammi Klippel, M.D.

## 2019-10-17 NOTE — Progress Notes (Signed)
  Echocardiogram 2D Echocardiogram has been performed.  Randa Lynn Anubis Fundora 10/17/2019, 1:43 PM

## 2019-10-17 NOTE — ED Notes (Signed)
Patient transported to MRI 

## 2019-10-17 NOTE — Consult Note (Signed)
Consultation Note Date: 10/17/2019   Patient Name: [name]  DOB: [birth year]  MRN: [id number]  Age / Sex: 64 y.o., female  PCP: Judy Nigh, MD Referring Physician: Desiree Hane, MD  Reason for Consultation: Establishing goals of care and Psychosocial/spiritual support  HPI/Patient Profile: 64 y.o. female   admitted on 10/17/2019 with past  medical history significant for lung cancer with metastasis to spine and brain who presents on 10/17/2019 with worsening confusion and hypoxia.  History provided by husband.  Patient was in her usual state of health until 6/23 around 6 PM when she noted right hand tingling.   Patient has baseline right-sided weakness related to known cerebral mets  Early morning on 6/24 patient was noted to be extremely weak with minimal verbal interaction.  Husband called EMS immediately.    Patient and admitted for treatment and stabilization and pain control.  Patient is s/p  brain radiation therapy with last session on 07/2019.  Followed by Dr. Marin [name] (oncology) who had discussed on last outpatient visit on 10/07/2019 either continuing chemotherapy or hospice care based off MRI brain results from April 2021 showing new cerebellar/cerebral metastases vasogenic edema.  Brain MRI IMPRESSION:  10-17-19 1. Incomplete study. Patinet unable to tolerate lying still in the scanner for the duration of the study. 2. Prominent multifocal areas of vasogenic edema with a 2 mm leftward midline shift not significantly changed from prior CT performed earlier today. 3. Compared to MRI performed in April 2021, areas of increased vasogenic edema are seen in the posterior right frontal and parietal regions and left temporal lobe while a decreasing vasogenic is noted in the bilateral anterior frontal lobes.  Currently patient is resting confortably, however she is lethargic with minimal verbal  interaction.  Patient and family face treatment option decisions, advanced directive decisions and anticipatory care needs.    Clinical Assessment and Goals of Care:   This NP [name] reviewed medical records, received report from team,  and then meet at the patient's bedside with her husband [name] to discuss diagnosis, prognosis, GOC, EOL wishes disposition and options.   Concept of Palliative Care was introduced as specialized medical care for people and their families living with serious illness.  If focuses on providing relief from the symptoms and stress of a serious illness.  The goal is to improve quality of life for both the patient and the family.  I have  worked with [name] and her husband on past admissions.  Created space and opportunity for husband to explore his thoughts and feeling regarding his wife's current medical situation.  Unfortunately [name] has continued to decline despite all attempts to control her cancer progression.  Her husband verbalizes understanding that there is not anything more to offer.   Comfort and dignity are a priority   MOST form was introduced   A  discussion was had today regarding advanced directives.  Concepts specific to code status, artifical feeding and hydration, continued IV antibiotics and rehospitalization was had.    The difference between  a aggressive medical intervention path  and a palliative comfort care path for this patient at this time was had.  Values and goals of care important to patient and family were attempted to be elicited.   Natural trajectory and expectations at EOL were discussed.   Plan is to continue to treat the treatable over the next few days  and hope for improvement.     Consideration is  home with hospice services     Questions and concerns addressed.   Emotional support offered       HPOA/ Husband [name] is main support and decision maker    SUMMARY OF RECOMMENDATIONS    Code  Status/Advance Care Planning:  DNR   Symptom Management:  A discussion was had today including Dr [name] and the attending physician.  Current medication regiment is controlling [name] pain  Pain:Duragesic patch totally 150 mcg every 48 hours  Dilaudid 3 mg IV every 4 hrs prn  Ongoing assessment and adjustment to maximize comfort.  Palliative Prophylaxis:   Bowel Regimen, Eye Care, Frequent Pain Assessment and Oral Care  Additional Recommendations (Limitations, Scope, Preferences):  Full Scope Treatment  Psycho-social/Spiritual:   Desire for further Chaplaincy support:no  Additional Recommendations: Education on Hospice  Prognosis:   < 3 months  Discharge Planning: To Be Determined      Primary Diagnoses: Present on Admission: . Cerebral edema (HCC) . Bone metastases ([place]) . Metastatic cancer to brain ([place]) . Pain from bone metastases (HCC) . Altered mental state . Acute respiratory failure with hypoxia ([place]) . Right arm weakness . Right leg weakness . Chronic deep vein thrombosis (DVT) of right peroneal vein (HCC)   I have reviewed the medical record, interviewed the patient and family, and examined the patient. The following aspects are pertinent.  Past Medical History:  Diagnosis Date  . Bronchitis   . Goals of care, counseling/discussion 04/24/2019  . Hypertension   . Lung cancer ([place])    with painful bone mets  . Osteoarthritis of metacarpophalangeal (MCP) joint of left thumb   . Pelvic ring fracture ([place]) 09/2016   left   Social History   Socioeconomic History  . Marital status: Married    Spouse name: [name]  . Number of children: Not on file  . Years of education: Not on file  . Highest education level: Not on file  Occupational History  . Not on file  Tobacco Use  . Smoking status: Former Smoker    Packs/day: 0.50    Years: 25.00    Pack years: 12.50    Types: Cigarettes    Quit date: 06/11/2019    Years since quitting: 0.3  .  Smokeless tobacco: Never Used  Vaping Use  . Vaping Use: Never used  Substance and Sexual Activity  . Alcohol use: No    Alcohol/week: 0.0 standard drinks  . Drug use: Yes    Types: Marijuana    Comment: when needed  . Sexual activity: Not Currently    Birth control/protection: Post-menopausal  Other Topics Concern  . Not on file  Social History Narrative  . Not on file   Social Determinants of Health   Financial Resource Strain:   . Difficulty of Paying Living Expenses:   Food Insecurity:   . Worried About Charity fundraiser in the Last Year:   . Arboriculturist in the Last Year:   Transportation Needs:   . Film/video editor (Medical):   Marland Kitchen Lack of Transportation (  Non-Medical):   Physical Activity:   . Days of Exercise per Week:   . Minutes of Exercise per Session:   Stress:   . Feeling of Stress :   Social Connections:   . Frequency of Communication with Friends and Family:   . Frequency of Social Gatherings with Friends and Family:   . Attends Religious Services:   . Active Member of Clubs or Organizations:   . Attends Archivist Meetings:   Marland Kitchen Marital Status:    Family History  Problem Relation Age of Onset  . CAD Father        CABG  . Stroke Father   . Lymphoma Brother   . Breast cancer Neg Hx   . Pancreatic cancer Neg Hx   . Prostate cancer Neg Hx   . Colon cancer Neg Hx    Scheduled Meds: . dexamethasone (DECADRON) injection  20 mg Intravenous Q12H  . fentaNYL  1 patch Transdermal Q48H   And  . fentaNYL  1 patch Transdermal Q48H  . sodium chloride (PF)       Continuous Infusions: . heparin 850 Units/hr (10/17/19 1146)   PRN Meds:.HYDROmorphone (DILAUDID) injection, ondansetron **OR** ondansetron (ZOFRAN) IV Medications Prior to Admission:  Prior to Admission medications   Medication Sig Start Date End Date Taking? Authorizing Provider  acetaminophen (TYLENOL) 325 MG tablet Take 2 tablets (650 mg total) by mouth every 6 (six) hours.  04/22/19  Yes Love, Ivan Anchors, PA-C  dabigatran (PRADAXA) 150 MG CAPS capsule Take 1 capsule (150 mg total) by mouth 2 (two) times daily. 08/08/19  Yes Volanda Napoleon, MD  dronabinol (MARINOL) 5 MG capsule Take 1 capsule (5 mg total) by mouth 2 (two) times daily before lunch and supper. 09/02/19  Yes Ennever, Rudell Cobb, MD  feeding supplement, ENSURE ENLIVE, (ENSURE ENLIVE) LIQD Take 237 mLs by mouth 2 (two) times daily between meals. 04/11/19  Yes Viona [name] D, NP  fentaNYL (DURAGESIC) 100 MCG/HR Place 2 patches onto the skin every other day.   Yes [provider]  folic acid (FOLVITE) 1 MG tablet Take 1 tablet (1 mg total) by mouth daily. Start 5-7 days before Alimta chemotherapy. Continue until 21 days after Alimta completed. 07/17/19  Yes Volanda Napoleon, MD  gabapentin (NEURONTIN) 300 MG capsule Take 1 capsule (300 mg total) by mouth 3 (three) times daily. Patient taking differently: Take 300 mg by mouth 3 (three) times daily as needed.  06/19/19  Yes Ennever, Rudell Cobb, MD  lactulose (CHRONULAC) 10 GM/15ML solution Take 30 mLs (20 g total) by mouth 2 (two) times daily. 05/06/19  Yes Volanda Napoleon, MD  lidocaine-prilocaine (EMLA) cream Apply to affected area once 07/03/19  Yes Ennever, Rudell Cobb, MD  lip balm (CARMEX) ointment Apply topically as needed for lip care. 05/17/19  Yes Swayze, Ava, DO  loratadine (CLARITIN) 10 MG tablet Take 1 tablet (10 mg total) by mouth daily. Patient taking differently: Take 10 mg by mouth daily as needed for allergies.  05/17/19  Yes Swayze, Ava, DO  LORazepam (ATIVAN) 1 MG tablet Take 1 tablet (1 mg total) by mouth every 6 (six) hours as needed for anxiety. 09/25/19  Yes Ennever, Rudell Cobb, MD  methocarbamol (ROBAXIN) 500 MG tablet Take 1 tablet (500 mg total) by mouth every 6 (six) hours as needed for muscle spasms. 05/21/19  Yes Volanda Napoleon, MD  morphine (MSIR) 15 MG tablet Take 1 tablet (15 mg total) by mouth every 4 (four)  hours as needed for severe  pain. Patient taking differently: Take 30 mg by mouth every 4 (four) hours as needed for severe pain.  10/03/19  Yes Volanda Napoleon, MD  Multiple Vitamin (MULTIVITAMIN WITH MINERALS) TABS tablet Take 1 tablet by mouth daily. 05/17/19  Yes Swayze, Ava, DO  OLANZapine (ZYPREXA) 10 MG tablet Take 1 tablet (10 mg total) by mouth at bedtime. 09/20/19  Yes [place], Holli Humbles, NP  ondansetron (ZOFRAN ODT) 8 MG disintegrating tablet Take 1 tablet (8 mg total) by mouth every 8 (eight) hours as needed for nausea or vomiting. 07/17/19  Yes Ennever, Rudell Cobb, MD  oxymetazoline (AFRIN) 0.05 % nasal spray Place 1 spray into both nostrils 2 (two) times daily as needed for congestion.   Yes [provider]  pantoprazole (PROTONIX) 40 MG tablet TAKE 1 TABLET (40 MG TOTAL) BY MOUTH DAILY WITH SUPPER. 08/28/19  Yes Ennever, Rudell Cobb, MD  polyvinyl alcohol (LIQUIFILM TEARS) 1.4 % ophthalmic solution Place 1 drop into both eyes as needed for dry eyes.   Yes [provider]  promethazine (PHENERGAN) 12.5 MG tablet TAKE 1-2 TABLETS BY MOUTH EVERY 4 (FOUR) HOURS AS NEEDED FOR REFRACTORY NAUSEA / VOMITING. Patient taking differently: Take 12.5-25 mg by mouth every 4 (four) hours as needed for refractory nausea / vomiting.  06/06/19  Yes Ennever, Rudell Cobb, MD  senna-docusate (SENOKOT-S) 8.6-50 MG tablet Take 1 tablet by mouth 2 (two) times daily. 08/15/19  Yes Volanda Napoleon, MD  dexamethasone (DECADRON) 2 MG tablet Take decadron 2 mg once daily until 10/10/2019 then half tablet (79m) daily for seven days and STOP. Patient taking differently: Take 0.5 mg by mouth 7 days.  10/04/19   MTyler Pita MD  NARCAN 4 MG/0.1ML LIQD nasal spray kit Place 1 spray into the nose once as needed (overdose).  Patient not taking: Reported on 10/09/2019 04/23/19   [provider]   No Known Allergies Review of Systems  Unable to perform ROS: Mental status change  Endocrine: Positive for cold intolerance.    Physical  Exam Constitutional:      Appearance: She is cachectic. She is ill-appearing.  Cardiovascular:     Rate and Rhythm: Normal rate.  Pulmonary:     Effort: Pulmonary effort is normal.  Musculoskeletal:     Comments: generalized weakness  Skin:    General: Skin is warm and dry.  Neurological:     Mental Status: She is lethargic.     Vital Signs: BP 96/63   Pulse 73   Temp 100.2 F (37.9 C) (Rectal)   Resp 11   Ht _0  (1.702 m)   Wt 52.2 kg   SpO2 95%   BMI 18.01 kg/m  Pain Scale: PAINAD   Pain Score: 0-No pain   SpO2: SpO2: 95 % O2 Device:SpO2: 95 % O2 Flow Rate: .O2 Flow Rate (L/min): 2 L/min  IO: Intake/output summary:   Intake/Output Summary (Last 24 hours) at 10/17/2019 1359 Last data filed at 10/17/2019 1239 Gross per 24 hour  Intake 1600 ml  Output 1500 ml  Net 100 ml    LBM:   Baseline Weight: Weight: 52.2 kg Most recent weight: Weight: 52.2 kg     Palliative Assessment/Data: 30 % at best   Discussed with bedside RN  This nurse practitioner informed  family that I will be out of the hospital until Monday morning.  If the patient is still hospitalized I will follow-up at that time.   I will  discuss with my colleague/ Dr Loistine Chance and ask her to follow thru the week-end.  Call palliative medicine team phone # [phone] with questions or concerns.  Time In: 1230 Time Out: 1345 Time Total: 75 minutes Greater than 50%  of this time was spent counseling and coordinating care related to the above assessment and plan.   Signed by: Judy Lessen, NP   Please contact Palliative Medicine Team phone at [phone] for questions and concerns.  For individual provider: See Shea Evans

## 2019-10-17 NOTE — ED Notes (Addendum)
Dr. Lonny Prude paged in regards to patient requesting food.

## 2019-10-17 NOTE — ED Notes (Addendum)
During triage, O2 sat dropped to 82%.  Placed 3L [place].  O2 increased to 98%.    Per chart review, Pt had a Keytruda infusion on 6/16.

## 2019-10-17 NOTE — ED Notes (Signed)
Blood in lab for BNP and Troponin.

## 2019-10-17 NOTE — ED Notes (Signed)
ED Provider at bedside. 

## 2019-10-17 NOTE — ED Notes (Signed)
Fentanyl 50 mcg IV given at 1050. 50 mcg Fentanyl wasted and witnessed by Coral Else, RN. Would not let me waste in pyxis.

## 2019-10-17 NOTE — Progress Notes (Signed)
Pt was able to complete partial MRI Brain without. Pt in severe pain and confused. Pt given pain meds prior to coming to MRI. Completing order without for radiologist to dictate.

## 2019-10-17 NOTE — Progress Notes (Signed)
EEG completed, results pending. 

## 2019-10-17 NOTE — Progress Notes (Signed)
PCP on call gave order for patient's husband Rosamond Andress)  to stay with the patient overnight.

## 2019-10-17 NOTE — ED Notes (Signed)
Patient is the process of the EEG. Patient began thrashing in the bed and moaning. Patient's husband came out and wanted the patient to have pain meds. Fentanyl was discontinued. A call placed to Dr. Lonny Prude.

## 2019-10-17 NOTE — Progress Notes (Signed)
ANTICOAGULATION CONSULT NOTE   Pharmacy Consult for heparin Indication: acute bilateral pulmonary embolus and hx DVT  No Known Allergies  Patient Measurements: Height: 5\' 7"  (170.2 cm) Weight: 52.2 kg (115 lb) IBW/kg (Calculated) : 61.6 Heparin Dosing Weight: 52 kg  Vital Signs: Temp: 100.2 F (37.9 C) (06/24 0541) Temp Source: Rectal (06/24 0541) BP: 86/57 (06/24 1000) Pulse Rate: 91 (06/24 1005)  Labs: Recent Labs    10/17/19 0519 10/17/19 0529 10/17/19 0823  HGB 10.5*  --   --   HCT 32.5*  --   --   PLT 296  --   --   APTT 49*  --   --   LABPROT 15.2  --   --   INR 1.3*  --   --   CREATININE 0.34*  --   --   TROPONINIHS  --  10 9    Estimated Creatinine Clearance: 59.3 mL/min (A) (by C-G formula based on SCr of 0.34 mg/dL (L)).   Medications:  - on Pradaxa 150 mg bid PTA (last dose taken on 6/23 at 8PM)  Assessment: Patient's a 64 y.o F with metastatic lung cancer to the bone and brain, presented to the ED on 6/24 with c/o AMS and was found to be hypoxic in the ED.  Chest CTA showed acute bilateral PE with suspected right heart strain.  Pharmacy is consulted to start heparin for acute PE.  Today, 10/17/2019: - hgb 10.5, plts 296 - scr 0.34 (crcl~59)   Goal of Therapy:  Heparin level 0.3-0.7 units/ml Monitor platelets by anticoagulation protocol: Yes   Plan:  - start heparin drip at 850 units/hr (no bolus per MD's request d/t brain mets) - check 6 hr heparin level - monitor for s/sx bleeding  Amor Hyle P 10/17/2019,10:24 AM

## 2019-10-17 NOTE — Procedures (Signed)
Patient Name: [name]  MRN: [id number]  Epilepsy Attending: Lora Havens  Referring Physician/Provider: Dr Oretha Milch Date: 10/17/2019  Duration: 24.32 mins  Patient history: 63yo F altered mental status, worsening cerebral vasogenic edema with midline shift in setting of known cerebral metastasis. EEG to evaluate for seizure.   Level of alertness: Lethargic  AEDs during EEG study: None  Technical aspects: This EEG study was done with scalp electrodes positioned according to the 10-20 International system of electrode placement. Electrical activity was acquired at a sampling rate of 500Hz  and reviewed with a high frequency filter of 70Hz  and a low frequency filter of 1Hz . EEG data were recorded continuously and digitally stored.   Description: No posterior dominant rhythm was seen. EEG showed continuous generalized polymorphic 5-9hz  theta-alpha activity. Hyperventilation and photic stimulation were not performed.     ABNORMALITY -Continuous slow, generalized  IMPRESSION: This study is suggestive of mild to moderate diffuse encephalopathy, nonspecific etiology. No seizures or epileptiform discharges were seen throughout the recording.  Charistopher Rumble [name]

## 2019-10-17 NOTE — ED Notes (Signed)
Sats on O2 2L/min via [name] 83% O2 increased to 4L/min via [name] Sats increased to 95%.

## 2019-10-17 NOTE — H&P (Addendum)
[facility] History and Physical    [name], is a 64 y.o. female  MRN: [id number]   DOB - Oct 06, 1955  Admit Date - 10/17/2019  Outpatient Primary MD for the patient is [name], MD  Outpatient Specialists: Dr. Marin Olp ( Oncology)  Patient coming from: home  Chief Complaint: confusion and low O2  HPI: [name] is a 64 y.o. female with medical history significant for lung cancer with metastasis to spine and brain who presents on 10/17/2019 with worsening confusion and hypoxia.  History provided by husband at bedside as patient is unable to provide story due to altered mental status.  Husband states his wife was in her usual state of health until 6/23 around 6 PM when she noted right hand tingling.  He immediately called EMS, during the evaluation patient had normal vitals, normal oxygen saturation also not to have any new neurologic deficits.  Patient has baseline right-sided weakness related to known cerebral mets following diet patient became more fatigued and went to bed.  Early morning on 6/24 patient was noted to be extremely weak with minimal verbal interaction.  Husband called EMS immediately.  Denied any fevers, chills, preceding headaches, changes in vision, cough, shortness of breath, abdominal pain, nausea, vomiting  Regarding her lung cancer with metastatic lesions to the brain and spine.  She underwent brain radiation therapy with last session on 07/2019.  Followed by Dr. Marin Olp (oncology) who had discussed on last outpatient visit on 10/07/2019 either continuing chemotherapy or hospice care based off MRI brain results from April 2021 showing new cerebellar/cerebral metastases vasogenic edema  Chronic pain.  Patient's Duragesic patch have been increased by her oncologist to every 2 days dosing.  She also takes morphine immediate release 15 mg every 4 hours as needed, Robaxin muscle spasms as needed, and gabapentin.  No other changes to dosing prior to  hospitalization    ED Course: Afebrile.  SPO2 of 70% on room air on 2 L with improvement in SPO2.    Lab work notable for Covid PCR test negative, Sodium 133, WBC 10.5, hemoglobin 10.5.  Chest x-ray with findings concerning for pulmonary edema.    CTA chest showed large filling defect in lower lobe branch of left pulmonary artery consistent with PE with possible right heart strain, as well as left lower lobe opacity concerning for either atelectasis pneumonia or possible pulmonary infarction.  Also noted to fine increase as of left upper lobe mass, and new right lower lobe nodule.  CT head concerning for extensive multifocal vasogenic edema in the setting of known cerebral/cerebellar metastases with asymmetric mass-effect and unchanged 2 mm leftward midline shift   Review of Systems:   In addition to the HPI above,  Constitutional:  No weight loss, night sweats, Fevers, chills, fatigue.  HEENT:  No headaches, Difficulty swallowing,Tooth/dental problems,Sore throat,  No sneezing, itching, ear ache, nasal congestion, post nasal drip,  Cardio-vascular:  No chest pain, Orthopnea, PND, swelling in lower extremities, anasarca, dizziness, palpitations  GI:  No heartburn, indigestion, abdominal pain, nausea, vomiting, diarrhea, change in bowel habits, loss of appetite  Resp:  No shortness of breath with exertion or at rest. No excess mucus, no productive cough, No non-productive cough, No coughing up of blood.No change in color of mucus.No wheezing.No chest wall deformity  Skin:  no rash or lesions.  GU:  no dysuria, change in color of urine, no urgency or frequency. No flank pain.  Musculoskeletal:  No joint pain or swelling. No decreased range  of motion. No back pain.  Psych:  No change in mood or affect. No depression or anxiety. No memory loss.   A full 10 point Review of Systems was done, except as stated above, all other Review of Systems were negative.  Past Medical History:    Diagnosis Date  . Bronchitis   . Goals of care, counseling/discussion 04/24/2019  . Hypertension   . Lung cancer ([place])    with painful bone mets  . Osteoarthritis of metacarpophalangeal (MCP) joint of left thumb   . Pelvic ring fracture ([place]) 09/2016   left   Past Surgical History:  Procedure Laterality Date  . APPLICATION OF CRANIAL NAVIGATION Left 04/05/2019   Procedure: APPLICATION OF CRANIAL NAVIGATION;  Surgeon: Earnie Larsson, MD;  Location: [place];  Service: Neurosurgery;  Laterality: Left;  . CRANIOTOMY Left 04/05/2019   Procedure: LEFT FRONTAL CRANIOTOMY TUMOR EXCISION w/Brain Lab;  Surgeon: Earnie Larsson, MD;  Location: [facility];  Service: Neurosurgery;  Laterality: Left;  LEFT FRONTAL CRANIOTOMY TUMOR EXCISION w/Brain Lab  . IR IMAGING GUIDED PORT INSERTION  05/08/2019   Social History:  reports that she quit smoking about 4 months ago. Her smoking use included cigarettes. She has a 12.50 pack-year smoking history. She has never used smokeless tobacco. She reports current drug use. Drug: Marijuana. She reports that she does not drink alcohol.  No Known Allergies  Family History  Problem Relation Age of Onset  . CAD Father        CABG  . Stroke Father   . Lymphoma Brother   . Breast cancer Neg Hx   . Pancreatic cancer Neg Hx   . Prostate cancer Neg Hx   . Colon cancer Neg Hx       Prior to Admission medications   Medication Sig Start Date End Date Taking? Authorizing Provider  acetaminophen (TYLENOL) 325 MG tablet Take 2 tablets (650 mg total) by mouth every 6 (six) hours. 04/22/19  Yes Love, Ivan Anchors, PA-C  dabigatran (PRADAXA) 150 MG CAPS capsule Take 1 capsule (150 mg total) by mouth 2 (two) times daily. 08/08/19  Yes Volanda Napoleon, MD  dronabinol (MARINOL) 5 MG capsule Take 1 capsule (5 mg total) by mouth 2 (two) times daily before lunch and supper. 09/02/19  Yes Ennever, Rudell Cobb, MD  feeding supplement, ENSURE ENLIVE, (ENSURE ENLIVE) LIQD Take 237 mLs by mouth 2 (two)  times daily between meals. 04/11/19  Yes Viona [name] D, NP  fentaNYL (DURAGESIC) 100 MCG/HR Place 2 patches onto the skin every other day.   Yes [provider]  folic acid (FOLVITE) 1 MG tablet Take 1 tablet (1 mg total) by mouth daily. Start 5-7 days before Alimta chemotherapy. Continue until 21 days after Alimta completed. 07/17/19  Yes Volanda Napoleon, MD  gabapentin (NEURONTIN) 300 MG capsule Take 1 capsule (300 mg total) by mouth 3 (three) times daily. Patient taking differently: Take 300 mg by mouth 3 (three) times daily as needed.  06/19/19  Yes Ennever, Rudell Cobb, MD  lactulose (CHRONULAC) 10 GM/15ML solution Take 30 mLs (20 g total) by mouth 2 (two) times daily. 05/06/19  Yes Volanda Napoleon, MD  lidocaine-prilocaine (EMLA) cream Apply to affected area once 07/03/19  Yes Ennever, Rudell Cobb, MD  lip balm (CARMEX) ointment Apply topically as needed for lip care. 05/17/19  Yes Swayze, Ava, DO  loratadine (CLARITIN) 10 MG tablet Take 1 tablet (10 mg total) by mouth daily. Patient taking differently: Take 10 mg by  mouth daily as needed for allergies.  05/17/19  Yes Swayze, Ava, DO  LORazepam (ATIVAN) 1 MG tablet Take 1 tablet (1 mg total) by mouth every 6 (six) hours as needed for anxiety. 09/25/19  Yes Ennever, Rudell Cobb, MD  methocarbamol (ROBAXIN) 500 MG tablet Take 1 tablet (500 mg total) by mouth every 6 (six) hours as needed for muscle spasms. 05/21/19  Yes Volanda Napoleon, MD  morphine (MSIR) 15 MG tablet Take 1 tablet (15 mg total) by mouth every 4 (four) hours as needed for severe pain. Patient taking differently: Take 30 mg by mouth every 4 (four) hours as needed for severe pain.  10/03/19  Yes Volanda Napoleon, MD  Multiple Vitamin (MULTIVITAMIN WITH MINERALS) TABS tablet Take 1 tablet by mouth daily. 05/17/19  Yes Swayze, Ava, DO  OLANZapine (ZYPREXA) 10 MG tablet Take 1 tablet (10 mg total) by mouth at bedtime. 09/20/19  Yes [place], Holli Humbles, NP  ondansetron (ZOFRAN ODT) 8 MG  disintegrating tablet Take 1 tablet (8 mg total) by mouth every 8 (eight) hours as needed for nausea or vomiting. 07/17/19  Yes Ennever, Rudell Cobb, MD  oxymetazoline (AFRIN) 0.05 % nasal spray Place 1 spray into both nostrils 2 (two) times daily as needed for congestion.   Yes [provider]  pantoprazole (PROTONIX) 40 MG tablet TAKE 1 TABLET (40 MG TOTAL) BY MOUTH DAILY WITH SUPPER. 08/28/19  Yes Ennever, Rudell Cobb, MD  polyvinyl alcohol (LIQUIFILM TEARS) 1.4 % ophthalmic solution Place 1 drop into both eyes as needed for dry eyes.   Yes [provider]  promethazine (PHENERGAN) 12.5 MG tablet TAKE 1-2 TABLETS BY MOUTH EVERY 4 (FOUR) HOURS AS NEEDED FOR REFRACTORY NAUSEA / VOMITING. Patient taking differently: Take 12.5-25 mg by mouth every 4 (four) hours as needed for refractory nausea / vomiting.  06/06/19  Yes Ennever, Rudell Cobb, MD  senna-docusate (SENOKOT-S) 8.6-50 MG tablet Take 1 tablet by mouth 2 (two) times daily. 08/15/19  Yes Volanda Napoleon, MD  dexamethasone (DECADRON) 2 MG tablet Take decadron 2 mg once daily until 10/10/2019 then half tablet (65m) daily for seven days and STOP. Patient taking differently: Take 0.5 mg by mouth 7 days.  10/04/19   MTyler Pita MD  NARCAN 4 MG/0.1ML LIQD nasal spray kit Place 1 spray into the nose once as needed (overdose).  Patient not taking: Reported on 10/09/2019 04/23/19   [provider]   Physical Exam: Vitals:   10/17/19 1056 10/17/19 1057 10/17/19 1058 10/17/19 1059  BP:      Pulse: 80 76 72 74  Resp: '19 12 13 13  ' Temp:      TempSrc:      SpO2: 94% 94% 93% 95%  Weight:      Height:        Wt Readings from Last 3 Encounters:  10/17/19 52.2 kg  10/09/19 52.2 kg  09/19/19 47.6 kg    Constitutional frail female, responds to voice/pain Eyes: eyes closed Cardiovascular: RRR no MRGs, with no peripheral edema Respiratory: 2 L, no accessory muscle use, increased rhonchi in upper airways Abdomen: Soft,non-tender,    Skin: No rash ulcers, or lesions. Without skin tenting  Neurologic:  Opens eyes to voice, able to speak  in response to pain (while being turned in bed stated" don't let me fall"),  Psychiatric:Appropriate affect, and mood. Mental status AAOx3          Labs on Admission:  Basic Metabolic Panel: Recent Labs  Lab  10/17/19 0519  NA 133*  K 3.7  CL 100  CO2 23  GLUCOSE 99  BUN 10  CREATININE 0.34*  CALCIUM 8.4*   Liver Function Tests: Recent Labs  Lab 10/17/19 0519  AST 13*  ALT 13  ALKPHOS 100  BILITOT 0.7  PROT 6.1*  ALBUMIN 3.0*   No results for input(s): LIPASE, AMYLASE in the last 168 hours. No results for input(s): AMMONIA in the last 168 hours. CBC: Recent Labs  Lab 10/17/19 0519  WBC 10.5  NEUTROABS 8.7*  HGB 10.5*  HCT 32.5*  MCV 96.2  PLT 296   Cardiac Enzymes: No results for input(s): CKTOTAL, CKMB, CKMBINDEX, TROPONINI in the last 168 hours.  BNP (last 3 results) Recent Labs    10/17/19 0529  BNP 53.0    ProBNP (last 3 results) No results for input(s): PROBNP in the last 8760 hours.  CBG: No results for input(s): GLUCAP in the last 168 hours.  Radiological Exams on Admission: CT Head Wo Contrast  Result Date: 10/17/2019 CLINICAL DATA:  Altered mental status (AMS), unclear cause. Additional history provided: Low oxygen saturation, right lung cancer with bone and brain metastases, craniotomy for tumor resection 04/05/2019. EXAM: CT HEAD WITHOUT CONTRAST TECHNIQUE: Contiguous axial images were obtained from the base of the skull through the vertex without intravenous contrast. COMPARISON:  Brain MRI 08/14/2019, head CT 05/14/2019 FINDINGS: Brain: Again demonstrated is extensive multifocal vasogenic edema within the bilateral cerebral hemispheres in this patient with multiple known cerebral and cerebellar metastases. The metastases themselves were better appreciated on prior MRI 08/14/2018. As compared to this prior exam, vasogenic edema within the  posterior cerebral hemispheres and left temporal lobe have significantly increased. Redemonstrated patchy edema within the cerebellum. There is asymmetric mass effect. Unchanged 2 mm leftward midline shift measured at the level of the septum pellucidum. Redemonstrated sequela of prior left parietal craniotomy with resection cavity in the underlying left frontoparietal lobes. There is no acute intracranial hemorrhage. No extra-axial fluid collection. Unchanged background chronic small vessel ischemic disease with chronic lacunar infarcts in the basal ganglia and thalami. Vascular: No hyperdense vessel.  Atherosclerotic calcifications. Skull: Prior left parietal craniotomy. No calvarial fracture or suspicious osseous lesion. Sinuses/Orbits: Visualized orbits show no acute finding. No significant paranasal sinus disease or mastoid effusion at the imaged levels. IMPRESSION: Again demonstrated is extensive multifocal vasogenic edema within the cerebral hemispheres in this patient with multiple known cerebral and cerebellar metastases. The metastases themselves were better appreciated on prior MRI 08/14/2018. As compared to this prior exam, vasogenic edema within the posterior cerebral hemispheres and left temporal lobe have significantly increased. Contrast-enhanced brain MRI may be obtained to better assess for progression of intracranial metastatic disease, as clinically warranted. Redemonstrated patchy edema within the cerebellum. Asymmetric mass effect with unchanged 2 mm leftward midline shift. Redemonstrated sequela of prior left frontoparietal tumor resection. No acute intracranial hemorrhage or herniation. Electronically Signed   By: Kellie Simmering DO   On: 10/17/2019 07:41   CT Angio Chest PE W and/or Wo Contrast  Result Date: 10/17/2019 CLINICAL DATA:  Shortness of breath. EXAM: CT ANGIOGRAPHY CHEST WITH CONTRAST TECHNIQUE: Multidetector CT imaging of the chest was performed using the standard protocol during  bolus administration of intravenous contrast. Multiplanar CT image reconstructions and MIPs were obtained to evaluate the vascular anatomy. CONTRAST:  72m OMNIPAQUE IOHEXOL 350 MG/ML SOLN COMPARISON:  August 06, 2019. FINDINGS: Cardiovascular: Large filling defect is seen in lower lobe branch of left pulmonary artery consistent with  pulmonary embolus. Smaller embolus is noted in lower lobe branch of right pulmonary artery. RV/LV ratio of approximately 1.0 is noted suggesting possible right heart strain. Normal cardiac size. No pericardial effusion. Atherosclerosis of thoracic aorta is noted without aneurysm or dissection. Mediastinum/Nodes: 1.9 cm subcarinal lymph node is noted. Grossly stable precarinal mass is noted measuring 2.7 cm. Thyroid gland is unremarkable. The esophagus appears normal. Lungs/Pleura: No pneumothorax is noted. Small bilateral pleural effusions are noted with adjacent subsegmental atelectasis. Left lower lobe large airspace opacity is noted which may represent atelectasis, pneumonia or infarction. 2.8 cm left upper lobe mass is noted in perihilar region which is enlarged compared to prior exam. 2 cm rounded nodule is noted in the superior segment of right lower lobe which is enlarged compared to prior exam. 1 cm nodule is noted in right lower lobe which is new since prior exam. 5 mm subpleural nodule is noted anteriorly in the left upper lobe. Upper Abdomen: No acute abnormality. Musculoskeletal: Mildly displaced fracture is seen involving the lateral portion of a left lower rib. Continued sclerotic densities are noted in the thoracic spine consistent with metastatic disease. Review of the MIP images confirms the above findings. IMPRESSION: 1. Large filling defect is seen in lower lobe branch of left pulmonary artery consistent with pulmonary embolus. Smaller embolus is noted in lower lobe branch of right pulmonary artery. RV/LV ratio of approximately 1.0 is noted suggesting possible right  heart strain. Critical Value/emergent results were called by telephone at the time of interpretation on 10/17/2019 at 8:01 am to provider [facility] , who verbally acknowledged these results. 2. Large left lower lobe opacity is noted posteriorly which may represent atelectasis, pneumonia or possibly pulmonary infarction. 3. Small bilateral pleural effusions are noted with adjacent subsegmental atelectasis. 4. Left upper lobe mass is noted in perihilar region which is enlarged compared to prior exam consistent with metastatic disease. 5. 2 cm rounded nodule is noted in the superior segment of the right lower lobe which is enlarged compared to prior exam consistent with metastatic disease. 6. 1 cm nodule is noted in the right lower lobe which is new since prior exam consistent with metastatic disease. 7. Continued sclerotic densities are noted in the thoracic spine consistent with metastatic disease. 8. Mildly displaced fracture is seen involving the lateral portion of a left lower rib. Aortic Atherosclerosis (ICD10-I70.0). Electronically Signed   By: Marijo Conception M.D.   On: 10/17/2019 08:02   DG Chest Port 1 View  Result Date: 10/17/2019 CLINICAL DATA:  Altered mental status EXAM: PORTABLE CHEST 1 VIEW COMPARISON:  CT 08/06/2019, radiograph 05/13/2019 FINDINGS: Accessed right IJ Port-A-Cath tip terminates near the cavoatrial junction. Cardiac monitoring leads and support devices overlie the chest. Redemonstration of the bilateral pulmonary masses, several of which are poorly visualized on this radiograph and certainly better seen on comparison CT imaging. There is increasing hazy interstitial opacity throughout the lungs with indistinct vascularity and developing left and possible trace right pleural effusions. No pneumothorax. Stable cardiomediastinal contours with a calcified aorta. No acute osseous or soft tissue abnormality. Degenerative changes are present in the imaged spine and shoulders. IMPRESSION:  Increasing hazy interstitial opacity throughout the lungs with indistinct vascularity as well as developing small left and possible trace right pleural effusions. Findings are concerning for pulmonary edema. Redemonstration of the multiple pulmonary nodules and masses, better seen on comparison CT. Aortic Atherosclerosis (ICD10-I70.0). Electronically Signed   By: Lovena Le M.D.   On: 10/17/2019 06:28  EKG: Independently reviewed. normal EKG, normal sinus rhythm.  Assessment/Plan  Active Problems:   Brain mass   Metastatic cancer to brain [facility])   Chronic deep vein thrombosis (DVT) of right peroneal vein ([facility])   Pain from bone metastases ([facility])   Acute pulmonary embolism ([facility])   Bone metastases ([facility])   Right leg weakness   Right arm weakness   Cerebral edema ([facility])   Delirium due to another medical condition, acute, hypoactive   Altered mental state   Acute respiratory failure with hypoxia ([facility])  Worsening cerebral vasogenic edema with midline shift in setting of known cerebral metastasis.  Suspect this may be a subacute reaction to radiation therapy from April after discussing with radiation oncology.  New cerebral/cerebellar metastasis vasogenic edema noted on MRI brain from 08/15/2019.  Vasogenic edema has significantly worsened based off CT head imaging in EDwith unchanged 2 mm midline shift/mass-effect but no hemorrhage or herniation and fairly symptomatic. Patient was already on decadron taper at home -Continue IV decadron 20 mg twice daily, monitor response, if improved can de-escalate to 4 mg 4 times daily as outpatient per radiation oncology recs -If mental status does not improve in 24 hours, radiation oncology recommends obtaining MRI brain with contrast in hospital (was only able to get MRI brain without contrast on admission due to noncooperation from poor mental status) -ED provider discussed with neurosurgery who states no surgical intervention -Husband would like to trial 24  hours of medications to see if any improvement in mental status and if not consideration of hospice care/comfort care --palliative care consulted  Worsening hypoactive delirium Suspect related to worsening cerebral edema from above. Amount of edema could increase risk for seizures as well. Still responds to voice and pain but this is acute change from prior baseline of alert and oriented x 4.  Does not follow commands, at baseline she has right sided weakness. Doubt related to opioid regimen. No evidence of hemorrhage or herniation on CT head -MRI Brain STAT --EEG --s/p Keppra load -neuro checks -holding all home oral meds until mental status improves -Continue to closely monitor, judicious use of sedative/opioids  Acute PE, with possible right heart strain. In setting of malignancy. Previously on pradaxa for DVT.  BMP, troponin unremarkable.  On 2 L continuous to maintain SPO2 greater than.  No acute respiratory distress -Oncology agrees with starting heparin with no bolus dosing -pharmacy consulted to assist with heparin dosing -TTE to assess right heart strain  Acute hypoxic respiratory failure, multifactorial etiology ( acute on chronic PE, worsening pulmonary masses, left lower lobe opacity unclear if atelectasis pna or infarct). Suspect LLL opacity likely infarct from PE. Doubt pna given no increase in WBC or fever. Currently on 2 L with normal respiratory effort. -s/p vancomycin and zosyn in ED. Will hold on further antibiotics unless becomes febrile -follow blood cultures -Have started heparin with no bolus dosing anticoagulation for acute PE -Continue submental O2, maintain SPO2 goal greater than 92% -Incentive spirometer, once mental status improved to assist with atelectasis  Metastatic adenocarcinoma of the lung, with cerebral and spine metastasis, unclear if worsening Completed brain radiation therapy 07/2019 Followed by Dr. Marin Olp as outpatient who was considering either  continuing chemotherapy or pursuing hospice on her last outpatient visit on 10/17/19. Only able to get MRI Brain wo contrast here. Possible subacute reaction to radiation  -oncology  And radiation oncology consulted --will need MRI brain w contrast ( as outpatient in 4-6 weeks, or as inpatient if no improvement  on steroids in 24 hours)  Chronic pain from known metastatic cancer She is on morphine 30 mg q6 hours and duragesic patch150 mcg every 3 days which was increased to every 2 days on last visit to oncology -currently holding oral pain regimen given altered mental status  Chronic Occlusive DVT of right peroneal vein (03/2019), and nonocclusive thrombus in the right femoral vein.  On Pradaxa as outpatient -Continue IV heparin given acute PE    DVT: IV heparin (no bolus dosing)  AM Labs Ordered, also please review Full Orders  Family Communication: Admission, patients condition and plan of care including tests being ordered have been discussed with the patient's husband who indicate understanding and agree with the plan and Code Status.  Code Status DNR--discussed and confirmed with husband at bedside  Likely DC to  Be determined  Condition GUARDED    Consults called: Oncology. (Neurosurgery by ED provider), radiation oncology  Admission status: admit to progressive unit     Desiree Hane M.D on 10/17/2019 at 11:54 AM  To page go to [email or web address] - password [email or web address]   If 7PM-7AM, please contact night-coverage [email or web address] Password [facility]  10/17/2019, 11:54 AM

## 2019-10-17 NOTE — ED Notes (Signed)
Patient's daughter in the room. Patient more alert than previously. Patient requesting food. Oral care attempted. Patient states, "I do not want something wet. I want food."  Patient informed that writer would call the admitting physician.

## 2019-10-17 NOTE — ED Triage Notes (Addendum)
Per EMS, Pt, from home, presents w/ AMS.  Hx of lung CA w/ brain and bone mets.  Husband reported to EMS that Pt is typically A & Ox4 and ambulatory.  Currently, Pt nonverbal and will not follow commands.  Full code.

## 2019-10-17 NOTE — Progress Notes (Signed)
Pharmacy Brief Note  Patient's a 64 y.o F with metastatic lung cancer to the bone and brain, presented to the ED on 6/24 with c/o AMS and was found to be hypoxic in the ED.  Chest CTA showed acute bilateral PE with suspected right heart strain.  Pharmacy is consulted to start heparin for acute PE.  O: HL 0.16  A: HL is below goal. No report of infusion being paused or issues with line per RN.   P: Increase heparin infusion to 1000 units/hr and recheck a level with AM labs.   Ulice Dash, PharmD, BCPS

## 2019-10-17 NOTE — ED Notes (Addendum)
Dr. Lonny Prude paged regarding [employer].

## 2019-10-18 ENCOUNTER — Other Ambulatory Visit: Payer: Self-pay | Admitting: Hematology & Oncology

## 2019-10-18 ENCOUNTER — Inpatient Hospital Stay (HOSPITAL_COMMUNITY): Payer: BC Managed Care – PPO

## 2019-10-18 ENCOUNTER — Other Ambulatory Visit: Payer: Self-pay | Admitting: *Deleted

## 2019-10-18 ENCOUNTER — Ambulatory Visit (HOSPITAL_COMMUNITY): Admission: RE | Admit: 2019-10-18 | Payer: BC Managed Care – PPO | Source: Ambulatory Visit

## 2019-10-18 DIAGNOSIS — C3491 Malignant neoplasm of unspecified part of right bronchus or lung: Secondary | ICD-10-CM

## 2019-10-18 DIAGNOSIS — C3490 Malignant neoplasm of unspecified part of unspecified bronchus or lung: Secondary | ICD-10-CM

## 2019-10-18 DIAGNOSIS — C7931 Secondary malignant neoplasm of brain: Secondary | ICD-10-CM

## 2019-10-18 DIAGNOSIS — I2699 Other pulmonary embolism without acute cor pulmonale: Secondary | ICD-10-CM

## 2019-10-18 DIAGNOSIS — Z7189 Other specified counseling: Secondary | ICD-10-CM

## 2019-10-18 DIAGNOSIS — C7951 Secondary malignant neoplasm of bone: Secondary | ICD-10-CM

## 2019-10-18 DIAGNOSIS — M25551 Pain in right hip: Secondary | ICD-10-CM

## 2019-10-18 DIAGNOSIS — R52 Pain, unspecified: Secondary | ICD-10-CM

## 2019-10-18 DIAGNOSIS — R531 Weakness: Secondary | ICD-10-CM

## 2019-10-18 LAB — URINE CULTURE: Culture: NO GROWTH

## 2019-10-18 LAB — COMPREHENSIVE METABOLIC PANEL
ALT: 14 U/L (ref 0–44)
AST: 13 U/L — ABNORMAL LOW (ref 15–41)
Albumin: 2.7 g/dL — ABNORMAL LOW (ref 3.5–5.0)
Alkaline Phosphatase: 92 U/L (ref 38–126)
Anion gap: 8 (ref 5–15)
BUN: 12 mg/dL (ref 8–23)
CO2: 26 mmol/L (ref 22–32)
Calcium: 8.6 mg/dL — ABNORMAL LOW (ref 8.9–10.3)
Chloride: 103 mmol/L (ref 98–111)
Creatinine, Ser: 0.41 mg/dL — ABNORMAL LOW (ref 0.44–1.00)
GFR calc Af Amer: >60 mL/min (ref >60)
GFR calc non Af Amer: >60 mL/min (ref >60)
Glucose, Bld: 122 mg/dL — ABNORMAL HIGH (ref 70–99)
Potassium: 4 mmol/L (ref 3.5–5.1)
Sodium: 137 mmol/L (ref 135–145)
Total Bilirubin: 0.7 mg/dL (ref 0.3–1.2)
Total Protein: 6.1 g/dL — ABNORMAL LOW (ref 6.5–8.1)

## 2019-10-18 LAB — CBC
HCT: 34.6 % — ABNORMAL LOW (ref 36.0–46.0)
Hemoglobin: 11.5 g/dL — ABNORMAL LOW (ref 12.0–15.0)
MCH: 31.1 pg (ref 26.0–34.0)
MCHC: 33.2 g/dL (ref 30.0–36.0)
MCV: 93.5 fL (ref 80.0–100.0)
Platelets: 381 10*3/uL (ref 150–400)
RBC: 3.7 MIL/uL — ABNORMAL LOW (ref 3.87–5.11)
RDW: 14.2 % (ref 11.5–15.5)
WBC: 10.6 10*3/uL — ABNORMAL HIGH (ref 4.0–10.5)
nRBC: 0 % (ref 0.0–0.2)

## 2019-10-18 LAB — HEPARIN LEVEL (UNFRACTIONATED)
Heparin Unfractionated: 0.41 IU/mL (ref 0.30–0.70)
Heparin Unfractionated: 0.43 IU/mL (ref 0.30–0.70)

## 2019-10-18 MED ORDER — FLUCONAZOLE 100 MG PO TABS: 100.0000 mg | ORAL_TABLET | Freq: Every day | ORAL | 0 refills | Status: AC

## 2019-10-18 MED ORDER — HYDROMORPHONE HCL 2 MG PO TABS
4.0000 mg | ORAL_TABLET | ORAL | Status: DC | PRN
  Administered 2019-10-18: 4 mg via ORAL
  Filled 2019-10-18: qty 2

## 2019-10-18 MED ORDER — PANTOPRAZOLE SODIUM 40 MG PO TBEC
40.0000 mg | DELAYED_RELEASE_TABLET | Freq: Two times a day (BID) | ORAL | Status: DC
  Administered 2019-10-18: 40 mg via ORAL
  Filled 2019-10-18: qty 1

## 2019-10-18 MED ORDER — DEXAMETHASONE 4 MG PO TABS
8.0000 mg | ORAL_TABLET | Freq: Three times a day (TID) | ORAL | 1 refills | Status: AC
Start: 2019-10-18 — End: 2019-11-17

## 2019-10-18 MED ORDER — LEVETIRACETAM 500 MG PO TABS
500.0000 mg | ORAL_TABLET | Freq: Two times a day (BID) | ORAL | 3 refills | Status: AC
Start: 2019-10-18 — End: ?

## 2019-10-18 MED ORDER — LEVETIRACETAM 500 MG PO TABS
500.0000 mg | ORAL_TABLET | Freq: Two times a day (BID) | ORAL | Status: DC
  Administered 2019-10-18: 500 mg via ORAL
  Filled 2019-10-18: qty 1

## 2019-10-18 MED ORDER — FENTANYL 100 MCG/HR TD PT72: 2.0000 | MEDICATED_PATCH | TRANSDERMAL | 0 refills | Status: AC

## 2019-10-18 MED ORDER — HEPARIN SOD (PORK) LOCK FLUSH 100 UNIT/ML IV SOLN
500.0000 [IU] | INTRAVENOUS | Status: AC | PRN
  Administered 2019-10-18: 500 [IU]
  Filled 2019-10-18: qty 5

## 2019-10-18 MED ORDER — FLUCONAZOLE 100 MG PO TABS
100.0000 mg | ORAL_TABLET | Freq: Every day | ORAL | Status: DC
  Administered 2019-10-18: 100 mg via ORAL
  Filled 2019-10-18: qty 1

## 2019-10-18 MED ORDER — HYDROMORPHONE HCL 2 MG PO TABS: 4.0000 mg | ORAL_TABLET | ORAL | Status: DC | PRN

## 2019-10-18 MED ORDER — SODIUM CHLORIDE 0.9% FLUSH
10.0000 mL | INTRAVENOUS | Status: DC | PRN
  Administered 2019-10-18 (×2): 10 mL

## 2019-10-18 MED ORDER — DEXAMETHASONE 4 MG PO TABS
4.0000 mg | ORAL_TABLET | Freq: Four times a day (QID) | ORAL | 0 refills | Status: DC
Start: 2019-10-18 — End: 2019-10-18

## 2019-10-18 MED ORDER — DEXAMETHASONE 4 MG PO TABS
8.0000 mg | ORAL_TABLET | Freq: Three times a day (TID) | ORAL | Status: DC
  Administered 2019-10-18: 8 mg via ORAL
  Filled 2019-10-18: qty 2

## 2019-10-18 MED ORDER — HYDROMORPHONE HCL 4 MG PO TABS: 4.0000 mg | ORAL_TABLET | ORAL | 0 refills | Status: AC | PRN

## 2019-10-18 MED ORDER — FENTANYL 75 MCG/HR TD PT72: 2.0000 | MEDICATED_PATCH | TRANSDERMAL | 0 refills | Status: DC

## 2019-10-18 NOTE — Plan of Care (Signed)
Discharge instructions reviewed with patient and spouse, questions answered, verbalized understanding.  Patient transported to main entrance via wheelchair to be taken home by spouse.

## 2019-10-18 NOTE — Progress Notes (Signed)
Bilateral lower extremity venous duplex has been completed. Preliminary results can be found in CV Proc through chart review.  Results were given to the patient's nurse, Heather.  10/18/19 9:12 AM [name] RVT

## 2019-10-18 NOTE — Discharge Summary (Signed)
Physician Discharge Summary  [name] GHW:[id number] DOB: [birth year] DOA: 10/17/2019  PCP: [name], MD  Admit date: 10/17/2019 Discharge date: 10/18/2019  Admitted From: Home Disposition: Home with hospice   Recommendations for Outpatient Follow-up:  1. Hospice  Home Health: Hospice Equipment/Devices: Hospice Discharge Condition: Stable for transfer home CODE STATUS: DNR Diet recommendation: As tolerated  Brief/Interim Summary: [name] is a 64 y.o. female with a history of metastatic NSCLC with right-sided weakness due to brain metastases s/p resection of symptomatic brain met and subsequent radiation therapy who tolerated chemotherapy poorly, on immunotherapy. Also has history of DVT, PE on pradaxa and was brought to the ED with decreased responsiveness found to have advancing metastasis-related cerebral edema for which high dose steroids were started with some improvement. No seizure on EEG. She had a CT angiogram of the chest which showed another pulmonary embolism.  She also had some progression of the lung primary. Recurrent DVT was confirmed on U/S as well and IVC filter placement was considered and/or transition to alternative anticoagulation, though the patient and family wish to return home with hospice as soon as possible. This desire was shared with the medical team, her oncologist, and palliative care consultant. We discharged her with decadron to follow up with home hospice per their wishes, in stable condition.  Discharge Diagnoses:  Active Problems:   Brain mass   Metastatic cancer to brain [facility])   Chronic deep vein thrombosis (DVT) of right peroneal vein (HCC)   Pain from bone metastases (HCC)   Acute pulmonary embolism (HCC)   Bone metastases (HCC)   Right leg weakness   Right arm weakness   Cerebral edema ([place])   Delirium due to another medical condition, acute, hypoactive   Altered mental state   Acute respiratory failure with hypoxia ([place])   DNR (do  not resuscitate)  Discharge Instructions Discharge Instructions    Discharge instructions   Complete by: As directed    You were evaluated for altered mental status which is due to increasing brain swelling and has been treated with steroids. You will continue decadron (steroid) 4 times per day (sent to your pharmacy. This puts you at risk of thrush, so diflucan once daily has also been sent to your pharmacy and taken to prevent thrush.   You also have new blood clots, so will need to continue the blood thinner. As we discussed, an IVC filter may prevent blood clots from the legs going to the lungs, though it may also cause a clogged filter resulting in severe leg swelling. The filter will not improve your quality of life and would delay your discharge. After our discussions, you will be discharged with plans to follow up with home hospice.     Allergies as of 10/18/2019   No Known Allergies     Medication List    TAKE these medications   acetaminophen 325 MG tablet Commonly known as: TYLENOL Take 2 tablets (650 mg total) by mouth every 6 (six) hours.   dabigatran 150 MG Caps capsule Commonly known as: Pradaxa Take 1 capsule (150 mg total) by mouth 2 (two) times daily.   dexamethasone 4 MG tablet Commonly known as: DECADRON Take 1 tablet (4 mg total) by mouth 4 (four) times daily. What changed:   medication strength  how much to take  how to take this  when to take this  additional instructions   dronabinol 5 MG capsule Commonly known as: MARINOL Take 1 capsule (5 mg total) by mouth  2 (two) times daily before lunch and supper.   feeding supplement (ENSURE ENLIVE) Liqd Take 237 mLs by mouth 2 (two) times daily between meals.   fentaNYL 100 MCG/HR Commonly known as: [place] 2 patches onto the skin every other day.   fluconazole 100 MG tablet Commonly known as: DIFLUCAN Take 1 tablet (100 mg total) by mouth daily. Start taking on: October 19, 8278   folic  acid 1 MG tablet Commonly known as: FOLVITE Take 1 tablet (1 mg total) by mouth daily. Start 5-7 days before Alimta chemotherapy. Continue until 21 days after Alimta completed.   gabapentin 300 MG capsule Commonly known as: NEURONTIN Take 1 capsule (300 mg total) by mouth 3 (three) times daily. What changed:   when to take this  reasons to take this   lactulose 10 GM/15ML solution Commonly known as: CHRONULAC Take 30 mLs (20 g total) by mouth 2 (two) times daily.   lidocaine-prilocaine cream Commonly known as: EMLA Apply to affected area once   lip balm ointment Apply topically as needed for lip care.   loratadine 10 MG tablet Commonly known as: CLARITIN Take 1 tablet (10 mg total) by mouth daily. What changed:   when to take this  reasons to take this   LORazepam 1 MG tablet Commonly known as: ATIVAN Take 1 tablet (1 mg total) by mouth every 6 (six) hours as needed for anxiety.   methocarbamol 500 MG tablet Commonly known as: ROBAXIN Take 1 tablet (500 mg total) by mouth every 6 (six) hours as needed for muscle spasms.   morphine 15 MG tablet Commonly known as: MSIR Take 1 tablet (15 mg total) by mouth every 4 (four) hours as needed for severe pain. What changed: how much to take   multivitamin with minerals Tabs tablet Take 1 tablet by mouth daily.   Narcan 4 MG/0.1ML Liqd nasal spray kit Generic drug: naloxone Place 1 spray into the nose once as needed (overdose).   OLANZapine 10 MG tablet Commonly known as: ZYPREXA Take 1 tablet (10 mg total) by mouth at bedtime.   ondansetron 8 MG disintegrating tablet Commonly known as: Zofran ODT Take 1 tablet (8 mg total) by mouth every 8 (eight) hours as needed for nausea or vomiting.   oxymetazoline 0.05 % nasal spray Commonly known as: AFRIN Place 1 spray into both nostrils 2 (two) times daily as needed for congestion.   pantoprazole 40 MG tablet Commonly known as: PROTONIX TAKE 1 TABLET (40 MG TOTAL) BY  MOUTH DAILY WITH SUPPER.   polyvinyl alcohol 1.4 % ophthalmic solution Commonly known as: LIQUIFILM TEARS Place 1 drop into both eyes as needed for dry eyes.   promethazine 12.5 MG tablet Commonly known as: PHENERGAN TAKE 1-2 TABLETS BY MOUTH EVERY 4 (FOUR) HOURS AS NEEDED FOR REFRACTORY NAUSEA / VOMITING. What changed:   how much to take  how to take this  when to take this  reasons to take this  additional instructions   senna-docusate 8.6-50 MG tablet Commonly known as: Senokot-S Take 1 tablet by mouth 2 (two) times daily.       Follow-up Information    Volanda Napoleon, MD Follow up.   Specialty: Oncology Contact information: [address] [place] [address] [phone]        [facility] Follow up.   Why: Will follow you at home for Hospice. Please call the above number for any questions or concerns.  Contact information: [address]  [place] [phone]             No Known Allergies  Consultations:  Oncology, Dr. Marin Olp  IR, Dr. Pascal Lux by phone  Palliative care, Dr. Rowe Pavy  Procedures/Studies: EEG  Result Date: 10/17/2019 Lora Havens, MD     10/17/2019  1:53 PM Patient Name: [name] MRN: [id number] Epilepsy Attending: Lora Havens Referring Physician/Provider: Dr Oretha Milch Date: 10/17/2019 Duration: 24.32 mins Patient history: 64yo F altered mental status, worsening cerebral vasogenic edema with midline shift in setting of known cerebral metastasis. EEG to evaluate for seizure. Level of alertness: Lethargic AEDs during EEG study: None Technical aspects: This EEG study was done with scalp electrodes positioned according to the 10-20 International system of electrode placement. Electrical activity was acquired at a sampling rate of '500Hz'  and reviewed with a high frequency filter of '70Hz'  and a low frequency filter of '1Hz' . EEG data were recorded continuously and digitally  stored. Description: No posterior dominant rhythm was seen. EEG showed continuous generalized polymorphic 5-'9hz'  theta-alpha activity. Hyperventilation and photic stimulation were not performed.   ABNORMALITY -Continuous slow, generalized IMPRESSION: This study is suggestive of mild to moderate diffuse encephalopathy, nonspecific etiology. No seizures or epileptiform discharges were seen throughout the recording. Lora Havens   CT Head Wo Contrast  Result Date: 10/17/2019 CLINICAL DATA:  Altered mental status (AMS), unclear cause. Additional history provided: Low oxygen saturation, right lung cancer with bone and brain metastases, craniotomy for tumor resection 04/05/2019. EXAM: CT HEAD WITHOUT CONTRAST TECHNIQUE: Contiguous axial images were obtained from the base of the skull through the vertex without intravenous contrast. COMPARISON:  Brain MRI 08/14/2019, head CT 05/14/2019 FINDINGS: Brain: Again demonstrated is extensive multifocal vasogenic edema within the bilateral cerebral hemispheres in this patient with multiple known cerebral and cerebellar metastases. The metastases themselves were better appreciated on prior MRI 08/14/2018. As compared to this prior exam, vasogenic edema within the posterior cerebral hemispheres and left temporal lobe have significantly increased. Redemonstrated patchy edema within the cerebellum. There is asymmetric mass effect. Unchanged 2 mm leftward midline shift measured at the level of the septum pellucidum. Redemonstrated sequela of prior left parietal craniotomy with resection cavity in the underlying left frontoparietal lobes. There is no acute intracranial hemorrhage. No extra-axial fluid collection. Unchanged background chronic small vessel ischemic disease with chronic lacunar infarcts in the basal ganglia and thalami. Vascular: No hyperdense vessel.  Atherosclerotic calcifications. Skull: Prior left parietal craniotomy. No calvarial fracture or suspicious osseous  lesion. Sinuses/Orbits: Visualized orbits show no acute finding. No significant paranasal sinus disease or mastoid effusion at the imaged levels. IMPRESSION: Again demonstrated is extensive multifocal vasogenic edema within the cerebral hemispheres in this patient with multiple known cerebral and cerebellar metastases. The metastases themselves were better appreciated on prior MRI 08/14/2018. As compared to this prior exam, vasogenic edema within the posterior cerebral hemispheres and left temporal lobe have significantly increased. Contrast-enhanced brain MRI may be obtained to better assess for progression of intracranial metastatic disease, as clinically warranted. Redemonstrated patchy edema within the cerebellum. Asymmetric mass effect with unchanged 2 mm leftward midline shift. Redemonstrated sequela of prior left frontoparietal tumor resection. No acute intracranial hemorrhage or herniation. Electronically Signed   By: Kellie Simmering DO   On: 10/17/2019 07:41   CT Angio Chest PE W and/or Wo Contrast  Result Date: 10/17/2019 CLINICAL DATA:  Shortness of breath. EXAM: CT ANGIOGRAPHY CHEST WITH CONTRAST TECHNIQUE: Multidetector CT imaging of the chest was performed  using the standard protocol during bolus administration of intravenous contrast. Multiplanar CT image reconstructions and MIPs were obtained to evaluate the vascular anatomy. CONTRAST:  44m OMNIPAQUE IOHEXOL 350 MG/ML SOLN COMPARISON:  August 06, 2019. FINDINGS: Cardiovascular: Large filling defect is seen in lower lobe branch of left pulmonary artery consistent with pulmonary embolus. Smaller embolus is noted in lower lobe branch of right pulmonary artery. RV/LV ratio of approximately 1.0 is noted suggesting possible right heart strain. Normal cardiac size. No pericardial effusion. Atherosclerosis of thoracic aorta is noted without aneurysm or dissection. Mediastinum/Nodes: 1.9 cm subcarinal lymph node is noted. Grossly stable precarinal mass is  noted measuring 2.7 cm. Thyroid gland is unremarkable. The esophagus appears normal. Lungs/Pleura: No pneumothorax is noted. Small bilateral pleural effusions are noted with adjacent subsegmental atelectasis. Left lower lobe large airspace opacity is noted which may represent atelectasis, pneumonia or infarction. 2.8 cm left upper lobe mass is noted in perihilar region which is enlarged compared to prior exam. 2 cm rounded nodule is noted in the superior segment of right lower lobe which is enlarged compared to prior exam. 1 cm nodule is noted in right lower lobe which is new since prior exam. 5 mm subpleural nodule is noted anteriorly in the left upper lobe. Upper Abdomen: No acute abnormality. Musculoskeletal: Mildly displaced fracture is seen involving the lateral portion of a left lower rib. Continued sclerotic densities are noted in the thoracic spine consistent with metastatic disease. Review of the MIP images confirms the above findings. IMPRESSION: 1. Large filling defect is seen in lower lobe branch of left pulmonary artery consistent with pulmonary embolus. Smaller embolus is noted in lower lobe branch of right pulmonary artery. RV/LV ratio of approximately 1.0 is noted suggesting possible right heart strain. Critical Value/emergent results were called by telephone at the time of interpretation on 10/17/2019 at 8:01 am to provider [facility], who verbally acknowledged these results. 2. Large left lower lobe opacity is noted posteriorly which may represent atelectasis, pneumonia or possibly pulmonary infarction. 3. Small bilateral pleural effusions are noted with adjacent subsegmental atelectasis. 4. Left upper lobe mass is noted in perihilar region which is enlarged compared to prior exam consistent with metastatic disease. 5. 2 cm rounded nodule is noted in the superior segment of the right lower lobe which is enlarged compared to prior exam consistent with metastatic disease. 6. 1 cm nodule is noted in  the right lower lobe which is new since prior exam consistent with metastatic disease. 7. Continued sclerotic densities are noted in the thoracic spine consistent with metastatic disease. 8. Mildly displaced fracture is seen involving the lateral portion of a left lower rib. Aortic Atherosclerosis (ICD10-I70.0). Electronically Signed   By: JMarijo ConceptionM.D.   On: 10/17/2019 08:02   MR BRAIN WO CONTRAST  Result Date: 10/17/2019 CLINICAL DATA:  known cerebral mets, worsening vasogenic edema and midline shift with worsening confusion; Focal neuro deficit, > 6 hrs, stroke suspected. EXAM: MRI HEAD WITHOUT CONTRAST TECHNIQUE: Multiplanar, multiecho pulse sequences of the brain and surrounding structures were obtained without intravenous contrast. COMPARISON:  Head CT October 17, 2019; MRI of the brain August 14, 2019. FINDINGS: Patient unable to tolerate lying still in the scanner for the duration of the study. Study was terminated prematurely. Brain: No restricted diffusion to suggest an acute infarct. Prominent multifocal vasogenic edema with a 2 mm leftward midline shift not significantly changed from prior CT. Foci of susceptibility artifacts noted in the right frontal lobe, left  temporal lobe, left cerebellar hemisphere and vermis likely represent hemosiderin deposits within metastatic lesions. Compared to prior MRI, there is interval decrease of the vasogenic edema in the anterior frontal lobe bilaterally with increased vasogenic edema in the posterior right frontal lobe and new vasogenic edema in the right parietal and left temporal lobes. In the posterior fossa, there is interval decrease of the vasogenic edema within the right middle cerebellar peduncle/cerebellar hemisphere and left cerebellar hemisphere when compared to prior MRI. No contrast administration performed. Vascular: Normal flow voids. Skull and upper cervical spine: Postsurgical changes from left parietal craniotomy. Otherwise, normal marrow  signal. Sinuses/Orbits: Negative. Other: None. IMPRESSION: 1. Incomplete study. Patinet unable to tolerate lying still in the scanner for the duration of the study. 2. Prominent multifocal areas of vasogenic edema with a 2 mm leftward midline shift not significantly changed from prior CT performed earlier today. 3. Compared to MRI performed in April 2021, areas of increased vasogenic edema are seen in the posterior right frontal and parietal regions and left temporal lobe while a decreasing vasogenic is noted in the bilateral anterior frontal lobes. Electronically Signed   By: Pedro Earls M.D.   On: 10/17/2019 12:03   DG Chest Port 1 View  Result Date: 10/17/2019 CLINICAL DATA:  Altered mental status EXAM: PORTABLE CHEST 1 VIEW COMPARISON:  CT 08/06/2019, radiograph 05/13/2019 FINDINGS: Accessed right IJ Port-A-Cath tip terminates near the cavoatrial junction. Cardiac monitoring leads and support devices overlie the chest. Redemonstration of the bilateral pulmonary masses, several of which are poorly visualized on this radiograph and certainly better seen on comparison CT imaging. There is increasing hazy interstitial opacity throughout the lungs with indistinct vascularity and developing left and possible trace right pleural effusions. No pneumothorax. Stable cardiomediastinal contours with a calcified aorta. No acute osseous or soft tissue abnormality. Degenerative changes are present in the imaged spine and shoulders. IMPRESSION: Increasing hazy interstitial opacity throughout the lungs with indistinct vascularity as well as developing small left and possible trace right pleural effusions. Findings are concerning for pulmonary edema. Redemonstration of the multiple pulmonary nodules and masses, better seen on comparison CT. Aortic Atherosclerosis (ICD10-I70.0). Electronically Signed   By: Lovena Le M.D.   On: 10/17/2019 06:28   VAS [place] LOWER EXTREMITY VENOUS (DVT)  Result Date:  10/18/2019  Lower Venous DVTStudy Indications: Pulmonary embolism.  Limitations: Patient positioning. Comparison Study: 08/06/2019 - Chronic R FV, PeroV Performing Technologist: Oliver Hum RVT  Examination Guidelines: A complete evaluation includes B-mode imaging, spectral Doppler, color Doppler, and power Doppler as needed of all accessible portions of each vessel. Bilateral testing is considered an integral part of a complete examination. Limited examinations for reoccurring indications may be performed as noted. The reflux portion of the exam is performed with the patient in reverse Trendelenburg.  +---------+---------------+---------+-----------+----------+-----------------+ RIGHT    CompressibilityPhasicitySpontaneityPropertiesThrombus Aging    +---------+---------------+---------+-----------+----------+-----------------+ CFV      Partial        No       No                   Age Indeterminate +---------+---------------+---------+-----------+----------+-----------------+ SFJ      Full                                                           +---------+---------------+---------+-----------+----------+-----------------+  FV Prox  Full                                                           +---------+---------------+---------+-----------+----------+-----------------+ FV Mid   Full                                                           +---------+---------------+---------+-----------+----------+-----------------+ FV DistalFull                                                           +---------+---------------+---------+-----------+----------+-----------------+ PFV      None           No       No                   Acute             +---------+---------------+---------+-----------+----------+-----------------+ POP      Full           No       No                                      +---------+---------------+---------+-----------+----------+-----------------+ PTV      Full                                                           +---------+---------------+---------+-----------+----------+-----------------+ PERO     Full                                                           +---------+---------------+---------+-----------+----------+-----------------+ EIV      Full           Yes      Yes                                    +---------+---------------+---------+-----------+----------+-----------------+   +---------+---------------+---------+-----------+----------+--------------+ LEFT     CompressibilityPhasicitySpontaneityPropertiesThrombus Aging +---------+---------------+---------+-----------+----------+--------------+ CFV      Partial        Yes      Yes                  Acute          +---------+---------------+---------+-----------+----------+--------------+ SFJ      Full                                                        +---------+---------------+---------+-----------+----------+--------------+  FV Prox  None           No       No                   Acute          +---------+---------------+---------+-----------+----------+--------------+ FV Mid   None           No       No                   Acute          +---------+---------------+---------+-----------+----------+--------------+ FV DistalNone           No       No                   Acute          +---------+---------------+---------+-----------+----------+--------------+ PFV      Full                                                        +---------+---------------+---------+-----------+----------+--------------+ POP      Full           Yes      Yes                                 +---------+---------------+---------+-----------+----------+--------------+ PTV      None                                         Acute           +---------+---------------+---------+-----------+----------+--------------+ PERO     Full                                                        +---------+---------------+---------+-----------+----------+--------------+     Summary: RIGHT: - Findings consistent with acute deep vein thrombosis involving the right proximal profunda vein. - Findings consistent with age indeterminate deep vein thrombosis involving the right common femoral vein. - No cystic structure found in the popliteal fossa.  LEFT: - Findings consistent with acute deep vein thrombosis involving the left common femoral vein, left femoral vein, and left posterior tibial veins. - No cystic structure found in the popliteal fossa.  *See table(s) above for measurements and observations.    Preliminary    ECHOCARDIOGRAM LIMITED  Result Date: 10/17/2019    ECHOCARDIOGRAM LIMITED REPORT   Patient Name:   [name] Date of Exam: 10/17/2019 Medical Rec #:  [id number]     Height:       67.0 in Accession #:    [id number]    Weight:       115.0 lb Date of Birth:  1955-11-01      BSA:          1.598 m Patient Age:    63 years      BP:           101/65 mmHg Patient Gender: F  HR:           69 bpm. Exam Location:  Inpatient Procedure: Limited Echo, Cardiac Doppler and Color Doppler Indications:    I26.02 Pulmonary embolus  History:        Patient has prior history of Echocardiogram examinations, most                 recent 05/13/2019. Risk Factors:Hypertension. Lung Cancer.  Sonographer:    Tiffany Dance Referring Phys: [id number] [place]  1. Left ventricular ejection fraction, by estimation, is 60 to 65%. The left ventricle has normal function. The left ventricle has no regional wall motion abnormalities. Left ventricular diastolic parameters were normal.  2. Right ventricular systolic function is normal. The right ventricular size is normal. There is normal pulmonary artery systolic pressure. The estimated right ventricular  systolic pressure is 23.3 mmHg.  3. The mitral valve is normal in structure. No evidence of mitral valve regurgitation.  4. The aortic valve is tricuspid. Aortic valve regurgitation is not visualized. No aortic stenosis is present.  5. The inferior vena cava is normal in size with greater than 50% respiratory variability, suggesting right atrial pressure of 3 mmHg. FINDINGS  Left Ventricle: Left ventricular ejection fraction, by estimation, is 60 to 65%. The left ventricle has normal function. The left ventricle has no regional wall motion abnormalities. The left ventricular internal cavity size was normal in size. There is  no left ventricular hypertrophy. Right Ventricle: The right ventricular size is normal. Right vetricular wall thickness was not assessed. Right ventricular systolic function is normal. There is normal pulmonary artery systolic pressure. The tricuspid regurgitant velocity is 2.18 m/s, and with an assumed right atrial pressure of 3 mmHg, the estimated right ventricular systolic pressure is 43.5 mmHg. Left Atrium: Left atrial size was normal in size. Right Atrium: Right atrial size was normal in size. Pericardium: Trivial pericardial effusion is present. Mitral Valve: The mitral valve is normal in structure. Tricuspid Valve: The tricuspid valve is normal in structure. Tricuspid valve regurgitation is trivial. Aortic Valve: The aortic valve is tricuspid. Aortic valve regurgitation is not visualized. No aortic stenosis is present. Pulmonic Valve: The pulmonic valve was not well visualized. Pulmonic valve regurgitation is not visualized. Aorta: The aortic root and ascending aorta are structurally normal, with no evidence of dilitation. Venous: The inferior vena cava is normal in size with greater than 50% respiratory variability, suggesting right atrial pressure of 3 mmHg. IAS/Shunts: No atrial level shunt detected by color flow Doppler.  LEFT VENTRICLE PLAX 2D LVIDd:         4.20 cm  Diastology  LVIDs:         2.70 cm  LV e' lateral:   9.68 cm/s LV PW:         1.00 cm  LV E/e' lateral: 8.2 LV IVS:        0.90 cm  LV e' medial:    8.27 cm/s LVOT diam:     1.90 cm  LV E/e' medial:  9.6 LV SV:         79 LV SV Index:   49 LVOT Area:     2.84 cm  RIGHT VENTRICLE RV Basal diam:  2.80 cm RV S prime:     14.50 cm/s TAPSE (M-mode): 2.0 cm LEFT ATRIUM             Index       RIGHT ATRIUM  Index LA diam:        4.30 cm 2.69 cm/m  RA Area:     10.80 cm LA Vol (A2C):   30.4 ml 19.02 ml/m RA Volume:   19.70 ml  12.32 ml/m LA Vol (A4C):   18.6 ml 11.64 ml/m LA Biplane Vol: 23.3 ml 14.58 ml/m  AORTIC VALVE LVOT Vmax:   138.00 cm/s LVOT Vmean:  89.300 cm/s LVOT VTI:    0.279 m  AORTA Ao Root diam: 3.00 cm Ao Asc diam:  2.70 cm MITRAL VALVE               TRICUSPID VALVE MV Area (PHT): 3.37 cm    TR Peak grad:   19.0 mmHg MV Decel Time: 225 msec    TR Vmax:        218.00 cm/s MV E velocity: 79.80 cm/s MV A velocity: 87.30 cm/s  SHUNTS MV E/A ratio:  0.91        Systemic VTI:  0.28 m                            Systemic Diam: 1.90 cm Oswaldo Milian MD Electronically signed by Oswaldo Milian MD Signature Date/Time: 10/17/2019/6:21:05 PM    Final      Subjective: More alert, still having trouble processing new information. Husband and daughter at bedside confirm all she's wanted is to go home. No chest pain. They don't want to wait for IVC filter placement and fear that she'd be in more pain from occluded IVC filter than from recurrent PE.  Discharge Exam: Vitals:   10/18/19 0241 10/18/19 0630  BP: 113/78 119/73  Pulse: 69 72  Resp: 10   Temp: 97.7 F (36.5 C) 97.7 F (36.5 C)  SpO2: 93% 97%   General: Pt is alert, awake, not in acute distress Cardiovascular: RRR, S1/S2 +, no rubs, no gallops Respiratory: CTA bilaterally, no wheezing, no rhonchi Abdominal: Soft, NT, ND, bowel sounds + Extremities: No pitting edema, no cyanosis  Labs: BNP (last 3 results) Recent Labs     10/17/19 0529  BNP 90.3   Basic Metabolic Panel: Recent Labs  Lab 10/17/19 0519 10/18/19 0409  NA 133* 137  K 3.7 4.0  CL 100 103  CO2 23 26  GLUCOSE 99 122*  BUN 10 12  CREATININE 0.34* 0.41*  CALCIUM 8.4* 8.6*   Liver Function Tests: Recent Labs  Lab 10/17/19 0519 10/18/19 0409  AST 13* 13*  ALT 13 14  ALKPHOS 100 92  BILITOT 0.7 0.7  PROT 6.1* 6.1*  ALBUMIN 3.0* 2.7*   No results for input(s): LIPASE, AMYLASE in the last 168 hours. No results for input(s): AMMONIA in the last 168 hours. CBC: Recent Labs  Lab 10/17/19 0519 10/18/19 0409  WBC 10.5 10.6*  NEUTROABS 8.7*  --   HGB 10.5* 11.5*  HCT 32.5* 34.6*  MCV 96.2 93.5  PLT 296 381   Cardiac Enzymes: No results for input(s): CKTOTAL, CKMB, CKMBINDEX, TROPONINI in the last 168 hours. BNP: Invalid input(s): POCBNP CBG: No results for input(s): GLUCAP in the last 168 hours. D-Dimer No results for input(s): DDIMER in the last 72 hours. Hgb A1c No results for input(s): HGBA1C in the last 72 hours. Lipid Profile No results for input(s): CHOL, HDL, LDLCALC, TRIG, CHOLHDL, LDLDIRECT in the last 72 hours. Thyroid function studies No results for input(s): TSH, T4TOTAL, T3FREE, THYROIDAB in the last 72 hours.  Invalid input(s): FREET3 Anemia  work up No results for input(s): VITAMINB12, FOLATE, FERRITIN, TIBC, IRON, RETICCTPCT in the last 72 hours. Urinalysis    Component Value Date/Time   COLORURINE YELLOW 10/17/2019 0542   APPEARANCEUR CLEAR 10/17/2019 0542   LABSPEC 1.015 10/17/2019 0542   PHURINE 6.0 10/17/2019 0542   GLUCOSEU NEGATIVE 10/17/2019 0542   HGBUR NEGATIVE 10/17/2019 0542   BILIRUBINUR NEGATIVE 10/17/2019 0542   KETONESUR NEGATIVE 10/17/2019 0542   PROTEINUR NEGATIVE 10/17/2019 0542   NITRITE NEGATIVE 10/17/2019 0542   LEUKOCYTESUR NEGATIVE 10/17/2019 0542    Microbiology Recent Results (from the past 240 hour(s))  SARS Coronavirus 2 by RT PCR (hospital order, performed in [facility] hospital lab) Nasopharyngeal Nasopharyngeal Swab     Status: None   Collection Time: 10/17/19  5:25 AM   Specimen: Nasopharyngeal Swab  Result Value Ref Range Status   SARS Coronavirus 2 NEGATIVE NEGATIVE Final    Comment: (NOTE) SARS-CoV-2 target nucleic acids are NOT DETECTED.  The SARS-CoV-2 RNA is generally detectable in upper and lower respiratory specimens during the acute phase of infection. The lowest concentration of SARS-CoV-2 viral copies this assay can detect is 250 copies / mL. A negative result does not preclude SARS-CoV-2 infection and should not be used as the sole basis for treatment or other patient management decisions.  A negative result may occur with improper specimen collection / handling, submission of specimen other than nasopharyngeal swab, presence of viral mutation(s) within the areas targeted by this assay, and inadequate number of viral copies (<250 copies / mL). A negative result must be combined with clinical observations, patient history, and epidemiological information.  Fact Sheet for Patients:   StrictlyIdeas.no  Fact Sheet for Healthcare Providers: BankingDealers.co.za  This test is not yet approved or  cleared by the [place] FDA and has been authorized for detection and/or diagnosis of SARS-CoV-2 by FDA under an Emergency Use Authorization (EUA).  This EUA will remain in effect (meaning this test can be used) for the duration of the COVID-19 declaration under Section 564(b)(1) of the Act, 21 U.S.C. section 360bbb-3(b)(1), unless the authorization is terminated or revoked sooner.  Performed at [facility], [place] [address], [place], [place] [address]   Urine culture     Status: None   Collection Time: 10/17/19  5:42 AM   Specimen: In/Out Cath Urine  Result Value Ref Range Status   Specimen Description   Final    IN/OUT CATH URINE Performed at [place] [address], [place], [place] [address]    Special Requests   Final    NONE Performed at [facility], [place] [address] [place], [place] [address]    Culture   Final    NO GROWTH Performed at [facility], [place] [address], [place], [place] [address]    Report Status 10/18/2019 FINAL  Final    Time coordinating discharge: Approximately 40 minutes  Patrecia Pour, MD  [facility] 10/18/2019, 11:18 AM

## 2019-10-18 NOTE — Consult Note (Signed)
Referral MD  Reason for Referral: Progressive NSCLC; Recurrent PE  Chief Complaint  Patient presents with  . CA Pt  . Altered Mental Status  : Patient became lethargic at home.  HPI: Ms. Kampf is well-known to me.  She is a 64 year old white female.  She has metastatic non-small cell lung cancer.  She presented back in December 2020 with weakness on the right side.  She had brain metastasis.  She had the primary in the left lung.  She did undergo resection of the symptomatic brain met.  She did have radiation therapy afterwards.  She has been on immunotherapy for the adenocarcinoma that she has.  We tried chemotherapy which she tolerated very poorly.  She became less responsive at home yesterday.  She was having more problems with pain.  She has bony metastasis.  She is on Pradaxa for pulmonary embolism and DVT.  She was subsequently brought to emergency room.  Unfortunately, she was found to have extensive cerebral edema from progressive brain metastasis.  She had a CT angiogram of the chest which showed another pulmonary embolism.  She also had some progression of the lung primary.  Her pain is under better control right now.  It is apparent that she is losing weight.  Is hard to say when she is really eating.  She had a EEG done which did not show any obvious seizure activity.  Her mental status improved on Decadron.  It is clear that her malignancy is progressing.  She is not a candidate for any chemotherapy.  She has had past radiation therapy to the brain.  We talked about end-of-life issues.  We talked about quality of life and comfort.  She just wants to go home.  I totally agree with this.  We talked about Hospice.  This will definitely help with her and her husband.  They are agreeable to Hospice.  We need to make sure that she has adequate pain control.  She is on a fentanyl patch.  We will switch her morphine over to Dilaudid.  As far as the pulmonary embolism is  concerned, I would want to try to avoid injectable blood thinners.  She is already been on oral anticoagulants.  I really do not want to get her on Coumadin as this would be a really inconvenience.  I think that we should get Dopplers of her legs.  If there is a new blood clot there, then I will see about getting an IVC filter placed.  She really wants to go home today.  She has a dog at home that she wants to be with.  I totally agree with the DO NOT RESUSCITATE status.  I suspect that her prognosis is comparatively less than a month or so.  She has declined significantly since I last saw her a week or so ago.  Her husband is doing a great job with her.  He is incredibly dedicated to make sure that she has comfort.  I really do not think she needs to be on a cardiac monitor right now.  We will try to switch her medications over to oral.  She clearly will need the Decadron.  She will also need Diflucan to try to help prevent thrush.  She is on heparin right now.  I would just keep her on the heparin until we know the results of the Doppler.  Overall, I would say her performance status is no better than ECOG 3.   Past Medical History:  Diagnosis Date  . Bronchitis   . Goals of care, counseling/discussion 04/24/2019  . Hypertension   . Lung cancer ([facility])    with painful bone mets  . Osteoarthritis of metacarpophalangeal (MCP) joint of left thumb   . Pelvic ring fracture ([place]) 09/2016   left  :  Past Surgical History:  Procedure Laterality Date  . APPLICATION OF CRANIAL NAVIGATION Left 04/05/2019   Procedure: APPLICATION OF CRANIAL NAVIGATION;  Surgeon: Earnie Larsson, MD;  Location: [facility];  Service: Neurosurgery;  Laterality: Left;  . CRANIOTOMY Left 04/05/2019   Procedure: LEFT FRONTAL CRANIOTOMY TUMOR EXCISION w/Brain Lab;  Surgeon: Earnie Larsson, MD;  Location: [facility];  Service: Neurosurgery;  Laterality: Left;  LEFT FRONTAL CRANIOTOMY TUMOR EXCISION w/Brain Lab  . IR IMAGING  GUIDED PORT INSERTION  05/08/2019  :   Current Facility-Administered Medications:  .  Chlorhexidine Gluconate Cloth 2 % PADS 6 each, 6 each, Topical, Daily, Nettey, Myrlene Broker D, MD .  fentaNYL (DURAGESIC) 75 MCG/HR 1 patch, 1 patch, Transdermal, Q48H, 1 patch at 10/17/19 1224 **AND** fentaNYL (DURAGESIC) 75 MCG/HR 1 patch, 1 patch, Transdermal, Q48H, Shawnmichael Parenteau, Rudell Cobb, MD, 1 patch at 10/17/19 1223 .  heparin ADULT infusion 100 units/mL (25000 units/262m sodium chloride 0.45%), 1,000 Units/hr, Intravenous, Continuous, Nettey, SMyrlene BrokerD, MD, Last Rate: 10 mL/hr at 10/17/19 2156, 1,000 Units/hr at 10/17/19 2156 .  HYDROmorphone (DILAUDID) tablet 4 mg, 4 mg, Oral, Q3H PRN, EVolanda Napoleon MD .  levETIRAcetam (KEPPRA) tablet 500 mg, 500 mg, Oral, BID, Manasvi Dickard, PRudell Cobb MD .  ondansetron (ZOFRAN) tablet 4 mg, 4 mg, Oral, Q6H PRN **OR** ondansetron (ZOFRAN) injection 4 mg, 4 mg, Intravenous, Q6H PRN, NOretha MilchD, MD:  . Chlorhexidine Gluconate Cloth  6 each Topical Daily  . fentaNYL  1 patch Transdermal Q48H   And  . fentaNYL  1 patch Transdermal Q48H  . levETIRAcetam  500 mg Oral BID  :  No Known Allergies:  Family History  Problem Relation Age of Onset  . CAD Father        CABG  . Stroke Father   . Lymphoma Brother   . Breast cancer Neg Hx   . Pancreatic cancer Neg Hx   . Prostate cancer Neg Hx   . Colon cancer Neg Hx   :  Social History   Socioeconomic History  . Marital status: Married    Spouse name: ERandall Hiss . Number of children: Not on file  . Years of education: Not on file  . Highest education level: Not on file  Occupational History  . Not on file  Tobacco Use  . Smoking status: Former Smoker    Packs/day: 0.50    Years: 25.00    Pack years: 12.50    Types: Cigarettes    Quit date: 06/11/2019    Years since quitting: 0.3  . Smokeless tobacco: Never Used  Vaping Use  . Vaping Use: Never used  Substance and Sexual Activity  . Alcohol use: No    Alcohol/week: 0.0  standard drinks  . Drug use: Yes    Types: Marijuana    Comment: when needed  . Sexual activity: Not Currently    Birth control/protection: Post-menopausal  Other Topics Concern  . Not on file  Social History Narrative  . Not on file   Social Determinants of Health   Financial Resource Strain:   . Difficulty of Paying Living Expenses:   Food Insecurity:   . Worried About RCharity fundraiserin  the Last Year:   . [place] in the Last Year:   Transportation Needs:   . Film/video editor (Medical):   Marland Kitchen Lack of Transportation (Non-Medical):   Physical Activity:   . Days of Exercise per Week:   . Minutes of Exercise per Session:   Stress:   . Feeling of Stress :   Social Connections:   . Frequency of Communication with Friends and Family:   . Frequency of Social Gatherings with Friends and Family:   . Attends Religious Services:   . Active Member of Clubs or Organizations:   . Attends Archivist Meetings:   Marland Kitchen Marital Status:   Intimate Partner Violence:   . Fear of Current or Ex-Partner:   . Emotionally Abused:   Marland Kitchen Physically Abused:   . Sexually Abused:   :  Review of Systems  Constitutional: Positive for malaise/fatigue and weight loss.  HENT: Negative.   Eyes: Negative.   Respiratory: Negative.   Cardiovascular: Negative.   Gastrointestinal: Negative.   Genitourinary: Negative.   Musculoskeletal: Positive for back pain and myalgias.  Skin: Negative.   Neurological: Positive for weakness and headaches.  Endo/Heme/Allergies: Negative.   Psychiatric/Behavioral: Negative.      Exam:  See above Patient Vitals for the past 24 hrs:  BP Temp Temp src Pulse Resp SpO2  10/18/19 0630 119/73 97.7 F (36.5 C) Oral 72 -- 97 %  10/18/19 0241 113/78 97.7 F (36.5 C) Oral 69 10 93 %  10/17/19 2237 114/67 (!) 97.5 F (36.4 C) Oral 69 -- 95 %  10/17/19 1824 98/66 98 F (36.7 C) Oral 71 16 95 %  10/17/19 1739 -- -- -- 71 12 95 %  10/17/19 1738 -- --  -- 71 11 94 %  10/17/19 1729 -- -- -- 76 17 94 %  10/17/19 1728 105/76 -- -- 72 16 96 %  10/17/19 1719 -- -- -- 81 (!) 21 95 %  10/17/19 1718 101/68 -- -- 81 (!) 21 97 %  10/17/19 1709 -- -- -- 76 13 94 %  10/17/19 1708 -- -- -- 74 11 94 %  10/17/19 1703 (!) 86/65 -- -- 77 16 93 %  10/17/19 1659 (!) 86/65 -- -- 79 19 93 %  10/17/19 1649 -- -- -- 78 14 94 %  10/17/19 1648 -- -- -- 79 12 93 %  10/17/19 1639 -- -- -- 77 (!) 21 95 %  10/17/19 1638 -- -- -- 75 18 95 %  10/17/19 1629 -- -- -- 77 20 93 %  10/17/19 1628 101/68 -- -- 79 (!) 21 95 %  10/17/19 1619 -- -- -- 74 13 94 %  10/17/19 1618 -- -- -- 73 13 94 %  10/17/19 1609 -- -- -- 72 10 95 %  10/17/19 1608 -- -- -- 70 (!) 9 93 %  10/17/19 1559 -- -- -- 79 11 94 %  10/17/19 1558 113/69 -- -- 80 (!) 21 95 %  10/17/19 1549 -- -- -- 85 14 94 %  10/17/19 1548 -- -- -- 77 15 94 %  10/17/19 1547 -- -- -- 78 18 93 %  10/17/19 1546 -- -- -- 80 18 94 %  10/17/19 1544 -- -- -- 80 (!) 21 93 %  10/17/19 1543 -- -- -- 83 15 93 %  10/17/19 1542 -- -- -- 83 19 93 %  10/17/19 1541 -- -- -- 84 14 92 %  10/17/19 1540 -- -- --  80 14 93 %  10/17/19 1539 -- -- -- 77 18 92 %  10/17/19 1538 -- -- -- 79 (!) 21 95 %  10/17/19 1537 -- -- -- 77 12 95 %  10/17/19 1536 -- -- -- 74 17 95 %  10/17/19 1535 -- -- -- 71 10 94 %  10/17/19 1534 -- -- -- 71 10 94 %  10/17/19 1533 -- -- -- 71 10 93 %  10/17/19 1532 -- -- -- 75 15 94 %  10/17/19 1531 -- -- -- 75 16 93 %  10/17/19 1530 100/67 -- -- 71 10 93 %  10/17/19 1529 -- -- -- 73 10 93 %  10/17/19 1528 -- -- -- 71 10 93 %  10/17/19 1527 -- -- -- 71 11 93 %  10/17/19 1526 -- -- -- 73 16 95 %  10/17/19 1525 -- -- -- 71 (!) 9 94 %  10/17/19 1524 -- -- -- 75 11 95 %  10/17/19 1523 -- -- -- 70 (!) 9 95 %  10/17/19 1522 -- -- -- 71 12 95 %  10/17/19 1521 -- -- -- 73 14 94 %  10/17/19 1520 -- -- -- 72 12 95 %  10/17/19 1519 -- -- -- 71 12 94 %  10/17/19 1518 -- -- -- 73 12 94 %  10/17/19 1517 -- -- -- 74  12 94 %  10/17/19 1516 -- -- -- 72 16 94 %  10/17/19 1515 111/70 -- -- 73 11 94 %  10/17/19 1514 -- -- -- 72 13 94 %  10/17/19 1513 -- -- -- 76 13 94 %  10/17/19 1512 -- -- -- 74 18 96 %  10/17/19 1511 -- -- -- 70 11 96 %  10/17/19 1510 -- -- -- 73 11 94 %  10/17/19 1509 -- -- -- 78 10 94 %  10/17/19 1508 -- -- -- 71 11 95 %  10/17/19 1507 -- -- -- 72 13 96 %  10/17/19 1506 -- -- -- 70 (!) 9 94 %  10/17/19 1505 -- -- -- 71 (!) 9 93 %  10/17/19 1504 -- -- -- 74 10 96 %  10/17/19 1503 -- -- -- 74 11 94 %  10/17/19 1502 -- -- -- 79 14 95 %  10/17/19 1501 -- -- -- 71 10 95 %  10/17/19 1500 101/70 -- -- 70 10 94 %  10/17/19 1459 -- -- -- 73 (!) 9 93 %  10/17/19 1458 -- -- -- 73 11 93 %  10/17/19 1457 -- -- -- 76 13 93 %  10/17/19 1456 -- -- -- 76 14 95 %  10/17/19 1455 -- -- -- 77 13 95 %  10/17/19 1454 -- -- -- 70 14 95 %  10/17/19 1453 -- -- -- 70 14 96 %  10/17/19 1452 -- -- -- 73 15 95 %  10/17/19 1451 -- -- -- 71 14 95 %  10/17/19 1450 -- -- -- 69 12 95 %  10/17/19 1449 -- -- -- 72 15 94 %  10/17/19 1448 -- -- -- 73 12 94 %  10/17/19 1447 -- -- -- 72 12 93 %  10/17/19 1446 -- -- -- 72 18 92 %  10/17/19 1445 109/74 -- -- 76 (!) 22 93 %  10/17/19 1444 -- -- -- 80 16 92 %  10/17/19 1443 -- -- -- 80 15 92 %  10/17/19 1442 -- -- -- 71 11 94 %  10/17/19 1441 -- -- -- 71 15   95 %  10/17/19 1440 -- -- -- 69 13 94 %  10/17/19 1439 -- -- -- 72 11 94 %  10/17/19 1438 -- -- -- 71 11 94 %  10/17/19 1437 -- -- -- 74 (!) 9 93 %  10/17/19 1436 -- -- -- 75 10 93 %  10/17/19 1435 -- -- -- 76 13 92 %  10/17/19 1434 -- -- -- 88 (!) 22 94 %  10/17/19 1433 -- -- -- 78 12 94 %  10/17/19 1432 -- -- -- 80 13 95 %  10/17/19 1431 -- -- -- 74 10 94 %  10/17/19 1430 -- -- -- 80 (!) 23 96 %  10/17/19 1429 102/72 -- -- 74 (!) 22 95 %  10/17/19 1428 -- -- -- 72 (!) 21 95 %  10/17/19 1427 -- -- -- 72 (!) 21 95 %  10/17/19 1426 -- -- -- 70 20 95 %  10/17/19 1425 -- -- -- 72 19 94 %  10/17/19 1424  -- -- -- 71 19 93 %  10/17/19 1423 -- -- -- 74 14 93 %  10/17/19 1422 -- -- -- 71 18 93 %  10/17/19 1421 -- -- -- 73 20 94 %  10/17/19 1420 -- -- -- 71 14 94 %  10/17/19 1419 -- -- -- 71 11 94 %  10/17/19 1418 -- -- -- 72 12 94 %  10/17/19 1417 -- -- -- 71 10 94 %  10/17/19 1416 -- -- -- 71 10 94 %  10/17/19 1415 100/68 -- -- 72 11 94 %  10/17/19 1414 -- -- -- 72 10 94 %  10/17/19 1413 -- -- -- 72 13 94 %  10/17/19 1412 -- -- -- 74 20 95 %  10/17/19 1411 -- -- -- 73 12 94 %  10/17/19 1410 -- -- -- 73 (!) 9 94 %  10/17/19 1409 -- -- -- 81 15 93 %  10/17/19 1408 -- -- -- 76 15 94 %  10/17/19 1407 -- -- -- 71 10 93 %  10/17/19 1406 -- -- -- 74 17 93 %  10/17/19 1405 -- -- -- 80 (!) 28 95 %  10/17/19 1404 -- -- -- 72 13 94 %  10/17/19 1403 -- -- -- 71 19 94 %  10/17/19 1402 -- -- -- 71 14 94 %  10/17/19 1401 -- -- -- 73 12 94 %  10/17/19 1400 99/66 -- -- 70 16 94 %  10/17/19 1359 -- -- -- 70 17 93 %  10/17/19 1358 -- -- -- 73 14 93 %  10/17/19 1357 -- -- -- 75 12 94 %  10/17/19 1356 -- -- -- 73 10 93 %  10/17/19 1355 -- -- -- 74 12 94 %  10/17/19 1354 -- -- -- 73 11 93 %  10/17/19 1353 -- -- -- 75 15 94 %  10/17/19 1352 -- -- -- 74 17 94 %  10/17/19 1351 -- -- -- 73 19 97 %  10/17/19 1350 -- -- -- 74 11 95 %  10/17/19 1349 -- -- -- 73 12 95 %  10/17/19 1348 -- -- -- 70 11 95 %  10/17/19 1347 -- -- -- 73 11 95 %  10/17/19 1346 -- -- -- 73 12 94 %  10/17/19 1345 96/63 -- -- 72 10 94 %  10/17/19 1344 -- -- -- 70 10 95 %  10/17/19 1343 -- -- -- 70 10 94 %  10/17/19 1342 -- -- -- 72 (!) 9   93 %  10/17/19 1341 -- -- -- 72 10 92 %  10/17/19 1340 -- -- -- 81 13 97 %  10/17/19 1339 -- -- -- 74 17 94 %  10/17/19 1338 -- -- -- 78 17 94 %  10/17/19 1337 -- -- -- 71 11 95 %  10/17/19 1336 -- -- -- 74 13 95 %  10/17/19 1335 -- -- -- 73 15 95 %  10/17/19 1334 -- -- -- 70 11 95 %  10/17/19 1333 -- -- -- 71 10 94 %  10/17/19 1332 -- -- -- 72 (!) 9 94 %  10/17/19 1331 -- -- -- 72 10 94  %  10/17/19 1330 101/64 -- -- 74 10 93 %  10/17/19 1329 -- -- -- 73 (!) 9 93 %  10/17/19 1328 -- -- -- 75 14 94 %  10/17/19 1327 -- -- -- 76 16 93 %  10/17/19 1326 -- -- -- 73 12 94 %  10/17/19 1325 -- -- -- 76 12 97 %  10/17/19 1324 -- -- -- 75 18 95 %  10/17/19 1323 -- -- -- 70 14 96 %  10/17/19 1322 -- -- -- 73 11 95 %  10/17/19 1321 -- -- -- 75 11 94 %  10/17/19 1320 -- -- -- 72 13 94 %  10/17/19 1319 -- -- -- 71 15 93 %  10/17/19 1318 -- -- -- 74 16 97 %  10/17/19 1317 -- -- -- 76 13 94 %  10/17/19 1316 -- -- -- 69 11 95 %  10/17/19 1315 (!) 90/58 -- -- 71 14 94 %  10/17/19 1314 -- -- -- 67 19 95 %  10/17/19 1313 -- -- -- 71 11 95 %  10/17/19 1312 -- -- -- 71 12 95 %  10/17/19 1311 -- -- -- 71 10 95 %  10/17/19 1310 -- -- -- 70 12 96 %  10/17/19 1309 -- -- -- 70 14 95 %  10/17/19 1308 -- -- -- 68 13 96 %  10/17/19 1307 -- -- -- 69 14 95 %  10/17/19 1306 -- -- -- 72 14 95 %  10/17/19 1305 -- -- -- 72 16 94 %  10/17/19 1304 -- -- -- 74 19 95 %  10/17/19 1303 -- -- -- 71 10 93 %  10/17/19 1302 -- -- -- 70 13 93 %  10/17/19 1301 -- -- -- 73 12 94 %  10/17/19 1300 (!) 88/63 -- -- 73 12 94 %  10/17/19 1259 -- -- -- 72 11 94 %  10/17/19 1258 -- -- -- 73 10 93 %  10/17/19 1257 -- -- -- 76 14 94 %  10/17/19 1256 -- -- -- 74 18 94 %  10/17/19 1255 -- -- -- 86 16 96 %  10/17/19 1254 -- -- -- 81 16 95 %  10/17/19 1253 -- -- -- 79 15 96 %  10/17/19 1252 -- -- -- 76 12 95 %  10/17/19 1251 -- -- -- 81 16 96 %  10/17/19 1250 -- -- -- 81 11 96 %  10/17/19 1249 -- -- -- 81 11 96 %  10/17/19 1248 -- -- -- 82 (!) 9 96 %  10/17/19 1247 -- -- -- 77 13 96 %  10/17/19 1246 -- -- -- 80 13 95 %  10/17/19 1245 101/65 -- -- 81 12 96 %  10/17/19 1244 -- -- -- 83 14 96 %  10/17/19 1243 -- -- -- 82 13 96 %  10/17/19   1242 -- -- -- 80 14 96 %  10/17/19 1241 -- -- -- 80 13 96 %  10/17/19 1240 -- -- -- 80 12 97 %  10/17/19 1239 -- -- -- 73 12 96 %  10/17/19 1238 -- -- -- 73 11 96 %  10/17/19  1237 -- -- -- 80 12 96 %  10/17/19 1236 -- -- -- 77 13 96 %  10/17/19 1235 -- -- -- 74 14 96 %  10/17/19 1234 -- -- -- 79 13 96 %  10/17/19 1233 -- -- -- 80 15 96 %  10/17/19 1232 -- -- -- 83 13 97 %  10/17/19 1231 -- -- -- 73 16 97 %  10/17/19 1230 114/79 -- -- 77 16 97 %  10/17/19 1229 -- -- -- 76 12 97 %  10/17/19 1228 -- -- -- 82 13 100 %  10/17/19 1227 -- -- -- 76 12 95 %  10/17/19 1226 -- -- -- 78 19 95 %  10/17/19 1225 -- -- -- 82 15 93 %  10/17/19 1224 -- -- -- 81 18 96 %  10/17/19 1223 -- -- -- 72 12 93 %  10/17/19 1222 -- -- -- 69 11 93 %  10/17/19 1221 -- -- -- 71 12 93 %  10/17/19 1220 -- -- -- 72 12 94 %  10/17/19 1219 -- -- -- 72 12 93 %  10/17/19 1218 -- -- -- 70 11 93 %  10/17/19 1217 -- -- -- 71 14 92 %  10/17/19 1216 -- -- -- 72 11 91 %  10/17/19 1215 104/66 -- -- 73 13 91 %  10/17/19 1214 -- -- -- 76 16 91 %  10/17/19 1213 -- -- -- 76 (!) 30 91 %  10/17/19 1212 -- -- -- 78 14 92 %  10/17/19 1210 -- -- -- 79 19 90 %  10/17/19 1209 -- -- -- 79 (!) 24 93 %  10/17/19 1208 -- -- -- 73 19 91 %  10/17/19 1207 -- -- -- 81 19 94 %  10/17/19 1206 -- -- -- 73 12 92 %  10/17/19 1205 -- -- -- 74 12 91 %  10/17/19 1204 -- -- -- 70 13 90 %  10/17/19 1203 -- -- -- 73 18 92 %  10/17/19 1202 107/72 -- -- 84 16 94 %  10/17/19 1201 -- -- -- 71 17 95 %  10/17/19 1200 -- -- -- 73 14 96 %  10/17/19 1159 -- -- -- 72 (!) 22 95 %  10/17/19 1158 -- -- -- 75 14 92 %  10/17/19 1157 -- -- -- 74 15 93 %  10/17/19 1156 -- -- -- 69 20 92 %  10/17/19 1155 -- -- -- 74 14 91 %  10/17/19 1154 -- -- -- 79 14 93 %  10/17/19 1153 -- -- -- 82 15 91 %  10/17/19 1152 -- -- -- 73 (!) 8 92 %  10/17/19 1151 -- -- -- 72 12 92 %  10/17/19 1150 -- -- -- 73 13 92 %  10/17/19 1149 -- -- -- 80 13 91 %  10/17/19 1148 -- -- -- 72 12 91 %  10/17/19 1146 102/69 -- -- 75 12 91 %  10/17/19 1102 95/64 -- -- 73 14 95 %  10/17/19 1059 -- -- -- 74 13 95 %  10/17/19 1058 -- -- -- 72 13 93 %  10/17/19 1057  -- -- -- 76 12 94 %  10/17/19 1056 -- -- --   80 19 94 %  10/17/19 1055 -- -- -- 90 (!) 25 94 %  10/17/19 1054 -- -- -- 86 (!) 21 96 %  10/17/19 1053 -- -- -- 80 (!) 24 96 %  10/17/19 1052 -- -- -- 78 18 96 %  10/17/19 1051 -- -- -- 79 14 97 %  10/17/19 1050 -- -- -- 80 12 96 %  10/17/19 1049 -- -- -- 81 14 96 %  10/17/19 1048 -- -- -- 80 13 96 %  10/17/19 1047 -- -- -- 86 15 97 %  10/17/19 1046 -- -- -- 85 16 97 %  10/17/19 1045 113/71 -- -- -- (!) 36 --  10/17/19 1044 -- -- -- 88 19 96 %  10/17/19 1043 -- -- -- 90 15 97 %  10/17/19 1042 -- -- -- 92 19 94 %  10/17/19 1041 -- -- -- 87 18 97 %  10/17/19 1040 -- -- -- 86 18 97 %  10/17/19 1039 -- -- -- 85 17 96 %  10/17/19 1038 -- -- -- 86 13 95 %  10/17/19 1037 -- -- -- 88 17 98 %  10/17/19 1036 -- -- -- 87 18 100 %  10/17/19 1035 -- -- -- 86 19 97 %  10/17/19 1034 -- -- -- 76 14 97 %  10/17/19 1033 -- -- -- 78 13 97 %  10/17/19 1032 -- -- -- 75 14 97 %  10/17/19 1031 -- -- -- 77 13 97 %  10/17/19 1030 -- -- -- 83 15 97 %  10/17/19 1029 -- -- -- 89 18 97 %  10/17/19 1028 91/63 -- -- (!) 101 (!) 21 97 %  10/17/19 1027 -- -- -- 86 (!) 23 94 %  10/17/19 1026 -- -- -- 86 20 96 %  10/17/19 1025 -- -- -- 75 17 97 %  10/17/19 1024 -- -- -- 77 (!) 26 98 %  10/17/19 1023 -- -- -- 78 (!) 26 98 %  10/17/19 1022 -- -- -- 77 (!) 26 98 %  10/17/19 1021 -- -- -- 77 (!) 24 98 %  10/17/19 1020 -- -- -- 78 16 98 %  10/17/19 1019 -- -- -- 75 18 98 %  10/17/19 1018 -- -- -- 77 18 98 %  10/17/19 1017 -- -- -- 76 16 98 %  10/17/19 1016 -- -- -- 76 (!) 21 98 %  10/17/19 1015 (!) 94/54 -- -- 75 (!) 22 98 %  10/17/19 1014 -- -- -- 78 14 98 %  10/17/19 1013 -- -- -- 77 15 98 %  10/17/19 1012 -- -- -- 77 14 98 %  10/17/19 1011 -- -- -- 76 (!) 22 99 %  10/17/19 1010 -- -- -- 79 (!) 22 98 %  10/17/19 1009 -- -- -- 81 20 98 %  10/17/19 1008 -- -- -- 96 20 96 %  10/17/19 1007 -- -- -- 83 18 94 %  10/17/19 1006 -- -- -- (!) 143 (!) 34 93 %  10/17/19  1005 -- -- -- 91 17 91 %  10/17/19 1004 -- -- -- 82 15 96 %  10/17/19 1003 -- -- -- 83 15 96 %  10/17/19 1002 -- -- -- 80 16 96 %  10/17/19 1001 -- -- -- 85 20 96 %  10/17/19 1000 (!) 86/57 -- -- 84 18 96 %  10/17/19 0959 -- -- -- 80 14 96 %  10/17/19 0958 -- -- -- 82   19 96 %  10/17/19 0957 -- -- -- 86 19 96 %  10/17/19 0956 -- -- -- 88 19 96 %  10/17/19 0955 -- -- -- 82 18 96 %  10/17/19 0954 -- -- -- 81 (!) 25 96 %  10/17/19 0953 -- -- -- 81 (!) 24 96 %  10/17/19 0952 -- -- -- 79 (!) 23 96 %  10/17/19 0951 -- -- -- 78 (!) 23 95 %  10/17/19 0950 -- -- -- 78 (!) 24 95 %  10/17/19 0949 -- -- -- 79 18 95 %  10/17/19 0948 -- -- -- 81 13 95 %  10/17/19 0947 -- -- -- 81 14 94 %  10/17/19 0946 -- -- -- 81 15 94 %  10/17/19 0945 -- -- -- 81 (!) 21 93 %  10/17/19 0944 (!) 93/55 -- -- 85 (!) 21 91 %  10/17/19 0943 (!) 83/59 -- -- 81 13 (!) 89 %  10/17/19 0942 -- -- -- 79 (!) 22 (!) 89 %  10/17/19 0941 -- -- -- 75 18 91 %  10/17/19 0940 -- -- -- 85 19 90 %  10/17/19 0939 -- -- -- 86 17 91 %  10/17/19 0938 -- -- -- 87 13 91 %  10/17/19 0937 -- -- -- 83 11 92 %  10/17/19 0936 -- -- -- 80 16 92 %  10/17/19 0935 -- -- -- 80 20 92 %  10/17/19 0934 -- -- -- 82 (!) 24 92 %  10/17/19 0933 -- -- -- 82 (!) 22 91 %  10/17/19 0932 -- -- -- 83 18 91 %  10/17/19 0931 -- -- -- 81 (!) 22 91 %  10/17/19 0930 (!) 80/49 -- -- 80 (!) 24 91 %  10/17/19 0929 -- -- -- 81 (!) 25 92 %  10/17/19 0928 -- -- -- 79 14 92 %  10/17/19 0927 -- -- -- 81 (!) 24 92 %  10/17/19 0926 -- -- -- 81 (!) 22 92 %  10/17/19 0925 -- -- -- 80 20 93 %  10/17/19 0924 -- -- -- 81 12 92 %  10/17/19 0923 -- -- -- 80 12 93 %  10/17/19 0922 -- -- -- 80 17 93 %  10/17/19 0921 -- -- -- 79 18 93 %  10/17/19 0920 -- -- -- 82 17 93 %  10/17/19 0919 -- -- -- 79 16 93 %  10/17/19 0918 -- -- -- 82 18 93 %  10/17/19 0917 -- -- -- 82 13 92 %  10/17/19 0916 -- -- -- 84 12 92 %  10/17/19 0915 (!) 81/50 -- -- 83 14 92 %  10/17/19 0914 -- --  -- 80 11 93 %  10/17/19 0913 -- -- -- 81 11 94 %  10/17/19 0912 -- -- -- 85 12 94 %  10/17/19 0911 -- -- -- 82 15 95 %  10/17/19 0910 -- -- -- 93 12 94 %  10/17/19 0909 -- -- -- 94 13 95 %  10/17/19 0908 -- -- -- 89 12 96 %  10/17/19 0907 -- -- -- 90 12 96 %  10/17/19 0906 -- -- -- 91 11 96 %  10/17/19 0905 -- -- -- 94 12 95 %  10/17/19 0904 -- -- -- 95 12 94 %  10/17/19 0903 -- -- -- 97 15 94 %  10/17/19 0902 90/60 -- -- 82 11 95 %  10/17/19 0901 -- -- -- 95 11 94 %  10/17/19 0900 -- -- --   94 13 94 %  10/17/19 0859 -- -- -- 87 11 92 %  10/17/19 0858 -- -- -- 84 12 95 %  10/17/19 0857 -- -- -- 93 12 93 %  10/17/19 0856 -- -- -- 81 12 93 %  10/17/19 0855 -- -- -- 90 12 93 %  10/17/19 0854 -- -- -- 81 12 94 %  10/17/19 0853 -- -- -- 80 11 92 %  10/17/19 0852 -- -- -- 80 13 94 %  10/17/19 0851 -- -- -- 84 12 96 %  10/17/19 0850 -- -- -- 84 14 95 %  10/17/19 0849 -- -- -- 88 13 97 %  10/17/19 0848 -- -- -- 82 14 96 %  10/17/19 0847 101/68 -- -- 79 14 96 %  10/17/19 0846 -- -- -- 78 17 96 %  10/17/19 0845 -- -- -- 78 13 96 %  10/17/19 0844 -- -- -- 80 13 94 %  10/17/19 0843 -- -- -- -- 17 --  10/17/19 0842 -- -- -- 85 14 96 %  10/17/19 0841 -- -- -- 83 15 97 %  10/17/19 0840 -- -- -- 82 14 99 %  10/17/19 0839 -- -- -- 80 14 97 %  10/17/19 0838 -- -- -- 78 14 96 %  10/17/19 0837 -- -- -- 80 18 96 %  10/17/19 0836 -- -- -- 84 (!) 21 96 %  10/17/19 0835 -- -- -- 86 (!) 23 96 %  10/17/19 0834 -- -- -- 93 15 97 %  10/17/19 0833 -- -- -- 100 19 98 %  10/17/19 0832 100/66 -- -- 88 (!) 25 97 %  10/17/19 0831 -- -- -- 98 20 96 %  10/17/19 0830 -- -- -- 80 (!) 21 98 %  10/17/19 0829 -- -- -- 84 (!) 24 96 %  10/17/19 0828 -- -- -- 77 14 97 %  10/17/19 0827 -- -- -- 80 20 98 %  10/17/19 0826 -- -- -- 79 13 94 %  10/17/19 0825 -- -- -- 79 14 95 %  10/17/19 0824 -- -- -- 82 18 97 %  10/17/19 0823 -- -- -- 84 19 94 %  10/17/19 0822 -- -- -- 83 19 92 %  10/17/19 0821 -- -- -- 82 13 92  %  10/17/19 0820 -- -- -- 81 13 93 %  10/17/19 0819 -- -- -- 82 14 95 %  10/17/19 0818 -- -- -- 82 17 96 %  10/17/19 0817 109/64 -- -- 80 18 96 %  10/17/19 0816 -- -- -- 80 15 97 %  10/17/19 0815 -- -- -- 83 (!) 22 97 %  10/17/19 0814 -- -- -- 85 (!) 23 98 %  10/17/19 0813 -- -- -- 85 (!) 27 97 %  10/17/19 0812 -- -- -- 85 15 99 %  10/17/19 0811 -- -- -- 80 16 96 %  10/17/19 0810 -- -- -- -- 15 --  10/17/19 0809 -- -- -- -- (!) 21 --  10/17/19 0808 -- -- -- 81 13 96 %  10/17/19 0807 -- -- -- 95 18 94 %  10/17/19 0806 -- -- -- 78 18 96 %  10/17/19 0805 -- -- -- 83 20 93 %  10/17/19 0804 -- -- -- 78 17 97 %  10/17/19 0803 -- -- -- 74 (!) 22 96 %  10/17/19 0802 (!) 123/59 -- -- 77 16 96 %  10/17/19 0801 -- -- -- 75   19 93 %  10/17/19 0800 -- -- -- -- (!) 28 --  10/17/19 0759 -- -- -- 79 (!) 27 97 %  10/17/19 0758 -- -- -- 79 17 96 %  10/17/19 0757 -- -- -- 86 18 96 %  10/17/19 0756 -- -- -- 85 13 97 %  10/17/19 0755 -- -- -- -- 19 --  10/17/19 0754 -- -- -- 79 13 95 %  10/17/19 0753 -- -- -- 87 14 98 %  10/17/19 0752 -- -- -- 82 14 96 %  10/17/19 0751 -- -- -- 78 12 97 %  10/17/19 0750 -- -- -- 78 12 98 %  10/17/19 0749 -- -- -- 79 11 96 %  10/17/19 0748 -- -- -- 74 13 95 %  10/17/19 0747 115/70 -- -- 79 12 97 %  10/17/19 0746 117/68 -- -- 78 14 98 %  10/17/19 0745 -- -- -- 82 13 96 %  10/17/19 0744 -- -- -- 77 11 96 %  10/17/19 0743 -- -- -- 79 (!) 25 98 %  10/17/19 0742 -- -- -- 77 11 97 %  10/17/19 0741 -- -- -- 78 12 96 %  10/17/19 0740 -- -- -- 77 11 98 %  10/17/19 0739 -- -- -- 87 14 95 %  10/17/19 0738 -- -- -- 84 11 96 %  10/17/19 0737 -- -- -- 78 12 98 %  10/17/19 0736 -- -- -- 78 15 97 %  10/17/19 0735 -- -- -- -- 12 --  10/17/19 0734 -- -- -- -- 20 --  10/17/19 0733 -- -- -- -- 12 --  10/17/19 0732 -- -- -- -- 18 --  10/17/19 0731 128/71 -- -- 83 14 98 %  10/17/19 0730 -- -- -- -- 15 --     Recent Labs    10/17/19 0519 10/18/19 0409  WBC 10.5 10.6*   HGB 10.5* 11.5*  HCT 32.5* 34.6*  PLT 296 381   Recent Labs    10/17/19 0519 10/18/19 0409  NA 133* 137  K 3.7 4.0  CL 100 103  CO2 23 26  GLUCOSE 99 122*  BUN 10 12  CREATININE 0.34* 0.41*  CALCIUM 8.4* 8.6*    Blood smear review: None  Pathology: None    Assessment and Plan: Ms. Nhem is a 64 year old yo female with progressive non-small cell lung cancer.  Her problem is the progression of brain metastases.  I am sure this is what will eventually cause her to pass on.  Our goal is to try to get her home today.  She really wants to go home today.  I think the one thing needs to be done is the Doppler of her legs.  We will see with looks like.  She may need to have a IVC filter in place.  Pain control is paramount.  Hopefully, the fentanyl patches and Dilaudid will be able to help.  I would get her on Keppra to help prevent seizures.  She will need Diflucan help prevent thrush from the oral Decadron.  I will contact Hospice so that they can be on board when she gets home.  I very much appreciate the wonderful care that she is getting from the staff on [place], MD  2 Christia Reading 4:16-18

## 2019-10-18 NOTE — Progress Notes (Signed)
Daily Progress Note   Patient Name: [name]       Date: 10/18/2019 DOB: [birth year]  Age: 64 y.o. MRN#: [id number] Attending Physician: Patrecia Pour, MD Primary Care Physician: [name], MD Admit Date: 10/17/2019  Reason for Consultation/Follow-up: Establishing goals of care  Subjective: Awake, resting in bed, complains of generalized pain, husband at bedside, patient is tearful and says that all she wants is to go home as soon as possible. We discussed about current venous doppler results, possible IVC filter placement before D/C. We talked about home with hospice arrangements, DME and hospice philosophy of care at some length.   Length of Stay: 1  Current Medications: Scheduled Meds:  . Chlorhexidine Gluconate Cloth  6 each Topical Daily  . dexamethasone  8 mg Oral Q8H  . fentaNYL  1 patch Transdermal Q48H   And  . fentaNYL  1 patch Transdermal Q48H  . fluconazole  100 mg Oral Daily  . levETIRAcetam  500 mg Oral BID  . pantoprazole  40 mg Oral BID    Continuous Infusions: . heparin 1,000 Units/hr (10/17/19 2156)    PRN Meds: HYDROmorphone, ondansetron **OR** ondansetron (ZOFRAN) IV, sodium chloride flush  Physical Exam         Awake alert With generalized weakness Regular work of breathing S1 S2 Some edema both LE Non focal  Vital Signs: BP 119/73 (BP Location: Right Arm)   Pulse 72   Temp 97.7 F (36.5 C) (Oral)   Resp 10   Ht 5\' 7"  (1.702 m)   Wt 52.2 kg   SpO2 97%   BMI 18.01 kg/m  SpO2: SpO2: 97 % O2 Device: O2 Device: Nasal Cannula O2 Flow Rate: O2 Flow Rate (L/min): 4 L/min  Intake/output summary:   Intake/Output Summary (Last 24 hours) at 10/18/2019 1134 Last data filed at 10/18/2019 1010 Gross per 24 hour  Intake 10 ml  Output 1400 ml  Net  -1390 ml   LBM: Last BM Date: 10/17/19 Baseline Weight: Weight: 52.2 kg Most recent weight: Weight: 52.2 kg       Palliative Assessment/Data:      Patient Active Problem List   Diagnosis Date Noted  . Cerebral edema ([place]) 10/17/2019  . Delirium due to another medical condition, acute, hypoactive 10/17/2019  . Altered mental state 10/17/2019  .  Acute respiratory failure with hypoxia ([place]) 10/17/2019  . DNR (do not resuscitate)   . Right leg weakness   . Right arm weakness   . Bone metastases ([place]) 06/03/2019  . Sepsis ([place]) 05/13/2019  . Acute pulmonary embolism ([place]) 05/13/2019  . Acute pyelonephritis 05/13/2019  . Antineoplastic chemotherapy induced anemia 05/13/2019  . Metabolic acidosis with increased anion gap and accumulation of organic acids 05/13/2019  . Hypokalemia 05/13/2019  . Anxiety 05/13/2019  . Goals of care, counseling/discussion 04/24/2019  . Palliative care by specialist   . DNR (do not resuscitate) discussion   . Pain from bone metastases ([facility])   . Leucocytosis   . Hypoalbuminemia due to protein-calorie malnutrition ([place])   . Lung cancer metastatic to brain ([place]) 04/13/2019  . Postoperative pain   . Spasticity   . Chronic deep vein thrombosis (DVT) of right peroneal vein ([place])   . Adenocarcinoma of lung, stage 4, right ([place]) 04/11/2019  . Metastatic cancer to brain ([place]) 04/05/2019  . Brain mass 04/01/2019  . Pain in joint of right hip 01/14/2019  . Closed pelvic ring fracture ([place]) 10/17/2016  . Intermittent explosive disorder 06/13/2016  . Primary osteoarthritis of first carpometacarpal joint of left hand 05/26/2015  . Calf pain 10/10/2013  . Foot pain 10/10/2013  . Chest pain 01/11/2013  . HYPERCHOLESTEROLEMIA WITH HIGH HDL 04/08/2009  . Essential hypertension 04/08/2009  . TOBACCO ABUSE 11/20/2008    Palliative Care Assessment & Plan   Patient Profile:   Assessment: 64 yo lady with metastatic non small cell lung cancer PE DVT  Generalized weakness Functional decline Generalized pain.  PPS 30%  Recommendations/Plan:  Home with hospice  Increase PRN PO Dilaudid  Continue trans dermal fentanyl.  Prognosis likely 3-4 weeks.   Code Status:    Code Status Orders  (From admission, onward)         Start     Ordered   10/17/19 0905  Do not attempt resuscitation (DNR)  Continuous       Question Answer Comment  In the event of cardiac or respiratory ARREST Do not call a "code blue"   In the event of cardiac or respiratory ARREST Do not perform Intubation, CPR, defibrillation or ACLS   In the event of cardiac or respiratory ARREST Use medication by any route, position, wound care, and other measures to relive pain and suffering. May use oxygen, suction and manual treatment of airway obstruction as needed for comfort.      10/17/19 0905        Code Status History    Date Active Date Inactive Code Status Order ID Comments User Context   10/17/2019 0831 10/17/2019 0905 DNR [id number]  Merrily Pew, MD ED   05/13/2019 0739 05/17/2019 1651 Full Code [id number]  Norval Morton, MD ED   04/13/2019 1607 04/23/2019 1758 Full Code [id number]  Flora Lipps Inpatient   04/01/2019 2117 04/13/2019 1550 Full Code [id number]  Patricia Nettle, NP ED   Advance Care Planning Activity    Advance Directive Documentation     Most Recent Value  Type of Advance Directive Healthcare Power of Attorney, Living will  Pre-existing out of facility DNR order (yellow form or pink MOST form) -  "MOST" Form in Place? -       Prognosis:   < 4 weeks  Discharge Planning:  Home with Hospice  Care plan was discussed with  Patient and her husband, also discussed with TOC.   Thank you for  allowing the Palliative Medicine Team to assist in the care of this patient.   Time In: 11 Time Out: 11.35 Total Time 35 Prolonged Time Billed No       Greater than 50%  of this time was spent counseling and coordinating care related to  the above assessment and plan.  Loistine Chance, MD  Please contact Palliative Medicine Team phone at [phone] for questions and concerns.

## 2019-10-18 NOTE — Progress Notes (Signed)
ANTICOAGULATION CONSULT NOTE   Pharmacy Consult for heparin Indication: acute bilateral pulmonary embolus and hx DVT  No Known Allergies  Patient Measurements: Height: 5\' 7"  (170.2 cm) Weight: 52.2 kg (115 lb) IBW/kg (Calculated) : 61.6 Heparin Dosing Weight: Use TBW = 52 kg  Vital Signs: Temp: 97.7 F (36.5 C) (06/25 0630) Temp Source: Oral (06/25 0630) BP: 119/73 (06/25 0630) Pulse Rate: 72 (06/25 0630)  Labs: Recent Labs    10/17/19 0519 10/17/19 0529 10/17/19 0823 10/17/19 1841 10/18/19 0409 10/18/19 1015  HGB 10.5*  --   --   --  11.5*  --   HCT 32.5*  --   --   --  34.6*  --   PLT 296  --   --   --  381  --   APTT 49*  --   --   --   --   --   LABPROT 15.2  --   --   --   --   --   INR 1.3*  --   --   --   --   --   HEPARINUNFRC  --   --   --  0.16* 0.41 0.43  CREATININE 0.34*  --   --   --  0.41*  --   TROPONINIHS  --  10 9  --   --   --     Estimated Creatinine Clearance: 59.3 mL/min (A) (by C-G formula based on SCr of 0.41 mg/dL (L)).   Medications:  - on Pradaxa 150 mg bid PTA (last dose taken on 6/23 at 8PM)  Assessment: Patient's a 64 y.o F with metastatic lung cancer to the bone and brain, presented to the ED on 6/24 with c/o AMS and was found to be hypoxic in the ED.  Chest CTA showed acute bilateral PE with suspected right heart strain.  Pharmacy is consulted to start heparin for acute PE.  Today, 10/18/2019:  CBC: Hgb 11.5 slightly low but improved; Plt WNL  0409 HL = 0.41 is therapeutic on heparin infusion of 1000 units/hr  1015 confirmatory level is 0.43 therapeutic  No bleeding noted  Goal of Therapy:  Heparin level 0.3-0.7 units/ml Monitor platelets by anticoagulation protocol: Yes   Plan:   Continue heparin infusion at current rate of 1000 units/hr  No boluses per MD request  Daily HL, CBC daily while on heparin  [place] 10/18/2019, 10:43 AM

## 2019-10-18 NOTE — ED Provider Notes (Signed)
[facility] Provider Note   CSN: [id number] Arrival date & time: 10/17/19  0453     History Chief Complaint  Patient presents with  . CA Pt  . Altered Mental Status    [name] is a 64 y.o. female.  Patient stated that she had some left-sided thoracic pain/abdominal pain earlier tonight and overall did not feel well.  Went to bed and woke up then patient was significantly disoriented and altered so the oxygen saturation was 78% EMS was called brought here for further evaluation.  The history is provided by the EMS personnel and the spouse.  Altered Mental Status Presenting symptoms: behavior changes and lethargy   Severity:  Mild Most recent episode:  Today Episode history:  Single Duration:  5 hours Timing:  Constant Progression:  Worsening Chronicity:  New Context: recent illness        Past Medical History:  Diagnosis Date  . Bronchitis   . Goals of care, counseling/discussion 04/24/2019  . Hypertension   . Lung cancer ([place])    with painful bone mets  . Osteoarthritis of metacarpophalangeal (MCP) joint of left thumb   . Pelvic ring fracture ([place]) 09/2016   left    Patient Active Problem List   Diagnosis Date Noted  . Cerebral edema ([place]) 10/17/2019  . Delirium due to another medical condition, acute, hypoactive 10/17/2019  . Altered mental state 10/17/2019  . Acute respiratory failure with hypoxia ([place]) 10/17/2019  . DNR (do not resuscitate)   . Right leg weakness   . Right arm weakness   . Bone metastases ([place]) 06/03/2019  . Sepsis ([place]) 05/13/2019  . Acute pulmonary embolism ([place]) 05/13/2019  . Acute pyelonephritis 05/13/2019  . Antineoplastic chemotherapy induced anemia 05/13/2019  . Metabolic acidosis with increased anion gap and accumulation of organic acids 05/13/2019  . Hypokalemia 05/13/2019  . Anxiety 05/13/2019  . Goals of care, counseling/discussion 04/24/2019  . Palliative care by specialist    . DNR (do not resuscitate) discussion   . Pain from bone metastases ([facility])   . Leucocytosis   . Hypoalbuminemia due to protein-calorie malnutrition ([place])   . Lung cancer metastatic to brain ([place]) 04/13/2019  . Postoperative pain   . Spasticity   . Chronic deep vein thrombosis (DVT) of right peroneal vein ([place])   . Adenocarcinoma of lung, stage 4, right ([place]) 04/11/2019  . Metastatic cancer to brain ([place]) 04/05/2019  . Brain mass 04/01/2019  . Pain in joint of right hip 01/14/2019  . Closed pelvic ring fracture ([place]) 10/17/2016  . Intermittent explosive disorder 06/13/2016  . Primary osteoarthritis of first carpometacarpal joint of left hand 05/26/2015  . Calf pain 10/10/2013  . Foot pain 10/10/2013  . Chest pain 01/11/2013  . HYPERCHOLESTEROLEMIA WITH HIGH HDL 04/08/2009  . Essential hypertension 04/08/2009  . TOBACCO ABUSE 11/20/2008    Past Surgical History:  Procedure Laterality Date  . APPLICATION OF CRANIAL NAVIGATION Left 04/05/2019   Procedure: APPLICATION OF CRANIAL NAVIGATION;  Surgeon: Earnie Larsson, MD;  Location: [facility];  Service: Neurosurgery;  Laterality: Left;  . CRANIOTOMY Left 04/05/2019   Procedure: LEFT FRONTAL CRANIOTOMY TUMOR EXCISION w/Brain Lab;  Surgeon: Earnie Larsson, MD;  Location: [facility];  Service: Neurosurgery;  Laterality: Left;  LEFT FRONTAL CRANIOTOMY TUMOR EXCISION w/Brain Lab  . IR IMAGING GUIDED PORT INSERTION  05/08/2019     OB History   No obstetric history on file.     Family History  Problem Relation Age of  Onset  . CAD Father        CABG  . Stroke Father   . Lymphoma Brother   . Breast cancer Neg Hx   . Pancreatic cancer Neg Hx   . Prostate cancer Neg Hx   . Colon cancer Neg Hx     Social History   Tobacco Use  . Smoking status: Former Smoker    Packs/day: 0.50    Years: 25.00    Pack years: 12.50    Types: Cigarettes    Quit date: 06/11/2019    Years since quitting: 0.3  . Smokeless tobacco: Never Used  Vaping Use  .  Vaping Use: Never used  Substance Use Topics  . Alcohol use: No    Alcohol/week: 0.0 standard drinks  . Drug use: Yes    Types: Marijuana    Comment: when needed    Home Medications Prior to Admission medications   Medication Sig Start Date End Date Taking? Authorizing Provider  acetaminophen (TYLENOL) 325 MG tablet Take 2 tablets (650 mg total) by mouth every 6 (six) hours. 04/22/19  Yes Love, Ivan Anchors, PA-C  dabigatran (PRADAXA) 150 MG CAPS capsule Take 1 capsule (150 mg total) by mouth 2 (two) times daily. 08/08/19  Yes Volanda Napoleon, MD  dronabinol (MARINOL) 5 MG capsule Take 1 capsule (5 mg total) by mouth 2 (two) times daily before lunch and supper. 09/02/19  Yes Ennever, Rudell Cobb, MD  feeding supplement, ENSURE ENLIVE, (ENSURE ENLIVE) LIQD Take 237 mLs by mouth 2 (two) times daily between meals. 04/11/19  Yes Viona [name] D, NP  fentaNYL (DURAGESIC) 100 MCG/HR Place 2 patches onto the skin every other day.   Yes [provider]  folic acid (FOLVITE) 1 MG tablet Take 1 tablet (1 mg total) by mouth daily. Start 5-7 days before Alimta chemotherapy. Continue until 21 days after Alimta completed. 07/17/19  Yes Volanda Napoleon, MD  gabapentin (NEURONTIN) 300 MG capsule Take 1 capsule (300 mg total) by mouth 3 (three) times daily. Patient taking differently: Take 300 mg by mouth 3 (three) times daily as needed.  06/19/19  Yes Ennever, Rudell Cobb, MD  lactulose (CHRONULAC) 10 GM/15ML solution Take 30 mLs (20 g total) by mouth 2 (two) times daily. 05/06/19  Yes Volanda Napoleon, MD  lidocaine-prilocaine (EMLA) cream Apply to affected area once 07/03/19  Yes Ennever, Rudell Cobb, MD  lip balm (CARMEX) ointment Apply topically as needed for lip care. 05/17/19  Yes Swayze, Ava, DO  loratadine (CLARITIN) 10 MG tablet Take 1 tablet (10 mg total) by mouth daily. Patient taking differently: Take 10 mg by mouth daily as needed for allergies.  05/17/19  Yes Swayze, Ava, DO  LORazepam (ATIVAN) 1 MG  tablet Take 1 tablet (1 mg total) by mouth every 6 (six) hours as needed for anxiety. 09/25/19  Yes Ennever, Rudell Cobb, MD  methocarbamol (ROBAXIN) 500 MG tablet Take 1 tablet (500 mg total) by mouth every 6 (six) hours as needed for muscle spasms. 05/21/19  Yes Volanda Napoleon, MD  morphine (MSIR) 15 MG tablet Take 1 tablet (15 mg total) by mouth every 4 (four) hours as needed for severe pain. Patient taking differently: Take 30 mg by mouth every 4 (four) hours as needed for severe pain.  10/03/19  Yes Volanda Napoleon, MD  Multiple Vitamin (MULTIVITAMIN WITH MINERALS) TABS tablet Take 1 tablet by mouth daily. 05/17/19  Yes Swayze, Ava, DO  OLANZapine (ZYPREXA) 10 MG tablet Take 1  tablet (10 mg total) by mouth at bedtime. 09/20/19  Yes [place], Holli Humbles, NP  ondansetron (ZOFRAN ODT) 8 MG disintegrating tablet Take 1 tablet (8 mg total) by mouth every 8 (eight) hours as needed for nausea or vomiting. 07/17/19  Yes Ennever, Rudell Cobb, MD  oxymetazoline (AFRIN) 0.05 % nasal spray Place 1 spray into both nostrils 2 (two) times daily as needed for congestion.   Yes [provider]  pantoprazole (PROTONIX) 40 MG tablet TAKE 1 TABLET (40 MG TOTAL) BY MOUTH DAILY WITH SUPPER. 08/28/19  Yes Ennever, Rudell Cobb, MD  polyvinyl alcohol (LIQUIFILM TEARS) 1.4 % ophthalmic solution Place 1 drop into both eyes as needed for dry eyes.   Yes [provider]  promethazine (PHENERGAN) 12.5 MG tablet TAKE 1-2 TABLETS BY MOUTH EVERY 4 (FOUR) HOURS AS NEEDED FOR REFRACTORY NAUSEA / VOMITING. Patient taking differently: Take 12.5-25 mg by mouth every 4 (four) hours as needed for refractory nausea / vomiting.  06/06/19  Yes Ennever, Rudell Cobb, MD  senna-docusate (SENOKOT-S) 8.6-50 MG tablet Take 1 tablet by mouth 2 (two) times daily. 08/15/19  Yes Volanda Napoleon, MD  dexamethasone (DECADRON) 2 MG tablet Take decadron 2 mg once daily until 10/10/2019 then half tablet (7m) daily for seven days and STOP. Patient taking  differently: Take 0.5 mg by mouth 7 days.  10/04/19   MTyler Pita MD  NARCAN 4 MG/0.1ML LIQD nasal spray kit Place 1 spray into the nose once as needed (overdose).  Patient not taking: Reported on 10/09/2019 04/23/19   [provider]    Allergies    Patient has no known allergies.  Review of Systems   Review of Systems  Unable to perform ROS: Mental status change    Physical Exam Updated Vital Signs BP 114/67 (BP Location: Left Arm)   Pulse 69   Temp (!) 97.5 F (36.4 C) (Oral)   Resp 16   Ht '5\' 7"'  (1.702 m)   Wt 52.2 kg   SpO2 95%   BMI 18.01 kg/m   Physical Exam Vitals and nursing note reviewed.  HENT:     Head: Normocephalic and atraumatic.     Nose: No congestion or rhinorrhea.  Eyes:     Pupils: Pupils are equal, round, and reactive to light.  Cardiovascular:     Rate and Rhythm: Normal rate.  Pulmonary:     Effort: Pulmonary effort is normal.  Abdominal:     General: There is no distension.     Tenderness: There is no abdominal tenderness.  Musculoskeletal:        General: No swelling.     Cervical back: Normal range of motion. No tenderness.  Skin:    General: Skin is warm and dry.  Neurological:     Mental Status: She is alert.     Comments: Doesn't follow commands to cooperate. Moves extremities spontaneously.     ED Results / Procedures / Treatments   Labs (all labs ordered are listed, but only abnormal results are displayed) Labs Reviewed  COMPREHENSIVE METABOLIC PANEL - Abnormal; Notable for the following components:      Result Value   Sodium 133 (*)    Creatinine, Ser 0.34 (*)    Calcium 8.4 (*)    Total Protein 6.1 (*)    Albumin 3.0 (*)    AST 13 (*)    All other components within normal limits  CBC WITH DIFFERENTIAL/PLATELET - Abnormal; Notable for the following components:   RBC 3.38 (*)  Hemoglobin 10.5 (*)    HCT 32.5 (*)    Neutro Abs 8.7 (*)    Abs Immature Granulocytes 0.22 (*)    All other components within  normal limits  APTT - Abnormal; Notable for the following components:   aPTT 49 (*)    All other components within normal limits  PROTIME-INR - Abnormal; Notable for the following components:   INR 1.3 (*)    All other components within normal limits  HEPARIN LEVEL (UNFRACTIONATED) - Abnormal; Notable for the following components:   Heparin Unfractionated 0.16 (*)    All other components within normal limits  SARS CORONAVIRUS 2 BY RT PCR (HOSPITAL ORDER, [facility])  CULTURE, BLOOD (ROUTINE X 2)  CULTURE, BLOOD (ROUTINE X 2)  URINE CULTURE  LACTIC ACID, PLASMA  LACTIC ACID, PLASMA  URINALYSIS, ROUTINE W REFLEX MICROSCOPIC  BRAIN NATRIURETIC PEPTIDE  COMPREHENSIVE METABOLIC PANEL  CBC  HEPARIN LEVEL (UNFRACTIONATED)  TROPONIN I (HIGH SENSITIVITY)  TROPONIN I (HIGH SENSITIVITY)    EKG EKG Interpretation  Date/Time:  Thursday October 17 2019 05:05:35 EDT Ventricular Rate:  86 PR Interval:    QRS Duration: 82 QT Interval:  343 QTC Calculation: 411 R Axis:   18 Text Interpretation: Sinus rhythm No significant change since last tracing Confirmed by Merrily Pew [phone]) on 10/17/2019 5:50:12 AM   Radiology EEG  Result Date: 10/17/2019 Lora Havens, MD     10/17/2019  1:53 PM Patient Name: Brilynn Biasi MRN: [id number] Epilepsy Attending: Lora Havens Referring Physician/Provider: Dr Oretha Milch Date: 10/17/2019 Duration: 24.32 mins Patient history: 64yo F altered mental status, worsening cerebral vasogenic edema with midline shift in setting of known cerebral metastasis. EEG to evaluate for seizure. Level of alertness: Lethargic AEDs during EEG study: None Technical aspects: This EEG study was done with scalp electrodes positioned according to the 10-20 International system of electrode placement. Electrical activity was acquired at a sampling rate of '500Hz'  and reviewed with a high frequency filter of '70Hz'  and a low frequency filter of '1Hz' . EEG data  were recorded continuously and digitally stored. Description: No posterior dominant rhythm was seen. EEG showed continuous generalized polymorphic 5-'9hz'  theta-alpha activity. Hyperventilation and photic stimulation were not performed.   ABNORMALITY -Continuous slow, generalized IMPRESSION: This study is suggestive of mild to moderate diffuse encephalopathy, nonspecific etiology. No seizures or epileptiform discharges were seen throughout the recording. Lora Havens   CT Head Wo Contrast  Result Date: 10/17/2019 CLINICAL DATA:  Altered mental status (AMS), unclear cause. Additional history provided: Low oxygen saturation, right lung cancer with bone and brain metastases, craniotomy for tumor resection 04/05/2019. EXAM: CT HEAD WITHOUT CONTRAST TECHNIQUE: Contiguous axial images were obtained from the base of the skull through the vertex without intravenous contrast. COMPARISON:  Brain MRI 08/14/2019, head CT 05/14/2019 FINDINGS: Brain: Again demonstrated is extensive multifocal vasogenic edema within the bilateral cerebral hemispheres in this patient with multiple known cerebral and cerebellar metastases. The metastases themselves were better appreciated on prior MRI 08/14/2018. As compared to this prior exam, vasogenic edema within the posterior cerebral hemispheres and left temporal lobe have significantly increased. Redemonstrated patchy edema within the cerebellum. There is asymmetric mass effect. Unchanged 2 mm leftward midline shift measured at the level of the septum pellucidum. Redemonstrated sequela of prior left parietal craniotomy with resection cavity in the underlying left frontoparietal lobes. There is no acute intracranial hemorrhage. No extra-axial fluid collection. Unchanged background chronic small vessel ischemic disease with chronic lacunar  infarcts in the basal ganglia and thalami. Vascular: No hyperdense vessel.  Atherosclerotic calcifications. Skull: Prior left parietal craniotomy. No  calvarial fracture or suspicious osseous lesion. Sinuses/Orbits: Visualized orbits show no acute finding. No significant paranasal sinus disease or mastoid effusion at the imaged levels. IMPRESSION: Again demonstrated is extensive multifocal vasogenic edema within the cerebral hemispheres in this patient with multiple known cerebral and cerebellar metastases. The metastases themselves were better appreciated on prior MRI 08/14/2018. As compared to this prior exam, vasogenic edema within the posterior cerebral hemispheres and left temporal lobe have significantly increased. Contrast-enhanced brain MRI may be obtained to better assess for progression of intracranial metastatic disease, as clinically warranted. Redemonstrated patchy edema within the cerebellum. Asymmetric mass effect with unchanged 2 mm leftward midline shift. Redemonstrated sequela of prior left frontoparietal tumor resection. No acute intracranial hemorrhage or herniation. Electronically Signed   By: Kellie Simmering DO   On: 10/17/2019 07:41   CT Angio Chest PE W and/or Wo Contrast  Result Date: 10/17/2019 CLINICAL DATA:  Shortness of breath. EXAM: CT ANGIOGRAPHY CHEST WITH CONTRAST TECHNIQUE: Multidetector CT imaging of the chest was performed using the standard protocol during bolus administration of intravenous contrast. Multiplanar CT image reconstructions and MIPs were obtained to evaluate the vascular anatomy. CONTRAST:  66m OMNIPAQUE IOHEXOL 350 MG/ML SOLN COMPARISON:  August 06, 2019. FINDINGS: Cardiovascular: Large filling defect is seen in lower lobe branch of left pulmonary artery consistent with pulmonary embolus. Smaller embolus is noted in lower lobe branch of right pulmonary artery. RV/LV ratio of approximately 1.0 is noted suggesting possible right heart strain. Normal cardiac size. No pericardial effusion. Atherosclerosis of thoracic aorta is noted without aneurysm or dissection. Mediastinum/Nodes: 1.9 cm subcarinal lymph node is  noted. Grossly stable precarinal mass is noted measuring 2.7 cm. Thyroid gland is unremarkable. The esophagus appears normal. Lungs/Pleura: No pneumothorax is noted. Small bilateral pleural effusions are noted with adjacent subsegmental atelectasis. Left lower lobe large airspace opacity is noted which may represent atelectasis, pneumonia or infarction. 2.8 cm left upper lobe mass is noted in perihilar region which is enlarged compared to prior exam. 2 cm rounded nodule is noted in the superior segment of right lower lobe which is enlarged compared to prior exam. 1 cm nodule is noted in right lower lobe which is new since prior exam. 5 mm subpleural nodule is noted anteriorly in the left upper lobe. Upper Abdomen: No acute abnormality. Musculoskeletal: Mildly displaced fracture is seen involving the lateral portion of a left lower rib. Continued sclerotic densities are noted in the thoracic spine consistent with metastatic disease. Review of the MIP images confirms the above findings. IMPRESSION: 1. Large filling defect is seen in lower lobe branch of left pulmonary artery consistent with pulmonary embolus. Smaller embolus is noted in lower lobe branch of right pulmonary artery. RV/LV ratio of approximately 1.0 is noted suggesting possible right heart strain. Critical Value/emergent results were called by telephone at the time of interpretation on 10/17/2019 at 8:01 am to [facility], who verbally acknowledged these results. 2. Large left lower lobe opacity is noted posteriorly which may represent atelectasis, pneumonia or possibly pulmonary infarction. 3. Small bilateral pleural effusions are noted with adjacent subsegmental atelectasis. 4. Left upper lobe mass is noted in perihilar region which is enlarged compared to prior exam consistent with metastatic disease. 5. 2 cm rounded nodule is noted in the superior segment of the right lower lobe which is enlarged compared to prior exam consistent with  metastatic disease. 6. 1 cm nodule is noted in the right lower lobe which is new since prior exam consistent with metastatic disease. 7. Continued sclerotic densities are noted in the thoracic spine consistent with metastatic disease. 8. Mildly displaced fracture is seen involving the lateral portion of a left lower rib. Aortic Atherosclerosis (ICD10-I70.0). Electronically Signed   By: Marijo Conception M.D.   On: 10/17/2019 08:02   MR BRAIN WO CONTRAST  Result Date: 10/17/2019 CLINICAL DATA:  known cerebral mets, worsening vasogenic edema and midline shift with worsening confusion; Focal neuro deficit, > 6 hrs, stroke suspected. EXAM: MRI HEAD WITHOUT CONTRAST TECHNIQUE: Multiplanar, multiecho pulse sequences of the brain and surrounding structures were obtained without intravenous contrast. COMPARISON:  Head CT October 17, 2019; MRI of the brain August 14, 2019. FINDINGS: Patient unable to tolerate lying still in the scanner for the duration of the study. Study was terminated prematurely. Brain: No restricted diffusion to suggest an acute infarct. Prominent multifocal vasogenic edema with a 2 mm leftward midline shift not significantly changed from prior CT. Foci of susceptibility artifacts noted in the right frontal lobe, left temporal lobe, left cerebellar hemisphere and vermis likely represent hemosiderin deposits within metastatic lesions. Compared to prior MRI, there is interval decrease of the vasogenic edema in the anterior frontal lobe bilaterally with increased vasogenic edema in the posterior right frontal lobe and new vasogenic edema in the right parietal and left temporal lobes. In the posterior fossa, there is interval decrease of the vasogenic edema within the right middle cerebellar peduncle/cerebellar hemisphere and left cerebellar hemisphere when compared to prior MRI. No contrast administration performed. Vascular: Normal flow voids. Skull and upper cervical spine: Postsurgical changes from left  parietal craniotomy. Otherwise, normal marrow signal. Sinuses/Orbits: Negative. Other: None. IMPRESSION: 1. Incomplete study. [place] unable to tolerate lying still in the scanner for the duration of the study. 2. Prominent multifocal areas of vasogenic edema with a 2 mm leftward midline shift not significantly changed from prior CT performed earlier today. 3. Compared to MRI performed in April 2021, areas of increased vasogenic edema are seen in the posterior right frontal and parietal regions and left temporal lobe while a decreasing vasogenic is noted in the bilateral anterior frontal lobes. Electronically Signed   By: Pedro Earls M.D.   On: 10/17/2019 12:03   DG Chest Port 1 View  Result Date: 10/17/2019 CLINICAL DATA:  Altered mental status EXAM: PORTABLE CHEST 1 VIEW COMPARISON:  CT 08/06/2019, radiograph 05/13/2019 FINDINGS: Accessed right IJ Port-A-Cath tip terminates near the cavoatrial junction. Cardiac monitoring leads and support devices overlie the chest. Redemonstration of the bilateral pulmonary masses, several of which are poorly visualized on this radiograph and certainly better seen on comparison CT imaging. There is increasing hazy interstitial opacity throughout the lungs with indistinct vascularity and developing left and possible trace right pleural effusions. No pneumothorax. Stable cardiomediastinal contours with a calcified aorta. No acute osseous or soft tissue abnormality. Degenerative changes are present in the imaged spine and shoulders. IMPRESSION: Increasing hazy interstitial opacity throughout the lungs with indistinct vascularity as well as developing small left and possible trace right pleural effusions. Findings are concerning for pulmonary edema. Redemonstration of the multiple pulmonary nodules and masses, better seen on comparison CT. Aortic Atherosclerosis (ICD10-I70.0). Electronically Signed   By: Lovena Le M.D.   On: 10/17/2019 06:28    ECHOCARDIOGRAM LIMITED  Result Date: 10/17/2019    ECHOCARDIOGRAM LIMITED REPORT   Patient Name:  Gurtha Pangallo Date of Exam: 10/17/2019 Medical Rec #:  [id number]     Height:       67.0 in Accession #:    [id number]    Weight:       115.0 lb Date of Birth:  1956/04/15      BSA:          1.598 m Patient Age:    61 years      BP:           101/65 mmHg Patient Gender: F             HR:           69 bpm. Exam Location:  Inpatient Procedure: Limited Echo, Cardiac Doppler and Color Doppler Indications:    I26.02 Pulmonary embolus  History:        Patient has prior history of Echocardiogram examinations, most                 recent 05/13/2019. Risk Factors:Hypertension. Lung Cancer.  Sonographer:    Tiffany Dance Referring Phys: [id number] [place]  1. Left ventricular ejection fraction, by estimation, is 60 to 65%. The left ventricle has normal function. The left ventricle has no regional wall motion abnormalities. Left ventricular diastolic parameters were normal.  2. Right ventricular systolic function is normal. The right ventricular size is normal. There is normal pulmonary artery systolic pressure. The estimated right ventricular systolic pressure is 20.9 mmHg.  3. The mitral valve is normal in structure. No evidence of mitral valve regurgitation.  4. The aortic valve is tricuspid. Aortic valve regurgitation is not visualized. No aortic stenosis is present.  5. The inferior vena cava is normal in size with greater than 50% respiratory variability, suggesting right atrial pressure of 3 mmHg. FINDINGS  Left Ventricle: Left ventricular ejection fraction, by estimation, is 60 to 65%. The left ventricle has normal function. The left ventricle has no regional wall motion abnormalities. The left ventricular internal cavity size was normal in size. There is  no left ventricular hypertrophy. Right Ventricle: The right ventricular size is normal. Right vetricular wall thickness was not assessed. Right  ventricular systolic function is normal. There is normal pulmonary artery systolic pressure. The tricuspid regurgitant velocity is 2.18 m/s, and with an assumed right atrial pressure of 3 mmHg, the estimated right ventricular systolic pressure is 47.0 mmHg. Left Atrium: Left atrial size was normal in size. Right Atrium: Right atrial size was normal in size. Pericardium: Trivial pericardial effusion is present. Mitral Valve: The mitral valve is normal in structure. Tricuspid Valve: The tricuspid valve is normal in structure. Tricuspid valve regurgitation is trivial. Aortic Valve: The aortic valve is tricuspid. Aortic valve regurgitation is not visualized. No aortic stenosis is present. Pulmonic Valve: The pulmonic valve was not well visualized. Pulmonic valve regurgitation is not visualized. Aorta: The aortic root and ascending aorta are structurally normal, with no evidence of dilitation. Venous: The inferior vena cava is normal in size with greater than 50% respiratory variability, suggesting right atrial pressure of 3 mmHg. IAS/Shunts: No atrial level shunt detected by color flow Doppler.  LEFT VENTRICLE PLAX 2D LVIDd:         4.20 cm  Diastology LVIDs:         2.70 cm  LV e' lateral:   9.68 cm/s LV PW:         1.00 cm  LV E/e' lateral: 8.2 LV IVS:        0.90  cm  LV e' medial:    8.27 cm/s LVOT diam:     1.90 cm  LV E/e' medial:  9.6 LV SV:         79 LV SV Index:   49 LVOT Area:     2.84 cm  RIGHT VENTRICLE RV Basal diam:  2.80 cm RV S prime:     14.50 cm/s TAPSE (M-mode): 2.0 cm LEFT ATRIUM             Index       RIGHT ATRIUM           Index LA diam:        4.30 cm 2.69 cm/m  RA Area:     10.80 cm LA Vol (A2C):   30.4 ml 19.02 ml/m RA Volume:   19.70 ml  12.32 ml/m LA Vol (A4C):   18.6 ml 11.64 ml/m LA Biplane Vol: 23.3 ml 14.58 ml/m  AORTIC VALVE LVOT Vmax:   138.00 cm/s LVOT Vmean:  89.300 cm/s LVOT VTI:    0.279 m  AORTA Ao Root diam: 3.00 cm Ao Asc diam:  2.70 cm MITRAL VALVE               TRICUSPID  VALVE MV Area (PHT): 3.37 cm    TR Peak grad:   19.0 mmHg MV Decel Time: 225 msec    TR Vmax:        218.00 cm/s MV E velocity: 79.80 cm/s MV A velocity: 87.30 cm/s  SHUNTS MV E/A ratio:  0.91        Systemic VTI:  0.28 m                            Systemic Diam: 1.90 cm Oswaldo Milian MD Electronically signed by Oswaldo Milian MD Signature Date/Time: 10/17/2019/6:21:05 PM    Final     Procedures .Critical Care Performed by: Merrily Pew, MD Authorized by: Merrily Pew, MD   Critical care provider statement:    Critical care time (minutes):  45   Critical care was necessary to treat or prevent imminent or life-threatening deterioration of the following conditions:  CNS failure or compromise   Critical care was time spent personally by me on the following activities:  Discussions with consultants, evaluation of patient's response to treatment, examination of patient, ordering and performing treatments and interventions, ordering and review of laboratory studies, ordering and review of radiographic studies, pulse oximetry, re-evaluation of patient's condition, obtaining history from patient or surrogate and review of old charts   (including critical care time)  Medications Ordered in ED Medications  sodium chloride (PF) 0.9 % injection (has no administration in time range)  ondansetron (ZOFRAN) tablet 4 mg (has no administration in time range)    Or  ondansetron (ZOFRAN) injection 4 mg (has no administration in time range)  heparin ADULT infusion 100 units/mL (25000 units/268m sodium chloride 0.45%) (1,000 Units/hr Intravenous Rate/Dose Change 10/17/19 2156)  dexamethasone (DECADRON) injection 20 mg (20 mg Intravenous Given 10/17/19 2153)  fentaNYL (DURAGESIC) 75 MCG/HR 1 patch (1 patch Transdermal Patch Applied 10/17/19 1224)    And  fentaNYL (DURAGESIC) 75 MCG/HR 1 patch (1 patch Transdermal Patch Applied 10/17/19 1223)  Chlorhexidine Gluconate Cloth 2 % PADS 6 each (has no  administration in time range)  HYDROmorphone (DILAUDID) injection 3 mg (3 mg Intravenous Given 10/17/19 2154)  lactated ringers bolus 1,000 mL (0 mLs Intravenous Stopped 10/17/19 0639)  iohexol (OMNIPAQUE)  350 MG/ML injection 100 mL (80 mLs Intravenous Contrast Given 10/17/19 0718)  piperacillin-tazobactam (ZOSYN) IVPB 3.375 g (0 g Intravenous Stopped 10/17/19 0821)  vancomycin (VANCOCIN) IVPB 1000 mg/200 mL premix (0 mg Intravenous Stopped 10/17/19 1003)  dexamethasone (DECADRON) injection 10 mg (10 mg Intravenous Given 10/17/19 0834)  levETIRAcetam (KEPPRA) IVPB 1000 mg/100 mL premix (0 mg Intravenous Stopped 10/17/19 0941)  dexamethasone (DECADRON) injection 10 mg (10 mg Intravenous Given 10/17/19 1233)    ED Course  I have reviewed the triage vital signs and the nursing notes.  Pertinent labs & imaging results that were available during my care of the patient were reviewed by me and considered in my medical decision making (see chart for details).    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  Consider starting heparin however patient will need a hematology oncology consult anyway for the second issue as well as keep her on oxygen for now and let them decide further anticoagulation needs.  Found to have worsening edema in her brain from her metastatic disease.  Discussed with neurosurgery there is no neurosurgical option.  They did recommend MRI/EEG/Keppra/Decadron.  I had this discussion with husband at this time patient will be made DNR secondary to no surgical options.  Discussed with hospitalist for admission. Final Clinical Impression(s) / ED Diagnoses Final diagnoses:  Altered mental status, unspecified altered mental status type  Cerebral edema [facility])    Rx / DC Orders ED Discharge Orders    None       Curtina Grills, Corene Cornea, MD 10/18/19 [phone]

## 2019-10-18 NOTE — Progress Notes (Signed)
Hydrologist [facility]) Hospital Liaison: RN note    Notified by Transition of Care Manger of patient/family request for [facility] services at home after discharge. Chart and patient information under review by [facility] physician. Hospice eligibility approved by Dr. Karie Georges with [facility].  Spoke with [name] patiens spouse to initiate education related to hospice philosophy, services and team approach to care.           verbalized understanding of information given. Per discussion, plan is for discharge to home by private vehicle.    Please send signed and completed DNR form home with patient/family. Patient will need prescriptions for discharge comfort medications.     DME needs have been discussed, patient currently has the following equipment in the home: 3N1, wheelchair, walker.  Patient/family requests the following DME for delivery to the home:   None. Home address has been verified and is correct in the chart.  [name] is the family member to contact to arrange time of delivery.     [facility] aware of the above.  Please notify [facility] when patient is ready to leave the unit at discharge. (Call [phone] or [phone] after 5pm.) [facility] information and contact numbers given to spouse [name].      Please call with any hospice related questions.    Thank you for this referral.     Clementeen Hoof, RN, [facility] (listed on [facility] under Hospice and [place] [facility] [place])  [phone]

## 2019-10-18 NOTE — Progress Notes (Signed)
ANTICOAGULATION CONSULT NOTE   Pharmacy Consult for heparin Indication: acute bilateral pulmonary embolus and hx DVT  No Known Allergies  Patient Measurements: Height: 5\' 7"  (170.2 cm) Weight: 52.2 kg (115 lb) IBW/kg (Calculated) : 61.6 Heparin Dosing Weight: Use TBW = 52 kg  Vital Signs: Temp: 97.7 F (36.5 C) (06/25 0241) Temp Source: Oral (06/25 0241) BP: 113/78 (06/25 0241) Pulse Rate: 69 (06/25 0241)  Labs: Recent Labs    10/17/19 0519 10/17/19 0529 10/17/19 0823 10/17/19 1841 10/18/19 0409  HGB 10.5*  --   --   --  11.5*  HCT 32.5*  --   --   --  34.6*  PLT 296  --   --   --  381  APTT 49*  --   --   --   --   LABPROT 15.2  --   --   --   --   INR 1.3*  --   --   --   --   HEPARINUNFRC  --   --   --  0.16* 0.41  CREATININE 0.34*  --   --   --  0.41*  TROPONINIHS  --  10 9  --   --     Estimated Creatinine Clearance: 59.3 mL/min (A) (by C-G formula based on SCr of 0.41 mg/dL (L)).   Medications:  - on Pradaxa 150 mg bid PTA (last dose taken on 6/23 at 8PM)  Assessment: Patient's a 64 y.o F with metastatic lung cancer to the bone and brain, presented to the ED on 6/24 with c/o AMS and was found to be hypoxic in the ED.  Chest CTA showed acute bilateral PE with suspected right heart strain.  Pharmacy is consulted to start heparin for acute PE.  Today, 10/18/2019:  CBC: Hgb 11.5 slightly low but improved; Plt WNL  HL = 0.41 is therapeutic on heparin infusion of 1000 units/hr  Confirmed with RN that heparin infusing at correct rate with no issues/interruptions. No signs of bleeding or bruising.   Goal of Therapy:  Heparin level 0.3-0.7 units/ml Monitor platelets by anticoagulation protocol: Yes   Plan:   Continue heparin infusion at current rate of 1000 units/hr  Check confirmatory HL in 6 hours  No boluses per MD request  HL, CBC daily while on heparin  Lenis Noon, PharmD 10/18/2019,5:08 AM

## 2019-10-18 NOTE — TOC Progression Note (Signed)
Transition of Care [facility]) - Progression Note    Patient Details  Name: [name] MRN: [id number] Date of Birth: 12/21/1955  Transition of Care [facility]) CM/SW Contact  Purcell Mouton, RN Phone Number: 10/18/2019, 11:52 AM  Clinical Narrative:     Pt discharging with [facility].   Expected Discharge Plan: Home w Hospice Care Barriers to Discharge: No Barriers Identified  Expected Discharge Plan and Services Expected Discharge Plan: [place] arrangements for the past 2 months: Single Family Home Expected Discharge Date: 10/18/19                                     Social Determinants of Health (SDOH) Interventions    Readmission Risk Interventions No flowsheet data found.

## 2019-10-18 NOTE — TOC Progression Note (Signed)
Transition of Care [facility]) - Progression Note    Patient Details  Name: [name] MRN: [id number] Date of Birth: 13-Apr-1956  Transition of Care [facility]) CM/SW Contact  Purcell Mouton, RN Phone Number: 10/18/2019, 11:17 AM  Clinical Narrative:     Pt will discharge home with [facility] for Hospice care. Referral was given to in house rep. Lenna Sciara, RN.        Expected Discharge Plan and Services           Expected Discharge Date: 10/18/19                                     Social Determinants of Health (SDOH) Interventions    Readmission Risk Interventions No flowsheet data found.

## 2019-10-21 ENCOUNTER — Other Ambulatory Visit: Payer: Self-pay | Admitting: *Deleted

## 2019-10-21 ENCOUNTER — Telehealth: Payer: Self-pay | Admitting: *Deleted

## 2019-10-21 MED ORDER — FENTANYL 50 MCG/HR TD PT72: 1.0000 | MEDICATED_PATCH | TRANSDERMAL | 0 refills | Status: AC

## 2019-10-21 NOTE — Telephone Encounter (Signed)
Call received from patient's husband stating that the Fentanyl 200 mcg patches are "too much" for patient and causing her to be nauseated and "not feel quite right."  Dr. Marin Olp notified and order received for pt to decrease dose of Fentanyl down to 150 mcg every 48 hours.  Eric notified of MD order and has no further questions at this time.

## 2019-10-22 ENCOUNTER — Encounter: Payer: Self-pay | Admitting: Hematology & Oncology

## 2019-10-22 LAB — CULTURE, BLOOD (ROUTINE X 2)
Culture: NO GROWTH
Culture: NO GROWTH
Special Requests: ADEQUATE
Special Requests: ADEQUATE

## 2019-10-24 DIAGNOSIS — C3412 Malignant neoplasm of upper lobe, left bronchus or lung: Secondary | ICD-10-CM | POA: Diagnosis not present

## 2019-10-25 ENCOUNTER — Ambulatory Visit (HOSPITAL_COMMUNITY): Payer: BC Managed Care – PPO

## 2019-10-25 DIAGNOSIS — C3412 Malignant neoplasm of upper lobe, left bronchus or lung: Secondary | ICD-10-CM | POA: Diagnosis not present

## 2019-10-26 DIAGNOSIS — C3412 Malignant neoplasm of upper lobe, left bronchus or lung: Secondary | ICD-10-CM | POA: Diagnosis not present

## 2019-10-27 DIAGNOSIS — C3412 Malignant neoplasm of upper lobe, left bronchus or lung: Secondary | ICD-10-CM | POA: Diagnosis not present

## 2019-10-28 DIAGNOSIS — C3412 Malignant neoplasm of upper lobe, left bronchus or lung: Secondary | ICD-10-CM | POA: Diagnosis not present

## 2019-10-29 DIAGNOSIS — C3412 Malignant neoplasm of upper lobe, left bronchus or lung: Secondary | ICD-10-CM | POA: Diagnosis not present

## 2019-10-30 DIAGNOSIS — C3412 Malignant neoplasm of upper lobe, left bronchus or lung: Secondary | ICD-10-CM | POA: Diagnosis not present

## 2019-10-31 ENCOUNTER — Inpatient Hospital Stay: Payer: BC Managed Care – PPO

## 2019-10-31 ENCOUNTER — Inpatient Hospital Stay: Payer: BC Managed Care – PPO | Attending: Hematology & Oncology

## 2019-10-31 ENCOUNTER — Inpatient Hospital Stay: Payer: BC Managed Care – PPO | Admitting: Hematology & Oncology

## 2019-10-31 DIAGNOSIS — C3412 Malignant neoplasm of upper lobe, left bronchus or lung: Secondary | ICD-10-CM | POA: Diagnosis not present

## 2019-11-01 ENCOUNTER — Ambulatory Visit (HOSPITAL_COMMUNITY): Payer: BC Managed Care – PPO

## 2019-11-01 DIAGNOSIS — C3412 Malignant neoplasm of upper lobe, left bronchus or lung: Secondary | ICD-10-CM | POA: Diagnosis not present

## 2019-11-02 DIAGNOSIS — C3412 Malignant neoplasm of upper lobe, left bronchus or lung: Secondary | ICD-10-CM | POA: Diagnosis not present

## 2019-11-03 DIAGNOSIS — C3412 Malignant neoplasm of upper lobe, left bronchus or lung: Secondary | ICD-10-CM | POA: Diagnosis not present

## 2019-11-04 ENCOUNTER — Other Ambulatory Visit: Payer: BC Managed Care – PPO

## 2019-11-04 DIAGNOSIS — C3412 Malignant neoplasm of upper lobe, left bronchus or lung: Secondary | ICD-10-CM | POA: Diagnosis not present

## 2019-11-05 DIAGNOSIS — C3412 Malignant neoplasm of upper lobe, left bronchus or lung: Secondary | ICD-10-CM | POA: Diagnosis not present

## 2019-11-06 DIAGNOSIS — C3412 Malignant neoplasm of upper lobe, left bronchus or lung: Secondary | ICD-10-CM | POA: Diagnosis not present

## 2019-11-07 ENCOUNTER — Telehealth: Payer: Self-pay | Admitting: Urology

## 2019-11-07 DIAGNOSIS — C3412 Malignant neoplasm of upper lobe, left bronchus or lung: Secondary | ICD-10-CM | POA: Diagnosis not present

## 2019-11-08 DIAGNOSIS — C3412 Malignant neoplasm of upper lobe, left bronchus or lung: Secondary | ICD-10-CM | POA: Diagnosis not present

## 2019-11-09 DIAGNOSIS — C3412 Malignant neoplasm of upper lobe, left bronchus or lung: Secondary | ICD-10-CM | POA: Diagnosis not present

## 2019-11-10 DIAGNOSIS — C3412 Malignant neoplasm of upper lobe, left bronchus or lung: Secondary | ICD-10-CM | POA: Diagnosis not present

## 2019-11-11 DIAGNOSIS — C3412 Malignant neoplasm of upper lobe, left bronchus or lung: Secondary | ICD-10-CM | POA: Diagnosis not present

## 2019-11-12 ENCOUNTER — Encounter: Payer: Self-pay | Admitting: Pharmacy Technician

## 2019-11-12 DIAGNOSIS — C3412 Malignant neoplasm of upper lobe, left bronchus or lung: Secondary | ICD-10-CM | POA: Diagnosis not present

## 2019-11-12 NOTE — Progress Notes (Signed)
Patient no longer getting Keytruda from [employer] based on Hospice care. Last DOS covered is 10/09/19.

## 2019-11-13 DIAGNOSIS — C3412 Malignant neoplasm of upper lobe, left bronchus or lung: Secondary | ICD-10-CM | POA: Diagnosis not present

## 2019-11-14 DIAGNOSIS — C3412 Malignant neoplasm of upper lobe, left bronchus or lung: Secondary | ICD-10-CM | POA: Diagnosis not present

## 2019-11-15 DIAGNOSIS — C3412 Malignant neoplasm of upper lobe, left bronchus or lung: Secondary | ICD-10-CM | POA: Diagnosis not present

## 2019-11-16 DIAGNOSIS — C3412 Malignant neoplasm of upper lobe, left bronchus or lung: Secondary | ICD-10-CM | POA: Diagnosis not present

## 2019-11-17 DIAGNOSIS — C3412 Malignant neoplasm of upper lobe, left bronchus or lung: Secondary | ICD-10-CM | POA: Diagnosis not present

## 2019-11-18 DIAGNOSIS — C3412 Malignant neoplasm of upper lobe, left bronchus or lung: Secondary | ICD-10-CM | POA: Diagnosis not present

## 2019-11-19 DIAGNOSIS — C3412 Malignant neoplasm of upper lobe, left bronchus or lung: Secondary | ICD-10-CM | POA: Diagnosis not present

## 2019-11-20 ENCOUNTER — Encounter: Payer: Self-pay | Admitting: Hematology & Oncology

## 2019-11-20 DIAGNOSIS — C3412 Malignant neoplasm of upper lobe, left bronchus or lung: Secondary | ICD-10-CM | POA: Diagnosis not present

## 2019-11-21 ENCOUNTER — Inpatient Hospital Stay: Payer: BC Managed Care – PPO

## 2019-11-21 ENCOUNTER — Inpatient Hospital Stay: Payer: BC Managed Care – PPO | Admitting: Hematology & Oncology

## 2019-11-21 DIAGNOSIS — C3412 Malignant neoplasm of upper lobe, left bronchus or lung: Secondary | ICD-10-CM | POA: Diagnosis not present

## 2019-11-22 ENCOUNTER — Ambulatory Visit (HOSPITAL_COMMUNITY): Payer: BC Managed Care – PPO | Attending: Family

## 2019-11-22 DIAGNOSIS — C3412 Malignant neoplasm of upper lobe, left bronchus or lung: Secondary | ICD-10-CM | POA: Diagnosis not present

## 2019-11-23 DIAGNOSIS — C3412 Malignant neoplasm of upper lobe, left bronchus or lung: Secondary | ICD-10-CM | POA: Diagnosis not present

## 2019-11-24 DIAGNOSIS — C3412 Malignant neoplasm of upper lobe, left bronchus or lung: Secondary | ICD-10-CM | POA: Diagnosis not present

## 2019-11-25 ENCOUNTER — Inpatient Hospital Stay: Payer: BC Managed Care – PPO | Attending: Hematology & Oncology

## 2019-11-25 ENCOUNTER — Encounter: Payer: Self-pay | Admitting: *Deleted

## 2019-11-25 DIAGNOSIS — C3412 Malignant neoplasm of upper lobe, left bronchus or lung: Secondary | ICD-10-CM | POA: Diagnosis not present

## 2019-11-27 ENCOUNTER — Ambulatory Visit: Payer: BC Managed Care – PPO | Admitting: Urology

## 2019-11-29 NOTE — Progress Notes (Signed)
  Radiation Oncology         [phone]) [phone] ________________________________  Name: [name] MRN: [id number]  Date: 06/24/2019  DOB: 1955/11/03  End of Treatment Note  Diagnosis:   64 y.o. 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     Indication for treatment:  Palliation       Radiation treatment dates:   06/11/19 - 06/24/19  Site/dose:   The chest/mediastinum were treated to a dose of 30 Gy in 10 fractions of 3 Gy each.  Beams/energy:   A 3D field set-up was employed with 6 MV X-rays  Narrative: The patient tolerated radiation treatment relatively well.     Plan: The patient has completed radiation treatment. The patient will return to radiation oncology clinic for routine followup in one month. I advised her to call or return sooner if she has any questions or concerns related to her recovery or treatment. ________________________________  Sheral Apley. Tammi Klippel, M.D.

## 2019-12-15 NOTE — Progress Notes (Signed)
  Radiation Oncology         [phone] ________________________________  Name: [name] MRN: [id number]  Date: 08/23/2019  DOB: April 17, 1956  End of Treatment Note  Diagnosis:   64 y.o.woman with 12 brain metastases from adenocarcinoma of the right upper lung - Stage IV     Indication for treatment:  Palliation       Radiation treatment dates:   08/23/19  Site/dose/beams/energy:   12 brain metastasis targets were treated using 5 Rapid Arc VMAT Beams to a prescription dose of 18-20 Gy.  ExacTrac registration was performed for each couch angle.  The 100% isodose line was prescribed.  6 MV X-rays were delivered in the flattening filter free beam mode.  Narrative: The patient tolerated radiation treatment relatively well.     Plan: The patient has completed radiation treatment. The patient will return to radiation oncology clinic for routine followup in one month. I advised her to call or return sooner if she has any questions or concerns related to her recovery or treatment. ________________________________  Sheral Apley. Tammi Klippel, M.D.

## 2019-12-25 NOTE — Progress Notes (Signed)
Fax received from [facility] stating that pt passed away this morning at 0511.  Dr. Marin Olp notified.

## 2019-12-25 DEATH — deceased

## 2020-01-09 ENCOUNTER — Other Ambulatory Visit: Payer: Self-pay | Admitting: Hematology & Oncology

## 2020-01-14 ENCOUNTER — Other Ambulatory Visit: Payer: Self-pay | Admitting: Hematology & Oncology

## 2021-06-24 IMAGING — CT CT HEAD W/O CM
3 series · 15 of 47 positions shown, 18 images · non-contrast
Comparison: 04/06/2019

CLINICAL DATA: Fell with trauma to the face

EXAM:
CT HEAD WITHOUT CONTRAST
TECHNIQUE: Contiguous axial images were obtained from the base of the skull
through the vertex without intravenous contrast.

[Series 2: head wo · axial · 0.47mm/px · z∈[-120,+15]mm · 9 of 33 slices shown, 12 images]
[im 3/33  brain]
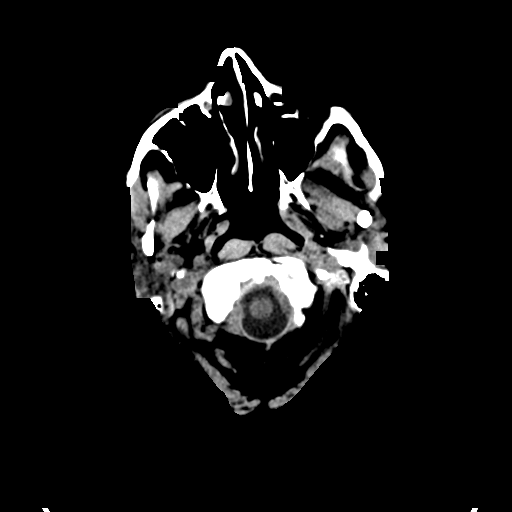
[im 3/33  bone]
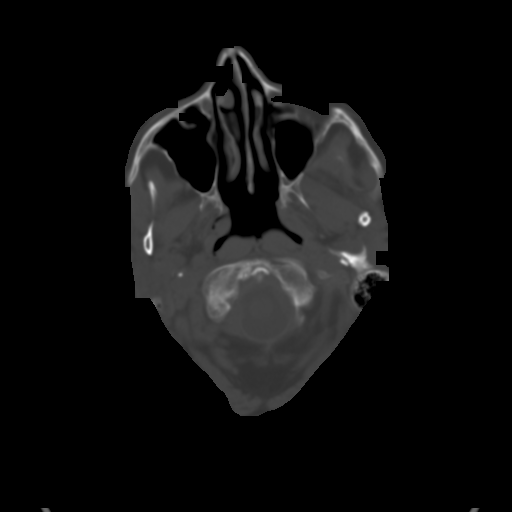
[im 6/33  brain]
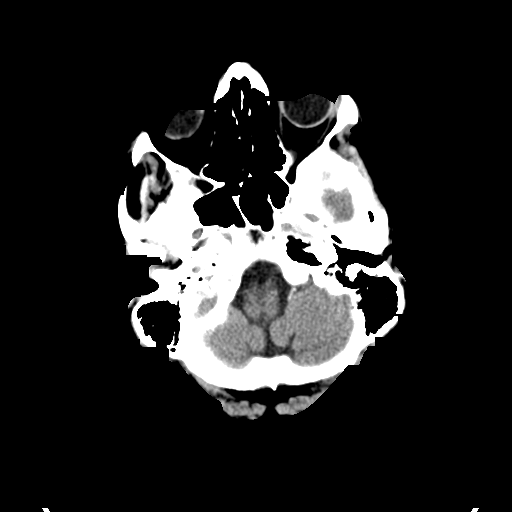
[im 9/33  brain]
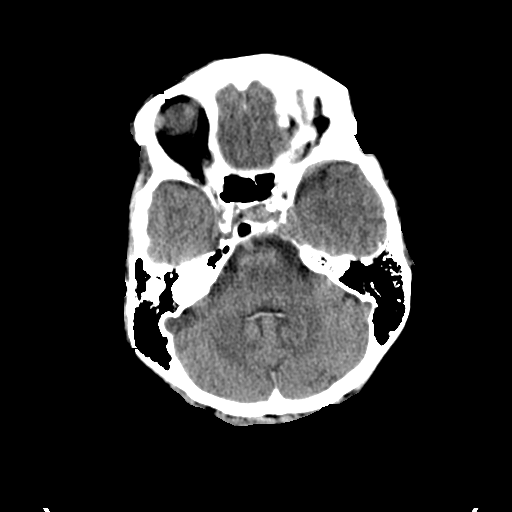
[im 13/33  brain]
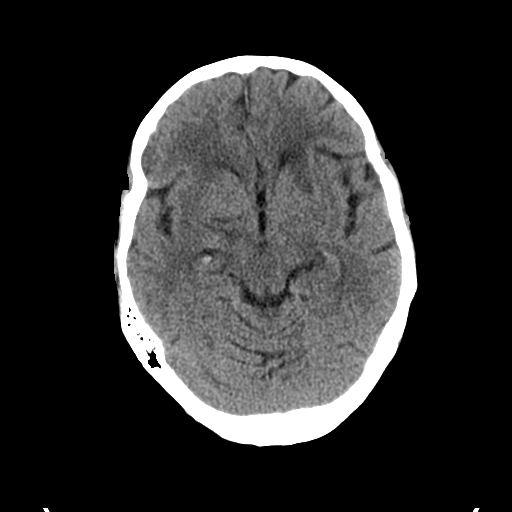
[im 17/33  brain]
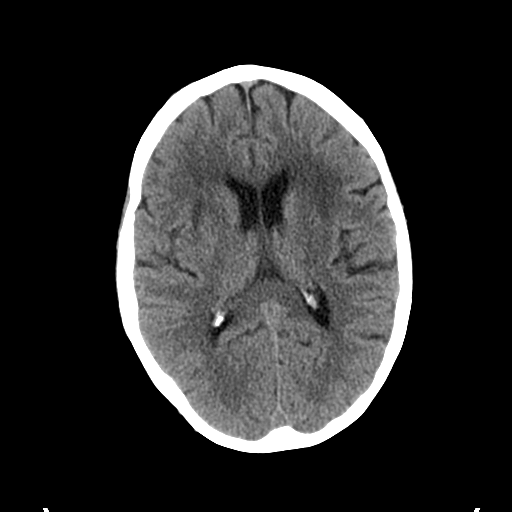
[im 17/33  bone]
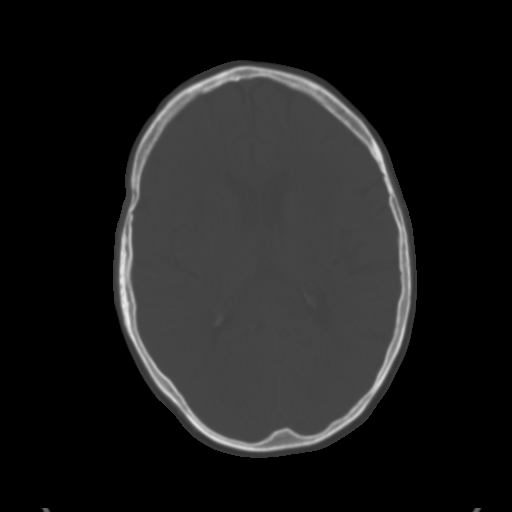
[im 20/33  brain]
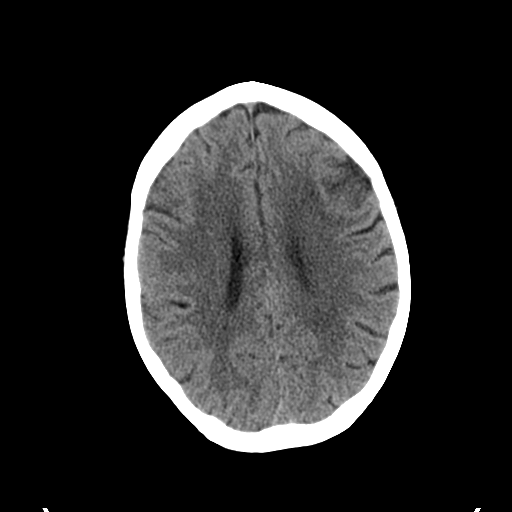
[im 24/33  brain]
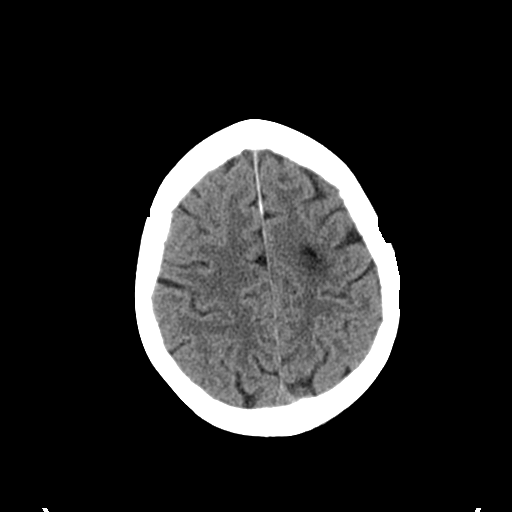
[im 27/33  brain]
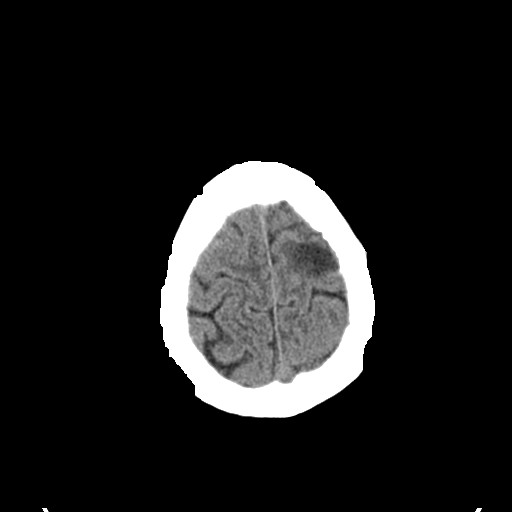
[im 30/33  brain]
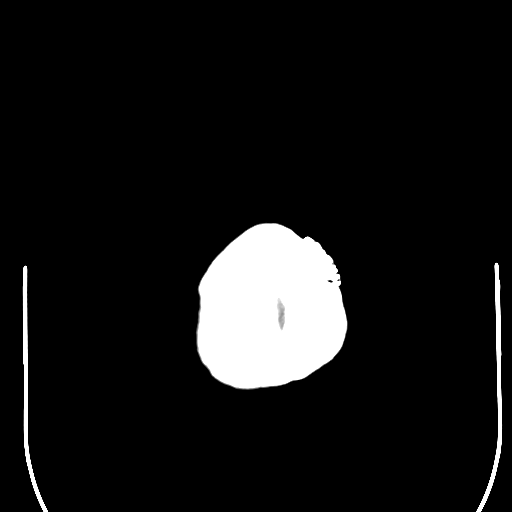
[im 30/33  bone]
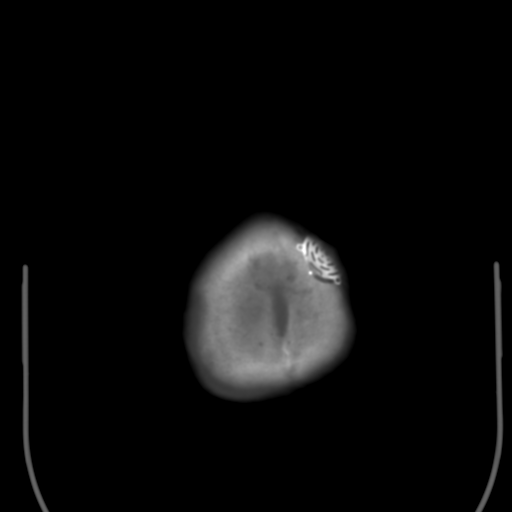

[Series 4: coronal soft tissue · coronal · 0.33mm/px · 3 of 79 slices shown]
[im 27/79  brain]
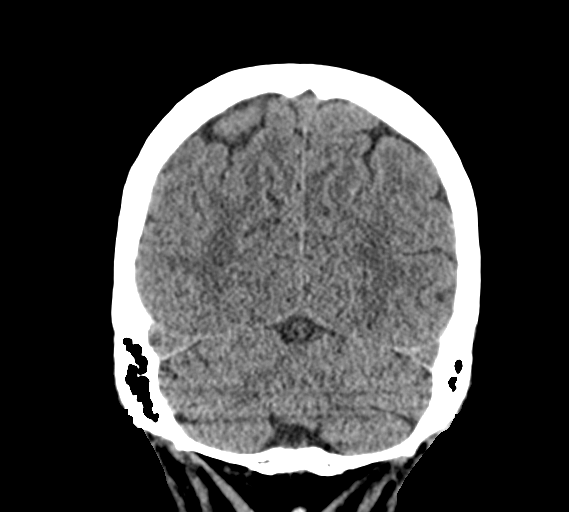
[im 35/79  brain]
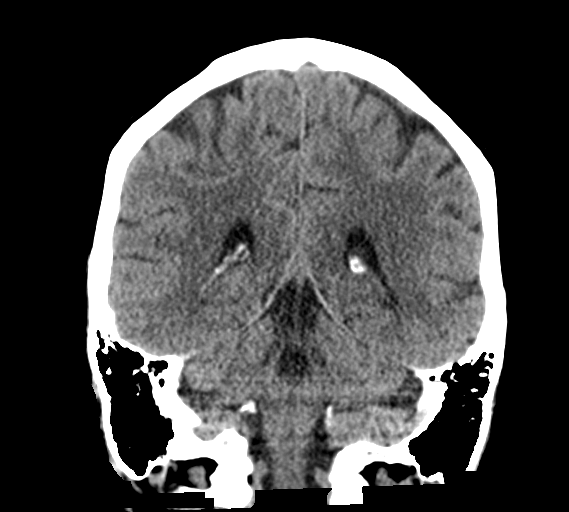
[im 44/79  brain]
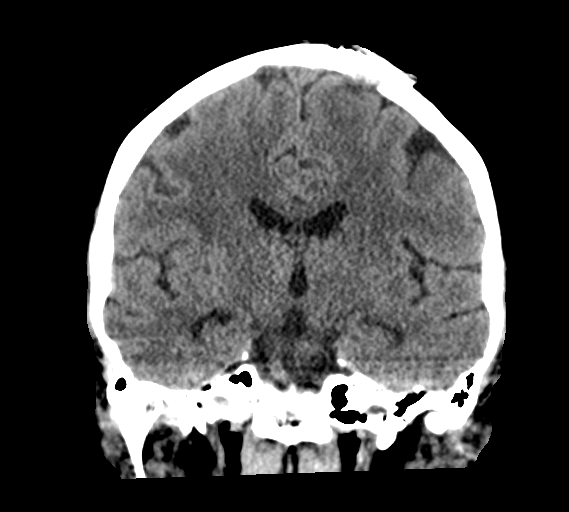

[Series 5: sagittal soft tissue · sagittal · 0.33mm/px · 3 of 54 slices shown]
[im 18/54  brain]
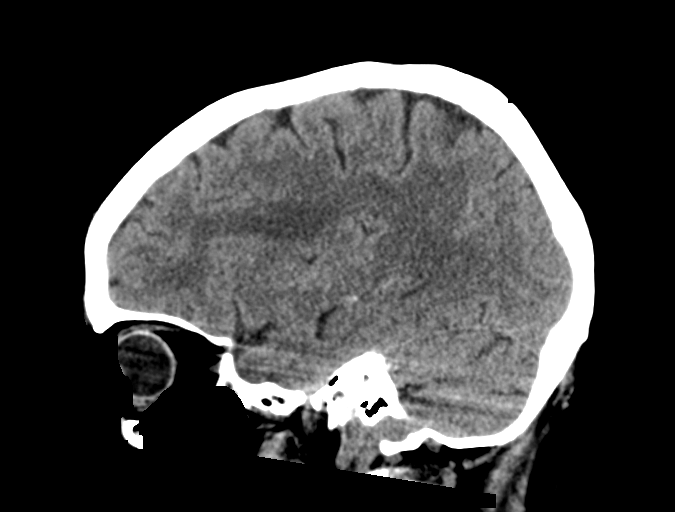
[im 27/54  brain]
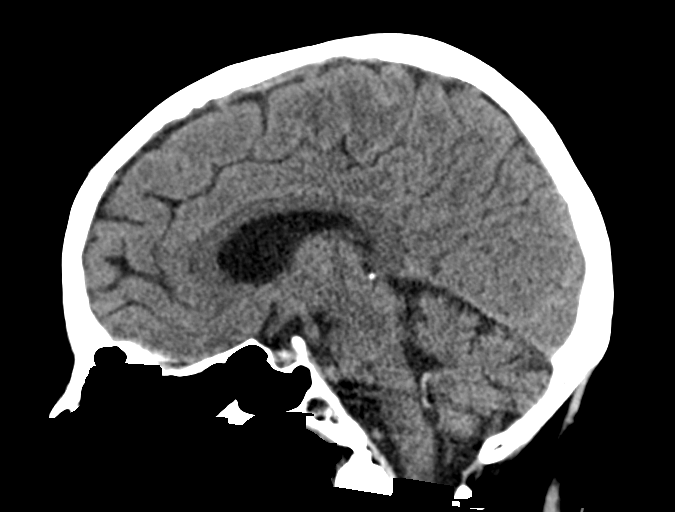
[im 36/54  brain]
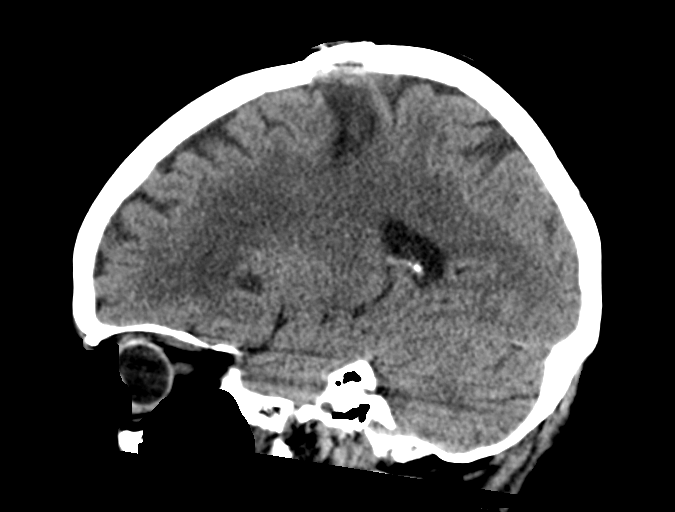

[15 of 47 positions shown; findings below may reference images not displayed]

FINDINGS: Brain: No acute or traumatic finding. Chronic small-vessel ischemic
changes affect the pons. No focal cerebellar finding. Cerebral
hemispheres show old small vessel infarctions of the thalami, basal
ganglia left more than right and hemispheric deep white matter.
Previous left frontal vertex craniotomy for tumor resection. No sign
of increasing mass, edema or hemorrhage in that region. No
hydrocephalus or extra-axial collection.

Vascular: There is atherosclerotic calcification of the major
vessels at the base of the brain.

Skull: No acute or traumatic finding.

Sinuses/Orbits: Clear/normal

Other: None
IMPRESSION: No acute or traumatic finding. Chronic small-vessel ischemic changes
throughout the brain as noted above.

Previous left frontal vertex craniotomy for tumor resection. No
unexpected finding in that location by CT.

## 2021-09-16 IMAGING — US US EXTREM LOW VENOUS*R*
1 series · 13 of 24 positions shown · non-contrast
Comparison: None.

CLINICAL DATA: History of DVT, currently on anticoagulation.
Evaluate for acute or chronic DVT.



[Series 1: us extrem low venous*right* · 13 of 33 slices shown]
[im 1/33]
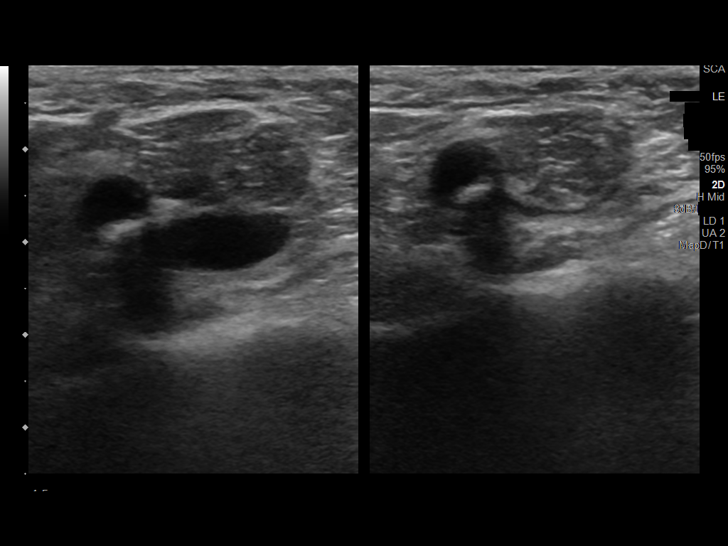
[im 3/33]
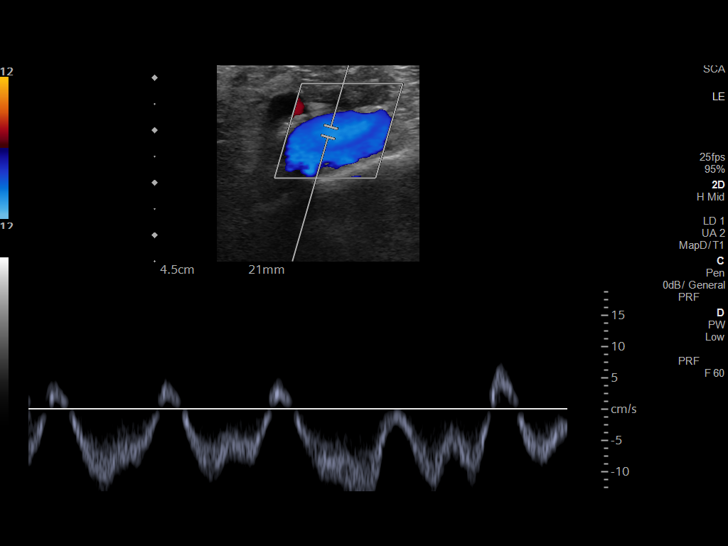
[im 6/33]
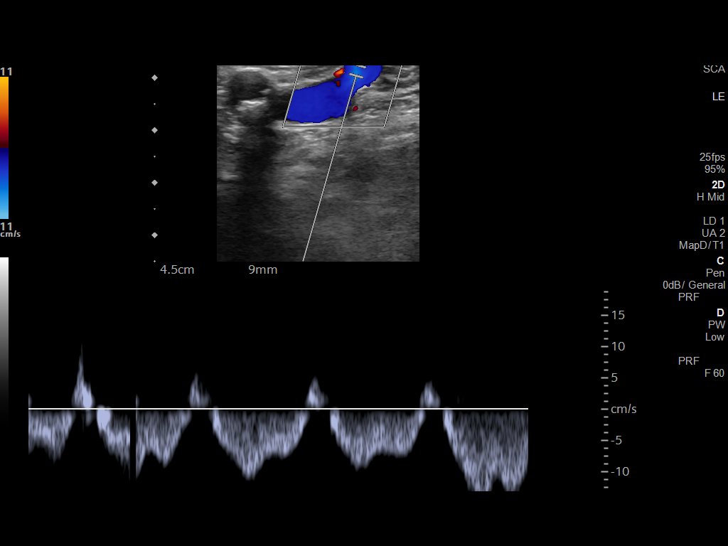
[im 9/33]
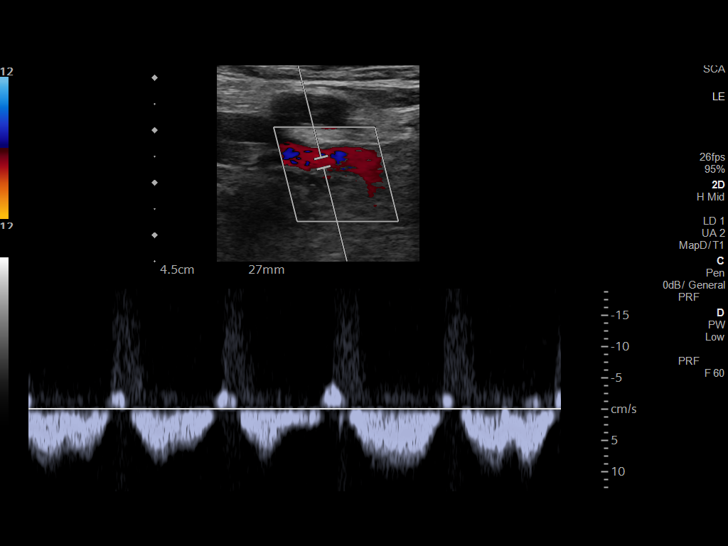
[im 12/33]
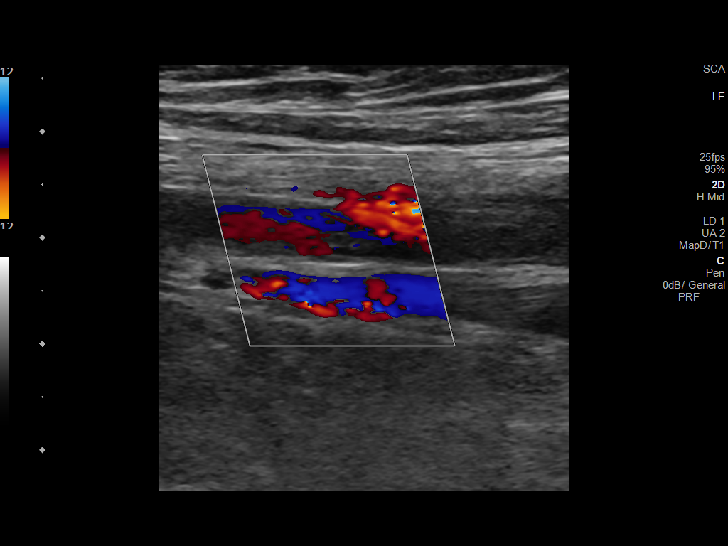
[im 14/33]
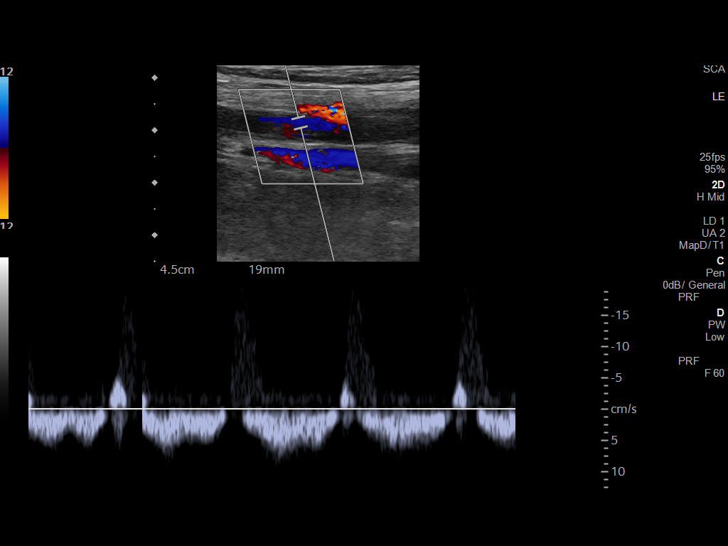
[im 17/33]
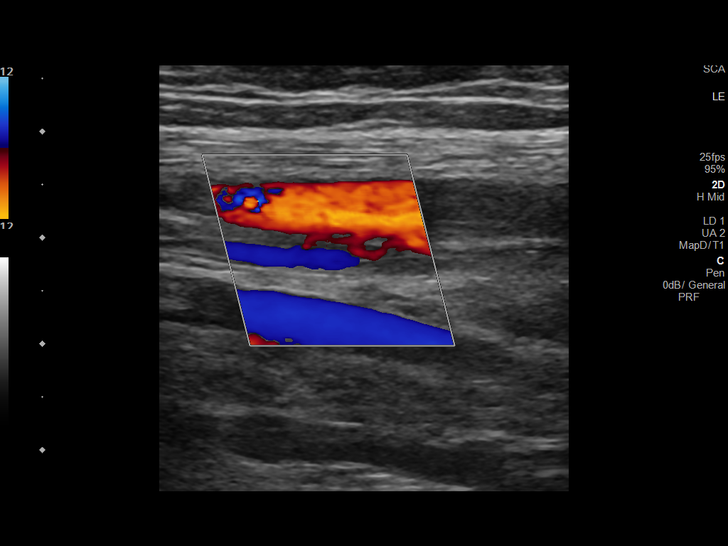
[im 19/33]
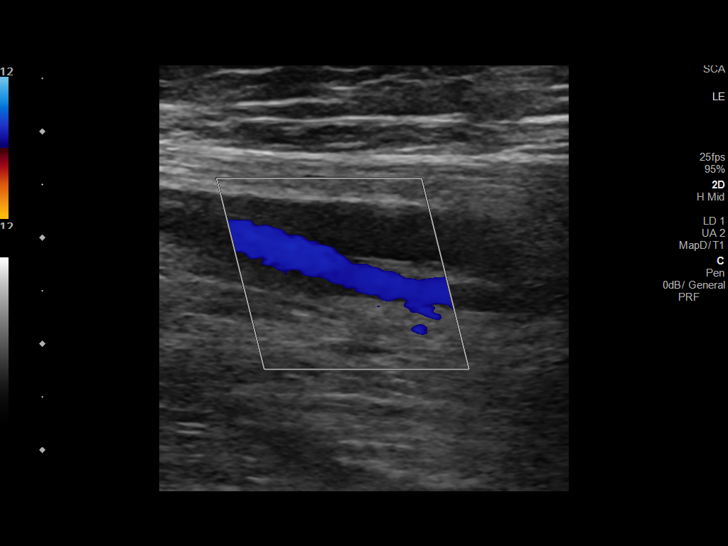
[im 21/33]
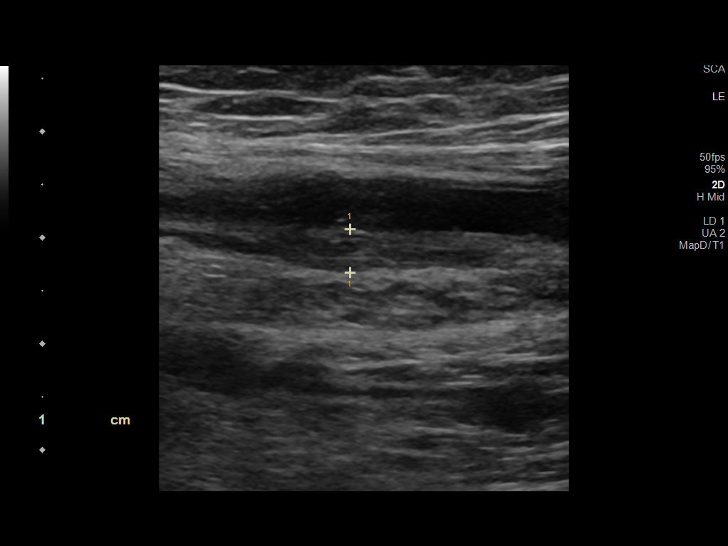
[im 24/33]
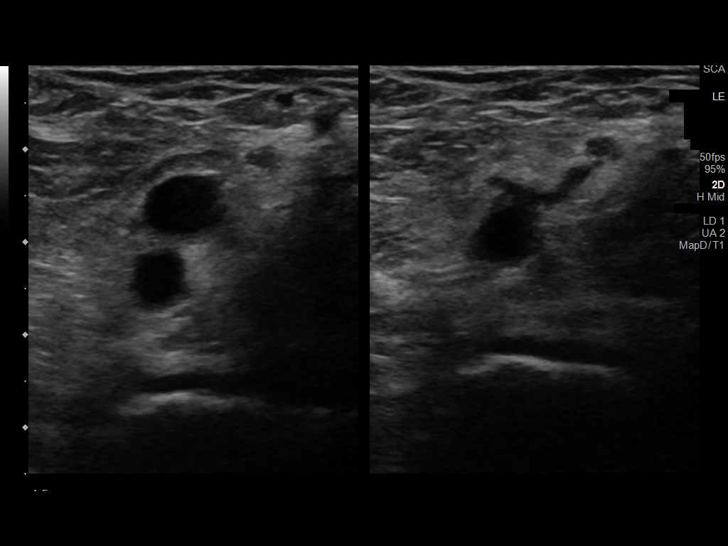
[im 27/33]
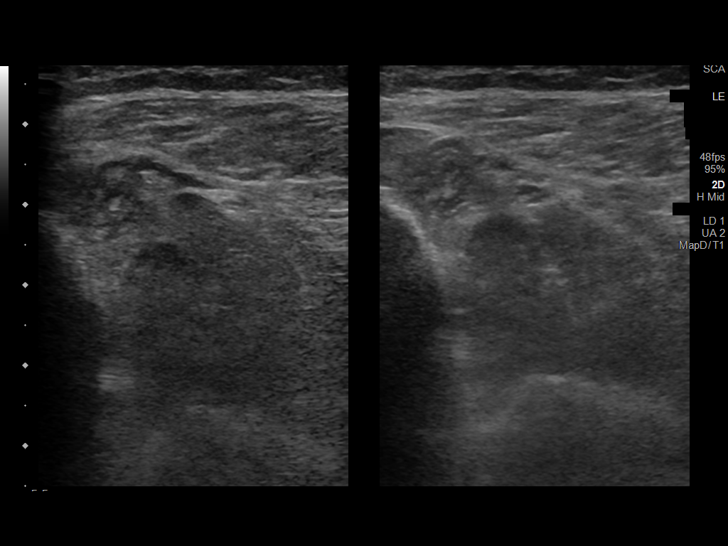
[im 30/33]
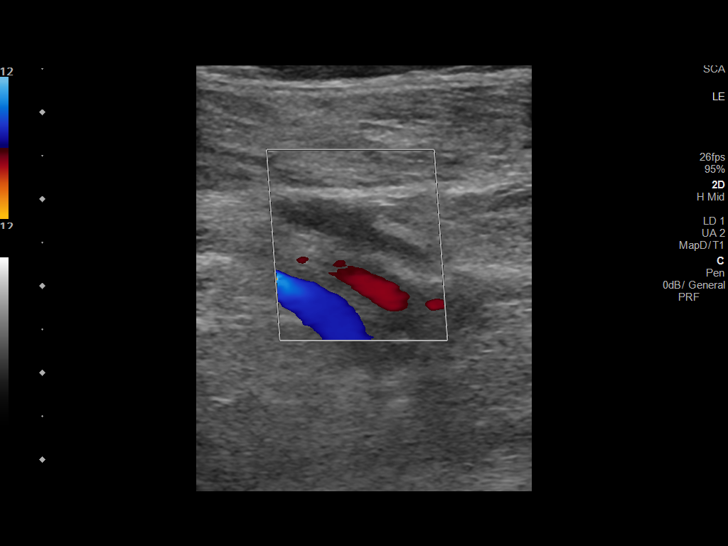
[im 33/33]
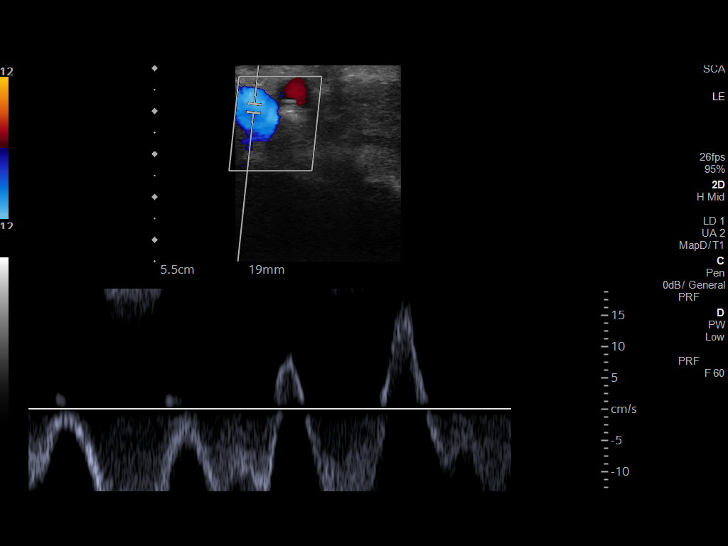

[13 of 24 positions shown; findings below may reference images not displayed]

FINDINGS: Contralateral Common Femoral Vein: Respiratory phasicity is normal
and symmetric with the symptomatic side. No evidence of thrombus.
Normal compressibility.

Common Femoral Vein: No evidence of thrombus. Normal
compressibility, respiratory phasicity and response to augmentation.

Saphenofemoral Junction: No evidence of thrombus. Normal
compressibility and flow on color Doppler imaging.

Profunda Femoral Vein: No evidence of thrombus. Normal
compressibility and flow on color Doppler imaging.

Femoral Vein: There is mixed echogenic nonocclusive wall
thickening/chronic DVT involving the proximal (image 13), mid (image
19) and distal (image 24) aspects of the right superficial femoral
vein.

Popliteal Vein: No evidence of acute or chronic thrombus. Normal
compressibility, respiratory phasicity and response to augmentation.

Calf Veins: There is hypoechoic occlusive thrombus within the right
peroneal vein (image 30). The right posterior tibial vein appears
patent where imaged.

Superficial Great Saphenous Vein: No evidence of thrombus. Normal
compressibility.

Venous Reflux:  None.

Other Findings: Incidentally noted scattered bilateral
atherosclerotic plaque.
IMPRESSION: The examination is positive for age-indeterminate, though given
provided history, presumably chronic nonocclusive DVT involving the
right femoral vein as well as occlusive DVT involving the right
peroneal vein.
